# Patient Record
Sex: Female | Born: 1935 | Race: White | Hispanic: No | Smoking: Former smoker
Health system: Southern US, Community
[De-identification: ages and names within clinical notes are randomized; demographics above are authoritative.]

## PROBLEM LIST (undated history)

## (undated) DIAGNOSIS — Z9221 Personal history of antineoplastic chemotherapy: Secondary | ICD-10-CM

## (undated) DIAGNOSIS — Z8719 Personal history of other diseases of the digestive system: Secondary | ICD-10-CM

## (undated) DIAGNOSIS — K644 Residual hemorrhoidal skin tags: Secondary | ICD-10-CM

## (undated) DIAGNOSIS — J302 Other seasonal allergic rhinitis: Secondary | ICD-10-CM

## (undated) DIAGNOSIS — C50919 Malignant neoplasm of unspecified site of unspecified female breast: Secondary | ICD-10-CM

## (undated) DIAGNOSIS — N393 Stress incontinence (female) (male): Secondary | ICD-10-CM

## (undated) DIAGNOSIS — M419 Scoliosis, unspecified: Secondary | ICD-10-CM

## (undated) DIAGNOSIS — W57XXXA Bitten or stung by nonvenomous insect and other nonvenomous arthropods, initial encounter: Secondary | ICD-10-CM

## (undated) DIAGNOSIS — K648 Other hemorrhoids: Secondary | ICD-10-CM

## (undated) DIAGNOSIS — IMO0002 Reserved for concepts with insufficient information to code with codable children: Secondary | ICD-10-CM

## (undated) DIAGNOSIS — R002 Palpitations: Secondary | ICD-10-CM

## (undated) DIAGNOSIS — E7800 Pure hypercholesterolemia, unspecified: Secondary | ICD-10-CM

## (undated) DIAGNOSIS — Z923 Personal history of irradiation: Secondary | ICD-10-CM

## (undated) DIAGNOSIS — E039 Hypothyroidism, unspecified: Secondary | ICD-10-CM

## (undated) HISTORY — PX: BOWEL RESECTION: SHX1257

## (undated) HISTORY — PX: BLADDER SUSPENSION: SHX72

## (undated) HISTORY — PX: PORT-A-CATH REMOVAL: SHX5289

## (undated) HISTORY — DX: Other hemorrhoids: K64.8

## (undated) HISTORY — PX: OTHER SURGICAL HISTORY: SHX169

## (undated) HISTORY — DX: Residual hemorrhoidal skin tags: K64.4

## (undated) HISTORY — PX: HEMORRHOID BANDING: SHX5850

## (undated) HISTORY — DX: Other seasonal allergic rhinitis: J30.2

## (undated) HISTORY — PX: COLON SURGERY: SHX602

## (undated) HISTORY — DX: Malignant neoplasm of unspecified site of unspecified female breast: C50.919

## (undated) HISTORY — DX: Palpitations: R00.2

## (undated) HISTORY — PX: EYE SURGERY: SHX253

## (undated) HISTORY — DX: Scoliosis, unspecified: M41.9

## (undated) HISTORY — DX: Pure hypercholesterolemia, unspecified: E78.00

## (undated) HISTORY — DX: Stress incontinence (female) (male): N39.3

## (undated) HISTORY — DX: Reserved for concepts with insufficient information to code with codable children: IMO0002

## (undated) HISTORY — PX: CATARACT EXTRACTION: SUR2

## (undated) HISTORY — PX: JOINT REPLACEMENT: SHX530

## (undated) HISTORY — PX: HERNIA REPAIR: SHX51

## (undated) HISTORY — DX: Personal history of other diseases of the digestive system: Z87.19

---

## 1999-02-27 HISTORY — PX: TONSILLECTOMY: SUR1361

## 2008-03-10 ENCOUNTER — Ambulatory Visit (HOSPITAL_BASED_OUTPATIENT_CLINIC_OR_DEPARTMENT_OTHER): Admission: RE | Admit: 2008-03-10 | Discharge: 2008-03-10 | Payer: Self-pay | Admitting: Internal Medicine

## 2008-03-10 ENCOUNTER — Ambulatory Visit: Payer: Self-pay | Admitting: Diagnostic Radiology

## 2008-04-15 ENCOUNTER — Ambulatory Visit (HOSPITAL_BASED_OUTPATIENT_CLINIC_OR_DEPARTMENT_OTHER): Admission: RE | Admit: 2008-04-15 | Discharge: 2008-04-16 | Payer: Self-pay | Admitting: General Surgery

## 2008-09-13 ENCOUNTER — Ambulatory Visit (HOSPITAL_BASED_OUTPATIENT_CLINIC_OR_DEPARTMENT_OTHER): Admission: RE | Admit: 2008-09-13 | Discharge: 2008-09-13 | Payer: Self-pay | Admitting: Internal Medicine

## 2008-09-13 ENCOUNTER — Ambulatory Visit: Payer: Self-pay | Admitting: Diagnostic Radiology

## 2008-10-16 ENCOUNTER — Encounter: Admission: RE | Admit: 2008-10-16 | Discharge: 2008-10-16 | Payer: Self-pay | Admitting: Internal Medicine

## 2010-02-26 DIAGNOSIS — Z9221 Personal history of antineoplastic chemotherapy: Secondary | ICD-10-CM

## 2010-02-26 DIAGNOSIS — Z923 Personal history of irradiation: Secondary | ICD-10-CM

## 2010-02-26 HISTORY — DX: Personal history of irradiation: Z92.3

## 2010-02-26 HISTORY — DX: Personal history of antineoplastic chemotherapy: Z92.21

## 2010-02-26 HISTORY — PX: BREAST LUMPECTOMY: SHX2

## 2010-03-14 ENCOUNTER — Other Ambulatory Visit: Payer: Self-pay | Admitting: Radiology

## 2010-03-14 ENCOUNTER — Encounter
Admission: RE | Admit: 2010-03-14 | Discharge: 2010-03-14 | Payer: Self-pay | Source: Home / Self Care | Attending: General Surgery | Admitting: General Surgery

## 2010-03-14 HISTORY — PX: BREAST BIOPSY: SHX20

## 2010-03-17 ENCOUNTER — Ambulatory Visit: Payer: Self-pay | Admitting: Oncology

## 2010-03-20 ENCOUNTER — Encounter
Admission: RE | Admit: 2010-03-20 | Discharge: 2010-03-20 | Payer: Self-pay | Source: Home / Self Care | Attending: General Surgery | Admitting: General Surgery

## 2010-03-22 ENCOUNTER — Other Ambulatory Visit: Payer: Self-pay | Admitting: Oncology

## 2010-03-22 DIAGNOSIS — C50919 Malignant neoplasm of unspecified site of unspecified female breast: Secondary | ICD-10-CM

## 2010-03-22 LAB — CBC WITH DIFFERENTIAL/PLATELET
BASO%: 0.7 % (ref 0.0–2.0)
Basophils Absolute: 0 10*3/uL (ref 0.0–0.1)
EOS%: 3.3 % (ref 0.0–7.0)
Eosinophils Absolute: 0.2 10*3/uL (ref 0.0–0.5)
HCT: 43.1 % (ref 34.8–46.6)
HGB: 14.6 g/dL (ref 11.6–15.9)
LYMPH%: 36.9 % (ref 14.0–49.7)
MCH: 31.6 pg (ref 25.1–34.0)
MCHC: 33.8 g/dL (ref 31.5–36.0)
MCV: 93.4 fL (ref 79.5–101.0)
MONO#: 0.5 10*3/uL (ref 0.1–0.9)
MONO%: 7.4 % (ref 0.0–14.0)
NEUT#: 3.8 10*3/uL (ref 1.5–6.5)
NEUT%: 51.7 % (ref 38.4–76.8)
Platelets: 231 10*3/uL (ref 145–400)
RBC: 4.62 10*6/uL (ref 3.70–5.45)
RDW: 13.4 % (ref 11.2–14.5)
WBC: 7.4 10*3/uL (ref 3.9–10.3)
lymph#: 2.7 10*3/uL (ref 0.9–3.3)

## 2010-03-22 LAB — COMPREHENSIVE METABOLIC PANEL
ALT: 23 U/L (ref 0–35)
AST: 22 U/L (ref 0–37)
Albumin: 4.1 g/dL (ref 3.5–5.2)
Alkaline Phosphatase: 32 U/L — ABNORMAL LOW (ref 39–117)
BUN: 20 mg/dL (ref 6–23)
CO2: 27 mEq/L (ref 19–32)
Calcium: 10 mg/dL (ref 8.4–10.5)
Chloride: 101 mEq/L (ref 96–112)
Creatinine, Ser: 0.78 mg/dL (ref 0.40–1.20)
Glucose, Bld: 78 mg/dL (ref 70–99)
Potassium: 4 mEq/L (ref 3.5–5.3)
Sodium: 140 mEq/L (ref 135–145)
Total Bilirubin: 0.8 mg/dL (ref 0.3–1.2)
Total Protein: 6.6 g/dL (ref 6.0–8.3)

## 2010-03-24 ENCOUNTER — Encounter
Admission: RE | Admit: 2010-03-24 | Discharge: 2010-03-24 | Payer: Self-pay | Source: Home / Self Care | Attending: Oncology | Admitting: Oncology

## 2010-03-24 ENCOUNTER — Other Ambulatory Visit: Payer: Self-pay | Admitting: Diagnostic Radiology

## 2010-03-27 ENCOUNTER — Ambulatory Visit (HOSPITAL_COMMUNITY)
Admission: RE | Admit: 2010-03-27 | Discharge: 2010-03-27 | Payer: Self-pay | Source: Home / Self Care | Attending: General Surgery | Admitting: General Surgery

## 2010-03-28 NOTE — Op Note (Signed)
Alice [name], Alice [name]               ACCOUNT NO.:  [id number]  MEDICAL RECORD NO.:  [id number]          PATIENT TYPE:  AMB  LOCATION:  DAY                          FACILITY:  [facility]  PHYSICIAN:  Sharlet Salina T. Hoxworth, M.D.DATE OF BIRTH:  1930/10/16  DATE OF PROCEDURE:  03/27/2010 DATE OF DISCHARGE:                              OPERATIVE REPORT   PREOPERATIVE DIAGNOSIS:  Cancer of the right breast.  POSTOPERATIVE DIAGNOSIS:  Cancer of the right breast.  SURGICAL PROCEDURE:  Placement of PowerPort subcutaneous venous port via right internal jugular vein.  SURGEON:  Lorne Skeens. Hoxworth, M.D.  ANESTHESIA:  General.  BRIEF HISTORY:  Alice [name] is a 77 year old female with a recent diagnosis of locally advanced cancer of the right breast.  She will require long-term venous access for chemotherapy.  We have recommended placement of a subcutaneous venous port.  The nature of the procedure, its indications, risks of anesthetic complications, bleeding, infection, pneumothorax, catheter displacement and malfunction have been discussed and understood.  She is now brought to the operating room for this procedure.  DESCRIPTION OF OPERATION:  The patient was brought to the operating room, placed in supine position on the operating table and IV sedation was administered.  She was carefully positioned with her arms tucked and roll between her shoulder blades.  The entire chest, both shoulders and neck were widely sterilely prepped and draped.  She received preoperative IV antibiotics.  Correct patient and procedure were verified.  I initially approached the left subclavian vein and was able to cannulate the vein with good blood return, and the wire was placed but only threaded about 6 or 7 cm, and I could not get the wire to thread past this.  I therefore elected to use the left internal jugular vein which was also cannulated without difficulty with the needle and the wire passed but again  went far enough to go in the vein but only was threading about 6 or 7 cm.  I wished to place it on the left side possibly due to patient's right-sided breast cancer, and I elected to do a small cutdown on the left cephalic vein at the deltopectoral groove. This area was anesthetized and a small incision made in the skin crease and careful dissection was carried down to the deltopectoral groove where a nice normal cephalic vein was dissected free and controlled between two 4-0 silk ties.  A venotomy was made.  The vein introducer placed and the flushed catheter introduced without difficulty.  However, it would thread about 8-10 cm and would not thread past this point. There appeared to be at least some sort of functional obstruction on the left side.  I therefore elected to go to the right side.  The right anterior neck was anesthetized and the right internal jugular vein cannulated with needle and wire without difficulty, confirmed by fluoroscopy.  Following this, the introducer was placed over the wire and the flushed catheter placed via the introducer which was stripped away and the tip of the catheter positioned in the superior vena cava by fluoroscopy.  A site on the right anterior chest wall staying  superomedially as possible was chosen and anesthetized.  A small transverse incision made and subcutaneous pocket created.  The catheter was then tunneled subcutaneously to the pocket, trimmed to length and attached to the port which was sutured to the anterior chest wall with interrupted Prolene.  Fluoroscopy again showed good position of the tip of the catheter at the SVC atrial junction.  Following this, the subcu at the port site was closed with interrupted Monocryl.  Skin incisions were closed with subcuticular Monocryl and Dermabond.  Sponge and instrument counts correct.  The catheter was accessed, flushed and aspirated easily and was left flushed with concentrated heparin.  The  patient was taken to recovery room in good condition.     Lorne Skeens. Hoxworth, M.D.     Tory Emerald  D:  03/27/2010  T:  03/27/2010  Job:  [id number]  Electronically Signed by Glenna Fellows M.D. on 03/28/2010 09:18:38 AM

## 2010-03-29 ENCOUNTER — Encounter: Payer: Self-pay | Admitting: Internal Medicine

## 2010-03-30 ENCOUNTER — Ambulatory Visit (HOSPITAL_COMMUNITY)
Admission: RE | Admit: 2010-03-30 | Discharge: 2010-03-30 | Disposition: A | Discharge status: Home / Self Care | Payer: Medicare Other | Source: Ambulatory Visit | Attending: Oncology | Admitting: Oncology

## 2010-03-30 ENCOUNTER — Encounter (HOSPITAL_COMMUNITY): Payer: Self-pay

## 2010-03-30 DIAGNOSIS — C50919 Malignant neoplasm of unspecified site of unspecified female breast: Secondary | ICD-10-CM

## 2010-03-30 DIAGNOSIS — K7689 Other specified diseases of liver: Secondary | ICD-10-CM | POA: Insufficient documentation

## 2010-03-30 LAB — GLUCOSE, CAPILLARY: Glucose-Capillary: 100 mg/dL — ABNORMAL HIGH (ref 70–99)

## 2010-03-30 MED ORDER — FLUDEOXYGLUCOSE F - 18 (FDG) INJECTION: 17.8000 | Freq: Once | INTRAVENOUS | Status: DC | PRN

## 2010-03-30 MED ORDER — IOHEXOL 300 MG/ML  SOLN
80.0000 mL | Freq: Once | INTRAMUSCULAR | Status: AC | PRN
  Administered 2010-03-30: 80 mL via INTRAVENOUS

## 2010-04-10 ENCOUNTER — Encounter (HOSPITAL_BASED_OUTPATIENT_CLINIC_OR_DEPARTMENT_OTHER): Payer: Medicare Other | Admitting: Oncology

## 2010-04-10 DIAGNOSIS — C50919 Malignant neoplasm of unspecified site of unspecified female breast: Secondary | ICD-10-CM

## 2010-04-20 ENCOUNTER — Encounter (HOSPITAL_BASED_OUTPATIENT_CLINIC_OR_DEPARTMENT_OTHER): Payer: Medicare Other | Admitting: Oncology

## 2010-04-20 DIAGNOSIS — C50419 Malignant neoplasm of upper-outer quadrant of unspecified female breast: Secondary | ICD-10-CM

## 2010-04-20 DIAGNOSIS — Z5111 Encounter for antineoplastic chemotherapy: Secondary | ICD-10-CM

## 2010-04-20 DIAGNOSIS — Z170 Estrogen receptor positive status [ER+]: Secondary | ICD-10-CM

## 2010-04-21 ENCOUNTER — Encounter (HOSPITAL_BASED_OUTPATIENT_CLINIC_OR_DEPARTMENT_OTHER): Payer: Medicare Other | Admitting: Oncology

## 2010-04-21 DIAGNOSIS — C50919 Malignant neoplasm of unspecified site of unspecified female breast: Secondary | ICD-10-CM

## 2010-04-26 ENCOUNTER — Encounter (HOSPITAL_BASED_OUTPATIENT_CLINIC_OR_DEPARTMENT_OTHER): Payer: Medicare Other | Admitting: Oncology

## 2010-04-26 ENCOUNTER — Other Ambulatory Visit: Payer: Self-pay | Admitting: Oncology

## 2010-04-26 DIAGNOSIS — C50919 Malignant neoplasm of unspecified site of unspecified female breast: Secondary | ICD-10-CM

## 2010-04-26 LAB — CBC WITH DIFFERENTIAL/PLATELET
BASO%: 0.8 % (ref 0.0–2.0)
EOS%: 12.6 % — ABNORMAL HIGH (ref 0.0–7.0)
HCT: 38.6 % (ref 34.8–46.6)
MCH: 32.1 pg (ref 25.1–34.0)
MCHC: 34.5 g/dL (ref 31.5–36.0)
NEUT%: 17.7 % — ABNORMAL LOW (ref 38.4–76.8)
RBC: 4.15 10*6/uL (ref 3.70–5.45)
RDW: 12.8 % (ref 11.2–14.5)
lymph#: 0.9 10*3/uL (ref 0.9–3.3)

## 2010-05-03 ENCOUNTER — Other Ambulatory Visit: Payer: Self-pay | Admitting: Oncology

## 2010-05-03 ENCOUNTER — Encounter (HOSPITAL_BASED_OUTPATIENT_CLINIC_OR_DEPARTMENT_OTHER): Payer: Medicare Other | Admitting: Oncology

## 2010-05-03 DIAGNOSIS — C50919 Malignant neoplasm of unspecified site of unspecified female breast: Secondary | ICD-10-CM

## 2010-05-03 DIAGNOSIS — C50419 Malignant neoplasm of upper-outer quadrant of unspecified female breast: Secondary | ICD-10-CM

## 2010-05-03 DIAGNOSIS — Z170 Estrogen receptor positive status [ER+]: Secondary | ICD-10-CM

## 2010-05-03 LAB — CBC WITH DIFFERENTIAL/PLATELET
BASO%: 0.1 % (ref 0.0–2.0)
Basophils Absolute: 0 10*3/uL (ref 0.0–0.1)
EOS%: 1.2 % (ref 0.0–7.0)
HGB: 13 g/dL (ref 11.6–15.9)
MCH: 31.4 pg (ref 25.1–34.0)
RDW: 13.1 % (ref 11.2–14.5)
WBC: 12.5 10*3/uL — ABNORMAL HIGH (ref 3.9–10.3)
lymph#: 1.7 10*3/uL (ref 0.9–3.3)

## 2010-05-04 ENCOUNTER — Encounter (HOSPITAL_BASED_OUTPATIENT_CLINIC_OR_DEPARTMENT_OTHER): Payer: Medicare Other | Admitting: Oncology

## 2010-05-04 DIAGNOSIS — Z5111 Encounter for antineoplastic chemotherapy: Secondary | ICD-10-CM

## 2010-05-04 DIAGNOSIS — C50419 Malignant neoplasm of upper-outer quadrant of unspecified female breast: Secondary | ICD-10-CM

## 2010-05-04 DIAGNOSIS — Z170 Estrogen receptor positive status [ER+]: Secondary | ICD-10-CM

## 2010-05-05 ENCOUNTER — Encounter (HOSPITAL_BASED_OUTPATIENT_CLINIC_OR_DEPARTMENT_OTHER): Payer: Medicare Other | Admitting: Oncology

## 2010-05-05 DIAGNOSIS — R5381 Other malaise: Secondary | ICD-10-CM

## 2010-05-05 DIAGNOSIS — C50419 Malignant neoplasm of upper-outer quadrant of unspecified female breast: Secondary | ICD-10-CM

## 2010-05-05 DIAGNOSIS — R110 Nausea: Secondary | ICD-10-CM

## 2010-05-11 ENCOUNTER — Encounter (HOSPITAL_BASED_OUTPATIENT_CLINIC_OR_DEPARTMENT_OTHER): Payer: Medicare Other | Admitting: Oncology

## 2010-05-11 ENCOUNTER — Other Ambulatory Visit: Payer: Self-pay | Admitting: Oncology

## 2010-05-11 DIAGNOSIS — Z5111 Encounter for antineoplastic chemotherapy: Secondary | ICD-10-CM

## 2010-05-11 DIAGNOSIS — C50919 Malignant neoplasm of unspecified site of unspecified female breast: Secondary | ICD-10-CM

## 2010-05-11 DIAGNOSIS — Z170 Estrogen receptor positive status [ER+]: Secondary | ICD-10-CM

## 2010-05-11 DIAGNOSIS — C50419 Malignant neoplasm of upper-outer quadrant of unspecified female breast: Secondary | ICD-10-CM

## 2010-05-11 LAB — CBC WITH DIFFERENTIAL/PLATELET
Basophils Absolute: 0 10*3/uL (ref 0.0–0.1)
Eosinophils Absolute: 0 10*3/uL (ref 0.0–0.5)
HGB: 12.4 g/dL (ref 11.6–15.9)
NEUT#: 0.3 10*3/uL — CL (ref 1.5–6.5)
RDW: 12.9 % (ref 11.2–14.5)
lymph#: 0.6 10*3/uL — ABNORMAL LOW (ref 0.9–3.3)

## 2010-05-18 ENCOUNTER — Encounter (HOSPITAL_BASED_OUTPATIENT_CLINIC_OR_DEPARTMENT_OTHER): Payer: Medicare Other | Admitting: Oncology

## 2010-05-18 ENCOUNTER — Other Ambulatory Visit: Payer: Self-pay | Admitting: Oncology

## 2010-05-18 DIAGNOSIS — Z170 Estrogen receptor positive status [ER+]: Secondary | ICD-10-CM

## 2010-05-18 DIAGNOSIS — Z5111 Encounter for antineoplastic chemotherapy: Secondary | ICD-10-CM

## 2010-05-18 DIAGNOSIS — C50919 Malignant neoplasm of unspecified site of unspecified female breast: Secondary | ICD-10-CM

## 2010-05-18 DIAGNOSIS — C50419 Malignant neoplasm of upper-outer quadrant of unspecified female breast: Secondary | ICD-10-CM

## 2010-05-18 LAB — CBC WITH DIFFERENTIAL/PLATELET
Basophils Absolute: 0.1 10*3/uL (ref 0.0–0.1)
Eosinophils Absolute: 0.1 10*3/uL (ref 0.0–0.5)
HCT: 34.6 % — ABNORMAL LOW (ref 34.8–46.6)
HGB: 11.9 g/dL (ref 11.6–15.9)
LYMPH%: 10.2 % — ABNORMAL LOW (ref 14.0–49.7)
MCV: 91.5 fL (ref 79.5–101.0)
MONO%: 4 % (ref 0.0–14.0)
NEUT#: 10.9 10*3/uL — ABNORMAL HIGH (ref 1.5–6.5)
Platelets: 214 10*3/uL (ref 145–400)

## 2010-05-19 ENCOUNTER — Encounter (HOSPITAL_BASED_OUTPATIENT_CLINIC_OR_DEPARTMENT_OTHER): Payer: Medicare Other | Admitting: Oncology

## 2010-05-19 DIAGNOSIS — Z170 Estrogen receptor positive status [ER+]: Secondary | ICD-10-CM

## 2010-05-19 DIAGNOSIS — C50419 Malignant neoplasm of upper-outer quadrant of unspecified female breast: Secondary | ICD-10-CM

## 2010-05-20 ENCOUNTER — Encounter (HOSPITAL_BASED_OUTPATIENT_CLINIC_OR_DEPARTMENT_OTHER): Payer: Medicare Other | Admitting: Oncology

## 2010-05-20 DIAGNOSIS — C50419 Malignant neoplasm of upper-outer quadrant of unspecified female breast: Secondary | ICD-10-CM

## 2010-05-20 DIAGNOSIS — Z170 Estrogen receptor positive status [ER+]: Secondary | ICD-10-CM

## 2010-05-21 ENCOUNTER — Encounter (HOSPITAL_BASED_OUTPATIENT_CLINIC_OR_DEPARTMENT_OTHER): Payer: Medicare Other | Admitting: Oncology

## 2010-05-21 DIAGNOSIS — C50419 Malignant neoplasm of upper-outer quadrant of unspecified female breast: Secondary | ICD-10-CM

## 2010-05-21 DIAGNOSIS — Z170 Estrogen receptor positive status [ER+]: Secondary | ICD-10-CM

## 2010-05-25 ENCOUNTER — Encounter (HOSPITAL_BASED_OUTPATIENT_CLINIC_OR_DEPARTMENT_OTHER): Payer: Medicare Other | Admitting: Oncology

## 2010-05-25 ENCOUNTER — Other Ambulatory Visit: Payer: Self-pay | Admitting: Oncology

## 2010-05-25 DIAGNOSIS — C50919 Malignant neoplasm of unspecified site of unspecified female breast: Secondary | ICD-10-CM

## 2010-05-25 DIAGNOSIS — C50419 Malignant neoplasm of upper-outer quadrant of unspecified female breast: Secondary | ICD-10-CM

## 2010-05-25 DIAGNOSIS — R5383 Other fatigue: Secondary | ICD-10-CM

## 2010-05-25 DIAGNOSIS — R110 Nausea: Secondary | ICD-10-CM

## 2010-05-25 DIAGNOSIS — R5381 Other malaise: Secondary | ICD-10-CM

## 2010-05-25 LAB — CBC WITH DIFFERENTIAL/PLATELET
Eosinophils Absolute: 0 10*3/uL (ref 0.0–0.5)
LYMPH%: 36.8 % (ref 14.0–49.7)
MCHC: 33.8 g/dL (ref 31.5–36.0)
MCV: 93.3 fL (ref 79.5–101.0)
MONO%: 3.7 % (ref 0.0–14.0)
NEUT#: 0.6 10*3/uL — ABNORMAL LOW (ref 1.5–6.5)
Platelets: 208 10*3/uL (ref 145–400)
RBC: 3.62 10*6/uL — ABNORMAL LOW (ref 3.70–5.45)

## 2010-05-31 ENCOUNTER — Encounter (HOSPITAL_BASED_OUTPATIENT_CLINIC_OR_DEPARTMENT_OTHER): Payer: Medicare Other | Admitting: Oncology

## 2010-05-31 DIAGNOSIS — C50419 Malignant neoplasm of upper-outer quadrant of unspecified female breast: Secondary | ICD-10-CM

## 2010-05-31 DIAGNOSIS — Z5111 Encounter for antineoplastic chemotherapy: Secondary | ICD-10-CM

## 2010-05-31 DIAGNOSIS — Z170 Estrogen receptor positive status [ER+]: Secondary | ICD-10-CM

## 2010-05-31 DIAGNOSIS — C50919 Malignant neoplasm of unspecified site of unspecified female breast: Secondary | ICD-10-CM

## 2010-06-01 ENCOUNTER — Other Ambulatory Visit: Payer: Self-pay | Admitting: Oncology

## 2010-06-01 ENCOUNTER — Encounter (HOSPITAL_BASED_OUTPATIENT_CLINIC_OR_DEPARTMENT_OTHER): Payer: Medicare Other | Admitting: Oncology

## 2010-06-01 DIAGNOSIS — Z5189 Encounter for other specified aftercare: Secondary | ICD-10-CM

## 2010-06-01 DIAGNOSIS — C50419 Malignant neoplasm of upper-outer quadrant of unspecified female breast: Secondary | ICD-10-CM

## 2010-06-01 DIAGNOSIS — Z170 Estrogen receptor positive status [ER+]: Secondary | ICD-10-CM

## 2010-06-01 DIAGNOSIS — C50919 Malignant neoplasm of unspecified site of unspecified female breast: Secondary | ICD-10-CM

## 2010-06-01 DIAGNOSIS — Z5111 Encounter for antineoplastic chemotherapy: Secondary | ICD-10-CM

## 2010-06-01 LAB — CBC WITH DIFFERENTIAL/PLATELET
BASO%: 3.9 % — ABNORMAL HIGH (ref 0.0–2.0)
EOS%: 2.8 % (ref 0.0–7.0)
LYMPH%: 37.4 % (ref 14.0–49.7)
MCH: 30.4 pg (ref 25.1–34.0)
MCHC: 34 g/dL (ref 31.5–36.0)
MONO#: 0.9 10*3/uL (ref 0.1–0.9)
MONO%: 35 % — ABNORMAL HIGH (ref 0.0–14.0)
Platelets: 189 10*3/uL (ref 145–400)
RBC: 3.65 10*6/uL — ABNORMAL LOW (ref 3.70–5.45)
WBC: 2.5 10*3/uL — ABNORMAL LOW (ref 3.9–10.3)
nRBC: 0 % (ref 0–0)

## 2010-06-02 ENCOUNTER — Encounter (HOSPITAL_BASED_OUTPATIENT_CLINIC_OR_DEPARTMENT_OTHER): Payer: Medicare Other | Admitting: Oncology

## 2010-06-02 ENCOUNTER — Other Ambulatory Visit: Payer: Self-pay | Admitting: Oncology

## 2010-06-02 DIAGNOSIS — Z170 Estrogen receptor positive status [ER+]: Secondary | ICD-10-CM

## 2010-06-02 DIAGNOSIS — C50419 Malignant neoplasm of upper-outer quadrant of unspecified female breast: Secondary | ICD-10-CM

## 2010-06-02 DIAGNOSIS — C50911 Malignant neoplasm of unspecified site of right female breast: Secondary | ICD-10-CM

## 2010-06-03 ENCOUNTER — Encounter: Payer: Medicare Other | Admitting: Oncology

## 2010-06-03 DIAGNOSIS — Z5189 Encounter for other specified aftercare: Secondary | ICD-10-CM

## 2010-06-08 ENCOUNTER — Encounter (HOSPITAL_BASED_OUTPATIENT_CLINIC_OR_DEPARTMENT_OTHER): Payer: Medicare Other | Admitting: Oncology

## 2010-06-08 ENCOUNTER — Other Ambulatory Visit: Payer: Self-pay | Admitting: Oncology

## 2010-06-08 DIAGNOSIS — C50919 Malignant neoplasm of unspecified site of unspecified female breast: Secondary | ICD-10-CM

## 2010-06-08 DIAGNOSIS — Z170 Estrogen receptor positive status [ER+]: Secondary | ICD-10-CM

## 2010-06-08 DIAGNOSIS — C50419 Malignant neoplasm of upper-outer quadrant of unspecified female breast: Secondary | ICD-10-CM

## 2010-06-08 DIAGNOSIS — Z5111 Encounter for antineoplastic chemotherapy: Secondary | ICD-10-CM

## 2010-06-08 LAB — CBC WITH DIFFERENTIAL/PLATELET
BASO%: 1 % (ref 0.0–2.0)
EOS%: 2.3 % (ref 0.0–7.0)
HGB: 12.8 g/dL (ref 11.6–15.9)
MCH: 30.8 pg (ref 25.1–34.0)
MCHC: 33.9 g/dL (ref 31.5–36.0)
MONO%: 16.2 % — ABNORMAL HIGH (ref 0.0–14.0)
RBC: 4.15 10*6/uL (ref 3.70–5.45)
RDW: 15.2 % — ABNORMAL HIGH (ref 11.2–14.5)
lymph#: 2.6 10*3/uL (ref 0.9–3.3)

## 2010-06-09 ENCOUNTER — Encounter (HOSPITAL_BASED_OUTPATIENT_CLINIC_OR_DEPARTMENT_OTHER): Payer: Medicare Other | Admitting: Oncology

## 2010-06-09 DIAGNOSIS — D702 Other drug-induced agranulocytosis: Secondary | ICD-10-CM

## 2010-06-09 DIAGNOSIS — C50419 Malignant neoplasm of upper-outer quadrant of unspecified female breast: Secondary | ICD-10-CM

## 2010-06-09 DIAGNOSIS — Z170 Estrogen receptor positive status [ER+]: Secondary | ICD-10-CM

## 2010-06-10 ENCOUNTER — Encounter (HOSPITAL_BASED_OUTPATIENT_CLINIC_OR_DEPARTMENT_OTHER): Payer: Medicare Other | Admitting: Oncology

## 2010-06-10 DIAGNOSIS — D702 Other drug-induced agranulocytosis: Secondary | ICD-10-CM

## 2010-06-10 DIAGNOSIS — C50419 Malignant neoplasm of upper-outer quadrant of unspecified female breast: Secondary | ICD-10-CM

## 2010-06-10 DIAGNOSIS — Z170 Estrogen receptor positive status [ER+]: Secondary | ICD-10-CM

## 2010-06-11 ENCOUNTER — Encounter (HOSPITAL_BASED_OUTPATIENT_CLINIC_OR_DEPARTMENT_OTHER): Payer: Medicare Other | Admitting: Oncology

## 2010-06-11 DIAGNOSIS — D702 Other drug-induced agranulocytosis: Secondary | ICD-10-CM

## 2010-06-12 ENCOUNTER — Ambulatory Visit
Admission: RE | Admit: 2010-06-12 | Discharge: 2010-06-12 | Disposition: A | Discharge status: Home / Self Care | Payer: Medicare Other | Source: Ambulatory Visit | Attending: Oncology | Admitting: Oncology

## 2010-06-12 DIAGNOSIS — C50911 Malignant neoplasm of unspecified site of right female breast: Secondary | ICD-10-CM

## 2010-06-12 MED ORDER — GADOBENATE DIMEGLUMINE 529 MG/ML IV SOLN
12.0000 mL | Freq: Once | INTRAVENOUS | Status: AC | PRN
  Administered 2010-06-12: 12 mL via INTRAVENOUS

## 2010-06-13 LAB — DIFFERENTIAL
Eosinophils Absolute: 0.1 10*3/uL (ref 0.0–0.7)
Eosinophils Relative: 2 % (ref 0–5)
Lymphocytes Relative: 30 % (ref 12–46)
Lymphs Abs: 2.6 10*3/uL (ref 0.7–4.0)
Monocytes Absolute: 0.4 10*3/uL (ref 0.1–1.0)

## 2010-06-13 LAB — CBC
Platelets: 206 10*3/uL (ref 150–400)
RBC: 4.42 MIL/uL (ref 3.87–5.11)
WBC: 8.7 10*3/uL (ref 4.0–10.5)

## 2010-06-13 LAB — COMPREHENSIVE METABOLIC PANEL
ALT: 18 U/L (ref 0–35)
AST: 18 U/L (ref 0–37)
Albumin: 4.1 g/dL (ref 3.5–5.2)
CO2: 28 mEq/L (ref 19–32)
Calcium: 9.7 mg/dL (ref 8.4–10.5)
Chloride: 103 mEq/L (ref 96–112)
GFR calc Af Amer: >60 mL/min (ref >60)
GFR calc non Af Amer: >60 mL/min (ref >60)
Sodium: 139 mEq/L (ref 135–145)
Total Bilirubin: 1 mg/dL (ref 0.3–1.2)

## 2010-06-14 ENCOUNTER — Other Ambulatory Visit: Payer: Self-pay | Admitting: Oncology

## 2010-06-14 ENCOUNTER — Encounter (HOSPITAL_BASED_OUTPATIENT_CLINIC_OR_DEPARTMENT_OTHER): Payer: Medicare Other | Admitting: Oncology

## 2010-06-14 DIAGNOSIS — C50419 Malignant neoplasm of upper-outer quadrant of unspecified female breast: Secondary | ICD-10-CM

## 2010-06-14 DIAGNOSIS — C50919 Malignant neoplasm of unspecified site of unspecified female breast: Secondary | ICD-10-CM

## 2010-06-14 DIAGNOSIS — Z5111 Encounter for antineoplastic chemotherapy: Secondary | ICD-10-CM

## 2010-06-14 DIAGNOSIS — Z170 Estrogen receptor positive status [ER+]: Secondary | ICD-10-CM

## 2010-06-14 LAB — CBC WITH DIFFERENTIAL/PLATELET
BASO%: 2.9 % — ABNORMAL HIGH (ref 0.0–2.0)
Basophils Absolute: 0.1 10e3/uL (ref 0.0–0.1)
EOS%: 4.5 % (ref 0.0–7.0)
Eosinophils Absolute: 0.1 10e3/uL (ref 0.0–0.5)
HCT: 34.1 % — ABNORMAL LOW (ref 34.8–46.6)
HGB: 11.5 g/dL — ABNORMAL LOW (ref 11.6–15.9)
LYMPH%: 21.7 % (ref 14.0–49.7)
MCH: 30.3 pg (ref 25.1–34.0)
MCHC: 33.7 g/dL (ref 31.5–36.0)
MCV: 89.7 fL (ref 79.5–101.0)
MONO#: 0 10e3/uL — ABNORMAL LOW (ref 0.1–0.9)
MONO%: 0.3 % (ref 0.0–14.0)
NEUT#: 2.2 10e3/uL (ref 1.5–6.5)
NEUT%: 70.6 % (ref 38.4–76.8)
Platelets: 99 10e3/uL — ABNORMAL LOW (ref 145–400)
RBC: 3.8 10e6/uL (ref 3.70–5.45)
RDW: 14.7 % — ABNORMAL HIGH (ref 11.2–14.5)
WBC: 3.1 10e3/uL — ABNORMAL LOW (ref 3.9–10.3)
lymph#: 0.7 10e3/uL — ABNORMAL LOW (ref 0.9–3.3)
nRBC: 0 % (ref 0–0)

## 2010-06-20 ENCOUNTER — Encounter (HOSPITAL_BASED_OUTPATIENT_CLINIC_OR_DEPARTMENT_OTHER): Payer: Medicare Other | Admitting: Oncology

## 2010-06-20 ENCOUNTER — Other Ambulatory Visit: Payer: Self-pay | Admitting: Oncology

## 2010-06-20 DIAGNOSIS — R110 Nausea: Secondary | ICD-10-CM

## 2010-06-20 DIAGNOSIS — C50919 Malignant neoplasm of unspecified site of unspecified female breast: Secondary | ICD-10-CM

## 2010-06-20 DIAGNOSIS — Z170 Estrogen receptor positive status [ER+]: Secondary | ICD-10-CM

## 2010-06-20 DIAGNOSIS — R5383 Other fatigue: Secondary | ICD-10-CM

## 2010-06-20 DIAGNOSIS — Z5111 Encounter for antineoplastic chemotherapy: Secondary | ICD-10-CM

## 2010-06-20 DIAGNOSIS — R1013 Epigastric pain: Secondary | ICD-10-CM

## 2010-06-20 DIAGNOSIS — C50419 Malignant neoplasm of upper-outer quadrant of unspecified female breast: Secondary | ICD-10-CM

## 2010-06-20 LAB — CBC WITH DIFFERENTIAL/PLATELET
BASO%: 3.5 % — ABNORMAL HIGH (ref 0.0–2.0)
Basophils Absolute: 0.1 10*3/uL (ref 0.0–0.1)
EOS%: 11.3 % — ABNORMAL HIGH (ref 0.0–7.0)
MCH: 30.2 pg (ref 25.1–34.0)
MCHC: 33.7 g/dL (ref 31.5–36.0)
MCV: 89.6 fL (ref 79.5–101.0)
MONO%: 18.3 % — ABNORMAL HIGH (ref 0.0–14.0)
RBC: 3.64 10*6/uL — ABNORMAL LOW (ref 3.70–5.45)
RDW: 14.7 % — ABNORMAL HIGH (ref 11.2–14.5)
lymph#: 0.7 10*3/uL — ABNORMAL LOW (ref 0.9–3.3)

## 2010-06-22 ENCOUNTER — Encounter (HOSPITAL_BASED_OUTPATIENT_CLINIC_OR_DEPARTMENT_OTHER): Payer: Medicare Other | Admitting: Oncology

## 2010-06-22 ENCOUNTER — Other Ambulatory Visit: Payer: Self-pay | Admitting: Oncology

## 2010-06-22 DIAGNOSIS — R110 Nausea: Secondary | ICD-10-CM

## 2010-06-22 DIAGNOSIS — C50919 Malignant neoplasm of unspecified site of unspecified female breast: Secondary | ICD-10-CM

## 2010-06-22 DIAGNOSIS — R5381 Other malaise: Secondary | ICD-10-CM

## 2010-06-22 DIAGNOSIS — C50419 Malignant neoplasm of upper-outer quadrant of unspecified female breast: Secondary | ICD-10-CM

## 2010-06-22 LAB — CBC WITH DIFFERENTIAL/PLATELET
BASO%: 2.9 % — ABNORMAL HIGH (ref 0.0–2.0)
LYMPH%: 43.7 % (ref 14.0–49.7)
MCHC: 33.7 g/dL (ref 31.5–36.0)
MONO#: 0.4 10*3/uL (ref 0.1–0.9)
MONO%: 22.4 % — ABNORMAL HIGH (ref 0.0–14.0)
NEUT#: 0.3 10*3/uL — CL (ref 1.5–6.5)
Platelets: 152 10*3/uL (ref 145–400)
RBC: 3.61 10*6/uL — ABNORMAL LOW (ref 3.70–5.45)
RDW: 15.4 % — ABNORMAL HIGH (ref 11.2–14.5)
WBC: 1.7 10*3/uL — ABNORMAL LOW (ref 3.9–10.3)
nRBC: 0 % (ref 0–0)

## 2010-06-26 ENCOUNTER — Encounter (HOSPITAL_BASED_OUTPATIENT_CLINIC_OR_DEPARTMENT_OTHER): Payer: Medicare Other | Admitting: Oncology

## 2010-06-26 ENCOUNTER — Ambulatory Visit (HOSPITAL_COMMUNITY)
Admission: RE | Admit: 2010-06-26 | Discharge: 2010-06-26 | Disposition: A | Discharge status: Home / Self Care | Payer: Medicare Other | Source: Ambulatory Visit | Attending: Oncology | Admitting: Oncology

## 2010-06-26 ENCOUNTER — Other Ambulatory Visit: Payer: Self-pay | Admitting: Oncology

## 2010-06-26 DIAGNOSIS — M7989 Other specified soft tissue disorders: Secondary | ICD-10-CM | POA: Insufficient documentation

## 2010-06-26 DIAGNOSIS — R5383 Other fatigue: Secondary | ICD-10-CM

## 2010-06-26 DIAGNOSIS — C50919 Malignant neoplasm of unspecified site of unspecified female breast: Secondary | ICD-10-CM

## 2010-06-26 DIAGNOSIS — R110 Nausea: Secondary | ICD-10-CM

## 2010-06-26 DIAGNOSIS — Z79899 Other long term (current) drug therapy: Secondary | ICD-10-CM | POA: Insufficient documentation

## 2010-06-26 DIAGNOSIS — C50419 Malignant neoplasm of upper-outer quadrant of unspecified female breast: Secondary | ICD-10-CM

## 2010-06-26 LAB — CBC WITH DIFFERENTIAL/PLATELET
BASO%: 1.1 % (ref 0.0–2.0)
EOS%: 9.1 % — ABNORMAL HIGH (ref 0.0–7.0)
HGB: 11.4 g/dL — ABNORMAL LOW (ref 11.6–15.9)
MCH: 30.5 pg (ref 25.1–34.0)
MCHC: 33.6 g/dL (ref 31.5–36.0)
MONO#: 0.8 10*3/uL (ref 0.1–0.9)
RDW: 15.6 % — ABNORMAL HIGH (ref 11.2–14.5)
WBC: 2.7 10*3/uL — ABNORMAL LOW (ref 3.9–10.3)
lymph#: 0.9 10*3/uL (ref 0.9–3.3)

## 2010-06-29 ENCOUNTER — Encounter (HOSPITAL_BASED_OUTPATIENT_CLINIC_OR_DEPARTMENT_OTHER): Payer: Medicare Other | Admitting: Oncology

## 2010-06-29 ENCOUNTER — Other Ambulatory Visit: Payer: Self-pay | Admitting: Oncology

## 2010-06-29 DIAGNOSIS — C50419 Malignant neoplasm of upper-outer quadrant of unspecified female breast: Secondary | ICD-10-CM

## 2010-06-29 DIAGNOSIS — Z5111 Encounter for antineoplastic chemotherapy: Secondary | ICD-10-CM

## 2010-06-29 DIAGNOSIS — Z170 Estrogen receptor positive status [ER+]: Secondary | ICD-10-CM

## 2010-06-29 DIAGNOSIS — C50919 Malignant neoplasm of unspecified site of unspecified female breast: Secondary | ICD-10-CM

## 2010-06-29 LAB — CBC WITH DIFFERENTIAL/PLATELET
Basophils Absolute: 0.1 10*3/uL (ref 0.0–0.1)
Eosinophils Absolute: 0.2 10*3/uL (ref 0.0–0.5)
HCT: 34.1 % — ABNORMAL LOW (ref 34.8–46.6)
HGB: 11.6 g/dL (ref 11.6–15.9)
MONO#: 0.8 10*3/uL (ref 0.1–0.9)
NEUT#: 1.9 10*3/uL (ref 1.5–6.5)
RDW: 15.4 % — ABNORMAL HIGH (ref 11.2–14.5)
lymph#: 1.1 10*3/uL (ref 0.9–3.3)

## 2010-06-29 LAB — COMPREHENSIVE METABOLIC PANEL
Albumin: 3.6 g/dL (ref 3.5–5.2)
Alkaline Phosphatase: 49 U/L (ref 39–117)
CO2: 30 mEq/L (ref 19–32)
Calcium: 9.5 mg/dL (ref 8.4–10.5)
Chloride: 101 mEq/L (ref 96–112)
Glucose, Bld: 121 mg/dL — ABNORMAL HIGH (ref 70–99)
Potassium: 3.7 mEq/L (ref 3.5–5.3)
Sodium: 139 mEq/L (ref 135–145)
Total Protein: 5.8 g/dL — ABNORMAL LOW (ref 6.0–8.3)

## 2010-07-06 ENCOUNTER — Encounter (HOSPITAL_BASED_OUTPATIENT_CLINIC_OR_DEPARTMENT_OTHER): Payer: Medicare Other | Admitting: Oncology

## 2010-07-06 ENCOUNTER — Other Ambulatory Visit: Payer: Self-pay | Admitting: Oncology

## 2010-07-06 DIAGNOSIS — C50919 Malignant neoplasm of unspecified site of unspecified female breast: Secondary | ICD-10-CM

## 2010-07-06 DIAGNOSIS — Z5111 Encounter for antineoplastic chemotherapy: Secondary | ICD-10-CM

## 2010-07-06 DIAGNOSIS — Z170 Estrogen receptor positive status [ER+]: Secondary | ICD-10-CM

## 2010-07-06 DIAGNOSIS — C50419 Malignant neoplasm of upper-outer quadrant of unspecified female breast: Secondary | ICD-10-CM

## 2010-07-06 LAB — CBC WITH DIFFERENTIAL/PLATELET
BASO%: 0.7 % (ref 0.0–2.0)
Eosinophils Absolute: 0.2 10*3/uL (ref 0.0–0.5)
MCV: 89.9 fL (ref 79.5–101.0)
MONO%: 6 % (ref 0.0–14.0)
NEUT#: 3.9 10*3/uL (ref 1.5–6.5)
RBC: 4.06 10*6/uL (ref 3.70–5.45)
RDW: 14.3 % (ref 11.2–14.5)
WBC: 5.5 10*3/uL (ref 3.9–10.3)

## 2010-07-11 NOTE — Op Note (Signed)
Alice [name], Alice [name]               ACCOUNT NO.:  [id number]   MEDICAL RECORD NO.:  [id number]          PATIENT TYPE:  AMB   LOCATION:  [facility]                         FACILITY:  [facility]   PHYSICIAN:  Anselm Pancoast. Weatherly, M.D.DATE OF BIRTH:  11/09/1930   DATE OF PROCEDURE:  04/15/2008  DATE OF DISCHARGE:                               OPERATIVE REPORT   PREOPERATIVE DIAGNOSIS:  Right inguinal hernia.   POSTOPERATIVE DIAGNOSIS:  Right indirect inguinal hernia.   OPERATION:  Right inguinal herniorrhaphy with mesh reinforcement.   ANESTHESIA:  General anesthesia with LOA tube with local  supplementation.   SURGEON:  Anselm Pancoast. Zachery Dakins, M.D.   ASSISTANT:  Nurse.   HISTORY:  Alice [name] is a 77 year old female who I saw in the office  approximately a month ago referred by Dr. Brunilda Payor and she has a medical  doctor, Dr. Julio Sicks.  She had seen Dr. Brunilda Payor for urinary incontinence  and bladder type symptoms and he diagnosed a right inguinal hernia and  suggested that she see a general surgeon.  She had had a previous colon  resection in 2000 for diverticulitis, I think this was done at  [place], [place], and she had also had a bladder suspension.  She  said the bladder incontinence is not corrected completely but she is  doing fairly well and has an obvious bulge that protrudes down  intermittently in the right groin.  I had discussed with her that this  is most likely an indirect hernia since it is not there at all times and  would recommend it be repaired with an open procedure since she has had  a previous midline incision for diverticulitis and also the bladder  suspension surgery.  She comes in today for the planned procedure and we  will keep her overnight as she is an independent liver even though she  said she was arranging for someone to be with her when I saw her in the  office.  She preoperatively was given antibiotics and we marked the  proper side.  I got her to stand so I  could see exactly where the  incision would be best positioned and she brought up were we going to do  it laparoscopically or not.  I again informed her why we were doing it  with an inguinal incision since she has had the previous sigmoid  colectomy and the bladder issues.  She said okay and she was taken back  to the operative suite.   PROCEDURE IN DETAIL:  The patient preoperatively was given a gram of  Ancef then positioned on the OR table.  LOA tube was placed after  induction and then the groin area was first clipped and then prepped  with Betadine solution and draped in a sterile manner.  A small kind of  transverse pediatric type incision was made.  Sharp dissection down  through the skin and subcutaneous scar and identified the external  oblique.  You could see where she obviously has got a little bulge  through the external ring area.  Of course she does not have any cord  structures and the external oblique aponeurosis was opened and then the  contents were kind of elevated.  They were separated from the  surrounding tissue and the round ligament is basically very thinned out.  I then opened the hernia sac, reduced the fatty tissue that would  intermittently come down and very carefully separated everything in case  a corner of the bladder was in the area and then sort of did a high sac  ligation after trimming the sac contents.  Next the floor was closed  with a running 2-0 Prolene starting at the symphysis pubis area kind of  reclosing everything and then going back and tying the two ends  together.  The ilioinguinal nerve had been protected.  Then I used a  little piece of mesh probably about 3 inches x 1-1/2 inch to reinforce  the floor.  This was slit laterally to go around the ilioinguinal nerve  since there is no cord structure sutured to the inguinal ligament and  then the superior flap was sutured down intermittently with 2-0 Vicryl.  There was a little component like  the early parts of a femoral hernia  that were reduced and the sutures were taking care that they go below  that when I was closing the floor.  I had used Marcaine with adrenaline  for blocking the ilioinguinal nerve all total about 24-25 mL were used  and then the external oblique was reapproximated with the 2-0 Vicryl, 3-  0 Vicryl interrupted the Scarpa's fascia, 4-0 Vicryl subcuticular and  benzoin and half inch Steri-Strips on the skin.  The patient tolerated  the procedure nicely and LOA tube removed and then taken, switched over  to the stretcher.  She will spend the night here and be discharged in  the a.m.           ______________________________  Anselm Pancoast. Zachery Dakins, M.D.     WJW/MEDQ  D:  04/15/2008  T:  04/15/2008  Job:  [id number]   cc:   Lindaann Slough, M.D.  Fax: [phone]   Jackie Plum, M.D.  Fax: [phone]

## 2010-07-13 ENCOUNTER — Encounter (HOSPITAL_BASED_OUTPATIENT_CLINIC_OR_DEPARTMENT_OTHER): Payer: Medicare Other | Admitting: Oncology

## 2010-07-13 ENCOUNTER — Other Ambulatory Visit: Payer: Self-pay | Admitting: Oncology

## 2010-07-13 DIAGNOSIS — C50919 Malignant neoplasm of unspecified site of unspecified female breast: Secondary | ICD-10-CM

## 2010-07-13 DIAGNOSIS — Z170 Estrogen receptor positive status [ER+]: Secondary | ICD-10-CM

## 2010-07-13 DIAGNOSIS — C50419 Malignant neoplasm of upper-outer quadrant of unspecified female breast: Secondary | ICD-10-CM

## 2010-07-13 DIAGNOSIS — Z5111 Encounter for antineoplastic chemotherapy: Secondary | ICD-10-CM

## 2010-07-13 DIAGNOSIS — R5381 Other malaise: Secondary | ICD-10-CM

## 2010-07-13 DIAGNOSIS — R110 Nausea: Secondary | ICD-10-CM

## 2010-07-13 LAB — CBC WITH DIFFERENTIAL/PLATELET
Basophils Absolute: 0 10*3/uL (ref 0.0–0.1)
EOS%: 5.4 % (ref 0.0–7.0)
Eosinophils Absolute: 0.3 10*3/uL (ref 0.0–0.5)
HGB: 12.1 g/dL (ref 11.6–15.9)
LYMPH%: 15.5 % (ref 14.0–49.7)
MCH: 31 pg (ref 25.1–34.0)
MCV: 89.5 fL (ref 79.5–101.0)
MONO%: 4.5 % (ref 0.0–14.0)
NEUT#: 4.5 10*3/uL (ref 1.5–6.5)
NEUT%: 74.1 % (ref 38.4–76.8)
Platelets: 192 10*3/uL (ref 145–400)
RDW: 14.2 % (ref 11.2–14.5)

## 2010-07-13 LAB — COMPREHENSIVE METABOLIC PANEL
Albumin: 3.7 g/dL (ref 3.5–5.2)
Alkaline Phosphatase: 46 U/L (ref 39–117)
BUN: 17 mg/dL (ref 6–23)
CO2: 29 mEq/L (ref 19–32)
Calcium: 9.2 mg/dL (ref 8.4–10.5)
Chloride: 101 mEq/L (ref 96–112)
Glucose, Bld: 101 mg/dL — ABNORMAL HIGH (ref 70–99)
Potassium: 3.9 mEq/L (ref 3.5–5.3)
Sodium: 137 mEq/L (ref 135–145)
Total Protein: 6 g/dL (ref 6.0–8.3)

## 2010-07-13 LAB — URINALYSIS, MICROSCOPIC - CHCC
Blood: NEGATIVE
Nitrite: NEGATIVE
Protein: <30 mg/dL
Specific Gravity, Urine: 1.03 (ref 1.003–1.035)
pH: 6 (ref 4.6–8.0)

## 2010-07-20 ENCOUNTER — Other Ambulatory Visit: Payer: Self-pay | Admitting: Oncology

## 2010-07-20 ENCOUNTER — Encounter (HOSPITAL_BASED_OUTPATIENT_CLINIC_OR_DEPARTMENT_OTHER): Payer: Medicare Other | Admitting: Oncology

## 2010-07-20 DIAGNOSIS — Z170 Estrogen receptor positive status [ER+]: Secondary | ICD-10-CM

## 2010-07-20 DIAGNOSIS — C50919 Malignant neoplasm of unspecified site of unspecified female breast: Secondary | ICD-10-CM

## 2010-07-20 DIAGNOSIS — Z5111 Encounter for antineoplastic chemotherapy: Secondary | ICD-10-CM

## 2010-07-20 DIAGNOSIS — C50419 Malignant neoplasm of upper-outer quadrant of unspecified female breast: Secondary | ICD-10-CM

## 2010-07-20 LAB — CBC WITH DIFFERENTIAL/PLATELET
BASO%: 1.2 % (ref 0.0–2.0)
LYMPH%: 27 % (ref 14.0–49.7)
MCHC: 34.7 g/dL (ref 31.5–36.0)
MONO#: 0.2 10*3/uL (ref 0.1–0.9)
MONO%: 6.2 % (ref 0.0–14.0)
Platelets: 220 10*3/uL (ref 145–400)
RBC: 3.79 10*6/uL (ref 3.70–5.45)
RDW: 14.4 % (ref 11.2–14.5)
WBC: 3.4 10*3/uL — ABNORMAL LOW (ref 3.9–10.3)
nRBC: 0 % (ref 0–0)

## 2010-07-27 ENCOUNTER — Other Ambulatory Visit: Payer: Self-pay | Admitting: Oncology

## 2010-07-27 ENCOUNTER — Encounter (HOSPITAL_BASED_OUTPATIENT_CLINIC_OR_DEPARTMENT_OTHER): Payer: Medicare Other | Admitting: Oncology

## 2010-07-27 DIAGNOSIS — Z170 Estrogen receptor positive status [ER+]: Secondary | ICD-10-CM

## 2010-07-27 DIAGNOSIS — Z5111 Encounter for antineoplastic chemotherapy: Secondary | ICD-10-CM

## 2010-07-27 DIAGNOSIS — C50419 Malignant neoplasm of upper-outer quadrant of unspecified female breast: Secondary | ICD-10-CM

## 2010-07-27 DIAGNOSIS — C50919 Malignant neoplasm of unspecified site of unspecified female breast: Secondary | ICD-10-CM

## 2010-07-27 LAB — CBC WITH DIFFERENTIAL/PLATELET
Basophils Absolute: 0 10*3/uL (ref 0.0–0.1)
Eosinophils Absolute: 0.2 10*3/uL (ref 0.0–0.5)
HGB: 10.7 g/dL — ABNORMAL LOW (ref 11.6–15.9)
MCV: 90.8 fL (ref 79.5–101.0)
MONO#: 0.2 10*3/uL (ref 0.1–0.9)
NEUT#: 1.7 10*3/uL (ref 1.5–6.5)
RBC: 3.46 10*6/uL — ABNORMAL LOW (ref 3.70–5.45)
RDW: 14.4 % (ref 11.2–14.5)
WBC: 2.9 10*3/uL — ABNORMAL LOW (ref 3.9–10.3)
lymph#: 0.9 10*3/uL (ref 0.9–3.3)
nRBC: 0 % (ref 0–0)

## 2010-07-29 ENCOUNTER — Encounter (HOSPITAL_BASED_OUTPATIENT_CLINIC_OR_DEPARTMENT_OTHER): Payer: Medicare Other | Admitting: Oncology

## 2010-07-29 DIAGNOSIS — Z170 Estrogen receptor positive status [ER+]: Secondary | ICD-10-CM

## 2010-07-29 DIAGNOSIS — C50419 Malignant neoplasm of upper-outer quadrant of unspecified female breast: Secondary | ICD-10-CM

## 2010-08-03 ENCOUNTER — Encounter (HOSPITAL_BASED_OUTPATIENT_CLINIC_OR_DEPARTMENT_OTHER): Payer: Medicare Other | Admitting: Oncology

## 2010-08-03 ENCOUNTER — Other Ambulatory Visit: Payer: Self-pay | Admitting: Oncology

## 2010-08-03 DIAGNOSIS — C50419 Malignant neoplasm of upper-outer quadrant of unspecified female breast: Secondary | ICD-10-CM

## 2010-08-03 DIAGNOSIS — Z170 Estrogen receptor positive status [ER+]: Secondary | ICD-10-CM

## 2010-08-03 DIAGNOSIS — R110 Nausea: Secondary | ICD-10-CM

## 2010-08-03 DIAGNOSIS — R5381 Other malaise: Secondary | ICD-10-CM

## 2010-08-03 DIAGNOSIS — C50919 Malignant neoplasm of unspecified site of unspecified female breast: Secondary | ICD-10-CM

## 2010-08-03 LAB — CBC WITH DIFFERENTIAL/PLATELET
BASO%: 1 % (ref 0.0–2.0)
Eosinophils Absolute: 0.2 10*3/uL (ref 0.0–0.5)
HCT: 34.5 % — ABNORMAL LOW (ref 34.8–46.6)
HGB: 11.6 g/dL (ref 11.6–15.9)
LYMPH%: 18.6 % (ref 14.0–49.7)
MONO#: 0.4 10*3/uL (ref 0.1–0.9)
NEUT#: 3.5 10*3/uL (ref 1.5–6.5)
NEUT%: 68.1 % (ref 38.4–76.8)
Platelets: 254 10*3/uL (ref 145–400)
WBC: 5.1 10*3/uL (ref 3.9–10.3)
lymph#: 0.9 10*3/uL (ref 0.9–3.3)

## 2010-08-10 ENCOUNTER — Other Ambulatory Visit: Payer: Self-pay | Admitting: Oncology

## 2010-08-10 ENCOUNTER — Encounter (HOSPITAL_BASED_OUTPATIENT_CLINIC_OR_DEPARTMENT_OTHER): Payer: Medicare Other | Admitting: Oncology

## 2010-08-10 DIAGNOSIS — C50919 Malignant neoplasm of unspecified site of unspecified female breast: Secondary | ICD-10-CM

## 2010-08-10 DIAGNOSIS — Z5111 Encounter for antineoplastic chemotherapy: Secondary | ICD-10-CM

## 2010-08-10 DIAGNOSIS — C50419 Malignant neoplasm of upper-outer quadrant of unspecified female breast: Secondary | ICD-10-CM

## 2010-08-10 DIAGNOSIS — R5381 Other malaise: Secondary | ICD-10-CM

## 2010-08-10 DIAGNOSIS — Z170 Estrogen receptor positive status [ER+]: Secondary | ICD-10-CM

## 2010-08-10 DIAGNOSIS — R110 Nausea: Secondary | ICD-10-CM

## 2010-08-10 LAB — CBC WITH DIFFERENTIAL/PLATELET
Basophils Absolute: 0 10*3/uL (ref 0.0–0.1)
EOS%: 3.3 % (ref 0.0–7.0)
Eosinophils Absolute: 0.2 10*3/uL (ref 0.0–0.5)
HCT: 35.7 % (ref 34.8–46.6)
HGB: 12 g/dL (ref 11.6–15.9)
MCH: 30.9 pg (ref 25.1–34.0)
MCV: 92 fL (ref 79.5–101.0)
NEUT#: 2.8 10*3/uL (ref 1.5–6.5)
NEUT%: 60.5 % (ref 38.4–76.8)
RDW: 14.7 % — ABNORMAL HIGH (ref 11.2–14.5)
lymph#: 1.1 10*3/uL (ref 0.9–3.3)

## 2010-08-11 ENCOUNTER — Ambulatory Visit (HOSPITAL_COMMUNITY)
Admission: RE | Admit: 2010-08-11 | Discharge: 2010-08-11 | Disposition: A | Discharge status: Home / Self Care | Payer: Medicare Other | Source: Ambulatory Visit | Attending: Oncology | Admitting: Oncology

## 2010-08-11 DIAGNOSIS — M25473 Effusion, unspecified ankle: Secondary | ICD-10-CM | POA: Insufficient documentation

## 2010-08-11 DIAGNOSIS — Z79899 Other long term (current) drug therapy: Secondary | ICD-10-CM | POA: Insufficient documentation

## 2010-08-11 DIAGNOSIS — M25476 Effusion, unspecified foot: Secondary | ICD-10-CM | POA: Insufficient documentation

## 2010-08-11 DIAGNOSIS — M7989 Other specified soft tissue disorders: Secondary | ICD-10-CM | POA: Insufficient documentation

## 2010-08-17 ENCOUNTER — Encounter (HOSPITAL_BASED_OUTPATIENT_CLINIC_OR_DEPARTMENT_OTHER): Payer: Medicare Other | Admitting: Oncology

## 2010-08-17 ENCOUNTER — Other Ambulatory Visit: Payer: Self-pay | Admitting: Oncology

## 2010-08-17 DIAGNOSIS — Z5111 Encounter for antineoplastic chemotherapy: Secondary | ICD-10-CM

## 2010-08-17 DIAGNOSIS — R110 Nausea: Secondary | ICD-10-CM

## 2010-08-17 DIAGNOSIS — R5381 Other malaise: Secondary | ICD-10-CM

## 2010-08-17 DIAGNOSIS — Z170 Estrogen receptor positive status [ER+]: Secondary | ICD-10-CM

## 2010-08-17 DIAGNOSIS — C50419 Malignant neoplasm of upper-outer quadrant of unspecified female breast: Secondary | ICD-10-CM

## 2010-08-17 DIAGNOSIS — C50919 Malignant neoplasm of unspecified site of unspecified female breast: Secondary | ICD-10-CM

## 2010-08-17 LAB — CBC WITH DIFFERENTIAL/PLATELET
Basophils Absolute: 0.1 10*3/uL (ref 0.0–0.1)
Eosinophils Absolute: 0.2 10*3/uL (ref 0.0–0.5)
HCT: 35.1 % (ref 34.8–46.6)
LYMPH%: 30.5 % (ref 14.0–49.7)
MCV: 90.9 fL (ref 79.5–101.0)
MONO#: 0.3 10*3/uL (ref 0.1–0.9)
MONO%: 6.6 % (ref 0.0–14.0)
NEUT#: 2.3 10*3/uL (ref 1.5–6.5)
NEUT%: 57.3 % (ref 38.4–76.8)
Platelets: 253 10*3/uL (ref 145–400)
WBC: 3.9 10*3/uL (ref 3.9–10.3)

## 2010-08-24 ENCOUNTER — Other Ambulatory Visit: Payer: Self-pay | Admitting: Oncology

## 2010-08-24 ENCOUNTER — Encounter (HOSPITAL_BASED_OUTPATIENT_CLINIC_OR_DEPARTMENT_OTHER): Payer: Medicare Other | Admitting: Oncology

## 2010-08-24 DIAGNOSIS — R110 Nausea: Secondary | ICD-10-CM

## 2010-08-24 DIAGNOSIS — Z170 Estrogen receptor positive status [ER+]: Secondary | ICD-10-CM

## 2010-08-24 DIAGNOSIS — C50419 Malignant neoplasm of upper-outer quadrant of unspecified female breast: Secondary | ICD-10-CM

## 2010-08-24 DIAGNOSIS — Z5111 Encounter for antineoplastic chemotherapy: Secondary | ICD-10-CM

## 2010-08-24 DIAGNOSIS — C50919 Malignant neoplasm of unspecified site of unspecified female breast: Secondary | ICD-10-CM

## 2010-08-24 DIAGNOSIS — R5383 Other fatigue: Secondary | ICD-10-CM

## 2010-08-24 LAB — CBC WITH DIFFERENTIAL/PLATELET
BASO%: 1 % (ref 0.0–2.0)
EOS%: 6.5 % (ref 0.0–7.0)
HCT: 34.8 % (ref 34.8–46.6)
LYMPH%: 32.7 % (ref 14.0–49.7)
MCH: 30.6 pg (ref 25.1–34.0)
MCHC: 33.9 g/dL (ref 31.5–36.0)
MCV: 90.2 fL (ref 79.5–101.0)
MONO#: 0.3 10*3/uL (ref 0.1–0.9)
MONO%: 9.9 % (ref 0.0–14.0)
NEUT%: 49.9 % (ref 38.4–76.8)
Platelets: 220 10*3/uL (ref 145–400)
RBC: 3.86 10*6/uL (ref 3.70–5.45)
nRBC: 0 % (ref 0–0)

## 2010-08-24 LAB — COMPREHENSIVE METABOLIC PANEL
AST: 27 U/L (ref 0–37)
Albumin: 3.8 g/dL (ref 3.5–5.2)
Alkaline Phosphatase: 36 U/L — ABNORMAL LOW (ref 39–117)
Potassium: 3.7 mEq/L (ref 3.5–5.3)
Sodium: 137 mEq/L (ref 135–145)
Total Protein: 5.9 g/dL — ABNORMAL LOW (ref 6.0–8.3)

## 2010-08-31 ENCOUNTER — Encounter (HOSPITAL_BASED_OUTPATIENT_CLINIC_OR_DEPARTMENT_OTHER): Payer: Medicare Other | Admitting: Oncology

## 2010-08-31 ENCOUNTER — Other Ambulatory Visit: Payer: Self-pay | Admitting: Oncology

## 2010-08-31 DIAGNOSIS — R5381 Other malaise: Secondary | ICD-10-CM

## 2010-08-31 DIAGNOSIS — C50419 Malignant neoplasm of upper-outer quadrant of unspecified female breast: Secondary | ICD-10-CM

## 2010-08-31 DIAGNOSIS — C50919 Malignant neoplasm of unspecified site of unspecified female breast: Secondary | ICD-10-CM

## 2010-08-31 DIAGNOSIS — Z5111 Encounter for antineoplastic chemotherapy: Secondary | ICD-10-CM

## 2010-08-31 DIAGNOSIS — R110 Nausea: Secondary | ICD-10-CM

## 2010-08-31 DIAGNOSIS — Z170 Estrogen receptor positive status [ER+]: Secondary | ICD-10-CM

## 2010-08-31 LAB — CBC WITH DIFFERENTIAL/PLATELET
Basophils Absolute: 0 10*3/uL (ref 0.0–0.1)
EOS%: 3.3 % (ref 0.0–7.0)
LYMPH%: 35.3 % (ref 14.0–49.7)
MCH: 31.2 pg (ref 25.1–34.0)
MCV: 90.2 fL (ref 79.5–101.0)
MONO%: 11.2 % (ref 0.0–14.0)
RBC: 3.78 10*6/uL (ref 3.70–5.45)
RDW: 14.6 % — ABNORMAL HIGH (ref 11.2–14.5)

## 2010-09-07 ENCOUNTER — Encounter (HOSPITAL_BASED_OUTPATIENT_CLINIC_OR_DEPARTMENT_OTHER): Payer: Medicare Other | Admitting: Oncology

## 2010-09-07 ENCOUNTER — Other Ambulatory Visit: Payer: Self-pay | Admitting: Oncology

## 2010-09-07 DIAGNOSIS — C50419 Malignant neoplasm of upper-outer quadrant of unspecified female breast: Secondary | ICD-10-CM

## 2010-09-07 DIAGNOSIS — R5383 Other fatigue: Secondary | ICD-10-CM

## 2010-09-07 DIAGNOSIS — R110 Nausea: Secondary | ICD-10-CM

## 2010-09-07 DIAGNOSIS — Z170 Estrogen receptor positive status [ER+]: Secondary | ICD-10-CM

## 2010-09-07 DIAGNOSIS — C50919 Malignant neoplasm of unspecified site of unspecified female breast: Secondary | ICD-10-CM

## 2010-09-07 LAB — CBC WITH DIFFERENTIAL/PLATELET
BASO%: 1.8 % (ref 0.0–2.0)
Eosinophils Absolute: 0.1 10*3/uL (ref 0.0–0.5)
MCHC: 34.5 g/dL (ref 31.5–36.0)
MCV: 89.6 fL (ref 79.5–101.0)
MONO%: 9.7 % (ref 0.0–14.0)
NEUT#: 1.4 10*3/uL — ABNORMAL LOW (ref 1.5–6.5)
RBC: 3.85 10*6/uL (ref 3.70–5.45)
RDW: 15 % — ABNORMAL HIGH (ref 11.2–14.5)
WBC: 2.8 10*3/uL — ABNORMAL LOW (ref 3.9–10.3)
nRBC: 0 % (ref 0–0)

## 2010-09-14 ENCOUNTER — Encounter (HOSPITAL_BASED_OUTPATIENT_CLINIC_OR_DEPARTMENT_OTHER): Payer: Medicare Other | Admitting: Oncology

## 2010-09-14 ENCOUNTER — Other Ambulatory Visit: Payer: Self-pay | Admitting: Oncology

## 2010-09-14 DIAGNOSIS — Z170 Estrogen receptor positive status [ER+]: Secondary | ICD-10-CM

## 2010-09-14 DIAGNOSIS — R5383 Other fatigue: Secondary | ICD-10-CM

## 2010-09-14 DIAGNOSIS — R110 Nausea: Secondary | ICD-10-CM

## 2010-09-14 DIAGNOSIS — C50419 Malignant neoplasm of upper-outer quadrant of unspecified female breast: Secondary | ICD-10-CM

## 2010-09-14 DIAGNOSIS — C50919 Malignant neoplasm of unspecified site of unspecified female breast: Secondary | ICD-10-CM

## 2010-09-14 LAB — CBC WITH DIFFERENTIAL/PLATELET
BASO%: 0.4 % (ref 0.0–2.0)
EOS%: 2.4 % (ref 0.0–7.0)
HCT: 36.4 % (ref 34.8–46.6)
MCH: 30.4 pg (ref 25.1–34.0)
MCHC: 33.8 g/dL (ref 31.5–36.0)
MONO#: 0.6 10*3/uL (ref 0.1–0.9)
NEUT%: 57.4 % (ref 38.4–76.8)
RBC: 4.04 10*6/uL (ref 3.70–5.45)
RDW: 15.2 % — ABNORMAL HIGH (ref 11.2–14.5)
WBC: 4.7 10*3/uL (ref 3.9–10.3)
lymph#: 1.3 10*3/uL (ref 0.9–3.3)
nRBC: 0 % (ref 0–0)

## 2010-09-15 ENCOUNTER — Ambulatory Visit (HOSPITAL_COMMUNITY)
Admission: RE | Admit: 2010-09-15 | Discharge: 2010-09-15 | Disposition: A | Discharge status: Home / Self Care | Payer: Medicare Other | Source: Ambulatory Visit | Attending: Oncology | Admitting: Oncology

## 2010-09-15 DIAGNOSIS — C50919 Malignant neoplasm of unspecified site of unspecified female breast: Secondary | ICD-10-CM

## 2010-09-15 MED ORDER — GADOBENATE DIMEGLUMINE 529 MG/ML IV SOLN
12.0000 mL | Freq: Once | INTRAVENOUS | Status: AC | PRN
  Administered 2010-09-15: 12 mL via INTRAVENOUS

## 2010-09-20 ENCOUNTER — Encounter (HOSPITAL_BASED_OUTPATIENT_CLINIC_OR_DEPARTMENT_OTHER): Payer: Medicare Other | Admitting: Oncology

## 2010-09-20 ENCOUNTER — Other Ambulatory Visit: Payer: Self-pay | Admitting: Oncology

## 2010-09-20 DIAGNOSIS — Z5111 Encounter for antineoplastic chemotherapy: Secondary | ICD-10-CM

## 2010-09-20 DIAGNOSIS — Z170 Estrogen receptor positive status [ER+]: Secondary | ICD-10-CM

## 2010-09-20 DIAGNOSIS — C50419 Malignant neoplasm of upper-outer quadrant of unspecified female breast: Secondary | ICD-10-CM

## 2010-09-20 DIAGNOSIS — C50919 Malignant neoplasm of unspecified site of unspecified female breast: Secondary | ICD-10-CM

## 2010-09-20 LAB — CBC WITH DIFFERENTIAL/PLATELET
BASO%: 0.4 % (ref 0.0–2.0)
EOS%: 2.6 % (ref 0.0–7.0)
MCH: 30.6 pg (ref 25.1–34.0)
MCHC: 34 g/dL (ref 31.5–36.0)
MCV: 90.2 fL (ref 79.5–101.0)
MONO%: 12.4 % (ref 0.0–14.0)
RBC: 4.18 10*6/uL (ref 3.70–5.45)
RDW: 15.3 % — ABNORMAL HIGH (ref 11.2–14.5)
lymph#: 1.5 10*3/uL (ref 0.9–3.3)

## 2010-09-20 LAB — COMPREHENSIVE METABOLIC PANEL
ALT: 21 U/L (ref 0–35)
AST: 18 U/L (ref 0–37)
Albumin: 4.2 g/dL (ref 3.5–5.2)
Alkaline Phosphatase: 36 U/L — ABNORMAL LOW (ref 39–117)
BUN: 20 mg/dL (ref 6–23)
Calcium: 9.9 mg/dL (ref 8.4–10.5)
Chloride: 103 mEq/L (ref 96–112)
Potassium: 3.9 mEq/L (ref 3.5–5.3)

## 2010-09-22 ENCOUNTER — Other Ambulatory Visit (INDEPENDENT_AMBULATORY_CARE_PROVIDER_SITE_OTHER): Payer: Self-pay | Admitting: General Surgery

## 2010-09-22 ENCOUNTER — Encounter (INDEPENDENT_AMBULATORY_CARE_PROVIDER_SITE_OTHER): Payer: Self-pay | Admitting: General Surgery

## 2010-09-22 ENCOUNTER — Ambulatory Visit (INDEPENDENT_AMBULATORY_CARE_PROVIDER_SITE_OTHER): Payer: Medicare Other | Admitting: General Surgery

## 2010-09-22 VITALS — BP 146/88 | HR 72 | Temp 97.8°F

## 2010-09-22 DIAGNOSIS — C50911 Malignant neoplasm of unspecified site of right female breast: Secondary | ICD-10-CM

## 2010-09-22 DIAGNOSIS — C50919 Malignant neoplasm of unspecified site of unspecified female breast: Secondary | ICD-10-CM

## 2010-09-22 DIAGNOSIS — C50419 Malignant neoplasm of upper-outer quadrant of unspecified female breast: Secondary | ICD-10-CM

## 2010-09-22 NOTE — Patient Instructions (Signed)
Call for questions or concerns preoperatively.

## 2010-09-22 NOTE — Progress Notes (Signed)
Patient returns to the office for further treatment planning of her locally advanced cancer of the right breast. She initially presented in January of this year with an approximately 4 cm mass in the right breast as well as a somewhat enlarged axillary lymph node. Biopsy confirmed a grade 3 ER/PR positive HER-2 negative for mammary carcinoma. The biopsy of her lymph node was negative for cancer .  She has completed neoadjuvant chemotherapy . She has had an excellent clinical response. I have reviewed her MRI just completed and this shows no evidence of her original cancer. He slightly enlarged axillary lymph node has shrunk slightly.  Physical exam: She has alopecia. Lymph nodes: No palpable supraclavicular or axillary nodes. HEENT: Port-A-Cath in place on the right side Breasts: I cannot feel any masses in either breast with attention to the upper outer quadrant of the right breast.  Assessment and plan: Locally advanced cancer of the right breast with excellent response to neoadjuvant chemotherapy. She did have a questionable lymph node in the axilla but biopsy pretreatment was negative. I have therefore recommended proceeding with needle localized right breast lumpectomy and right axillary sentinel lymph node biopsy. I would recommend axillary dissection if the touch prep of her lymph node were positive. We will also remove her Port-A-Cath. We discussed the nature of the procedure, expected recovery, and possible complications of bleeding, infection, lymphedema. Schedule this in the near future at her convenience.

## 2010-10-09 ENCOUNTER — Encounter (HOSPITAL_BASED_OUTPATIENT_CLINIC_OR_DEPARTMENT_OTHER)
Admission: RE | Admit: 2010-10-09 | Discharge: 2010-10-09 | Disposition: A | Discharge status: Home / Self Care | Payer: Medicare Other | Source: Ambulatory Visit | Attending: General Surgery | Admitting: General Surgery

## 2010-10-11 ENCOUNTER — Ambulatory Visit
Admission: RE | Admit: 2010-10-11 | Discharge: 2010-10-11 | Disposition: A | Discharge status: Home / Self Care | Payer: Medicare Other | Source: Ambulatory Visit | Attending: General Surgery | Admitting: General Surgery

## 2010-10-11 ENCOUNTER — Ambulatory Visit (HOSPITAL_COMMUNITY)
Admission: RE | Admit: 2010-10-11 | Discharge: 2010-10-11 | Disposition: A | Discharge status: Home / Self Care | Payer: Medicare Other | Source: Ambulatory Visit | Attending: General Surgery | Admitting: General Surgery

## 2010-10-11 ENCOUNTER — Ambulatory Visit (HOSPITAL_BASED_OUTPATIENT_CLINIC_OR_DEPARTMENT_OTHER)
Admission: RE | Admit: 2010-10-11 | Discharge: 2010-10-11 | Disposition: A | Discharge status: Home / Self Care | Payer: Medicare Other | Source: Ambulatory Visit | Attending: General Surgery | Admitting: General Surgery

## 2010-10-11 ENCOUNTER — Other Ambulatory Visit (INDEPENDENT_AMBULATORY_CARE_PROVIDER_SITE_OTHER): Payer: Self-pay | Admitting: General Surgery

## 2010-10-11 DIAGNOSIS — C50919 Malignant neoplasm of unspecified site of unspecified female breast: Secondary | ICD-10-CM

## 2010-10-11 DIAGNOSIS — Z01812 Encounter for preprocedural laboratory examination: Secondary | ICD-10-CM | POA: Insufficient documentation

## 2010-10-11 DIAGNOSIS — Z452 Encounter for adjustment and management of vascular access device: Secondary | ICD-10-CM

## 2010-10-11 DIAGNOSIS — Z01810 Encounter for preprocedural cardiovascular examination: Secondary | ICD-10-CM | POA: Insufficient documentation

## 2010-10-11 DIAGNOSIS — C50911 Malignant neoplasm of unspecified site of right female breast: Secondary | ICD-10-CM

## 2010-10-11 MED ORDER — TECHNETIUM TC 99M SULFUR COLLOID FILTERED
1.0000 | Freq: Once | INTRAVENOUS | Status: AC | PRN
  Administered 2010-10-11: 1 via INTRADERMAL

## 2010-10-12 LAB — POCT HEMOGLOBIN-HEMACUE: Hemoglobin: 14.9 g/dL (ref 12.0–15.0)

## 2010-10-20 ENCOUNTER — Telehealth (INDEPENDENT_AMBULATORY_CARE_PROVIDER_SITE_OTHER): Payer: Self-pay | Admitting: General Surgery

## 2010-10-20 NOTE — Telephone Encounter (Signed)
Called the patient with path report. No answer.

## 2010-10-23 NOTE — Op Note (Signed)
Alice [name], Alice [name]               ACCOUNT NO.:  [id number]  MEDICAL RECORD NO.:  [id number]  LOCATION:  NUC                          FACILITY:  [facility]  PHYSICIAN:  Sharlet Salina T. Corliss Coggeshall, M.D.DATE OF BIRTH:  09/15/1930  DATE OF PROCEDURE:  10/11/2010 DATE OF DISCHARGE:                              OPERATIVE REPORT   PREOPERATIVE DIAGNOSIS:  Cancer of the right breast.  POSTOPERATIVE DIAGNOSIS:  Cancer of the right breast.  SURGICAL PROCEDURES: 1. Right axillary sentinel lymph node biopsy. 2. Needle-localized right breast lumpectomy. 3. Removal of Port-A-Cath.  SURGEON:  Lorne Skeens. Toby Ayad, MD  ANESTHESIA:  General.  BRIEF HISTORY:  The patient is a 77 year old female who presented in January this year with a 4-cm mass in the lateral right breast and a somewhat enlarged right axillary lymph node.  Biopsy revealed grade 3 ER/PR positive, HER2 negative mammary carcinoma and biopsy of the lymph node was negative.  She underwent neoadjuvant chemotherapy with dramatic clinical response with essentially resolution of her palpable mass and MRI findings.  The right axillary node shrunk slightly during treatment. We have now recommended proceeding with surgical therapy with right axillary sentinel lymph node biopsy and possible axillary dissection, needle-localized right breast lumpectomy and removal of her Port-A-Cath, which she desires.  We discussed the indications, procedure, nature, risks of bleeding, infection, possible need for further surgery based on final pathology findings and risks of lymphedema, and anesthetic complications.  She is now brought to the operating room for this procedure.  Following successful needle localization and following injection of 1 mCi of technetium sulfur colloid around the nipple about an hour preoperatively, the patient was brought to the operating room and placed in supine position on the operating room table and general anesthesia was  induced.  The entire right chest, upper arm, and axilla were widely, sterilely prepped and draped.  Correct patient and procedure were verified.  The NeoProbe was used to identify definite hot area of the right axilla.  A small transverse incision was made and dissection carried down through the subcutaneous tissue using cautery.  The axilla was carefully entered with blunt dissection.  Very superficially in the axilla, the NeoProbe identified a small very hot lymph node, was completely excised with cautery.  Ex vivo of the node had counts of almost 4000 with background in the axilla of less than 20.  This was sent for frozen section as right axillary sentinel lymph node.  While waiting for this report, we proceeded with the lumpectomy.  The wire insertion site was posterolateral.  I made a curvilinear incision somewhat more anterior to the wire insertion.  Dissection was carried down to the subcutaneous tissue.  The wire was brought into the incision superficial to the breast capsule.  I then excised a generous specimenof tissue around the shaft of the wire with cautery.  There was a subtle palpable abnormality around the wire that was completely excised. Specimen myography was obtained, which showed the clip and the wire in the center of the specimen.  This was oriented with ink and sent for permanent pathology.  The report of the sentinel lymph node returned showing no viable cancer cells.  There was  some scarring possible from biopsy or treatment effect.  The port was removed using the old incision dissecting down to the port, which was sharply dissected away from soft tissue, and the port and catheter were removed intact.  All incisions were infiltrated with Marcaine with epinephrine.  Subcutaneous deep breast tissues were closed with interrupted Monocryl and skin incisions were closed with subcuticular Monocryl and Dermabond.  Sponge and needle counts were correct.  The patient was  taken to the recovery room in good condition.     Lorne Skeens. Reilley Latorre, M.D.     Tory Emerald  D:  10/11/2010  T:  10/12/2010  Job:  [id number]  Electronically Signed by Glenna Fellows M.D. on 10/23/2010 11:23:26 AM

## 2010-11-01 ENCOUNTER — Encounter (HOSPITAL_BASED_OUTPATIENT_CLINIC_OR_DEPARTMENT_OTHER): Payer: Medicare Other | Admitting: Oncology

## 2010-11-01 ENCOUNTER — Ambulatory Visit
Admission: RE | Admit: 2010-11-01 | Discharge: 2010-11-01 | Disposition: A | Discharge status: Home / Self Care | Payer: Medicare Other | Source: Ambulatory Visit | Attending: Radiation Oncology | Admitting: Radiation Oncology

## 2010-11-01 DIAGNOSIS — C50419 Malignant neoplasm of upper-outer quadrant of unspecified female breast: Secondary | ICD-10-CM

## 2010-11-01 DIAGNOSIS — Z5111 Encounter for antineoplastic chemotherapy: Secondary | ICD-10-CM

## 2010-11-01 DIAGNOSIS — H04209 Unspecified epiphora, unspecified lacrimal gland: Secondary | ICD-10-CM | POA: Insufficient documentation

## 2010-11-01 DIAGNOSIS — Z510 Encounter for antineoplastic radiation therapy: Secondary | ICD-10-CM | POA: Insufficient documentation

## 2010-11-01 DIAGNOSIS — C50919 Malignant neoplasm of unspecified site of unspecified female breast: Secondary | ICD-10-CM

## 2010-11-01 DIAGNOSIS — Z170 Estrogen receptor positive status [ER+]: Secondary | ICD-10-CM

## 2010-11-01 LAB — CBC WITH DIFFERENTIAL/PLATELET
BASO%: 0.4 % (ref 0.0–2.0)
LYMPH%: 34.7 % (ref 14.0–49.7)
MCHC: 33.8 g/dL (ref 31.5–36.0)
MONO#: 0.4 10*3/uL (ref 0.1–0.9)
RBC: 4.27 10*6/uL (ref 3.70–5.45)
RDW: 13.8 % (ref 11.2–14.5)
WBC: 5.3 10*3/uL (ref 3.9–10.3)
lymph#: 1.8 10*3/uL (ref 0.9–3.3)

## 2010-11-02 ENCOUNTER — Ambulatory Visit (INDEPENDENT_AMBULATORY_CARE_PROVIDER_SITE_OTHER): Payer: Medicare Other | Admitting: General Surgery

## 2010-11-02 ENCOUNTER — Encounter (INDEPENDENT_AMBULATORY_CARE_PROVIDER_SITE_OTHER): Payer: Self-pay | Admitting: General Surgery

## 2010-11-02 VITALS — BP 142/78 | HR 72 | Temp 96.8°F

## 2010-11-02 DIAGNOSIS — Z9889 Other specified postprocedural states: Secondary | ICD-10-CM

## 2010-11-02 NOTE — Progress Notes (Signed)
The patient returns following lumpectomy and sentinel lymph node biopsy for locally advanced cancer of the breast status post neoadjuvant chemotherapy. She is doing well with no postoperative complaints.  Her wounds are all healing nicely without complication.  We had previously discussed her pathology which showed complete pathologic response. She has seen Dr. Michell Heinrich and postoperative radiation is planned. She is doing very well and I will plan to see her for long-term followup in 6 months.

## 2010-11-02 NOTE — Patient Instructions (Signed)
Call for questions or concerns.

## 2011-01-01 ENCOUNTER — Other Ambulatory Visit (HOSPITAL_BASED_OUTPATIENT_CLINIC_OR_DEPARTMENT_OTHER): Payer: Medicare Other | Admitting: Lab

## 2011-01-01 ENCOUNTER — Ambulatory Visit (HOSPITAL_BASED_OUTPATIENT_CLINIC_OR_DEPARTMENT_OTHER): Payer: Medicare Other | Admitting: Oncology

## 2011-01-01 ENCOUNTER — Other Ambulatory Visit: Payer: Self-pay | Admitting: Oncology

## 2011-01-01 VITALS — BP 149/83 | HR 79 | Temp 97.7°F | Ht 61.0 in

## 2011-01-01 DIAGNOSIS — Z170 Estrogen receptor positive status [ER+]: Secondary | ICD-10-CM

## 2011-01-01 DIAGNOSIS — C50919 Malignant neoplasm of unspecified site of unspecified female breast: Secondary | ICD-10-CM

## 2011-01-01 DIAGNOSIS — R52 Pain, unspecified: Secondary | ICD-10-CM

## 2011-01-01 DIAGNOSIS — C50419 Malignant neoplasm of upper-outer quadrant of unspecified female breast: Secondary | ICD-10-CM

## 2011-01-01 DIAGNOSIS — Z5111 Encounter for antineoplastic chemotherapy: Secondary | ICD-10-CM

## 2011-01-01 LAB — CBC WITH DIFFERENTIAL/PLATELET
BASO%: 0.5 % (ref 0.0–2.0)
HCT: 39.9 % (ref 34.8–46.6)
HGB: 13.6 g/dL (ref 11.6–15.9)
MCHC: 34.1 g/dL (ref 31.5–36.0)
MONO#: 0.3 10*3/uL (ref 0.1–0.9)
NEUT%: 60.6 % (ref 38.4–76.8)
WBC: 4.5 10*3/uL (ref 3.9–10.3)
lymph#: 1.3 10*3/uL (ref 0.9–3.3)

## 2011-01-01 LAB — COMPREHENSIVE METABOLIC PANEL
ALT: 24 U/L (ref 0–35)
CO2: 30 mEq/L (ref 19–32)
Calcium: 9.9 mg/dL (ref 8.4–10.5)
Chloride: 102 mEq/L (ref 96–112)
Creatinine, Ser: 0.68 mg/dL (ref 0.50–1.10)
Sodium: 142 mEq/L (ref 135–145)
Total Protein: 6.4 g/dL (ref 6.0–8.3)

## 2011-01-01 NOTE — Progress Notes (Signed)
ID: Alice [name]  CC:  Chief Complaint  Patient presents with  . Breast Cancer    Interval History: Alice [name] returns for followup of her breast cancer. She met with Dr. Michell Heinrich recently and discussed her aches and pains and was reassured that these were not due to radiation. There certainly not due to on the estrogens because she hasn't started those yet she saw Delynn Flavin and tells me she had a full exam and was reassured that there was nothing structurally wrong with her legs or hips and he prescribed Mobic which she took a few times but stopped because it made her too sleepy. She then got herself a dog a toy jack carrier [employer] makes called Maximilian. That may have helped or perhaps the fact that she walks the dog 45 minutes in the morning and 30 in the evening is helping at any rate the bony aches are much better. She still has discomfort in the right leg all the way to the ankle. She takes Naprosyn for this and wonders if she could switch to something that would upset her stomach a little bit less otherwise the Endo history is generally unremarkable and she is planning to spend the holidays with friends. I should add her son has just undergone stomach stapling and has already lost 50 pounds  ROS: She describes herself as mildly fatigued and somewhat sleepy from the Mobic but now that she is not taking that as she is less fatigued. She has tried various other of the medicines for pain including lorazepam and of course that can make her sleepy as well. She continues to have problems with dry eyes and continues on the TobraDex eyedrops which are helping some just some ringing in her ear switches long-standing urinary leakage and of course her following the surgery she has not noticed any changes however otherwise the detailed review of systems was non-contributory right breast is now fairly lumpy  Medications: I have reviewed the patient's current medications.  Prior to Admission:    Medications Prior to Admission  Medication Sig Dispense Refill  . fish oil-omega-3 fatty acids 1000 MG capsule Take 2 g by mouth daily.        Marland Kitchen levothyroxine (SYNTHROID) 100 MCG tablet Take 100 mcg by mouth daily.        Marland Kitchen LORazepam (ATIVAN PO) Take by mouth daily.        . Multiple Vitamin (MULTIVITAMIN PO) Take by mouth daily.        . Naproxen Sodium (ALEVE PO) Take by mouth as needed.         No current facility-administered medications on file as of 01/01/2011.     Objective: Vital signs in last 24 hours: BP 149/83  Pulse 79  Temp(Src) 97.7 F (36.5 C) (Oral)  Ht 5\' 1"  (1.549 m)   Physical Exam:    Sclerae unicteric  Oropharynx clear  No peripheral adenopathy  Lungs clear -- no rales or rhonchi  Heart regular rate and rhythm  Abdomen benign  MSK no focal spinal tenderness, no peripheral edema  Neuro nonfocal  Breast exam: The right breast is status post lumpectomy and radiation and smaller than the left breast there is some lumpiness around the scars but no suggestion of disease recurrence. Left breast no suspicious findings  Lab Results:   BMET    Component Value Date/Time   NA 141 09/20/2010 0953   NA 141 09/20/2010 0953   K 3.9 09/20/2010 0953   K 3.9  09/20/2010 0953   CL 103 09/20/2010 0953   CL 103 09/20/2010 0953   CO2 28 09/20/2010 0953   CO2 28 09/20/2010 0953   GLUCOSE 88 09/20/2010 0953   GLUCOSE 88 09/20/2010 0953   BUN 20 09/20/2010 0953   BUN 20 09/20/2010 0953   CREATININE 0.70 09/20/2010 0953   CREATININE 0.70 09/20/2010 0953   CALCIUM 9.9 09/20/2010 0953   CALCIUM 9.9 09/20/2010 0953   GFRNONAA >60 04/15/2008 1230   GFRAA  Value: >60        The eGFR has been calculated using the MDRD equation. This calculation has not been validated in all clinical situations. eGFR's persistently <60 mL/min signify possible Chronic Kidney Disease. 04/15/2008 1230     CMP     Component Value Date/Time   NA 141 09/20/2010 0953   NA 141 09/20/2010 0953   K 3.9 09/20/2010  0953   K 3.9 09/20/2010 0953   CL 103 09/20/2010 0953   CL 103 09/20/2010 0953   CO2 28 09/20/2010 0953   CO2 28 09/20/2010 0953   GLUCOSE 88 09/20/2010 0953   GLUCOSE 88 09/20/2010 0953   BUN 20 09/20/2010 0953   BUN 20 09/20/2010 0953   CREATININE 0.70 09/20/2010 0953   CREATININE 0.70 09/20/2010 0953   CALCIUM 9.9 09/20/2010 0953   CALCIUM 9.9 09/20/2010 0953   PROT 6.1 09/20/2010 0953   PROT 6.1 09/20/2010 0953   ALBUMIN 4.2 09/20/2010 0953   ALBUMIN 4.2 09/20/2010 0953   AST 18 09/20/2010 0953   AST 18 09/20/2010 0953   ALT 21 09/20/2010 0953   ALT 21 09/20/2010 0953   ALKPHOS 36* 09/20/2010 0953   ALKPHOS 36* 09/20/2010 0953   BILITOT 0.4 09/20/2010 0953   BILITOT 0.4 09/20/2010 0953   GFRNONAA >60 04/15/2008 1230   GFRAA  Value: >60        The eGFR has been calculated using the MDRD equation. This calculation has not been validated in all clinical situations. eGFR's persistently <60 mL/min signify possible Chronic Kidney Disease. 04/15/2008 1230    CBC    Component Value Date/Time   WBC 8.7 04/15/2008 1230   RBC 4.42 04/15/2008 1230   HGB 13.6 01/01/2011 1454   HGB 14.9 10/11/2010 1046   HCT 39.9 01/01/2011 1454   HCT 41.6 04/15/2008 1230   PLT 221 01/01/2011 1454   PLT 206 04/15/2008 1230   MCV 92.2 01/01/2011 1454   MCV 94.2 04/15/2008 1230   MCH 31.6 03/22/2010 0822   MCHC 33.9 04/15/2008 1230   RDW 13.9 04/15/2008 1230   LYMPHSABS 2.6 04/15/2008 1230   MONOABS 0.4 04/15/2008 1230   EOSABS 0.1 01/01/2011 1454   EOSABS 0.1 04/15/2008 1230   BASOSABS 0.0 01/01/2011 1454   BASOSABS 0.0 04/15/2008 1230        Studies/Results: She had a CT of the chest February of this year which showed no metastatic disease there was a subcentimeter focus of groundglass opacity in the left lower lobe which will warrant followup within the next year. PET scan February of this year was positive only at the primary site  Assessment: 77 year old [place] woman status post right breast biopsy January of 2012 for an  invasive ductal carcinoma grade 3 measuring 3.8 cm by MRI with a suspicious right axillary lymph node which was negative on biopsy. The tumor was estrogen and progesterone receptor positive, HER-2 negative, with an MIB-1 of 50%. She was treated with neoadjuvant [place] dose dense doxorubicin and cyclophosphamide x4  followed by weekly paclitaxel x12 completed July of 2012. She had her definitive right lumpectomy and sentinel lymph node sampling August of 2012 showing a complete pathologic response. She has now completed radiation treatments and is ready to proceed to antiestrogen therapy   Plan: We had a long discussion regarding the difference between tamoxifen and the aromatase inhibitors. She would like very much to start with tamoxifen of she took hormone therapy previously with no clotting issues she does have a uterus in place and she will need to be referred to a gynecologist sometime next year after she has shown a tolerance for this medication. She will see me again in 4 months and we will repeat lab work before that visit of course she knows to call for any problems that may develop before then  As far as her pain is concerned I wrote her for tramadol 80 mg to take 4 times a day as needed she will continue to use her Naprosyn on an as-needed basis.  MAGRINAT,GUSTAV C 01/01/2011

## 2011-01-22 ENCOUNTER — Ambulatory Visit (HOSPITAL_BASED_OUTPATIENT_CLINIC_OR_DEPARTMENT_OTHER): Payer: Medicare Other | Admitting: Oncology

## 2011-01-22 ENCOUNTER — Ambulatory Visit (HOSPITAL_COMMUNITY)
Admission: RE | Admit: 2011-01-22 | Discharge: 2011-01-22 | Disposition: A | Discharge status: Home / Self Care | Payer: Medicare Other | Source: Ambulatory Visit | Attending: Oncology | Admitting: Oncology

## 2011-01-22 ENCOUNTER — Telehealth: Payer: Self-pay | Admitting: *Deleted

## 2011-01-22 ENCOUNTER — Other Ambulatory Visit: Payer: Self-pay | Admitting: Oncology

## 2011-01-22 VITALS — BP 155/85 | HR 79 | Temp 98.2°F | Ht 61.0 in | Wt 130.0 lb

## 2011-01-22 DIAGNOSIS — C801 Malignant (primary) neoplasm, unspecified: Secondary | ICD-10-CM

## 2011-01-22 DIAGNOSIS — C50919 Malignant neoplasm of unspecified site of unspecified female breast: Secondary | ICD-10-CM

## 2011-01-22 DIAGNOSIS — I771 Stricture of artery: Secondary | ICD-10-CM | POA: Insufficient documentation

## 2011-01-22 DIAGNOSIS — R071 Chest pain on breathing: Secondary | ICD-10-CM

## 2011-01-22 DIAGNOSIS — Z170 Estrogen receptor positive status [ER+]: Secondary | ICD-10-CM

## 2011-01-22 NOTE — Telephone Encounter (Signed)
GAVE PATIENT APPOINTMENT FOR MAMMOGRAM ON 03-29-2011 AT [facility]

## 2011-01-22 NOTE — Telephone Encounter (Signed)
Call received from pt with noted concerns.  1. Onset of peripheral neuropathy.  2. Pain in upper R breast - area of surgery  Per discussion Magdalyn inquired if neuropathy was caused by Tamoxfen. Note pt states she had same neuropathy with chemo but symptoms had subsided. This RN informed pt above related to latent onset post chemo and or exerbated by other medications or activities.  A[place] will monitor above.  Pain in breast occurred over the weekend ( 5am Saturday ) while patient was in [place]. Then became more consistant while driving back from [place]. Pain has continued- is noted as fairly consistant.  Area is tender and warm to touch. Tekeya has not looked at area but friend has who states area as having a " ridge " and " red but not inflammed " [place] also states bra aggravates area but this is same bra she has been wearing ( relating to cause ).  This RN informed pt above concerns would be reveiwed with  MD for appropriate recommendations.

## 2011-01-22 NOTE — Progress Notes (Signed)
ID: Alice [name]   Interval History: Alice [name] returns today for a non-scheduled visit, complaining of pain in her lateral right chest wall. Since the last visit here she has gone to [place] and and [place]. She is also walking her dog Alice [name] ("with one L") at least twice a day, and he does up whole of her right arm even though he only weighs about 13 pounds.  ROS:  The pain started about 3 days ago. It localizes to the area under her right breast and then continues laterally and posteriorly into her back. There is no pleuritic component. Otherwise today a detailed review of systems was entirely noncontributory.  Medications: I have reviewed the patient's current medications.  Current Outpatient Prescriptions  Medication Sig Dispense Refill  . fish oil-omega-3 fatty acids 1000 MG capsule Take 2 g by mouth daily.        Marland Kitchen levothyroxine (SYNTHROID) 100 MCG tablet Take 100 mcg by mouth daily.        Marland Kitchen LORazepam (ATIVAN PO) Take by mouth daily.        . Multiple Vitamin (MULTIVITAMIN PO) Take by mouth daily.        . Naproxen Sodium (ALEVE PO) Take by mouth as needed.        . tamoxifen (NOLVADEX) 20 MG tablet Take 20 mg by mouth daily.        . traMADol (ULTRAM) 50 MG tablet Take 50 mg by mouth every 6 (six) hours as needed. Maximum dose= 8 tablets per day          Objective: Vital signs in last 24 hours: BP 155/85  Pulse 79  Temp(Src) 98.2 F (36.8 C) (Oral)  Ht 5\' 1"  (1.549 m)  Wt 130 lb (58.968 kg)  BMI 24.56 kg/m2   Physical Exam:    Sclerae unicteric  Oropharynx clear  No peripheral adenopathy  Lungs clear -- no rales or rhonchi  Heart regular rate and rhythm  Abdomen benign  MSK no focal spinal tenderness, no peripheral edema  Neuro nonfocal  Breast exam: Status post lumpectomy on the right, with no evidence of local recurrence. Under the breast all I can see is a slightly erythematous line where she had her bra. This blanches easily, it is not palpable. There is no  skin change whatsoever to suggest zoster or a fungal infection or other interruption. On the other hand there is focal tenderness over the ribs at the lateralmost aspect of this area. Left breast is unremarkable.  Lab Results:  CMP  No results found for this basename: NA,K,CL,CO2,BUN,CREATININE,CALCIUM,LABALBU,PROT,BILITOT,ALKPHOS,ALT,AST,GLUCOSE in the last 168 hours   CBC Lab Results  Component Value Date   WBC 4.5 01/01/2011   HGB 13.6 01/01/2011   HCT 39.9 01/01/2011   MCV 92.2 01/01/2011   PLT 221 01/01/2011    Studies/Results:     Dg Chest 2 View  01/22/2011  *RADIOLOGY REPORT*  Clinical Data: Breast carcinoma.  Currently undergoing chemotherapy and radiation therapy.  CHEST - 2 VIEW  Comparison: 03/27/2010  Findings: The right-sided power port has been removed.  Both lungs are clear.  No evidence of pneumothorax or pleural effusion.  Heart size is normal.  No mass or lymphadenopathy identified.  Mild tortuosity of the thoracic aorta remains stable.  Visualized skeletal structures are unremarkable.  IMPRESSION: No active cardiopulmonary disease.  Original Report Authenticated By: Danae Orleans, M.D.    Assessment:  77 year old [place] woman status post right breast biopsy January of 2012 for a grade 3  invasive ductal carcinoma measuring 3.8 cm by MRI, with a suspicious right axillary lymph node which was however negative to biopsy, the tumor being estrogen and progesterone receptor positive, HER-2 negative, with an MIB-1-150%; treated neoadjuvantly with dose dense doxorubicin and cyclophosphamide x4 followed by weekly paclitaxel x12 completed July of 2012, followed by definitive right lumpectomy and sentinel lymph node sampling August 2012 showing a complete pathologic response in the breast and lymph nodes, followed by radiation completed November of 2012. She started tamoxifen this month, so far with good tolerance.   Plan: I set her up for plain films of the right ribs, which is  not exactly what we.as noted above. I will discuss this with the radiologists, but at least a primary reading shows no evidence of rib fracture or involvement by cancer. It could be that she is about to develop zoster and I have asked her to call me immediately if she has any change in her skin in that area whatsoever. As far as pain is concerned, she will take nonsteroidals for now. She knows to call for any problems that may develop before the next visit.  MAGRINAT,GUSTAV C 01/22/2011

## 2011-02-01 ENCOUNTER — Ambulatory Visit
Admission: RE | Admit: 2011-02-01 | Discharge: 2011-02-01 | Disposition: A | Discharge status: Home / Self Care | Payer: Medicare Other | Source: Ambulatory Visit | Attending: Radiation Oncology | Admitting: Radiation Oncology

## 2011-02-01 DIAGNOSIS — C50419 Malignant neoplasm of upper-outer quadrant of unspecified female breast: Secondary | ICD-10-CM

## 2011-02-01 NOTE — Progress Notes (Signed)
Had spinal injection by dr.newton every 6 months. Right hip bursitis. Right breast tx 33 treatment skin unremarkable.

## 2011-02-01 NOTE — Progress Notes (Signed)
CC:   [name], MD [name], M.D.  DIAGNOSIS:  Breast cancer.  PREVIOUS RADIATION:  Radiation to the right breast to a total dose of 61 Gy completed 12/28/2010.  INTERVAL SINCE TREATMENT:  1 month.  INTERVAL HISTORY:  Alice [name] reports for followup today.  She had some rib soreness after helping her son move and lifting suitcases and walking her dog.  She was seen by Dr. Darnelle Catalan, who ordered a chest x-ray which was unremarkable.  She said she took pain pills for about 3 days and it went away and she has not been bothered by it since.  She is taking tamoxifen and has some hot flashes, but other than that is it tolerating well.  PHYSICAL EXAMINATION:  Her skin is well healed.  She has no palpable abnormalities in the area where she states the pain occurred in the lateral aspect of her chest.  Her skin is well healed with just some very mild hyperpigmentation.  IMPRESSION:  Right breast cancer, status post lumpectomy, chemotherapy, and radiation with resolving acute effects of treatment.  RECOMMENDATIONS:  [name] looks great.  She will continue to be followed by Dr. Darnelle Catalan and will continue on her tamoxifen.  I have released her from followup with me.  I would be happy to see her back at any point in the future.    ______________________________ Lurline Hare, M.D. SW/MEDQ  D:  02/01/2011  T:  02/01/2011  Job:  [id number]

## 2011-02-06 ENCOUNTER — Other Ambulatory Visit: Payer: Self-pay | Admitting: *Deleted

## 2011-02-06 DIAGNOSIS — C50919 Malignant neoplasm of unspecified site of unspecified female breast: Secondary | ICD-10-CM

## 2011-02-06 MED ORDER — TAMOXIFEN CITRATE 20 MG PO TABS: 20.0000 mg | ORAL_TABLET | Freq: Every day | ORAL | Status: DC

## 2011-02-08 ENCOUNTER — Ambulatory Visit: Payer: Medicare Other | Admitting: Radiation Oncology

## 2011-02-15 ENCOUNTER — Ambulatory Visit: Admission: RE | Admit: 2011-02-15 | Payer: Medicare Other | Source: Ambulatory Visit | Admitting: Radiation Oncology

## 2011-03-20 ENCOUNTER — Encounter: Payer: Self-pay | Admitting: Radiation Oncology

## 2011-03-20 ENCOUNTER — Telehealth: Payer: Self-pay | Admitting: *Deleted

## 2011-03-22 ENCOUNTER — Telehealth: Payer: Self-pay | Admitting: *Deleted

## 2011-03-22 ENCOUNTER — Ambulatory Visit
Admission: RE | Admit: 2011-03-22 | Discharge: 2011-03-22 | Disposition: A | Discharge status: Home / Self Care | Payer: Medicare Other | Source: Ambulatory Visit | Attending: Radiation Oncology | Admitting: Radiation Oncology

## 2011-03-22 ENCOUNTER — Encounter: Payer: Self-pay | Admitting: Radiation Oncology

## 2011-03-22 DIAGNOSIS — C50419 Malignant neoplasm of upper-outer quadrant of unspecified female breast: Secondary | ICD-10-CM

## 2011-03-22 HISTORY — DX: Hypothyroidism, unspecified: E03.9

## 2011-03-22 NOTE — Progress Notes (Signed)
CC:   Alice [name], M.D. Alice Lint, MD  DIAGNOSIS:  Right breast cancer.  PREVIOUS RADIATION:  61 Gy completed on 12/28/2010.  INTERVAL SINCE TREATMENT:  3 months.  INTERVAL HISTORY:  Alice [name] reports for followup today.  She is feeling well and doing well.  Her skin is healed up nicely.  She has noticed she has become more tearful recently.  She was tearful over Christmas and just has become more emotional, which is not normal for her.  She is signing up for the [employer] class which she is excited about and spending a lot of time with her dog.  She states that she had a bump on her left rib but could not find it this morning.  She is scheduled for mammogram next week.  She is tolerating her tamoxifen well.  PHYSICAL EXAMINATION:  She is a pleasant female in no distress sitting comfortably on the exam room table.  She has an excellent cosmetic result.  There is some very mild hyperpigmentation of the right breast. She has a palpable seroma cavity which is smooth and well marginated.  I can palpate no abnormalities of the left wrist.  IMPRESSION:  Right breast cancer status post radiation with resolving acute effects of treatment.  RECOMMENDATIONS:  Alice [name] looks great.  I have released her from follow up with me.  I will make sure she is scheduled up with Dr. Darnelle Catalan in March.  His last note in November indicated that is when he wanted to see her again.    ______________________________ Lurline Hare, M.D. SW/MEDQ  D:  03/22/2011  T:  03/22/2011  Job:  [id number]

## 2011-03-22 NOTE — Progress Notes (Signed)
HERE FOR FU OF RIGHT BREAST CA WITH NO C/O.  DOES SAY THAT SHE SEEMS TO BE MORE SENSITIVE AND CRIES EASY.  HAS NODULE ON LEFT RIB CAGE TOWARD BACK

## 2011-03-29 ENCOUNTER — Ambulatory Visit
Admission: RE | Admit: 2011-03-29 | Discharge: 2011-03-29 | Disposition: A | Discharge status: Home / Self Care | Payer: Medicare Other | Source: Ambulatory Visit | Attending: Oncology | Admitting: Oncology

## 2011-03-29 DIAGNOSIS — Z853 Personal history of malignant neoplasm of breast: Secondary | ICD-10-CM | POA: Diagnosis not present

## 2011-03-29 DIAGNOSIS — C50919 Malignant neoplasm of unspecified site of unspecified female breast: Secondary | ICD-10-CM

## 2011-04-12 ENCOUNTER — Encounter (INDEPENDENT_AMBULATORY_CARE_PROVIDER_SITE_OTHER): Payer: Self-pay | Admitting: General Surgery

## 2011-04-20 NOTE — Telephone Encounter (Signed)
xxx

## 2011-05-02 DIAGNOSIS — M76899 Other specified enthesopathies of unspecified lower limb, excluding foot: Secondary | ICD-10-CM | POA: Diagnosis not present

## 2011-05-02 DIAGNOSIS — M25559 Pain in unspecified hip: Secondary | ICD-10-CM | POA: Diagnosis not present

## 2011-05-10 ENCOUNTER — Other Ambulatory Visit: Payer: Self-pay | Admitting: Oncology

## 2011-05-10 ENCOUNTER — Ambulatory Visit (HOSPITAL_BASED_OUTPATIENT_CLINIC_OR_DEPARTMENT_OTHER): Payer: Medicare Other | Admitting: Oncology

## 2011-05-10 ENCOUNTER — Telehealth: Payer: Self-pay | Admitting: *Deleted

## 2011-05-10 VITALS — BP 179/80 | HR 71 | Temp 97.0°F | Ht 61.0 in | Wt 123.8 lb

## 2011-05-10 DIAGNOSIS — C50419 Malignant neoplasm of upper-outer quadrant of unspecified female breast: Secondary | ICD-10-CM

## 2011-05-10 NOTE — Telephone Encounter (Signed)
gave patient appointment for 10-2011 printed out calendar and gave to the patient, called dr.mann office per the physician did not see a reason to see the patient because of the patient's age and the patient has never has an colonscopy before

## 2011-05-10 NOTE — Progress Notes (Signed)
ID: Alice [name]   DOB: [birth year]  MR#: [id number]  WUJ#:[id number]  HISTORY OF PRESENT ILLNESS: Alice [name] noted a mass in her right breast late December of 2011.  She brought this to Dr. Rubye Oaks attention and he set her up for mammography at [facility] in [facility].  This showed a large spiculated mass in the posterior upper outer quadrant of the right breast which was palpable.  By ultrasound it measured 3.6 cm.  It was hypoechoic with internal blood flow.  Biopsy was suggested and performed January 17th, 2012.  The pathology from that biopsy ([id number]) showed an invasive ductal carcinoma, Grade 3, ER 95% and PR 47% positive with an MIB-1 of 50%, no HER2 amplification with a CISH ratio of 1.63.    With this information, the patient was referred to Dr. Donell Beers, and bilateral breast MRIs were obtained March 20, 2010.  This showed in the posterior 3rd of the outer right breast a 3.8 cm mass with a clip artifact and in addition in the axillary tail of the right breast, a 9 mm rounded mildly irregular mass, corresponding to an abnormal appearing lymph node seen at mammography.  There were no other areas suspicious for enhancement.     Her subsequent history is as summarized below  INTERVAL HISTORY: Alice [name] is generally doing much better, with less emotional lability. She has got herself a puppy, Alice [name], a Bertram [place] terrier makes, and they go on long walks together and do yard work together. She is going through the [employer] and enjoying it. She is reading about by Verdie Drown currently  REVIEW OF SYSTEMS: She continues to have mild back pain after this is long-standing and has not increased in intensity or frequency. Sometimes she takes a 5 mg Valium for this. She has urinary leakage. We discussed some strategies she can in the management to make this less of an issue. She remains anxious but only when she comes here she says. Otherwise a detailed review of systems is  entirely noncontributory  PAST MEDICAL HISTORY: Past Medical History  Diagnosis Date  . Cancer     breast ca  . Hypothyroidism   1. Greater than 30 pack-year tobacco abuse, the patient quitting in the sixties.  2. History of sling procedure. 3. Seasonal allergies. 4. Status post hernia repair under Dr. Zachery Dakins. 5. Status post small bowel resection for diverticular disease in 2001.  6. Status post left cataract surgery. 7. History of tonsillectomy. 8. History of palpitations.   9. History of degenerative disk disease. 10. Hypothyroidism. 11. Hypercholesterolemia. 12. Hypertension. Stress urinary incontinence  PAST SURGICAL HISTORY: Past Surgical History  Procedure Date  . Breast lumpectomy 2012    left    FAMILY HISTORY The patient's father died from a stroke at the age of 32.  The patient's mother died with congestive heart failure at the age of 59.  The patient has one sister who died with non-Hodgkin's lymphoma at the age of 71.  She has a brother who was a Clinical cytogeneticist and died from unknown causes.    GYNECOLOGIC HISTORY: She is GX P3, first pregnancy to term at age 27.  She had menarche at age 40.  She went through menopause in her fifties. 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 Son Alice [name] is present today. He lives in [place] where he works in  IT.  He is the patient's healthcare power of attorney and may be reached at [phone].  The patient's son Alice [name] also works in IT in the [place] area.  Son Alice [name] is a Advice worker [employer].  The patient has 5 grandchildren and 2 great-grandchildren. She attends [employer].      ADVANCED DIRECTIVES:  HEALTH MAINTENANCE: History  Substance Use Topics  . Smoking status: Never Smoker   . Smokeless tobacco: Not on file  . Alcohol Use: No      Colonoscopy: never  PAP: 2001  Bone density: within past 5 years  Lipid panel:  Allergies  Allergen Reactions  . Aleve Rash  . Diphenhydramine Rash    Current Outpatient Prescriptions  Medication Sig Dispense Refill  . diclofenac (FLECTOR) 1.3 % PTCH Place 1 patch onto the skin 2 (two) times daily.        . fish oil-omega-3 fatty acids 1000 MG capsule Take 2 g by mouth daily.        Marland Kitchen levothyroxine (SYNTHROID) 100 MCG tablet Take 100 mcg by mouth daily.        . Multiple Vitamin (MULTIVITAMIN PO) Take by mouth daily.        . tamoxifen (NOLVADEX) 20 MG tablet Take 1 tablet (20 mg total) by mouth daily.  90 tablet  11  . LORazepam (ATIVAN PO) Take by mouth daily.        . Naproxen Sodium (ALEVE PO) Take by mouth as needed.          OBJECTIVE: Middle-aged white woman who appears well Filed Vitals:   05/10/11 1021  BP: 179/80  Pulse: 71  Temp: 97 F (36.1 C)     Body mass index is 23.39 kg/(m^2).    ECOG FS: 0  Sclerae unicteric Oropharynx clear No peripheral adenopathy Lungs no rales or rhonchi Heart regular rate and rhythm Abd benign MSK scoliosis no focal spinal tenderness, no peripheral edema Neuro: nonfocal Breasts: Right breast status post lumpectomy and radiation, no evidence of local recurrence; left breast no suspicious finding  LAB RESULTS: Lab Results  Component Value Date   WBC 4.5 01/01/2011   NEUTROABS 2.7 01/01/2011   HGB 13.6 01/01/2011   HCT 39.9 01/01/2011   MCV 92.2 01/01/2011   PLT 221 01/01/2011      Chemistry      Component Value Date/Time   NA 142 01/01/2011 1454   K 3.4* 01/01/2011 1454   CL 102 01/01/2011 1454   CO2 30 01/01/2011 1454   BUN 16 01/01/2011 1454   CREATININE 0.68 01/01/2011 1454      Component Value Date/Time   CALCIUM 9.9 01/01/2011 1454   ALKPHOS 51 01/01/2011 1454   AST 24 01/01/2011 1454   ALT 24 01/01/2011 1454   BILITOT 0.2* 01/01/2011 1454       Lab Results  Component Value Date   LABCA2 18 03/22/2010    No  components found with this basename: [id number]    No results found for this basename: INR:1;PROTIME:1 in the last 168 hours  Urinalysis    Component Value Date/Time   LABSPEC 1.030 07/13/2010 1339       STUDIES: No new results found. Most recent mammogram 03/29/2011 was unremarkable  ASSESSMENT: 78 year old [place] woman, status post right breast biopsy March 17, 2010 for a grade 3 invasive ductal carcinoma measuring 3.8 cm by MRI with a suspicious right axillary lymph node which was, however, negative by biopsy, the tumor being estrogen  and progesterone receptor positive, HER2-negative, with an MIB-1 of 50%.  Neoadjuvantly, she was treated with dose-dense doxorubicin and cyclophosphamide x4 followed by weekly paclitaxel x 12, completed July 2012 followed by definitive right lumpectomy and sentinel lymph node sampling August 2012 showing a complete pathologic response. She completed radiation treatments November 2012 and started tamoxifen at that time  PLAN: She is tolerating the tamoxifen well, with minimal hot flashes as her only side effect. The plan is to continue this for 5 years and then consider 5 more years of tamoxifen versus 5 years of an aromatase inhibitor.  We are going to try to help get her health maintenance up-to-date and I am referring her to Dr. Loreta Ave for colonoscopy. I am less concerned about Pap smears but those will need to be updated at some point as well. She tells me her primary care physician Dr. Julio Sicks will be taking her lipid panel at their April visit.  She will see [place] again in 3 months. She knows to call for any problems that may develop before that   Wilver Tignor C    05/10/2011

## 2011-05-21 ENCOUNTER — Ambulatory Visit: Payer: Medicare Other | Attending: Orthopaedic Surgery | Admitting: Physical Therapy

## 2011-05-21 DIAGNOSIS — M4120 Other idiopathic scoliosis, site unspecified: Secondary | ICD-10-CM | POA: Insufficient documentation

## 2011-05-21 DIAGNOSIS — M545 Low back pain, unspecified: Secondary | ICD-10-CM | POA: Diagnosis not present

## 2011-05-21 DIAGNOSIS — IMO0001 Reserved for inherently not codable concepts without codable children: Secondary | ICD-10-CM | POA: Insufficient documentation

## 2011-06-01 ENCOUNTER — Encounter (INDEPENDENT_AMBULATORY_CARE_PROVIDER_SITE_OTHER): Payer: Self-pay | Admitting: General Surgery

## 2011-06-01 ENCOUNTER — Ambulatory Visit (INDEPENDENT_AMBULATORY_CARE_PROVIDER_SITE_OTHER): Payer: Medicare Other | Admitting: General Surgery

## 2011-06-01 VITALS — BP 172/98 | HR 68 | Temp 97.5°F | Resp 18 | Ht 62.0 in | Wt 123.0 lb

## 2011-06-01 DIAGNOSIS — C50419 Malignant neoplasm of upper-outer quadrant of unspecified female breast: Secondary | ICD-10-CM | POA: Diagnosis not present

## 2011-06-01 NOTE — Progress Notes (Signed)
Chief complaint: followup breast cancer  History: Patient returns for long-term followup of cancer the right breast. She presented over a year ago with locally advanced cancer of the right breast underwent neoadjuvant chemotherapy. She underwent lumpectomy and sentinel lymph node biopsy in August with no residual tumor identified. She completed radiation treatment and now is on adjuvant tamoxifen. She has had some nagging right-sided discomfort possibly related to exercise and this was evaluated by Dr. Darnelle Catalan is negative. No changes in her breast. She has been somewhat emotional or slightly depressed since her treatment but seems to be gradually improving.  Exam: Gen.: Alert in no distress Lungs: Breath sounds clear and equal Lymph nodes: No palpable cervical, subclavicular, or axillary nodes Breasts: No palpable masses in either breast with particular attention to the lumpectomy site in the right breast.  Imaging: Mammogram in January was negative.  Assessment and plan: Doing well with no evidence of early recurrence or complication for treatment. I asked her to return in 6 months.

## 2011-06-04 DIAGNOSIS — IMO0002 Reserved for concepts with insufficient information to code with codable children: Secondary | ICD-10-CM | POA: Diagnosis not present

## 2011-06-04 DIAGNOSIS — G609 Hereditary and idiopathic neuropathy, unspecified: Secondary | ICD-10-CM | POA: Diagnosis not present

## 2011-06-04 DIAGNOSIS — E119 Type 2 diabetes mellitus without complications: Secondary | ICD-10-CM | POA: Diagnosis not present

## 2011-06-04 DIAGNOSIS — I10 Essential (primary) hypertension: Secondary | ICD-10-CM | POA: Diagnosis not present

## 2011-06-04 DIAGNOSIS — E559 Vitamin D deficiency, unspecified: Secondary | ICD-10-CM | POA: Diagnosis not present

## 2011-06-04 DIAGNOSIS — F411 Generalized anxiety disorder: Secondary | ICD-10-CM | POA: Diagnosis not present

## 2011-06-04 DIAGNOSIS — E785 Hyperlipidemia, unspecified: Secondary | ICD-10-CM | POA: Diagnosis not present

## 2011-06-04 DIAGNOSIS — E039 Hypothyroidism, unspecified: Secondary | ICD-10-CM | POA: Diagnosis not present

## 2011-06-06 ENCOUNTER — Ambulatory Visit: Payer: Medicare Other | Attending: Orthopaedic Surgery | Admitting: Physical Therapy

## 2011-06-06 DIAGNOSIS — M545 Low back pain, unspecified: Secondary | ICD-10-CM | POA: Insufficient documentation

## 2011-06-06 DIAGNOSIS — IMO0001 Reserved for inherently not codable concepts without codable children: Secondary | ICD-10-CM | POA: Insufficient documentation

## 2011-06-06 DIAGNOSIS — M4120 Other idiopathic scoliosis, site unspecified: Secondary | ICD-10-CM | POA: Diagnosis not present

## 2011-06-13 ENCOUNTER — Ambulatory Visit: Payer: Medicare Other | Admitting: Physical Therapy

## 2011-06-13 DIAGNOSIS — IMO0001 Reserved for inherently not codable concepts without codable children: Secondary | ICD-10-CM | POA: Diagnosis not present

## 2011-06-13 DIAGNOSIS — M545 Low back pain: Secondary | ICD-10-CM | POA: Diagnosis not present

## 2011-06-13 DIAGNOSIS — M4120 Other idiopathic scoliosis, site unspecified: Secondary | ICD-10-CM | POA: Diagnosis not present

## 2011-06-20 ENCOUNTER — Encounter: Payer: Medicare Other | Admitting: Physical Therapy

## 2011-06-21 ENCOUNTER — Telehealth (INDEPENDENT_AMBULATORY_CARE_PROVIDER_SITE_OTHER): Payer: Self-pay

## 2011-06-21 NOTE — Telephone Encounter (Signed)
Notified Alice [name] that [employer] has issued a credit for duplicate PCR Screen & TX charge.

## 2011-06-25 ENCOUNTER — Encounter: Payer: Self-pay | Admitting: Internal Medicine

## 2011-06-26 ENCOUNTER — Encounter: Payer: Self-pay | Admitting: Radiation Oncology

## 2011-06-27 ENCOUNTER — Ambulatory Visit: Payer: Medicare Other | Attending: Orthopaedic Surgery | Admitting: Physical Therapy

## 2011-06-27 ENCOUNTER — Encounter: Payer: Self-pay | Admitting: Radiation Oncology

## 2011-06-27 DIAGNOSIS — M4120 Other idiopathic scoliosis, site unspecified: Secondary | ICD-10-CM | POA: Insufficient documentation

## 2011-06-27 DIAGNOSIS — M545 Low back pain, unspecified: Secondary | ICD-10-CM | POA: Insufficient documentation

## 2011-06-27 DIAGNOSIS — IMO0001 Reserved for inherently not codable concepts without codable children: Secondary | ICD-10-CM | POA: Insufficient documentation

## 2011-07-04 DIAGNOSIS — M76899 Other specified enthesopathies of unspecified lower limb, excluding foot: Secondary | ICD-10-CM | POA: Diagnosis not present

## 2011-07-05 ENCOUNTER — Other Ambulatory Visit: Payer: Self-pay | Admitting: Radiation Oncology

## 2011-07-05 DIAGNOSIS — C50419 Malignant neoplasm of upper-outer quadrant of unspecified female breast: Secondary | ICD-10-CM

## 2011-07-24 ENCOUNTER — Other Ambulatory Visit: Payer: Self-pay | Admitting: Oncology

## 2011-07-24 DIAGNOSIS — C801 Malignant (primary) neoplasm, unspecified: Secondary | ICD-10-CM

## 2011-07-24 DIAGNOSIS — C50419 Malignant neoplasm of upper-outer quadrant of unspecified female breast: Secondary | ICD-10-CM

## 2011-07-25 ENCOUNTER — Telehealth: Payer: Self-pay | Admitting: *Deleted

## 2011-07-26 ENCOUNTER — Encounter: Payer: Self-pay | Admitting: Internal Medicine

## 2011-07-26 ENCOUNTER — Ambulatory Visit (INDEPENDENT_AMBULATORY_CARE_PROVIDER_SITE_OTHER): Payer: Medicare Other | Admitting: Internal Medicine

## 2011-07-26 VITALS — BP 138/70 | HR 76 | Ht 61.0 in

## 2011-07-26 DIAGNOSIS — Z1211 Encounter for screening for malignant neoplasm of colon: Secondary | ICD-10-CM | POA: Diagnosis not present

## 2011-07-26 DIAGNOSIS — R195 Other fecal abnormalities: Secondary | ICD-10-CM

## 2011-07-26 NOTE — Patient Instructions (Addendum)
Options to help the hard stools are a stool softener (docusate), psyllium (Metamucil) or MiraLax. I would try one of the first two. Follow-up with Dr. Leone Payor as needed.  Iva Boop, MD, Clementeen Graham

## 2011-07-26 NOTE — Progress Notes (Signed)
Patient ID: Alice [name], female   DOB: October 17, 1930, 78 y.o.   MRN: [id number]  This very pleasant elderly white woman is here to discuss a possible screening colonoscopy. She was referred to another gastroenterologist who told her she was too old for a screening colonoscopy so she was sent to me. She has hard stools at times, but no change in bowel habits or rectal bleeding. She is currently being treated for breast cancer after surgery and chemotherapy, she is on hormone  Therapy.  She is not inclined to have a colonoscopy. She understands the possibility of having existing colorectal neoplasia and cancer. Admittedly, it is unlikely but it is certainly possible.  We spent 15 minutes or so discussing this.  Medications, allergies, past medical history, past surgical history, family history and social history are reviewed and updated in the EMR.   Assessment and plan:  1 Colorectal cancer screening, average risk it seems. She has declined screening colonoscopy.  2 hard stools, she is to try fiber supplement or stool softener, or perhaps even MiraLax.  CC: Ruthann Cancer, MD

## 2011-07-30 DIAGNOSIS — M79609 Pain in unspecified limb: Secondary | ICD-10-CM | POA: Diagnosis not present

## 2011-07-30 DIAGNOSIS — B351 Tinea unguium: Secondary | ICD-10-CM | POA: Diagnosis not present

## 2011-07-30 DIAGNOSIS — I739 Peripheral vascular disease, unspecified: Secondary | ICD-10-CM | POA: Diagnosis not present

## 2011-07-30 DIAGNOSIS — L608 Other nail disorders: Secondary | ICD-10-CM | POA: Diagnosis not present

## 2011-07-31 ENCOUNTER — Ambulatory Visit (INDEPENDENT_AMBULATORY_CARE_PROVIDER_SITE_OTHER): Payer: Medicare Other | Admitting: Psychiatry

## 2011-07-31 DIAGNOSIS — F0630 Mood disorder due to known physiological condition, unspecified: Secondary | ICD-10-CM | POA: Diagnosis not present

## 2011-08-01 NOTE — Progress Notes (Addendum)
07-31-2011  Patient seen for initial psychological evaluation.  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  She is agreeable to continuing in therapy at this time.  Next appointment is 08-14-2011.

## 2011-08-09 DIAGNOSIS — S61409A Unspecified open wound of unspecified hand, initial encounter: Secondary | ICD-10-CM | POA: Diagnosis not present

## 2011-08-10 ENCOUNTER — Encounter: Payer: Self-pay | Admitting: Physician Assistant

## 2011-08-10 ENCOUNTER — Telehealth: Payer: Self-pay | Admitting: *Deleted

## 2011-08-10 ENCOUNTER — Encounter (HOSPITAL_COMMUNITY): Payer: Self-pay

## 2011-08-10 ENCOUNTER — Other Ambulatory Visit (HOSPITAL_BASED_OUTPATIENT_CLINIC_OR_DEPARTMENT_OTHER): Payer: TRICARE For Life (TFL) | Admitting: Lab

## 2011-08-10 ENCOUNTER — Emergency Department (HOSPITAL_COMMUNITY): Payer: Medicare Other

## 2011-08-10 ENCOUNTER — Ambulatory Visit (HOSPITAL_BASED_OUTPATIENT_CLINIC_OR_DEPARTMENT_OTHER): Payer: Medicare Other | Admitting: Physician Assistant

## 2011-08-10 ENCOUNTER — Observation Stay (HOSPITAL_COMMUNITY)
Admission: EM | Admit: 2011-08-10 | Discharge: 2011-08-11 | Disposition: A | Discharge status: Home / Self Care | Payer: Medicare Other | Attending: Internal Medicine | Admitting: Internal Medicine

## 2011-08-10 VITALS — BP 138/76 | HR 77 | Temp 98.5°F | Ht 61.0 in | Wt 119.9 lb

## 2011-08-10 DIAGNOSIS — L02519 Cutaneous abscess of unspecified hand: Secondary | ICD-10-CM

## 2011-08-10 DIAGNOSIS — C50419 Malignant neoplasm of upper-outer quadrant of unspecified female breast: Secondary | ICD-10-CM

## 2011-08-10 DIAGNOSIS — D72829 Elevated white blood cell count, unspecified: Secondary | ICD-10-CM | POA: Diagnosis present

## 2011-08-10 DIAGNOSIS — E039 Hypothyroidism, unspecified: Secondary | ICD-10-CM | POA: Insufficient documentation

## 2011-08-10 DIAGNOSIS — Z853 Personal history of malignant neoplasm of breast: Secondary | ICD-10-CM | POA: Insufficient documentation

## 2011-08-10 DIAGNOSIS — C50919 Malignant neoplasm of unspecified site of unspecified female breast: Secondary | ICD-10-CM | POA: Diagnosis not present

## 2011-08-10 DIAGNOSIS — S60229A Contusion of unspecified hand, initial encounter: Secondary | ICD-10-CM | POA: Diagnosis not present

## 2011-08-10 DIAGNOSIS — L0390 Cellulitis, unspecified: Secondary | ICD-10-CM | POA: Diagnosis present

## 2011-08-10 DIAGNOSIS — L03119 Cellulitis of unspecified part of limb: Secondary | ICD-10-CM

## 2011-08-10 DIAGNOSIS — M79609 Pain in unspecified limb: Secondary | ICD-10-CM | POA: Diagnosis not present

## 2011-08-10 DIAGNOSIS — S61259A Open bite of unspecified finger without damage to nail, initial encounter: Secondary | ICD-10-CM | POA: Diagnosis present

## 2011-08-10 DIAGNOSIS — IMO0001 Reserved for inherently not codable concepts without codable children: Secondary | ICD-10-CM

## 2011-08-10 DIAGNOSIS — L03019 Cellulitis of unspecified finger: Secondary | ICD-10-CM | POA: Diagnosis not present

## 2011-08-10 DIAGNOSIS — Z9221 Personal history of antineoplastic chemotherapy: Secondary | ICD-10-CM | POA: Diagnosis not present

## 2011-08-10 DIAGNOSIS — E7800 Pure hypercholesterolemia, unspecified: Secondary | ICD-10-CM | POA: Insufficient documentation

## 2011-08-10 DIAGNOSIS — I10 Essential (primary) hypertension: Secondary | ICD-10-CM | POA: Diagnosis not present

## 2011-08-10 DIAGNOSIS — Z170 Estrogen receptor positive status [ER+]: Secondary | ICD-10-CM | POA: Diagnosis not present

## 2011-08-10 DIAGNOSIS — S61459A Open bite of unspecified hand, initial encounter: Secondary | ICD-10-CM

## 2011-08-10 DIAGNOSIS — S61409A Unspecified open wound of unspecified hand, initial encounter: Secondary | ICD-10-CM

## 2011-08-10 DIAGNOSIS — S61209A Unspecified open wound of unspecified finger without damage to nail, initial encounter: Principal | ICD-10-CM | POA: Insufficient documentation

## 2011-08-10 DIAGNOSIS — Z923 Personal history of irradiation: Secondary | ICD-10-CM | POA: Insufficient documentation

## 2011-08-10 DIAGNOSIS — D7289 Other specified disorders of white blood cells: Secondary | ICD-10-CM | POA: Diagnosis not present

## 2011-08-10 DIAGNOSIS — L03114 Cellulitis of left upper limb: Secondary | ICD-10-CM

## 2011-08-10 LAB — DIFFERENTIAL
Basophils Relative: 0 % (ref 0–1)
Eosinophils Absolute: 0.1 10*3/uL (ref 0.0–0.7)
Eosinophils Relative: 1 % (ref 0–5)
Monocytes Absolute: 0.6 10*3/uL (ref 0.1–1.0)
Monocytes Relative: 5 % (ref 3–12)

## 2011-08-10 LAB — CBC WITH DIFFERENTIAL/PLATELET
BASO%: 0.1 % (ref 0.0–2.0)
MCHC: 33.7 g/dL (ref 31.5–36.0)
MONO#: 0.4 10*3/uL (ref 0.1–0.9)
RBC: 4.17 10*6/uL (ref 3.70–5.45)
RDW: 13.8 % (ref 11.2–14.5)
WBC: 9 10*3/uL (ref 3.9–10.3)
lymph#: 1.1 10*3/uL (ref 0.9–3.3)

## 2011-08-10 LAB — BASIC METABOLIC PANEL
BUN: 21 mg/dL (ref 6–23)
Calcium: 9.8 mg/dL (ref 8.4–10.5)
Chloride: 99 mEq/L (ref 96–112)
Creatinine, Ser: 0.67 mg/dL (ref 0.50–1.10)
GFR calc Af Amer: >90 mL/min (ref >90)
GFR calc non Af Amer: 81 mL/min — ABNORMAL LOW (ref >90)

## 2011-08-10 LAB — CBC
HCT: 42.7 % (ref 36.0–46.0)
Hemoglobin: 14.5 g/dL (ref 12.0–15.0)
MCH: 31.6 pg (ref 26.0–34.0)
MCHC: 34 g/dL (ref 30.0–36.0)
MCV: 93 fL (ref 78.0–100.0)
RDW: 13.4 % (ref 11.5–15.5)

## 2011-08-10 LAB — COMPREHENSIVE METABOLIC PANEL
ALT: 14 U/L (ref 0–35)
CO2: 29 mEq/L (ref 19–32)
Calcium: 9.3 mg/dL (ref 8.4–10.5)
Chloride: 104 mEq/L (ref 96–112)
Sodium: 141 mEq/L (ref 135–145)
Total Protein: 5.7 g/dL — ABNORMAL LOW (ref 6.0–8.3)

## 2011-08-10 MED ORDER — ZOLPIDEM TARTRATE 5 MG PO TABS: 5.0000 mg | ORAL_TABLET | Freq: Every evening | ORAL | Status: DC | PRN

## 2011-08-10 MED ORDER — PIPERACILLIN-TAZOBACTAM 3.375 G IVPB
3.3750 g | Freq: Once | INTRAVENOUS | Status: DC
  Filled 2011-08-10: qty 50

## 2011-08-10 MED ORDER — SODIUM CHLORIDE 0.9 % IV SOLN
1.5000 g | Freq: Four times a day (QID) | INTRAVENOUS | Status: DC
  Administered 2011-08-10: 1.5 g via INTRAVENOUS
  Filled 2011-08-10 (×3): qty 1.5

## 2011-08-10 MED ORDER — OMEGA-3-ACID ETHYL ESTERS 1 G PO CAPS
1.0000 g | ORAL_CAPSULE | Freq: Every day | ORAL | Status: DC
  Filled 2011-08-10: qty 1

## 2011-08-10 MED ORDER — HYDROMORPHONE HCL PF 1 MG/ML IJ SOLN: 0.5000 mg | INTRAMUSCULAR | Status: AC | PRN

## 2011-08-10 MED ORDER — TAMOXIFEN CITRATE 10 MG PO TABS
20.0000 mg | ORAL_TABLET | Freq: Every day | ORAL | Status: DC
  Filled 2011-08-10 (×2): qty 2

## 2011-08-10 MED ORDER — DIAZEPAM 2 MG PO TABS: 2.0000 mg | ORAL_TABLET | Freq: Four times a day (QID) | ORAL | Status: DC | PRN

## 2011-08-10 MED ORDER — LORAZEPAM 0.5 MG PO TABS: 0.5000 mg | ORAL_TABLET | Freq: Every day | ORAL | Status: DC | PRN

## 2011-08-10 MED ORDER — SODIUM CHLORIDE 0.9 % IV SOLN
Freq: Once | INTRAVENOUS | Status: AC
  Administered 2011-08-10: 20:00:00 via INTRAVENOUS

## 2011-08-10 MED ORDER — HYDROCODONE-ACETAMINOPHEN 5-325 MG PO TABS
1.0000 | ORAL_TABLET | ORAL | Status: DC | PRN
  Administered 2011-08-10: 2 via ORAL
  Filled 2011-08-10: qty 2

## 2011-08-10 MED ORDER — SODIUM CHLORIDE 0.9 % IV SOLN
3.0000 g | Freq: Once | INTRAVENOUS | Status: AC
  Administered 2011-08-10: 3 g via INTRAVENOUS
  Filled 2011-08-10: qty 3

## 2011-08-10 MED ORDER — LEVOTHYROXINE SODIUM 100 MCG PO TABS
100.0000 ug | ORAL_TABLET | Freq: Every day | ORAL | Status: DC
  Administered 2011-08-11: 100 ug via ORAL
  Filled 2011-08-10: qty 1

## 2011-08-10 MED ORDER — TEMAZEPAM 7.5 MG PO CAPS: 7.5000 mg | ORAL_CAPSULE | Freq: Every evening | ORAL | Status: DC | PRN

## 2011-08-10 MED ORDER — SODIUM CHLORIDE 0.9 % IV SOLN
Freq: Once | INTRAVENOUS | Status: AC
  Administered 2011-08-10: 14:00:00 via INTRAVENOUS

## 2011-08-10 MED ORDER — ONDANSETRON HCL 4 MG/2ML IJ SOLN: 4.0000 mg | Freq: Three times a day (TID) | INTRAMUSCULAR | Status: AC | PRN

## 2011-08-10 MED ORDER — SODIUM CHLORIDE 0.9 % IV SOLN
1.5000 g | Freq: Four times a day (QID) | INTRAVENOUS | Status: DC
  Administered 2011-08-11 (×3): 1.5 g via INTRAVENOUS
  Filled 2011-08-10 (×5): qty 1.5

## 2011-08-10 NOTE — ED Provider Notes (Signed)
History     CSN: [id number]  Arrival date & time 08/10/11  1233   First MD Initiated Contact with Patient 08/10/11 1310      Chief Complaint  Patient presents with  . Animal Bite    (Consider location/radiation/quality/duration/timing/severity/associated sxs/prior treatment) HPI Hx from pt. 78yo R hand dominant F with PMH breast CA, HTN who presents s/p cat bite to L hand. States she was bitten by a cat that she owns about 24hrs ago - she was trying to break up a fight between the cat and a dog. She believes the cat is UTD on vax. She was bitten on the hand around her thumb on both the palmar and dorsal sides of the thumb. She attempted to wash out the wound well with soap and water, but awoke this morning with increased pain, swelling, and redness to the hand; she noted streaking going up her arm. She denies fever.   Past Medical History  Diagnosis Date  . Hypothyroidism   . Breast cancer      Right , chemo, radiation  . Scoliosis     L4 L5  . History of diverticulitis of colon   . Alopecia   . HTN (hypertension)   . Hypercholesteremia   . Seasonal allergies   . DDD (degenerative disc disease)   . SUI (stress urinary incontinence, female)   . Heart palpitations     Past Surgical History  Procedure Date  . Breast lumpectomy 2012    right  . Tonsillectomy 2001    Diverticulitis  . Hernia repair     [facility], Dr. Zachery Dakins  . Port-a-cath removal   . Cataract extraction     left  . Bladder suspension     Family History  Problem Relation Age of Onset  . Heart disease Mother   . Heart disease Father   . Cancer Sister     lymphoma  . Skin cancer Brother     melanoma  . Colon cancer      History  Substance Use Topics  . Smoking status: Former Games developer  . Smokeless tobacco: Never Used  . Alcohol Use: No    OB History    Grav Para Term Preterm Abortions TAB SAB Ect Mult Living                  Review of Systems  Constitutional: Negative for fever, chills,  activity change and appetite change.  Musculoskeletal: Positive for myalgias.  Skin: Positive for color change and wound.  Neurological: Negative for weakness and numbness.    Allergies  Diphenhydramine and Naproxen sodium  Home Medications   Current Outpatient Rx  Name Route Sig Dispense Refill  . DIAZEPAM 2 MG PO TABS Oral Take 2 mg by mouth every 6 (six) hours as needed.    . OMEGA-3 FATTY ACIDS 1000 MG PO CAPS Oral Take 1 g by mouth daily.     Marland Kitchen LEVOTHYROXINE SODIUM 100 MCG PO TABS Oral Take 100 mcg by mouth daily.      Marland Kitchen LORAZEPAM 0.5 MG PO TABS Oral Take 1 tablet (0.5 mg total) by mouth daily as needed for anxiety. 30 tablet 0  . MULTIVITAMIN PO Oral Take by mouth daily.      . ALEVE PO Oral Take by mouth as needed.      Marland Kitchen TAMOXIFEN CITRATE 20 MG PO TABS Oral Take 1 tablet (20 mg total) by mouth daily. 90 tablet 11    BP 166/44  Pulse 63  Temp 97.7 F (36.5 C) (Oral)  Resp 18  SpO2 98%  Physical Exam  Nursing note and vitals reviewed. Constitutional: She appears well-developed and well-nourished. No distress.  HENT:  Head: Normocephalic and atraumatic.  Neck: Normal range of motion.  Cardiovascular: Normal rate, regular rhythm and normal heart sounds.   Pulmonary/Chest: Effort normal and breath sounds normal. She exhibits no tenderness.  Musculoskeletal: Normal range of motion.       Puncture wounds to both the dorsum and palmar aspects of the base of the thumb with edema and erythema to the same. Pt is not keeping thumb in flexion. Mildly tender to palpation to the base of the thumb but not notably tender over the flexor tendon sheath. FROM without notable pain. Erythema with streaking going up volar aspect of forearm to level of elbow. NVI with sensation intact to lt touch; cap refill <2.  Neurological: She is alert.  Skin: Skin is warm and dry. She is not diaphoretic.  Psychiatric: She has a normal mood and affect.    ED Course  Procedures (including critical  care time)   Labs Reviewed  CBC  DIFFERENTIAL  BASIC METABOLIC PANEL   No results found.   No diagnosis found. 1) cat bite 2) cellulitis of L hand   MDM  1:50 PM Pt with edema to base of thumb with overlying cellulitis with streaking up arm to elbow - she does not have tenderness along the flexor sheath, has good ROM in the thumb and is not keeping it in flexion, which lowers concern for flexor tenosynovitis. Concern for spreading infection especially given streaking. Discussed with Dr. Brooke Dare. Page placed to hand to discuss. Unasyn ordered.  2:10 PM Discussed with Dr. Ronie Spies nurse - she will contact him and have him call back.  2:30 PM Discussed with Dr. Mina Marble - he will consult.  3:26 PM Dr. Mina Marble has seen pt. Feels this is more c/w cellulitis at this point. Recommends: - admission to medicine for IV abx - redraw CBC in AM - Unasyn 1.5g q6h - regular diet now, NPO after midnight - he will see in AM  5:11 PM Discussed with hospitalist. Will admit.  Grant Fontana, PA-C 08/10/11 1713

## 2011-08-10 NOTE — Consult Note (Signed)
Reason for Consult:left thumb cat bite Referring Physician: king  KAMREE WIENS is an 78 y.o. female.  HPI: s/p cat bite 24 hours ago with redness and swelling left thumb  Past Medical History  Diagnosis Date  . Hypothyroidism   . Breast cancer      Right , chemo, radiation  . Scoliosis     L4 L5  . History of diverticulitis of colon   . Alopecia   . HTN (hypertension)   . Hypercholesteremia   . Seasonal allergies   . DDD (degenerative disc disease)   . SUI (stress urinary incontinence, female)   . Heart palpitations     Past Surgical History  Procedure Date  . Breast lumpectomy 2012    right  . Tonsillectomy 2001    Diverticulitis  . Hernia repair     RIH, Dr. Zachery Dakins  . Port-a-cath removal   . Cataract extraction     left  . Bladder suspension     Family History  Problem Relation Age of Onset  . Heart disease Mother   . Heart disease Father   . Cancer Sister     lymphoma  . Skin cancer Brother     melanoma  . Colon cancer      Social History:  reports that she has quit smoking. She has never used smokeless tobacco. She reports that she does not drink alcohol or use illicit drugs.  Allergies:  Allergies  Allergen Reactions  . Diphenhydramine Rash  . Naproxen Sodium Rash    Medications: I have reviewed the patient's current medications.  Results for orders placed during the hospital encounter of 08/10/11 (from the past 48 hour(s))  CBC     Status: Abnormal   Collection Time   08/10/11  2:05 PM      Component Value Range Comment   WBC 11.6 (*) 4.0 - 10.5 K/uL    RBC 4.59  3.87 - 5.11 MIL/uL    Hemoglobin 14.5  12.0 - 15.0 g/dL    HCT 04.5  40.9 - 81.1 %    MCV 93.0  78.0 - 100.0 fL    MCH 31.6  26.0 - 34.0 pg    MCHC 34.0  30.0 - 36.0 g/dL    RDW 91.4  78.2 - 95.6 %    Platelets 165  150 - 400 K/uL   DIFFERENTIAL     Status: Abnormal   Collection Time   08/10/11  2:05 PM      Component Value Range Comment   Neutrophils Relative 84 (*) 43 -  77 %    Neutro Abs 9.7 (*) 1.7 - 7.7 K/uL    Lymphocytes Relative 11 (*) 12 - 46 %    Lymphs Abs 1.2  0.7 - 4.0 K/uL    Monocytes Relative 5  3 - 12 %    Monocytes Absolute 0.6  0.1 - 1.0 K/uL    Eosinophils Relative 1  0 - 5 %    Eosinophils Absolute 0.1  0.0 - 0.7 K/uL    Basophils Relative 0  0 - 1 %    Basophils Absolute 0.0  0.0 - 0.1 K/uL   BASIC METABOLIC PANEL     Status: Abnormal   Collection Time   08/10/11  2:05 PM      Component Value Range Comment   Sodium 138  135 - 145 mEq/L    Potassium 3.7  3.5 - 5.1 mEq/L    Chloride 99  96 - 112  mEq/L    CO2 29  19 - 32 mEq/L    Glucose, Bld 88  70 - 99 mg/dL    BUN 21  6 - 23 mg/dL    Creatinine, Ser 0.96  0.50 - 1.10 mg/dL    Calcium 9.8  8.4 - 04.5 mg/dL    GFR calc non Af Amer 81 (*) >90 mL/min    GFR calc Af Amer >90  >90 mL/min     Dg Hand Complete Left  08/10/2011  *RADIOLOGY REPORT*  Clinical Data: Bitten by a cat, left hand pain, swelling, and bruising  LEFT HAND - COMPLETE 3+ VIEW  Comparison: None  Findings: Soft tissue swelling left thumb. Osseous demineralization. Radial subluxation of the first metacarpal at the first [facility] joint. No acute fracture, dislocation, or bone destruction. No radiopaque foreign body or soft tissue gas identified.  IMPRESSION: No acute osseous abnormalities. Degenerative changes with subluxation at first [facility] joint.  Original Report Authenticated By: Lollie Marrow, M.D.    Review of Systems  All other systems reviewed and are negative.   Blood pressure 166/44, pulse 63, temperature 97.7 F (36.5 C), temperature source Oral, resp. rate 18, SpO2 98.00%. Physical Exam  Constitutional: She is oriented to person, place, and time. She appears well-developed and well-nourished.  HENT:  Head: Normocephalic and atraumatic.  Cardiovascular: Normal rate.   Respiratory: Effort normal.  Musculoskeletal:       Left hand: She exhibits swelling.       Hands: Neurological: She is alert and oriented to  person, place, and time.  Skin: Skin is warm.     Psychiatric: She has a normal mood and affect. Her speech is normal and behavior is normal. Thought content normal.    Assessment/Plan: As above   No evidence of joint or flexor sheath involvement at this time. Would continue IV unasyn until WBC normal and then d/c om po augmentin   followup inj my office this Tuesday  Also start TID warm soapy soaks of left hand for 20 minutes  Call if WBC increases or clinical exam worsens  Lita Flynn A 08/10/2011, 3:28 PM

## 2011-08-10 NOTE — Telephone Encounter (Signed)
gave patient 11-12-2011 and 02-12-2012 printed out calendar and gave to the patient

## 2011-08-10 NOTE — ED Notes (Signed)
Patient reports that she was bitten by a cat yesterday at noon. Patient unsure of when or what shots the cat has had. Patient has swelling of left thumb and red streaks up left arm.

## 2011-08-10 NOTE — Progress Notes (Signed)
ID: Alice [name]   DOB: [birth year]  MR#: [id number]  NWG#:[id number]  HISTORY OF PRESENT ILLNESS: [name] noted a mass in her right breast late December of 2011.  She brought this to Dr. Rubye Oaks attention and he set her up for mammography at [facility] in [facility].  This showed a large spiculated mass in the posterior upper outer quadrant of the right breast which was palpable.  By ultrasound it measured 3.6 cm.  It was hypoechoic with internal blood flow.  Biopsy was suggested and performed January 17th, 2012.  The pathology from that biopsy ([id number]) showed an invasive ductal carcinoma, Grade 3, ER 95% and PR 47% positive with an MIB-1 of 50%, no HER2 amplification with a CISH ratio of 1.63.    With this information, the patient was referred to Dr. Donell Beers, and bilateral breast MRIs were obtained March 20, 2010.  This showed in the posterior 3rd of the outer right breast a 3.8 cm mass with a clip artifact and in addition in the axillary tail of the right breast, a 9 mm rounded mildly irregular mass, corresponding to an abnormal appearing lymph node seen at mammography.  There were no other areas suspicious for enhancement.     Her subsequent history is as summarized below  INTERVAL HISTORY: Neomia returns today for followup of her right breast carcinoma. She continues on the tamoxifen.  She continues to participate in training with her new puppy, Aliene Altes, a [place] terrier mix whom she says is a "delightful little dog".  She has started to see Dr. Enzo Bi. She participated in the [place] program, the [name] program and a support group, all of which seems to have been helpful.  Most concerning today is the fact that Alice [name]'s cat bit her last night on the left hand. The hand is very painful today. It is red and swollen, and she has noticed red streaks on her arm. She denies any fevers or chills.  REVIEW OF SYSTEMS: Shawnelle continues to have hot flashes. She's been a  little constipated but treats this diet carefully with prunes. She sometimes has insomnia, especially she takes her tamoxifen in the morning, so she is going to start taking at night. She's had no nausea or vomiting. No cough, shortness of breath, or chest pain. No abnormal headaches. No new pain, although she continues to have some occasional back pain which is chronic and unchanged.  A detailed review of systems is otherwise noncontributory.  PAST MEDICAL HISTORY: Past Medical History  Diagnosis Date  . Hypothyroidism   . Breast cancer      Right , chemo, radiation  . Scoliosis     L4 L5  . History of diverticulitis of colon   . Alopecia   . HTN (hypertension)   . Hypercholesteremia   . Seasonal allergies   . DDD (degenerative disc disease)   . SUI (stress urinary incontinence, female)   . Heart palpitations   1. Greater than 30 pack-year tobacco abuse, the patient quitting in the sixties.  2. History of sling procedure. 3. Seasonal allergies. 4. Status post hernia repair under Dr. Zachery Dakins. 5. Status post small bowel resection for diverticular disease in 2001.  6. Status post left cataract surgery. 7. History of tonsillectomy. 8. History of palpitations.   9. History of degenerative disk disease. 10. Hypothyroidism. 11. Hypercholesterolemia. 12. Hypertension. Stress urinary incontinence  PAST SURGICAL HISTORY: Past Surgical History  Procedure Date  . Breast lumpectomy 2012    right  .  Tonsillectomy 2001    Diverticulitis  . Hernia repair     RIH, Dr. Zachery Dakins  . Port-a-cath removal   . Cataract extraction     left  . Bladder suspension     FAMILY HISTORY The patient's father died from a stroke at the age of 32.  The patient's mother died with congestive heart failure at the age of 82.  The patient has one sister who died with non-Hodgkin's lymphoma at the age of 75.  She has a brother who was a Clinical cytogeneticist and died from unknown causes.    GYNECOLOGIC  HISTORY: She is GX P3, first pregnancy to term at age 24.  She had menarche at age 35.  She went through menopause in her fifties. 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 Son Candie Echevaria is present today. He lives in [place] where he works in Consulting civil engineer.  He is the patient's healthcare power of attorney and may be reached at [phone].  The patient's son Tawanna Cooler also works in IT in the [place] area.  Son L. Dionicio Stall is a Advice worker [employer].  The patient has 5 grandchildren and 2 great-grandchildren. She attends [employer].      ADVANCED DIRECTIVES:  HEALTH MAINTENANCE: History  Substance Use Topics  . Smoking status: Former Games developer  . Smokeless tobacco: Never Used  . Alcohol Use: No     Colonoscopy: never  PAP: 2001  Bone density: within past 5 years  Lipid panel:  Allergies  Allergen Reactions  . Diphenhydramine Rash  . Naproxen Sodium Rash    Current Outpatient Prescriptions  Medication Sig Dispense Refill  . diazepam (VALIUM) 2 MG tablet Take 2 mg by mouth every 6 (six) hours as needed.      . fish oil-omega-3 fatty acids 1000 MG capsule Take 2 g by mouth daily.        Marland Kitchen levothyroxine (SYNTHROID) 100 MCG tablet Take 100 mcg by mouth daily.        Marland Kitchen LORazepam (ATIVAN) 0.5 MG tablet Take 1 tablet (0.5 mg total) by mouth daily as needed for anxiety.  30 tablet  0  . Multiple Vitamin (MULTIVITAMIN PO) Take by mouth daily.        . Naproxen Sodium (ALEVE PO) Take by mouth as needed.        . tamoxifen (NOLVADEX) 20 MG tablet Take 1 tablet (20 mg total) by mouth daily.  90 tablet  11    OBJECTIVE: Middle-aged white woman who appears slightly uncomfortable, but in no acute distress. Filed Vitals:   08/10/11 1124  BP: 138/76  Pulse: 77  Temp: 98.5 F (36.9 C)     Body mass index is 22.65 kg/(m^2).    ECOG FS: 0  Filed  Weights   08/10/11 1124  Weight: 119 lb 14.4 oz (54.386 kg)   Physical Exam: HEENT:  Sclerae anicteric, conjunctivae pink.  Oropharynx clear.     Nodes:  No cervical, supraclavicular, or axillary lymphadenopathy palpated.  Breast Exam:  Right breast is status post lumpectomy with well-healed incision. No suspicious nodularity or skin changes, and no evidence of local recurrence. Left breast is benign no masses, skin changes, or nipple inversion.  Lungs:  Clear to auscultation bilaterally.  No crackles, rhonchi, or wheezes.   Heart:  Regular rate and rhythm.   Abdomen:  Soft, nontender.  Positive bowel sounds.  No organomegaly or masses palpated.   Musculoskeletal:  No focal spinal tenderness to palpation.  Extremities: There is evidence of trauma with puncture wounds on the dorsal surface of the left thumb. The area is extremely red and edematous, warm to touch, and there are two red streaks extending from the thumb to the antecubital fossa. cyanosis.  There is no current drainage or bleeding from the area. No significant swelling other than the edema locally around the thumb itself. No additional peripheral edema. No peripheral cyanosis. Skin:  Benign.   Neuro:  Nonfocal. Alert and oriented x3.  LAB RESULTS: Lab Results  Component Value Date   WBC 9.0 08/10/2011   NEUTROABS 7.4* 08/10/2011   HGB 13.4 08/10/2011   HCT 39.6 08/10/2011   MCV 95.0 08/10/2011   PLT 150 08/10/2011      Chemistry      Component Value Date/Time   NA 142 01/01/2011 1454   K 3.4* 01/01/2011 1454   CL 102 01/01/2011 1454   CO2 30 01/01/2011 1454   BUN 16 01/01/2011 1454   CREATININE 0.68 01/01/2011 1454      Component Value Date/Time   CALCIUM 9.9 01/01/2011 1454   ALKPHOS 51 01/01/2011 1454   AST 24 01/01/2011 1454   ALT 24 01/01/2011 1454   BILITOT 0.2* 01/01/2011 1454       Lab Results  Component Value Date   LABCA2 18 03/22/2010      STUDIES: No new results found. Most recent mammogram 03/29/2011 was  unremarkable  ASSESSMENT: 78 year old [place] woman,   (1)  status post right breast biopsy March 17, 2010 for a grade 3 invasive ductal carcinoma measuring 3.8 cm by MRI with a suspicious right axillary lymph node which was, however, negative by biopsy, the tumor being estrogen and progesterone receptor positive, HER2-negative, with an MIB-1 of 50%.    (2)  Neoadjuvantly, she was treated with dose-dense doxorubicin and cyclophosphamide x4 followed by weekly paclitaxel x 12, completed July 2012 followed by definitive right lumpectomy and sentinel lymph node sampling August 2012 showing a complete pathologic response.   (3)  She completed radiation treatments November 2012 and started tamoxifen at that time  PLAN:  With regards to her breast cancer, Nahjae is doing very well. She will continue on tamoxifen as before, the plan being to continue this for 5 years and then consider 5 more years of tamoxifen versus 5 years of an aromatase inhibitor. She's going to try taking the tamoxifen in the evening to see if this helps with the insomnia. I did refill her lorazepam to take at night if needed.  With regards to the cat bite, however, Dr. Darnelle Catalan has also examined Gerianne and has recommended that she proceed immediately to either a hand surgeon or to the emergency department. We were unable to get her appointment today with the hand surgeon, so we are going to escort her over to the emergency department accordingly for further evaluation.   She will see [place] again in 3 months. She knows to call for any problems that may develop before that   [facility]    08/10/2011

## 2011-08-10 NOTE — H&P (Signed)
Patient's PCP: Jackie Plum, MD  Chief Complaint: hand pain  History of Present Illness: Alice [name] is a 78 y.o. white female who came to the ER after suffering a cat bite.  Happened yesterday and overnight developed redness and swelling.  Came to the ER was seen by ortho who recommended admission and abx.  WBCs were elevated.  Patient insistent on going home tomm. +chills No fever No cp No SOB  Meds: Scheduled Meds:    . sodium chloride   Intravenous Once  . sodium chloride   Intravenous Once  . ampicillin-sulbactam (UNASYN) IV  1.5 g Intravenous Q6H  . ampicillin-sulbactam (UNASYN) IV  3 g Intravenous Once  . DISCONTD: piperacillin-tazobactam (ZOSYN)  IV  3.375 g Intravenous Once   Continuous Infusions:  PRN Meds:. Allergies: Diphenhydramine and Naproxen sodium Past Medical History  Diagnosis Date  . Hypothyroidism   . Breast cancer      Right , chemo, radiation  . Scoliosis     L4 L5  . History of diverticulitis of colon   . Alopecia   . HTN (hypertension)   . Hypercholesteremia   . Seasonal allergies   . DDD (degenerative disc disease)   . SUI (stress urinary incontinence, female)   . Heart palpitations    Past Surgical History  Procedure Date  . Breast lumpectomy 2012    right  . Tonsillectomy 2001    Diverticulitis  . Hernia repair     RIH, Dr. Zachery Dakins  . Port-a-cath removal   . Cataract extraction     left  . Bladder suspension    Family History  Problem Relation Age of Onset  . Heart disease Mother   . Heart disease Father   . Cancer Sister     lymphoma  . Skin cancer Brother     melanoma  . Colon cancer     History   Social History  . Marital Status: Divorced    Spouse Name: N/A    Number of Children: 3  . Years of Education: N/A   Occupational History  . Retired    Social History Main Topics  . Smoking status: Former Smoker    Quit date: 08/09/1964  . Smokeless tobacco: Never Used  . Alcohol Use: Yes     occasional    . Drug Use: No  . Sexually Active: Not on file   Other Topics Concern  . Not on file   Social History Narrative  . No narrative on file   Review of Systems: All systems reviewed with the patient and positive as per history of present illness, otherwise all other systems are negative.   Physical Exam: Blood pressure 166/44, pulse 75, temperature 97.8 F (36.6 C), temperature source Oral, resp. rate 18, SpO2 96.00%. General: Awake, Oriented x3, No acute distress. HEENT: EOMI, Moist mucous membranes Neck: Supple CV: S1 and S2, rrr Lungs: Clear to ascultation bilaterally, no wheezing Abdomen: Soft, Nontender, Nondistended, +bowel sounds. Neuro: Cranial Nerves II-XII grossly intact. Has 5/5 motor strength in upper and lower extremities. Left hand swelling and redness extending up past elbow   Lab results:  Basename 08/10/11 1405 08/10/11 1054  NA 138 141  K 3.7 3.9  CL 99 104  CO2 29 29  GLUCOSE 88 84  BUN 21 22  CREATININE 0.67 0.78  CALCIUM 9.8 9.3  MG -- --  PHOS -- --    Basename 08/10/11 1054  AST 17  ALT 14  ALKPHOS 34*  BILITOT 0.6  PROT 5.7*  ALBUMIN 3.9   No results found for this basename: LIPASE:2,AMYLASE:2 in the last 72 hours  Basename 08/10/11 1405 08/10/11 1054  WBC 11.6* 9.0  NEUTROABS 9.7* 7.4*  HGB 14.5 13.4  HCT 42.7 39.6  MCV 93.0 95.0  PLT 165 150   No results found for this basename: CKTOTAL:3,CKMB:3,CKMBINDEX:3,TROPONINI:3 in the last 72 hours No components found with this basename: POCBNP:3 No results found for this basename: DDIMER in the last 72 hours No results found for this basename: HGBA1C:2 in the last 72 hours No results found for this basename: CHOL:2,HDL:2,LDLCALC:2,TRIG:2,CHOLHDL:2,LDLDIRECT:2 in the last 72 hours No results found for this basename: TSH,T4TOTAL,FREET3,T3FREE,THYROIDAB in the last 72 hours No results found for this basename: VITAMINB12:2,FOLATE:2,FERRITIN:2,TIBC:2,IRON:2,RETICCTPCT:2 in the last 72  hours Imaging results:  Dg Hand Complete Left  08/10/2011  *RADIOLOGY REPORT*  Clinical Data: Bitten by a cat, left hand pain, swelling, and bruising  LEFT HAND - COMPLETE 3+ VIEW  Comparison: None  Findings: Soft tissue swelling left thumb. Osseous demineralization. Radial subluxation of the first metacarpal at the first [facility] joint. No acute fracture, dislocation, or bone destruction. No radiopaque foreign body or soft tissue gas identified.  IMPRESSION: No acute osseous abnormalities. Degenerative changes with subluxation at first [facility] joint.  Original Report Authenticated By: Lollie Marrow, M.D.    Assessment & Plan by Problem:   *Cat bite of finger-seen by ortho who states: Would continue IV unasyn until WBC normal and then d/c on po augmentin followup in (his) office this Tuesday.   Also start TID warm soapy soaks of left hand for 20 minutes Call if WBC increases or clinical exam worsens    Cellulitis   Leukocytosis   Time spent on admission, talking to the patient, and coordinating care was: 35 mins.  Kimara Bencomo, DO 08/10/2011, 5:41 PM

## 2011-08-11 DIAGNOSIS — L02519 Cutaneous abscess of unspecified hand: Secondary | ICD-10-CM | POA: Diagnosis not present

## 2011-08-11 DIAGNOSIS — IMO0001 Reserved for inherently not codable concepts without codable children: Secondary | ICD-10-CM

## 2011-08-11 DIAGNOSIS — D7289 Other specified disorders of white blood cells: Secondary | ICD-10-CM

## 2011-08-11 DIAGNOSIS — L03119 Cellulitis of unspecified part of limb: Secondary | ICD-10-CM

## 2011-08-11 LAB — CBC
HCT: 34.6 % — ABNORMAL LOW (ref 36.0–46.0)
MCHC: 33.8 g/dL (ref 30.0–36.0)
Platelets: 127 10*3/uL — ABNORMAL LOW (ref 150–400)
RDW: 13.7 % (ref 11.5–15.5)

## 2011-08-11 LAB — DIFFERENTIAL
Basophils Absolute: 0 10*3/uL (ref 0.0–0.1)
Basophils Relative: 0 % (ref 0–1)
Monocytes Absolute: 0.6 10*3/uL (ref 0.1–1.0)
Neutro Abs: 6.8 10*3/uL (ref 1.7–7.7)

## 2011-08-11 MED ORDER — AMOXICILLIN-POT CLAVULANATE 875-125 MG PO TABS: 1.0000 | ORAL_TABLET | Freq: Two times a day (BID) | ORAL | Status: AC

## 2011-08-11 MED ORDER — AMOXICILLIN-POT CLAVULANATE 875-125 MG PO TABS: 1.0000 | ORAL_TABLET | Freq: Two times a day (BID) | ORAL | Status: DC

## 2011-08-11 MED ORDER — HYDROCODONE-ACETAMINOPHEN 5-325 MG PO TABS: 1.0000 | ORAL_TABLET | ORAL | Status: AC | PRN

## 2011-08-11 NOTE — Progress Notes (Signed)
Patient ID: Alice [name], female   DOB: 1931/02/15, 78 y.o.   MRN: [id number] Doing much better this morning  Would d/c after next dose of unasyn and send out on augmentin 875 BID  followup in my office on Tuesday  She is to call [phone] if condition worsens

## 2011-08-11 NOTE — Discharge Summary (Signed)
Discharge Summary  EMRYN FLANERY MR#: [id number]  DOB:12/06/1930  Date of Admission: 08/10/2011 Date of Discharge: 08/11/2011  Patient's PCP: Jackie Plum, MD  Attending Physician:Coby Shrewsberry  Consults:   orthopedics  Discharge Diagnoses: Principal Problem:  *Cat bite of finger Active Problems:  Cellulitis  Leukocytosis   Brief Admitting History and Physical Alice [name] is a 78 y.o. white female who came to the ER after suffering a cat bite. Happened yesterday and overnight developed redness and swelling. Came to the ER was seen by ortho who recommended admission and abx. WBCs were elevated. Patient insistent on going home tomm.  +chills  No fever  No cp  No SOB   Discharge Medications Medication List  As of 08/11/2011 12:53 PM   TAKE these medications         ALEVE PO   Take by mouth as needed.      amoxicillin-clavulanate 875-125 MG per tablet   Commonly known as: AUGMENTIN   Take 1 tablet by mouth every 12 (twelve) hours.      diazepam 2 MG tablet   Commonly known as: VALIUM   Take 2 mg by mouth every 6 (six) hours as needed.      fish oil-omega-3 fatty acids 1000 MG capsule   Take 1 g by mouth daily.      HYDROcodone-acetaminophen 5-325 MG per tablet   Commonly known as: NORCO   Take 1 tablet by mouth every 4 (four) hours as needed.      LORazepam 0.5 MG tablet   Commonly known as: ATIVAN   Take 1 tablet (0.5 mg total) by mouth daily as needed for anxiety.      MULTIVITAMIN PO   Take by mouth daily.      SYNTHROID 100 MCG tablet   Generic drug: levothyroxine   Take 100 mcg by mouth daily.      tamoxifen 20 MG tablet   Commonly known as: NOLVADEX   Take 1 tablet (20 mg total) by mouth daily.            Hospital Course: Cat bite of finger- give IV ab (unasyn) then D/C'd on Po Augmentin per ortho recs- area of swelling and redness has improved   Cellulitis- decreased  .Leukocytosis - resolved   Day of Discharge BP 108/52   Pulse 76  Temp 98 F (36.7 C) (Oral)  Resp 18  Ht 5\' 1"  (1.549 m)  Wt 53.978 kg (119 lb)  BMI 22.48 kg/m2  SpO2 99% A+Ox3 NAD +BS soft NT Clear ant b/L  Results for orders placed during the hospital encounter of 08/10/11 (from the past 48 hour(s))  CBC     Status: Abnormal   Collection Time   08/10/11  2:05 PM      Component Value Range Comment   WBC 11.6 (*) 4.0 - 10.5 K/uL    RBC 4.59  3.87 - 5.11 MIL/uL    Hemoglobin 14.5  12.0 - 15.0 g/dL    HCT 82.9  56.2 - 13.0 %    MCV 93.0  78.0 - 100.0 fL    MCH 31.6  26.0 - 34.0 pg    MCHC 34.0  30.0 - 36.0 g/dL    RDW 86.5  78.4 - 69.6 %    Platelets 165  150 - 400 K/uL   DIFFERENTIAL     Status: Abnormal   Collection Time   08/10/11  2:05 PM      Component Value Range Comment   Neutrophils  Relative 84 (*) 43 - 77 %    Neutro Abs 9.7 (*) 1.7 - 7.7 K/uL    Lymphocytes Relative 11 (*) 12 - 46 %    Lymphs Abs 1.2  0.7 - 4.0 K/uL    Monocytes Relative 5  3 - 12 %    Monocytes Absolute 0.6  0.1 - 1.0 K/uL    Eosinophils Relative 1  0 - 5 %    Eosinophils Absolute 0.1  0.0 - 0.7 K/uL    Basophils Relative 0  0 - 1 %    Basophils Absolute 0.0  0.0 - 0.1 K/uL   BASIC METABOLIC PANEL     Status: Abnormal   Collection Time   08/10/11  2:05 PM      Component Value Range Comment   Sodium 138  135 - 145 mEq/L    Potassium 3.7  3.5 - 5.1 mEq/L    Chloride 99  96 - 112 mEq/L    CO2 29  19 - 32 mEq/L    Glucose, Bld 88  70 - 99 mg/dL    BUN 21  6 - 23 mg/dL    Creatinine, Ser 1.61  0.50 - 1.10 mg/dL    Calcium 9.8  8.4 - 09.6 mg/dL    GFR calc non Af Amer 81 (*) >90 mL/min    GFR calc Af Amer >90  >90 mL/min   CBC     Status: Abnormal   Collection Time   08/11/11  4:12 AM      Component Value Range Comment   WBC 9.4  4.0 - 10.5 K/uL    RBC 3.71 (*) 3.87 - 5.11 MIL/uL    Hemoglobin 11.7 (*) 12.0 - 15.0 g/dL    HCT 04.5 (*) 40.9 - 46.0 %    MCV 93.3  78.0 - 100.0 fL    MCH 31.5  26.0 - 34.0 pg    MCHC 33.8  30.0 - 36.0 g/dL     RDW 81.1  91.4 - 78.2 %    Platelets 127 (*) 150 - 400 K/uL   DIFFERENTIAL     Status: Normal   Collection Time   08/11/11  4:12 AM      Component Value Range Comment   Neutrophils Relative 73  43 - 77 %    Neutro Abs 6.8  1.7 - 7.7 K/uL    Lymphocytes Relative 19  12 - 46 %    Lymphs Abs 1.8  0.7 - 4.0 K/uL    Monocytes Relative 6  3 - 12 %    Monocytes Absolute 0.6  0.1 - 1.0 K/uL    Eosinophils Relative 1  0 - 5 %    Eosinophils Absolute 0.1  0.0 - 0.7 K/uL    Basophils Relative 0  0 - 1 %    Basophils Absolute 0.0  0.0 - 0.1 K/uL     Dg Hand Complete Left  08/10/2011  *RADIOLOGY REPORT*  Clinical Data: Bitten by a cat, left hand pain, swelling, and bruising  LEFT HAND - COMPLETE 3+ VIEW  Comparison: None  Findings: Soft tissue swelling left thumb. Osseous demineralization. Radial subluxation of the first metacarpal at the first [facility] joint. No acute fracture, dislocation, or bone destruction. No radiopaque foreign body or soft tissue gas identified.  IMPRESSION: No acute osseous abnormalities. Degenerative changes with subluxation at first [facility] joint.  Original Report Authenticated By: Lollie Marrow, M.D.     Disposition: home  Diet: regular  Activity: as tolerated   Follow-up Appts: Orthopedics on Tuesday   Discharge Orders    Future Appointments: Provider: Department: Dept Phone: Center:   11/12/2011 1:30 PM Radene Gunning [facility] [phone] None   11/12/2011 2:00 PM [name], MD [facility] [phone] None   02/12/2012 1:15 PM Radene Gunning [facility] [phone] None   02/12/2012 1:45 PM Amy Allegra Grana, PA [facility] [phone] None     Future Orders Please Complete By Expires   Diet general      Increase activity slowly      Discharge instructions      Comments:   3x/day warm soaks   Driving Restrictions      Comments:   While taking narcotics       Time spent on discharge, talking to the patient, and coordinating care: 45  mins.   SignedMarlin Canary, DO 08/11/2011, 12:53 PM

## 2011-08-11 NOTE — Progress Notes (Signed)
Discharge teaching completed. Prescriptions given for amoxicillin, norco. Handouts given and reviewed on Amoxicillin, norco, cellulitis. Answered questions. Pt. Right AC IV infiltrated. Mod. Amount of swelling noted. Dr. Benjamine Mola and pharmacy notified. Pt. Instructed to elevate extremity and apply heat packs. Also to notify MD if swelling worsens. Pt. Understands.Pt. Left via ambulation with friend. No resp. Distress noted.

## 2011-08-11 NOTE — Progress Notes (Signed)
Pt. To floor from ER per cart, transferred to bed.  Pt. Alert, appropriate.  No order for continuously infusing IV, IV changed to NSL.  Pt. Oriented to unit, room, watched safty video.  Lester Kinsman Lonia Mad, RN

## 2011-08-11 NOTE — ED Provider Notes (Signed)
Medical screening examination/treatment/procedure(s) were conducted as a shared visit with non-physician practitioner(s) and myself.  I personally evaluated the patient during the encounter  Bit by cat on dominant thumb.  Has cellulitis with strekaing up arm consistent with lymphangitic spread.  ?abscess.  Will provide abx and admit. Hand consult  Dayton Bailiff, MD 08/11/11 1623

## 2011-08-14 ENCOUNTER — Ambulatory Visit (INDEPENDENT_AMBULATORY_CARE_PROVIDER_SITE_OTHER): Payer: Medicare Other | Admitting: Psychiatry

## 2011-08-14 DIAGNOSIS — F0630 Mood disorder due to known physiological condition, unspecified: Secondary | ICD-10-CM | POA: Diagnosis not present

## 2011-08-14 DIAGNOSIS — S61409A Unspecified open wound of unspecified hand, initial encounter: Secondary | ICD-10-CM | POA: Diagnosis not present

## 2011-08-14 NOTE — Progress Notes (Signed)
08-14-2011  Patient seen for individual psychotherapy.  Session focused on helping patient explore her post-treatment options to renew her life. Next appointment 09-11-2011.

## 2011-08-15 DIAGNOSIS — M25559 Pain in unspecified hip: Secondary | ICD-10-CM | POA: Diagnosis not present

## 2011-08-15 DIAGNOSIS — M76899 Other specified enthesopathies of unspecified lower limb, excluding foot: Secondary | ICD-10-CM | POA: Diagnosis not present

## 2011-08-20 ENCOUNTER — Encounter: Payer: Self-pay | Admitting: *Deleted

## 2011-08-20 NOTE — Progress Notes (Unsigned)
Pt called to state onset of rash with recent use of amoxicillin.  Alice [name] states with 2nd to last dose of medication she noticed a rash on limbs and trunk. No noted breathing or itching occurred.  ABX was started for a cat bite on thumb per ER

## 2011-09-10 DIAGNOSIS — H2510 Age-related nuclear cataract, unspecified eye: Secondary | ICD-10-CM | POA: Diagnosis not present

## 2011-09-10 DIAGNOSIS — D3130 Benign neoplasm of unspecified choroid: Secondary | ICD-10-CM | POA: Diagnosis not present

## 2011-09-10 DIAGNOSIS — H04229 Epiphora due to insufficient drainage, unspecified lacrimal gland: Secondary | ICD-10-CM | POA: Diagnosis not present

## 2011-09-11 ENCOUNTER — Ambulatory Visit (INDEPENDENT_AMBULATORY_CARE_PROVIDER_SITE_OTHER): Payer: Medicare Other | Admitting: Psychiatry

## 2011-09-11 DIAGNOSIS — F0630 Mood disorder due to known physiological condition, unspecified: Secondary | ICD-10-CM | POA: Diagnosis not present

## 2011-09-11 NOTE — Progress Notes (Signed)
09-12-2011  Patient seen for individual psychotherapy.  Patient WC for next appointment.

## 2011-10-08 DIAGNOSIS — E785 Hyperlipidemia, unspecified: Secondary | ICD-10-CM | POA: Diagnosis not present

## 2011-10-08 DIAGNOSIS — I2589 Other forms of chronic ischemic heart disease: Secondary | ICD-10-CM | POA: Diagnosis not present

## 2011-10-08 DIAGNOSIS — F411 Generalized anxiety disorder: Secondary | ICD-10-CM | POA: Diagnosis not present

## 2011-10-08 DIAGNOSIS — G609 Hereditary and idiopathic neuropathy, unspecified: Secondary | ICD-10-CM | POA: Diagnosis not present

## 2011-10-08 DIAGNOSIS — I10 Essential (primary) hypertension: Secondary | ICD-10-CM | POA: Diagnosis not present

## 2011-10-08 DIAGNOSIS — E039 Hypothyroidism, unspecified: Secondary | ICD-10-CM | POA: Diagnosis not present

## 2011-10-08 DIAGNOSIS — IMO0002 Reserved for concepts with insufficient information to code with codable children: Secondary | ICD-10-CM | POA: Diagnosis not present

## 2011-10-08 DIAGNOSIS — R21 Rash and other nonspecific skin eruption: Secondary | ICD-10-CM | POA: Diagnosis not present

## 2011-10-08 DIAGNOSIS — E559 Vitamin D deficiency, unspecified: Secondary | ICD-10-CM | POA: Diagnosis not present

## 2011-10-18 DIAGNOSIS — I739 Peripheral vascular disease, unspecified: Secondary | ICD-10-CM | POA: Diagnosis not present

## 2011-10-18 DIAGNOSIS — M79609 Pain in unspecified limb: Secondary | ICD-10-CM | POA: Diagnosis not present

## 2011-10-18 DIAGNOSIS — B351 Tinea unguium: Secondary | ICD-10-CM | POA: Diagnosis not present

## 2011-10-18 DIAGNOSIS — L600 Ingrowing nail: Secondary | ICD-10-CM | POA: Diagnosis not present

## 2011-10-31 ENCOUNTER — Ambulatory Visit (HOSPITAL_BASED_OUTPATIENT_CLINIC_OR_DEPARTMENT_OTHER): Payer: Medicare Other | Admitting: Oncology

## 2011-10-31 ENCOUNTER — Other Ambulatory Visit: Payer: Self-pay | Admitting: *Deleted

## 2011-10-31 ENCOUNTER — Other Ambulatory Visit (HOSPITAL_BASED_OUTPATIENT_CLINIC_OR_DEPARTMENT_OTHER): Payer: Medicare Other

## 2011-10-31 VITALS — BP 151/80 | HR 61 | Temp 97.9°F | Resp 20 | Ht 61.0 in | Wt 122.3 lb

## 2011-10-31 DIAGNOSIS — Z170 Estrogen receptor positive status [ER+]: Secondary | ICD-10-CM | POA: Diagnosis not present

## 2011-10-31 DIAGNOSIS — G4700 Insomnia, unspecified: Secondary | ICD-10-CM

## 2011-10-31 DIAGNOSIS — C50419 Malignant neoplasm of upper-outer quadrant of unspecified female breast: Secondary | ICD-10-CM | POA: Diagnosis not present

## 2011-10-31 DIAGNOSIS — F411 Generalized anxiety disorder: Secondary | ICD-10-CM

## 2011-10-31 LAB — CBC WITH DIFFERENTIAL/PLATELET
BASO%: 0.6 % (ref 0.0–2.0)
Eosinophils Absolute: 0.2 10*3/uL (ref 0.0–0.5)
LYMPH%: 39.7 % (ref 14.0–49.7)
MCHC: 33.8 g/dL (ref 31.5–36.0)
MCV: 92.9 fL (ref 79.5–101.0)
MONO#: 0.4 10*3/uL (ref 0.1–0.9)
MONO%: 8 % (ref 0.0–14.0)
NEUT#: 2.4 10*3/uL (ref 1.5–6.5)
RBC: 4.11 10*6/uL (ref 3.70–5.45)
RDW: 13.7 % (ref 11.2–14.5)
WBC: 5.1 10*3/uL (ref 3.9–10.3)

## 2011-10-31 LAB — COMPREHENSIVE METABOLIC PANEL (CC13)
ALT: 16 U/L (ref 0–55)
AST: 18 U/L (ref 5–34)
Albumin: 3.5 g/dL (ref 3.5–5.0)
Alkaline Phosphatase: 34 U/L — ABNORMAL LOW (ref 40–150)
Glucose: 77 mg/dl (ref 70–99)
Potassium: 4 mEq/L (ref 3.5–5.1)
Sodium: 141 mEq/L (ref 136–145)
Total Bilirubin: 0.4 mg/dL (ref 0.20–1.20)
Total Protein: 6.1 g/dL — ABNORMAL LOW (ref 6.4–8.3)

## 2011-10-31 MED ORDER — VENLAFAXINE HCL 37.5 MG PO TABS: 37.5000 mg | ORAL_TABLET | Freq: Two times a day (BID) | ORAL | Status: DC

## 2011-10-31 MED ORDER — GABAPENTIN 300 MG PO CAPS: 300.0000 mg | ORAL_CAPSULE | Freq: Every day | ORAL | Status: DC

## 2011-10-31 NOTE — Progress Notes (Signed)
ID: Alice [name]   DOB: January 02, 1931  MR#: [id number]  WUJ#:[id number]  HISTORY OF PRESENT ILLNESS: Alice [name] noted a mass in her right breast late December of 2011.  She brought this to Dr. Rubye Oaks attention and he set her up for mammography at [facility] in [facility].  This showed a large spiculated mass in the posterior upper outer quadrant of the right breast which was palpable.  By ultrasound it measured 3.6 cm.  It was hypoechoic with internal blood flow.  Biopsy was suggested and performed January 17th, 2012.  The pathology from that biopsy ([id number]) showed an invasive ductal carcinoma, Grade 3, ER 95% and PR 47% positive with an MIB-1 of 50%, no HER2 amplification with a CISH ratio of 1.63.    With this information, the patient was referred to Dr. Donell Beers, and bilateral breast MRIs were obtained March 20, 2010.  This showed in the posterior 3rd of the outer right breast a 3.8 cm mass with a clip artifact and in addition in the axillary tail of the right breast, a 9 mm rounded mildly irregular mass, corresponding to an abnormal appearing lymph node seen at mammography.  There were no other areas suspicious for enhancement.     Her subsequent history is as summarized below  INTERVAL HISTORY: Alice [name] returns today for followup of her right breast carcinoma. The interval history is unremarkable. She takes her dog for a walk at least twice daily. She is trying to of tame her cat, a feral animal that keeps bringing dead birds into the house  REVIEW OF SYSTEMS: Lorrena is tolerating the tamoxifen well. She does have some hot flashes which are not a major concern to her. She is not having vaginal wetness problems. She is having more trouble sleeping. She went and had her eyes examined and some maculae were found on the retina -- for a time it was thought these might be metastases, but they turned out to be "freckles". They are being watched.  However this triggered  a significant amount of  anxiety, and she feels that really is what's behind her insomnia. She has some aches and pains here and there which are not more frequent or intense than prior; otherwise a detailed review of systems today was noncontributory.  PAST MEDICAL HISTORY: Past Medical History  Diagnosis Date  . Hypothyroidism   . Breast cancer      Right , chemo, radiation  . Scoliosis     L4 L5  . History of diverticulitis of colon   . Alopecia   . HTN (hypertension)   . Hypercholesteremia   . Seasonal allergies   . DDD (degenerative disc disease)   . SUI (stress urinary incontinence, female)   . Heart palpitations   1. Greater than 30 pack-year tobacco abuse, the patient quitting in the sixties.  2. History of sling procedure. 3. Seasonal allergies. 4. Status post hernia repair under Dr. Zachery Dakins. 5. Status post small bowel resection for diverticular disease in 2001.  6. Status post left cataract surgery. 7. History of tonsillectomy. 8. History of palpitations.   9. History of degenerative disk disease. 10. Hypothyroidism. 11. Hypercholesterolemia. 12. Hypertension. Stress urinary incontinence  PAST SURGICAL HISTORY: Past Surgical History  Procedure Date  . Breast lumpectomy 2012    right  . Tonsillectomy 2001    Diverticulitis  . Hernia repair     RIH, Dr. Zachery Dakins  . Port-a-cath removal   . Cataract extraction     left  .  Bladder suspension     FAMILY HISTORY The patient's father died from a stroke at the age of 29.  The patient's mother died with congestive heart failure at the age of 105.  The patient has one sister who died with non-Hodgkin's lymphoma at the age of 42.  She has a brother who was a Clinical cytogeneticist and died from unknown causes.    GYNECOLOGIC HISTORY: She is GX P3, first pregnancy to term at age 37.  She had menarche at age 66.  She went through menopause in her fifties. She took hormone replacement until the time of her breast cancer diagnosis  SOCIAL  HISTORY: She worked as a Emergency planning/management officer. She is divorced and lives by herself. Son Candie Echevaria lives in [place] where he works in Consulting civil engineer.  He is the patient's healthcare power of attorney and may be reached at [phone].  The patient's son Tawanna Cooler also works in IT in the [place] area.  Son L. Dionicio Stall is a Advice worker [employer].  The patient has 5 grandchildren and 2 great-grandchildren. She attends [employer].      ADVANCED DIRECTIVES:  HEALTH MAINTENANCE: History  Substance Use Topics  . Smoking status: Former Smoker    Quit date: 08/09/1964  . Smokeless tobacco: Never Used  . Alcohol Use: Yes     occasional     Colonoscopy: never  PAP: 2001  Bone density: within past 5 years  Lipid panel:  Allergies  Allergen Reactions  . Amoxicillin Rash  . Diphenhydramine Rash  . Naproxen Sodium Rash    Current Outpatient Prescriptions  Medication Sig Dispense Refill  . fish oil-omega-3 fatty acids 1000 MG capsule Take 1 g by mouth daily.       Marland Kitchen levothyroxine (SYNTHROID) 100 MCG tablet Take 100 mcg by mouth daily.        Marland Kitchen LORazepam (ATIVAN) 0.5 MG tablet Take 1 tablet (0.5 mg total) by mouth daily as needed for anxiety.  30 tablet  0  . Multiple Vitamin (MULTIVITAMIN PO) Take by mouth daily.        . Naproxen Sodium (ALEVE PO) Take by mouth as needed.        . tamoxifen (NOLVADEX) 20 MG tablet Take 1 tablet (20 mg total) by mouth daily.  90 tablet  11  . diazepam (VALIUM) 2 MG tablet Take 2 mg by mouth every 6 (six) hours as needed.        OBJECTIVE: Middle-aged white woman in no acute distress. Filed Vitals:   10/31/11 1119  BP: 151/80  Pulse: 61  Temp: 97.9 F (36.6 C)  Resp: 20     Body mass index is 23.11 kg/(m^2).    ECOG FS: 0  Filed Weights   10/31/11 1119  Weight: 122 lb 4.8 oz (55.475 kg)   Physical Exam: HEENT:  Sclerae anicteric.  Oropharynx clear.     Nodes:  No cervical, supraclavicular, or axillary lymphadenopathy  palpated.  Breast Exam:  Right breast is status post lumpectomy with no evidence of local recurrence. Left breast is benign Lungs:  Clear to auscultation bilaterally.   Heart:  Regular rate and rhythm.   Abdomen:  Soft, nontender.  Positive bowel sounds.  No organomegaly or masses palpated.   Musculoskeletal:  No focal spinal tenderness to palpation.  Extremities: No peripheral edema. Skin:  Benign.   Neuro:  Nonfocal. Alert and oriented x3.  LAB RESULTS: Lab Results  Component Value Date   WBC 5.1 10/31/2011  NEUTROABS 2.4 10/31/2011   HGB 12.9 10/31/2011   HCT 38.2 10/31/2011   MCV 92.9 10/31/2011   PLT 151 10/31/2011      Chemistry      Component Value Date/Time   NA 141 10/31/2011 1036   NA 138 08/10/2011 1405   K 4.0 10/31/2011 1036   K 3.7 08/10/2011 1405   CL 105 10/31/2011 1036   CL 99 08/10/2011 1405   CO2 27 10/31/2011 1036   CO2 29 08/10/2011 1405   BUN 21.0 10/31/2011 1036   BUN 21 08/10/2011 1405   CREATININE 0.8 10/31/2011 1036   CREATININE 0.67 08/10/2011 1405      Component Value Date/Time   CALCIUM 9.1 10/31/2011 1036   CALCIUM 9.8 08/10/2011 1405   ALKPHOS 34* 10/31/2011 1036   ALKPHOS 34* 08/10/2011 1054   AST 18 10/31/2011 1036   AST 17 08/10/2011 1054   ALT 16 10/31/2011 1036   ALT 14 08/10/2011 1054   BILITOT 0.40 10/31/2011 1036   BILITOT 0.6 08/10/2011 1054       Lab Results  Component Value Date   LABCA2 18 03/22/2010   STUDIES: No new results found. Most recent mammogram 03/29/2011 was unremarkable  ASSESSMENT: 78 year old [place] woman,   (1)  status post right breast biopsy March 17, 2010 for a grade 3 invasive ductal carcinoma measuring 3.8 cm by MRI with a suspicious right axillary lymph node which was, however, negative by biopsy, the tumor being estrogen and progesterone receptor positive, HER2-negative, with an MIB-1 of 50%.    (2)  Neoadjuvantly, she was treated with dose-dense doxorubicin and cyclophosphamide x4 followed by weekly paclitaxel x 12, completed  July 2012 followed by definitive right lumpectomy and sentinel lymph node sampling August 2012 showing a complete pathologic response.   (3)  She completed radiation treatments November 2012 and started tamoxifen at that time  PLAN:  Diann is doing very well from a breast cancer point of view. She is exhibiting some symptoms consistent with mild posttraumatic stress, and I think she would benefit from venlafaxine. I wrote her a prescription for this 37.5 mg dose to be taken twice daily. If she still having insomnia problems after 2 or 3 weeks on venlafaxine she will add gabapentin 300 mg at bedtime. I am hopeful of with these interventions plus the fact that she participates in our support groups here as she will be able to put away her anxiety and in 265 and concentrate on living. She knows to call for any problems that may develop before the next visit, which will be in 3 months.   MAGRINAT,GUSTAV C    10/31/2011

## 2011-11-01 ENCOUNTER — Telehealth: Payer: Self-pay | Admitting: *Deleted

## 2011-11-01 NOTE — Telephone Encounter (Signed)
Selene called to state she took " the little orange pill " ( md prescribed effexor ) prescribed per visit yesturday before bed- " and I have felt sicker than a dog"  Reika states she has had hot and cold flashes with nausea.  She feels above is related to medication and is resting today.  Sayward declines to take further medication.  She will call this RN tomorrow with update.

## 2011-11-12 ENCOUNTER — Ambulatory Visit: Payer: Medicare Other | Admitting: Oncology

## 2011-11-12 ENCOUNTER — Other Ambulatory Visit: Payer: Medicare Other | Admitting: Lab

## 2011-11-13 DIAGNOSIS — Z23 Encounter for immunization: Secondary | ICD-10-CM | POA: Diagnosis not present

## 2011-11-29 ENCOUNTER — Encounter (HOSPITAL_COMMUNITY): Payer: Self-pay

## 2011-11-29 ENCOUNTER — Emergency Department (INDEPENDENT_AMBULATORY_CARE_PROVIDER_SITE_OTHER)
Admission: EM | Admit: 2011-11-29 | Discharge: 2011-11-29 | Disposition: A | Discharge status: Home / Self Care | Payer: Medicare Other | Source: Home / Self Care | Attending: Emergency Medicine | Admitting: Emergency Medicine

## 2011-11-29 ENCOUNTER — Emergency Department (INDEPENDENT_AMBULATORY_CARE_PROVIDER_SITE_OTHER): Payer: Medicare Other

## 2011-11-29 DIAGNOSIS — R05 Cough: Secondary | ICD-10-CM | POA: Diagnosis not present

## 2011-11-29 DIAGNOSIS — J209 Acute bronchitis, unspecified: Secondary | ICD-10-CM

## 2011-11-29 DIAGNOSIS — R509 Fever, unspecified: Secondary | ICD-10-CM | POA: Diagnosis not present

## 2011-11-29 MED ORDER — BENZONATATE 200 MG PO CAPS: 200.0000 mg | ORAL_CAPSULE | Freq: Three times a day (TID) | ORAL | Status: DC | PRN

## 2011-11-29 MED ORDER — AZITHROMYCIN 250 MG PO TABS: ORAL_TABLET | ORAL | Status: DC

## 2011-11-29 MED ORDER — ALBUTEROL SULFATE HFA 108 (90 BASE) MCG/ACT IN AERS: 1.0000 | INHALATION_SPRAY | Freq: Four times a day (QID) | RESPIRATORY_TRACT | Status: DC | PRN

## 2011-11-29 NOTE — ED Provider Notes (Signed)
Chief Complaint  Patient presents with  . Cough    History of Present Illness:   Alice [name] is a 78 year old female who presents with a two-week history of cough productive of small amounts of clear yellow sputum, wheezing, and chest tightness. She also has had ear pressure, fullness in her head, nasal congestion without any drainage, headache, sore throat, pain on swallowing, swollen lymph nodes in her neck, temperature of up to 101 which occurred this past Monday, 4 days ago, and some nausea. She has not had vomiting, diarrhea, or chest pain.  Review of Systems:  Other than noted above, the patient denies any of the following symptoms. Systemic:  No fever, chills, sweats, fatigue, myalgias, headache, or anorexia. Eye:  No redness, pain or drainage. ENT:  No earache, ear congestion, nasal congestion, sneezing, rhinorrhea, sinus pressure, sinus pain, post nasal drip, or sore throat. Lungs:  No cough, sputum production, wheezing, shortness of breath, or chest pain. GI:  No abdominal pain, nausea, vomiting, or diarrhea.  PMFSH:  Past medical history, family history, social history, meds, and allergies were reviewed.  Physical Exam:   Vital signs:  BP 162/80  Pulse 84  Temp 97.9 F (36.6 C) (Oral)  Resp 16  SpO2 97% General:  Alert, in no distress. Eye:  No conjunctival injection or drainage. Lids were normal. ENT:  TMs and canals were normal, without erythema or inflammation.  Nasal mucosa was clear and uncongested, without drainage.  Mucous membranes were moist.  Pharynx was clear, without exudate or drainage.  There were no oral ulcerations or lesions. Neck:  Supple, no adenopathy, tenderness or mass. Lungs:  No respiratory distress.  Lungs were clear to auscultation, without wheezes, rales or rhonchi.  Breath sounds were clear and equal bilaterally.  Heart:  Regular rhythm, without gallops, murmers or rubs. Skin:  Clear, warm, and dry, without rash or lesions.  Labs:   Results  for orders placed during the hospital encounter of 11/29/11  POCT RAPID STREP A ([facility] CARE ONLY)      Component Value Range   Streptococcus, Group A Screen (Direct) NEGATIVE  NEGATIVE    Radiology:  Dg Chest 2 View  11/29/2011  *RADIOLOGY REPORT*  Clinical Data: 78 year old female cough and low grade fever.  CHEST - 2 VIEW  Comparison: 01/22/2011 and earlier.  Findings: Stable lung volumes.  Stable kyphoscoliosis.  Mild tortuosity of the thoracic aorta. Other mediastinal contours are within normal limits.  Visualized tracheal air column is within normal limits.  No pneumothorax, pulmonary edema, pleural effusion, consolidation or confluent pulmonary opacity. No acute osseous abnormality identified.  IMPRESSION: No acute cardiopulmonary abnormality.   Original Report Authenticated By: Harley Hallmark, M.D.    I reviewed the images independently and personally and concur with the radiologist's findings.  Assessment:  The encounter diagnosis was Acute bronchitis.  Plan:   1.  The following meds were prescribed:   New Prescriptions   ALBUTEROL (PROVENTIL HFA;VENTOLIN HFA) 108 (90 BASE) MCG/ACT INHALER    Inhale 1-2 puffs into the lungs every 6 (six) hours as needed for wheezing.   AZITHROMYCIN (ZITHROMAX Z-PAK) 250 MG TABLET    Take as directed.   BENZONATATE (TESSALON) 200 MG CAPSULE    Take 1 capsule (200 mg total) by mouth 3 (three) times daily as needed for cough.   2.  The patient was instructed in symptomatic care and handouts were given. 3.  The patient was told to return if becoming worse in any way,  if no better in 3 or 4 days, and given some red flag symptoms that would indicate earlier return.   Reuben Likes, MD 11/29/11 2239

## 2011-11-29 NOTE — ED Notes (Signed)
Patient complains of cough since last Saturday night, states sometimes productive yellowish in color

## 2011-12-03 ENCOUNTER — Other Ambulatory Visit: Payer: Self-pay | Admitting: *Deleted

## 2011-12-03 ENCOUNTER — Telehealth: Payer: Self-pay | Admitting: *Deleted

## 2011-12-03 NOTE — Progress Notes (Signed)
Message left by pt inquiring " what primary MD would Dr Darnelle Catalan recommend " " he mentioned someone at my last visit but I forgot who ".  Note states she called last week and did not receive a return call.  Per MD he gave name for Dr Marlan Palau.  Request placed per [facility] request for referral to above MD.

## 2011-12-04 ENCOUNTER — Other Ambulatory Visit: Payer: Self-pay | Admitting: *Deleted

## 2011-12-04 ENCOUNTER — Telehealth: Payer: Self-pay | Admitting: Oncology

## 2011-12-04 NOTE — Telephone Encounter (Signed)
S/w dr baxley's office and they are not  accepting any new pt's at this time. Made the desk nurse aware.

## 2012-01-07 DIAGNOSIS — S8263XA Displaced fracture of lateral malleolus of unspecified fibula, initial encounter for closed fracture: Secondary | ICD-10-CM | POA: Diagnosis not present

## 2012-01-08 DIAGNOSIS — D3130 Benign neoplasm of unspecified choroid: Secondary | ICD-10-CM | POA: Diagnosis not present

## 2012-01-08 NOTE — Telephone Encounter (Signed)
No entry 

## 2012-01-10 DIAGNOSIS — M79609 Pain in unspecified limb: Secondary | ICD-10-CM | POA: Diagnosis not present

## 2012-01-10 DIAGNOSIS — B351 Tinea unguium: Secondary | ICD-10-CM | POA: Diagnosis not present

## 2012-01-16 DIAGNOSIS — S8263XA Displaced fracture of lateral malleolus of unspecified fibula, initial encounter for closed fracture: Secondary | ICD-10-CM | POA: Diagnosis not present

## 2012-01-28 ENCOUNTER — Other Ambulatory Visit: Payer: Self-pay | Admitting: Oncology

## 2012-01-28 DIAGNOSIS — C50419 Malignant neoplasm of upper-outer quadrant of unspecified female breast: Secondary | ICD-10-CM

## 2012-02-12 ENCOUNTER — Other Ambulatory Visit (HOSPITAL_BASED_OUTPATIENT_CLINIC_OR_DEPARTMENT_OTHER): Payer: Medicare Other | Admitting: Lab

## 2012-02-12 ENCOUNTER — Ambulatory Visit (HOSPITAL_BASED_OUTPATIENT_CLINIC_OR_DEPARTMENT_OTHER): Payer: Medicare Other | Admitting: Physician Assistant

## 2012-02-12 ENCOUNTER — Telehealth: Payer: Self-pay | Admitting: *Deleted

## 2012-02-12 ENCOUNTER — Encounter: Payer: Self-pay | Admitting: Physician Assistant

## 2012-02-12 VITALS — BP 155/80 | HR 71 | Temp 98.3°F | Resp 20

## 2012-02-12 DIAGNOSIS — G4700 Insomnia, unspecified: Secondary | ICD-10-CM | POA: Diagnosis not present

## 2012-02-12 DIAGNOSIS — Z853 Personal history of malignant neoplasm of breast: Secondary | ICD-10-CM

## 2012-02-12 DIAGNOSIS — F419 Anxiety disorder, unspecified: Secondary | ICD-10-CM

## 2012-02-12 DIAGNOSIS — F411 Generalized anxiety disorder: Secondary | ICD-10-CM

## 2012-02-12 DIAGNOSIS — Z170 Estrogen receptor positive status [ER+]: Secondary | ICD-10-CM | POA: Diagnosis not present

## 2012-02-12 DIAGNOSIS — C50419 Malignant neoplasm of upper-outer quadrant of unspecified female breast: Secondary | ICD-10-CM | POA: Diagnosis not present

## 2012-02-12 LAB — CBC WITH DIFFERENTIAL/PLATELET
BASO%: 0.5 % (ref 0.0–2.0)
EOS%: 1.7 % (ref 0.0–7.0)
HCT: 39.1 % (ref 34.8–46.6)
LYMPH%: 35.6 % (ref 14.0–49.7)
MCH: 32.4 pg (ref 25.1–34.0)
MCHC: 33.9 g/dL (ref 31.5–36.0)
MCV: 95.6 fL (ref 79.5–101.0)
MONO#: 0.5 10*3/uL (ref 0.1–0.9)
MONO%: 7.5 % (ref 0.0–14.0)
NEUT%: 54.7 % (ref 38.4–76.8)
Platelets: 168 10*3/uL (ref 145–400)
RBC: 4.09 10*6/uL (ref 3.70–5.45)
WBC: 6.3 10*3/uL (ref 3.9–10.3)

## 2012-02-12 LAB — COMPREHENSIVE METABOLIC PANEL (CC13)
ALT: 16 U/L (ref 0–55)
Alkaline Phosphatase: 36 U/L — ABNORMAL LOW (ref 40–150)
CO2: 31 mEq/L — ABNORMAL HIGH (ref 22–29)
Creatinine: 0.7 mg/dL (ref 0.6–1.1)
Sodium: 143 mEq/L (ref 136–145)
Total Bilirubin: 0.3 mg/dL (ref 0.20–1.20)
Total Protein: 6.2 g/dL — ABNORMAL LOW (ref 6.4–8.3)

## 2012-02-12 MED ORDER — LORAZEPAM 0.5 MG PO TABS: 0.5000 mg | ORAL_TABLET | Freq: Every day | ORAL | Status: DC | PRN

## 2012-02-12 NOTE — Progress Notes (Signed)
ID: [name]   DOB: [birth year]  MR#: [id number]  WUJ#:[id number]  HISTORY OF PRESENT ILLNESS: Ms. Seim noted a mass in her right breast late December of 2011.  She brought this to Dr. Rubye Oaks attention and he set her up for mammography at [facility] in [facility].  This showed a large spiculated mass in the posterior upper outer quadrant of the right breast which was palpable.  By ultrasound it measured 3.6 cm.  It was hypoechoic with internal blood flow.  Biopsy was suggested and performed January 17th, 2012.  The pathology from that biopsy ([id number]) showed an invasive ductal carcinoma, Grade 3, ER 95% and PR 47% positive with an MIB-1 of 50%, no HER2 amplification with a CISH ratio of 1.63.    With this information, the patient was referred to Dr. Donell Beers, and bilateral breast MRIs were obtained March 20, 2010.  This showed in the posterior 3rd of the outer right breast a 3.8 cm mass with a clip artifact and in addition in the axillary tail of the right breast, a 9 mm rounded mildly irregular mass, corresponding to an abnormal appearing lymph node seen at mammography.  There were no other areas suspicious for enhancement.     Her subsequent history is as summarized below  INTERVAL HISTORY: Alois returns today for followup of her right breast carcinoma. She continues on tamoxifen at 20 mg daily which she is tolerating very well.  Interval history is remarkable for [name] having fractured her right ankle approximately 5 or 6 weeks ago. She was at the dog park with her Perlie Gold terrier, lost her balance and fell. The ankle is healing well. She is able to walk on it and has not been sedentary.   Daysi's family is doing well. She plans to travel over Christmas to see her family. She has a granddaughter in [place], [place] who was recently awarded an Product/process development scientist. Her grandson in [place] just past the [place] boards. Her grandson in [place] just finished his masters degree  and is now going for his PhD.   REVIEW OF SYSTEMS: Malasha denies any fevers or chills and has no significant hot flashes. She denies any vaginal dryness, discharge, or bleeding. She's eating and drinking fairly well. She has infrequent nausea but no emesis. She's having regular bowel movements, and has no change in urinary habits. She denies any cough, phlegm production, or chest pain. She has some mild shortness of breath only with exertion. No orthopnea. No peripheral swelling, and no evidence of abnormal clotting. She denies any abnormal headaches and has had no dizziness or change in vision. She continues to followup regularly with her ophthalmologist for some "freckles" which were found on the retina. These continue to be stable on exam. [place] denies any unusual myalgias, arthralgias, or bony pain, other than the pain in the right ankle, and also in the left thumb where she is status post Bite. Finally, she has some "dark spot" on her face she would like for me to look at today. She denies any additional skin changes.  A detailed review of systems is otherwise stable and noncontributory.   PAST MEDICAL HISTORY: Past Medical History  Diagnosis Date  . Hypothyroidism   . Breast cancer      Right , chemo, radiation  . Scoliosis     L4 L5  . History of diverticulitis of colon   . Alopecia   . HTN (hypertension)   . Hypercholesteremia   .  Seasonal allergies   . DDD (degenerative disc disease)   . SUI (stress urinary incontinence, female)   . Heart palpitations   1. Greater than 30 pack-year tobacco abuse, the patient quitting in the sixties.  2. History of sling procedure. 3. Seasonal allergies. 4. Status post hernia repair under Dr. Zachery Dakins. 5. Status post small bowel resection for diverticular disease in 2001.  6. Status post left cataract surgery. 7. History of tonsillectomy. 8. History of palpitations.   9. History of degenerative disk  disease. 10. Hypothyroidism. 11. Hypercholesterolemia. 12. Hypertension. Stress urinary incontinence  PAST SURGICAL HISTORY: Past Surgical History  Procedure Date  . Breast lumpectomy 2012    right  . Tonsillectomy 2001    Diverticulitis  . Hernia repair     RIH, Dr. Zachery Dakins  . Port-a-cath removal   . Cataract extraction     left  . Bladder suspension     FAMILY HISTORY The patient's father died from a stroke at the age of 65.  The patient's mother died with congestive heart failure at the age of 52.  The patient has one sister who died with non-Hodgkin's lymphoma at the age of 46.  She has a brother who was a Clinical cytogeneticist and died from unknown causes.    GYNECOLOGIC HISTORY: She is GX P3, first pregnancy to term at age 64.  She had menarche at age 61.  She went through menopause in her fifties. 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 Son Candie Echevaria lives in [place] where he works in Consulting civil engineer.  He is the patient's healthcare power of attorney and may be reached at [phone].  The patient's son Tawanna Cooler also works in IT in the [place] area.  Son L. Dionicio Stall is a Advice worker [employer].  The patient has 5 grandchildren and 2 great-grandchildren. She attends [employer].      ADVANCED DIRECTIVES:  HEALTH MAINTENANCE: History  Substance Use Topics  . Smoking status: Former Smoker    Quit date: 08/09/1964  . Smokeless tobacco: Never Used  . Alcohol Use: Yes     Comment: occasional     Colonoscopy: never  PAP: 2001  Bone density: within past 5 years  Lipid panel:  Allergies  Allergen Reactions  . Amoxicillin Rash  . Diphenhydramine Rash  . Naproxen Sodium Rash    Current Outpatient Prescriptions  Medication Sig Dispense Refill  . albuterol (PROVENTIL HFA;VENTOLIN HFA) 108 (90 BASE) MCG/ACT inhaler  Inhale 1-2 puffs into the lungs every 6 (six) hours as needed for wheezing.  1 Inhaler  0  . fish oil-omega-3 fatty acids 1000 MG capsule Take 1 g by mouth daily.       Marland Kitchen levothyroxine (SYNTHROID) 100 MCG tablet Take 100 mcg by mouth daily.        Marland Kitchen LORazepam (ATIVAN) 0.5 MG tablet Take 1 tablet (0.5 mg total) by mouth daily as needed for anxiety.  30 tablet  0  . Multiple Vitamin (MULTIVITAMIN PO) Take by mouth daily.        . Naproxen Sodium (ALEVE PO) Take by mouth as needed.        . tamoxifen (NOLVADEX) 20 MG tablet TAKE 1 TABLET BY MOUTH EVERY DAY  90 tablet  0  . venlafaxine (EFFEXOR) 37.5 MG tablet Take 1 tablet (37.5 mg total) by mouth 2 (two) times daily.  60 tablet  6    OBJECTIVE: Middle-aged white woman in no acute distress. Filed Vitals:   02/12/12 1358  BP: 155/80  Pulse: 71  Temp: 98.3 F (36.8 C)  Resp: 20     There is no height or weight on file to calculate BMI.    ECOG FS: 0  Filed Weights   Physical Exam: HEENT:  Sclerae anicteric.  Oropharynx clear.     Nodes:  No cervical, supraclavicular, or axillary lymphadenopathy palpated.  Breast Exam:  Right breast is status post lumpectomy with no evidence of local recurrence. Left breast is benign Lungs:  Clear to auscultation bilaterally.   Heart:  Regular rate and rhythm.   Abdomen:  Soft, nontender.  Positive bowel sounds.  No organomegaly or masses palpated.   Musculoskeletal:  No focal spinal tenderness to palpation.  Extremities: No peripheral edema. Skin:  Mild rosacea noted on the face bilaterally  Neuro:  Nonfocal. Alert and oriented x3.  LAB RESULTS: Lab Results  Component Value Date   WBC 6.3 02/12/2012   NEUTROABS 3.4 02/12/2012   HGB 13.3 02/12/2012   HCT 39.1 02/12/2012   MCV 95.6 02/12/2012   PLT 168 02/12/2012      Chemistry      Component Value Date/Time   NA 143 02/12/2012 1347   NA 138 08/10/2011 1405   K 4.3 02/12/2012 1347   K 3.7 08/10/2011 1405   CL 105 02/12/2012 1347   CL 99  08/10/2011 1405   CO2 31* 02/12/2012 1347   CO2 29 08/10/2011 1405   BUN 25.0 02/12/2012 1347   BUN 21 08/10/2011 1405   CREATININE 0.7 02/12/2012 1347   CREATININE 0.67 08/10/2011 1405      Component Value Date/Time   CALCIUM 9.3 02/12/2012 1347   CALCIUM 9.8 08/10/2011 1405   ALKPHOS 36* 02/12/2012 1347   ALKPHOS 34* 08/10/2011 1054   AST 19 02/12/2012 1347   AST 17 08/10/2011 1054   ALT 16 02/12/2012 1347   ALT 14 08/10/2011 1054   BILITOT 0.30 02/12/2012 1347   BILITOT 0.6 08/10/2011 1054       Lab Results  Component Value Date   LABCA2 19 10/31/2011   STUDIES:  Most recent mammogram 03/29/2011 was unremarkable   ASSESSMENT: 78 year old [place] woman,   (1)  status post right breast biopsy March 17, 2010 for a grade 3 invasive ductal carcinoma measuring 3.8 cm by MRI with a suspicious right axillary lymph node which was, however, negative by biopsy, the tumor being estrogen and progesterone receptor positive, HER2-negative, with an MIB-1 of 50%.    (2)  Neoadjuvantly, she was treated with dose-dense doxorubicin and cyclophosphamide x4 followed by weekly paclitaxel x 12, completed July 2012 followed by definitive right lumpectomy and sentinel lymph node sampling August 2012 showing a complete pathologic response.   (3)  She completed radiation treatments November 2012 and started tamoxifen at that time  PLAN:  Latricia continues to do very well with regards to her breast cancer. She'll continue on her tamoxifen at 20 mg daily. I have refilled her lorazepam as well which she uses primarily at night for anxiety and insomnia.  Shilynn is due for her next annual mammogram in January, and return to see [place] in 6 months, June of 2014. In the meanwhile, she will call with any changes or problems.    Malaysha Arlen    02/12/2012

## 2012-02-12 NOTE — Telephone Encounter (Signed)
Gave patient appointment for 07-2012  Gave patient appointment for mammogram on 03-2012 at the breast center

## 2012-02-13 DIAGNOSIS — S8263XA Displaced fracture of lateral malleolus of unspecified fibula, initial encounter for closed fracture: Secondary | ICD-10-CM | POA: Diagnosis not present

## 2012-02-29 DIAGNOSIS — M47817 Spondylosis without myelopathy or radiculopathy, lumbosacral region: Secondary | ICD-10-CM | POA: Diagnosis not present

## 2012-02-29 DIAGNOSIS — E039 Hypothyroidism, unspecified: Secondary | ICD-10-CM | POA: Diagnosis not present

## 2012-02-29 DIAGNOSIS — R42 Dizziness and giddiness: Secondary | ICD-10-CM | POA: Diagnosis not present

## 2012-02-29 DIAGNOSIS — G2581 Restless legs syndrome: Secondary | ICD-10-CM | POA: Diagnosis not present

## 2012-03-03 DIAGNOSIS — R42 Dizziness and giddiness: Secondary | ICD-10-CM | POA: Diagnosis not present

## 2012-03-03 DIAGNOSIS — I6789 Other cerebrovascular disease: Secondary | ICD-10-CM | POA: Diagnosis not present

## 2012-03-19 DIAGNOSIS — S8263XA Displaced fracture of lateral malleolus of unspecified fibula, initial encounter for closed fracture: Secondary | ICD-10-CM | POA: Diagnosis not present

## 2012-03-31 ENCOUNTER — Ambulatory Visit
Admission: RE | Admit: 2012-03-31 | Discharge: 2012-03-31 | Disposition: A | Discharge status: Home / Self Care | Payer: Medicare Other | Source: Ambulatory Visit | Attending: Physician Assistant | Admitting: Physician Assistant

## 2012-03-31 DIAGNOSIS — Z853 Personal history of malignant neoplasm of breast: Secondary | ICD-10-CM | POA: Diagnosis not present

## 2012-04-17 DIAGNOSIS — I739 Peripheral vascular disease, unspecified: Secondary | ICD-10-CM | POA: Diagnosis not present

## 2012-04-17 DIAGNOSIS — M79609 Pain in unspecified limb: Secondary | ICD-10-CM | POA: Diagnosis not present

## 2012-04-17 DIAGNOSIS — L608 Other nail disorders: Secondary | ICD-10-CM | POA: Diagnosis not present

## 2012-04-17 DIAGNOSIS — B351 Tinea unguium: Secondary | ICD-10-CM | POA: Diagnosis not present

## 2012-04-23 DIAGNOSIS — E039 Hypothyroidism, unspecified: Secondary | ICD-10-CM | POA: Diagnosis not present

## 2012-04-23 DIAGNOSIS — K5732 Diverticulitis of large intestine without perforation or abscess without bleeding: Secondary | ICD-10-CM | POA: Diagnosis not present

## 2012-05-01 DIAGNOSIS — Z0000 Encounter for general adult medical examination without abnormal findings: Secondary | ICD-10-CM | POA: Diagnosis not present

## 2012-05-01 DIAGNOSIS — E039 Hypothyroidism, unspecified: Secondary | ICD-10-CM | POA: Diagnosis not present

## 2012-05-01 DIAGNOSIS — Z1331 Encounter for screening for depression: Secondary | ICD-10-CM | POA: Diagnosis not present

## 2012-05-01 DIAGNOSIS — Z853 Personal history of malignant neoplasm of breast: Secondary | ICD-10-CM | POA: Diagnosis not present

## 2012-05-01 DIAGNOSIS — G2581 Restless legs syndrome: Secondary | ICD-10-CM | POA: Diagnosis not present

## 2012-05-01 DIAGNOSIS — M47817 Spondylosis without myelopathy or radiculopathy, lumbosacral region: Secondary | ICD-10-CM | POA: Diagnosis not present

## 2012-05-01 DIAGNOSIS — R42 Dizziness and giddiness: Secondary | ICD-10-CM | POA: Diagnosis not present

## 2012-05-01 DIAGNOSIS — R030 Elevated blood-pressure reading, without diagnosis of hypertension: Secondary | ICD-10-CM | POA: Diagnosis not present

## 2012-05-05 DIAGNOSIS — Z1212 Encounter for screening for malignant neoplasm of rectum: Secondary | ICD-10-CM | POA: Diagnosis not present

## 2012-05-05 LAB — IFOBT (OCCULT BLOOD): IFOBT: NEGATIVE

## 2012-05-26 DIAGNOSIS — E785 Hyperlipidemia, unspecified: Secondary | ICD-10-CM | POA: Diagnosis not present

## 2012-05-26 DIAGNOSIS — I10 Essential (primary) hypertension: Secondary | ICD-10-CM | POA: Diagnosis not present

## 2012-06-26 DIAGNOSIS — I739 Peripheral vascular disease, unspecified: Secondary | ICD-10-CM | POA: Diagnosis not present

## 2012-06-26 DIAGNOSIS — L608 Other nail disorders: Secondary | ICD-10-CM | POA: Diagnosis not present

## 2012-06-26 DIAGNOSIS — M79609 Pain in unspecified limb: Secondary | ICD-10-CM | POA: Diagnosis not present

## 2012-07-01 DIAGNOSIS — E785 Hyperlipidemia, unspecified: Secondary | ICD-10-CM | POA: Diagnosis not present

## 2012-07-01 DIAGNOSIS — I10 Essential (primary) hypertension: Secondary | ICD-10-CM | POA: Diagnosis not present

## 2012-07-02 DIAGNOSIS — L03039 Cellulitis of unspecified toe: Secondary | ICD-10-CM | POA: Diagnosis not present

## 2012-07-02 DIAGNOSIS — M79609 Pain in unspecified limb: Secondary | ICD-10-CM | POA: Diagnosis not present

## 2012-07-14 DIAGNOSIS — T8189XA Other complications of procedures, not elsewhere classified, initial encounter: Secondary | ICD-10-CM | POA: Diagnosis not present

## 2012-07-22 IMAGING — CT CT CHEST W/ CM
2 of 3 series · 15 of 36 positions shown, 18 images · IV contrast (agent unspecified)
Comparison: Today's PET, dictated separately.  Plain film chest of
03/27/2010.  Chest CT of 03/10/2008.

CLINICAL DATA: New diagnosis of right-sided breast cancer.  Ex-
smoker.

CT CHEST WITH CONTRAST
TECHNIQUE: Multidetector CT imaging of the chest was performed
following the standard protocol during bolus administration of
intravenous contrast.
Contrast: 80 ml [id number]

[Series 2: chest with st · axial · 0.74mm/px · z∈[-428,-188]mm · 12 of 58 slices shown, 15 images]
[im 5/58  mediastinal]
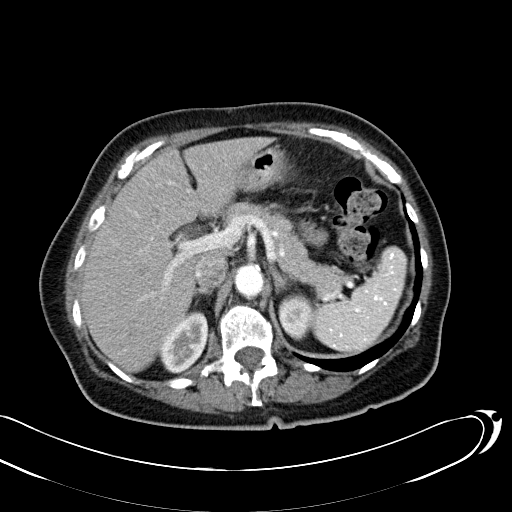
[im 5/58  lung]
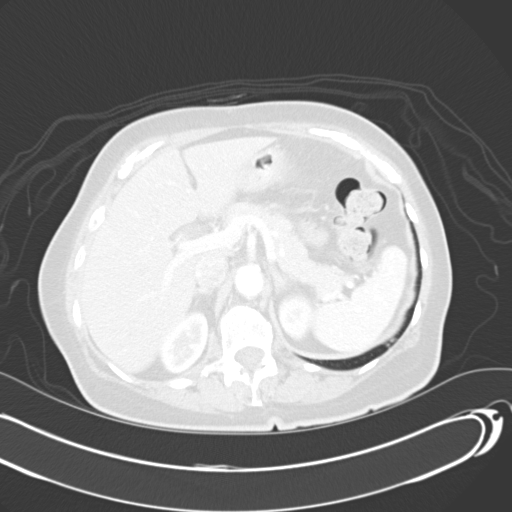
[im 9/58  lung]
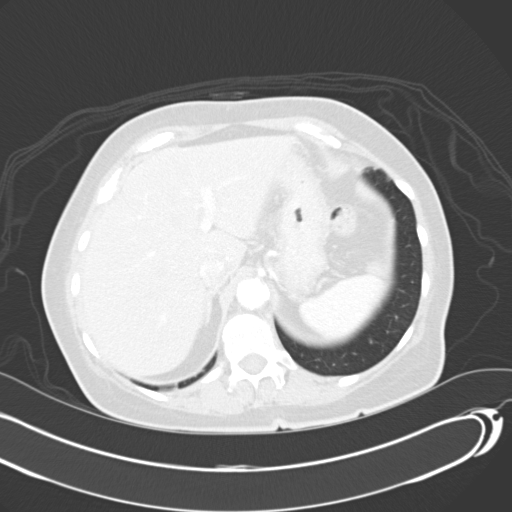
[im 13/58  lung]
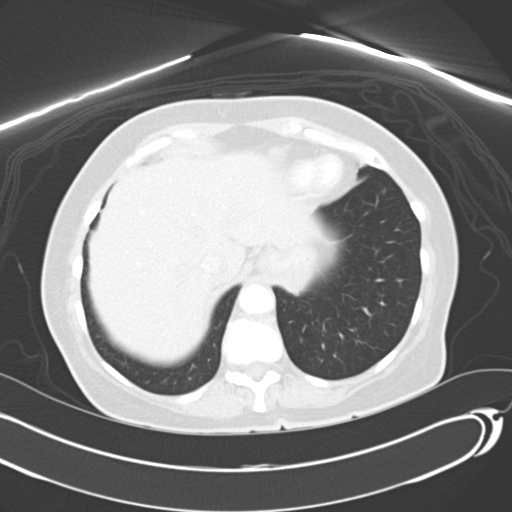
[im 17/58  lung]
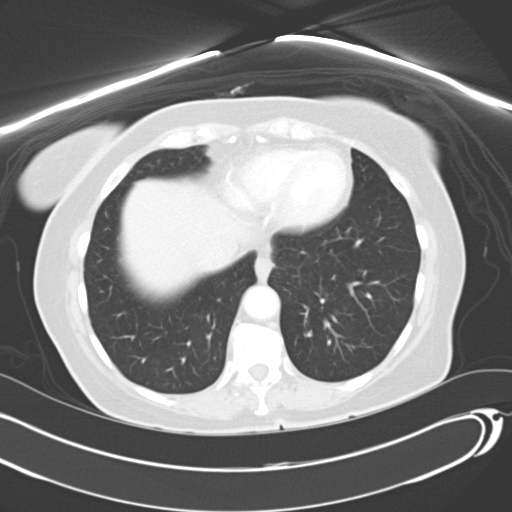
[im 22/58  mediastinal]
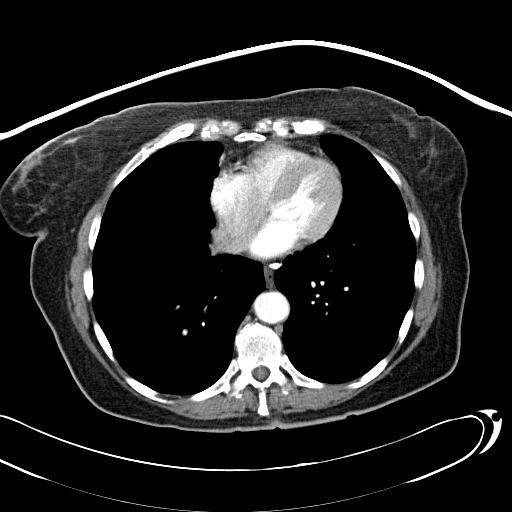
[im 22/58  lung]
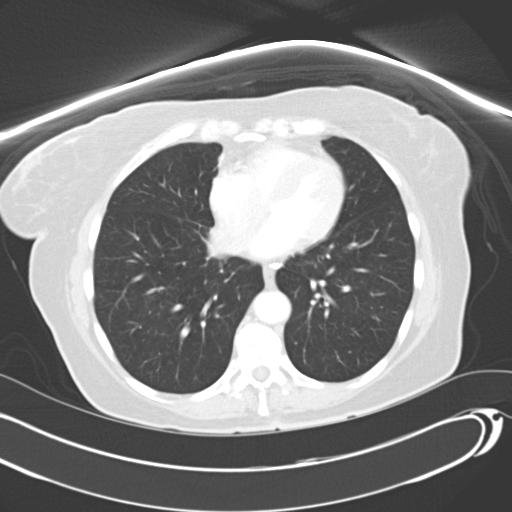
[im 26/58  lung]
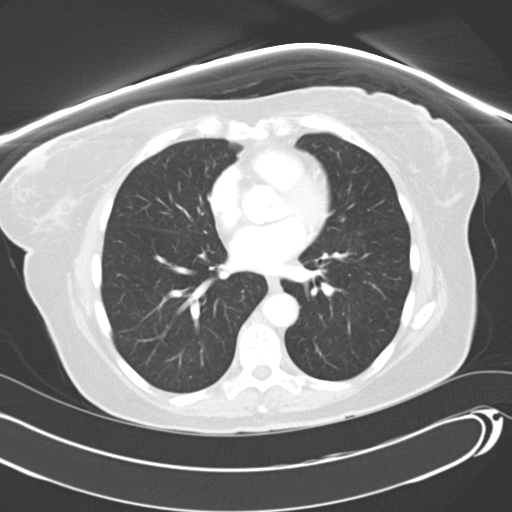
[im 32/58  lung]
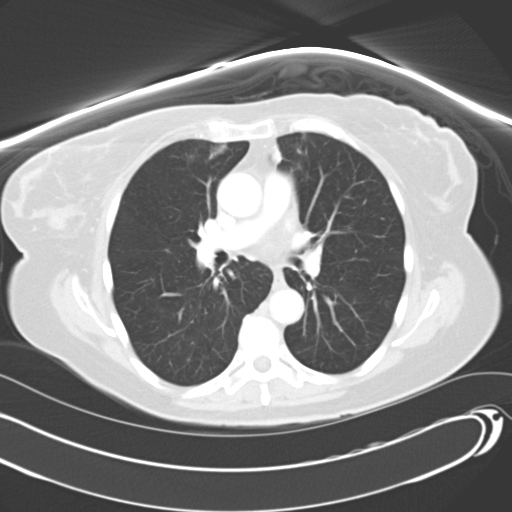
[im 36/58  lung]
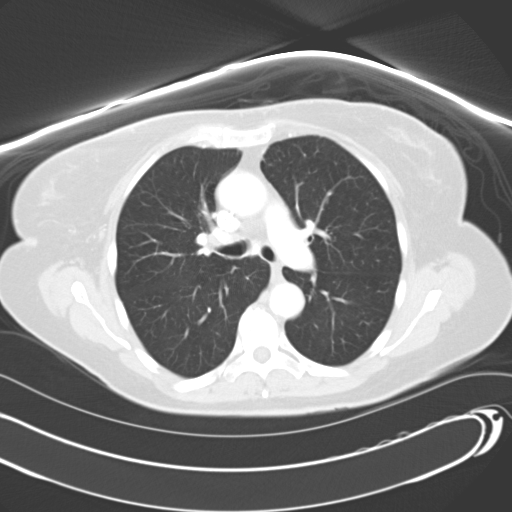
[im 41/58  mediastinal]
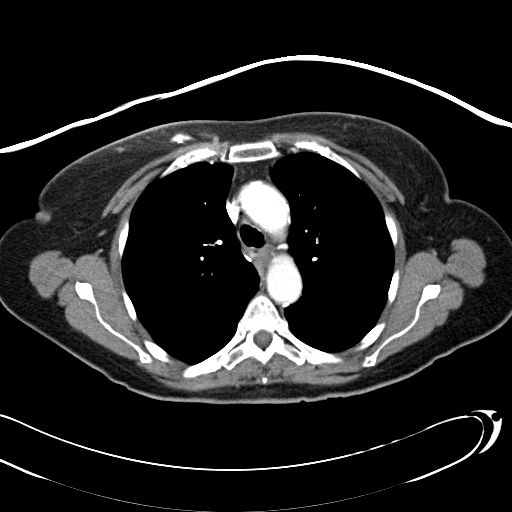
[im 41/58  lung]
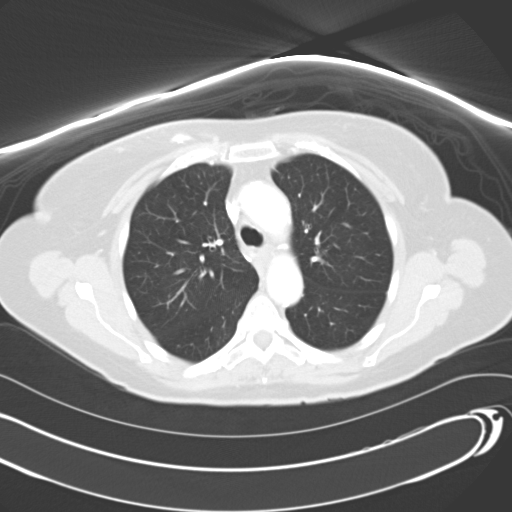
[im 45/58  lung]
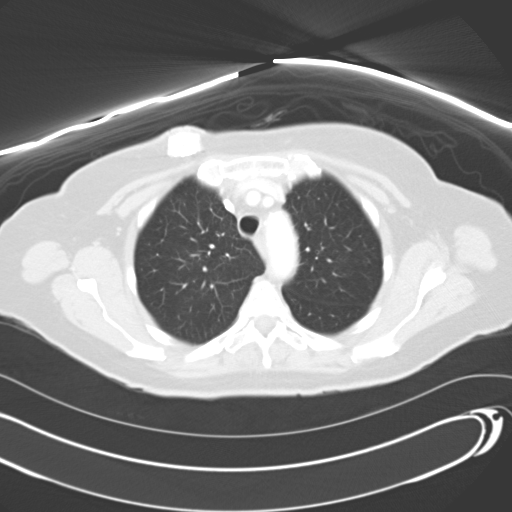
[im 49/58  lung]
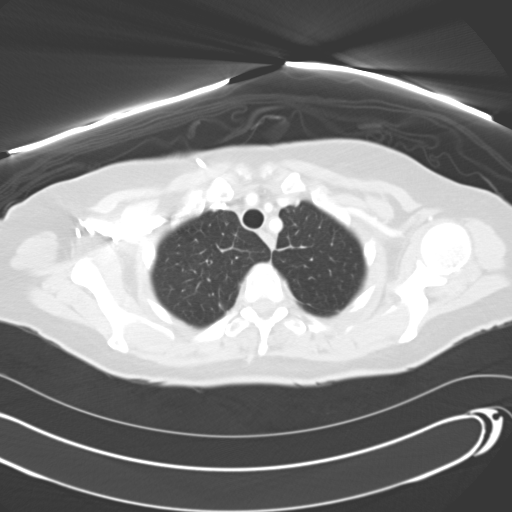
[im 53/58  lung]
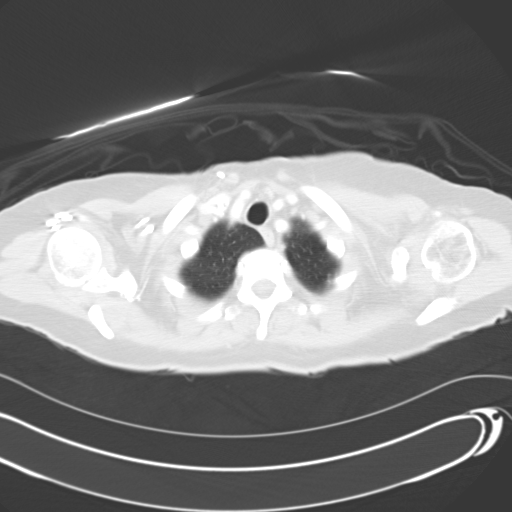

[Series 602: <mpr thick range> · coronal · 0.74mm/px · 3 of 84 slices shown]
[im 17/84  lung]
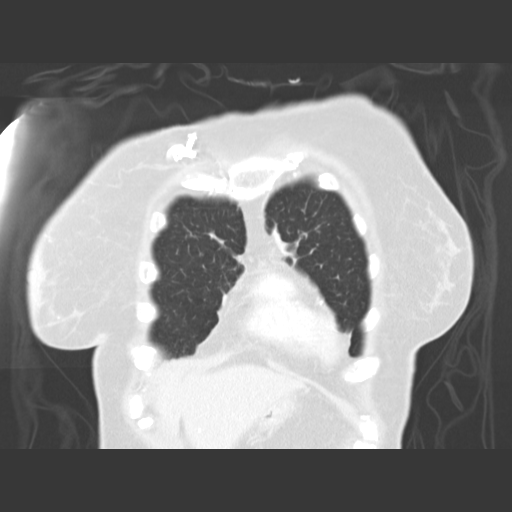
[im 34/84  lung]
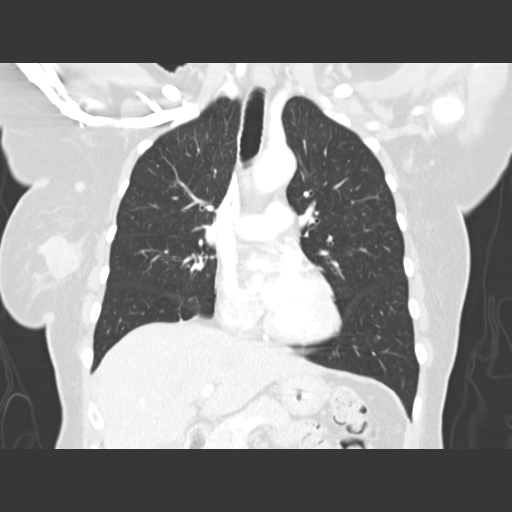
[im 50/84  lung]
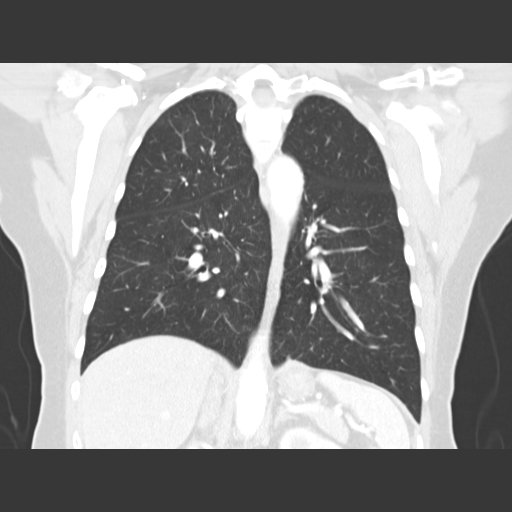

[15 of 36 positions shown; findings below may reference images not displayed]

FINDINGS: Lung windows demonstrate mild centrilobular emphysema.
Pleural parenchymal scarring in the apices bilaterally.
Stable volume loss in the right middle lobe.  Ground-glass opacity
within the superior segment left lower lobe on image 30 measures
less than 1 cm and is similar to on the prior exam.  Likely post
infectious or inflammatory, given its small size.

Soft tissue windows demonstrate a right-sided Port-A-Cath which
terminates at the low SVC.  No axillary adenopathy.  Right breast
primary, which measures 2.7 cm on image 29.

Normal heart size without pericardial or pleural effusion.
Multivessel coronary artery atherosclerosis.  Similar calcified
subcarinal and left infrahilar lymph nodes. No mediastinal or hilar
adenopathy.  No internal mammary adenopathy.

Limited abdominal imaging demonstrates hepatic cysts, one of which
is incompletely imaged.  Old granulomatous disease in the spleen.
Common duct upper limits of normal in size but unchanged at 8 mm.
Severe thoracolumbar degenerative disc disease.
IMPRESSION: Right breast primary without evidence of metastatic disease within
the chest.

A sub cm focus of ground-glass opacity in the left lower lobe is
likely post infectious or inflammatory and is similar to
03/10/2008. Recommend attention on follow-up.

## 2012-07-22 IMAGING — PT NM PET TUM IMG INITIAL (PI) SKULL BASE T - THIGH
1 of 6 series · 1 of 25 positions shown · non-contrast
Comparison: Today's diagnostic chest CT, dictated separately.

CLINICAL DATA: Initial treatment strategy for new diagnosis of
right-sided breast cancer.

NUCLEAR MEDICINE PET CT SKULL BASE TO THIGH
TECHNIQUE: 17.8 mCi F-18 FDG was injected intravenously via the
right AC.  Full-ring PET imaging was performed from the skull base
through the mid-thighs 55  minutes after injection.  CT data was
obtained and used for attenuation correction and anatomic
localization only.  (This was not acquired as a diagnostic CT
examination.)
Fasting Blood Glucose:  100

[Series 2: ct images · axial · 3.8mm · 0.98mm/px · 1 of 223 slices shown]
[im 223/223  brain]
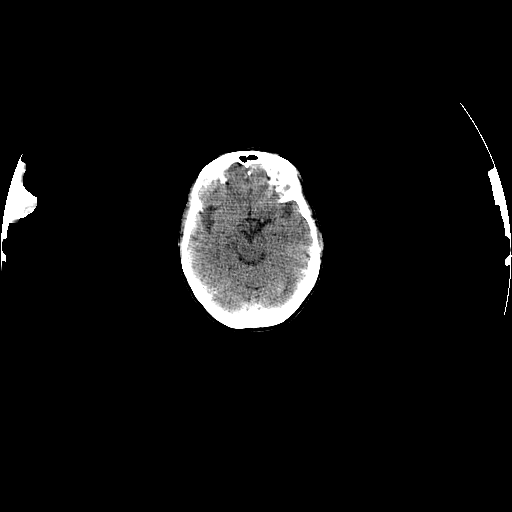

[1 of 25 positions shown; findings below may reference images not displayed]

FINDINGS: PET images demonstrate muscular activity at the skull
base.

The right breast primary measures 2.8 cm and a S.U.V. max of 5.9,
including on image 94.

No abnormal axillary or mediastinal nodal hypermetabolism.

A low density hepatic lesion is not significantly hypermetabolic.
No abnormal activity within the pelvis.

CT images performed for attenuation correction demonstrate no
significant findings in the neck.  Chest findings deferred to
today's diagnostic chest CT, dictated separately. Colonic stool
burden suggests constipation.
IMPRESSION: Right breast primary without hypermetabolic metastasis.

## 2012-07-28 DIAGNOSIS — H52209 Unspecified astigmatism, unspecified eye: Secondary | ICD-10-CM | POA: Diagnosis not present

## 2012-07-28 DIAGNOSIS — D3130 Benign neoplasm of unspecified choroid: Secondary | ICD-10-CM | POA: Diagnosis not present

## 2012-07-28 DIAGNOSIS — Z961 Presence of intraocular lens: Secondary | ICD-10-CM | POA: Diagnosis not present

## 2012-07-30 DIAGNOSIS — T8189XA Other complications of procedures, not elsewhere classified, initial encounter: Secondary | ICD-10-CM | POA: Diagnosis not present

## 2012-08-04 ENCOUNTER — Other Ambulatory Visit (HOSPITAL_BASED_OUTPATIENT_CLINIC_OR_DEPARTMENT_OTHER): Payer: Medicare Other | Admitting: Lab

## 2012-08-04 DIAGNOSIS — C50419 Malignant neoplasm of upper-outer quadrant of unspecified female breast: Secondary | ICD-10-CM | POA: Diagnosis not present

## 2012-08-04 LAB — CBC WITH DIFFERENTIAL/PLATELET
BASO%: 0.7 % (ref 0.0–2.0)
Eosinophils Absolute: 0.3 10*3/uL (ref 0.0–0.5)
HCT: 39.9 % (ref 34.8–46.6)
LYMPH%: 32.7 % (ref 14.0–49.7)
MCHC: 34.2 g/dL (ref 31.5–36.0)
MCV: 92.3 fL (ref 79.5–101.0)
MONO#: 0.5 10*3/uL (ref 0.1–0.9)
MONO%: 6.6 % (ref 0.0–14.0)
NEUT%: 56.1 % (ref 38.4–76.8)
Platelets: 178 10*3/uL (ref 145–400)
WBC: 6.9 10*3/uL (ref 3.9–10.3)

## 2012-08-04 LAB — COMPREHENSIVE METABOLIC PANEL (CC13)
Alkaline Phosphatase: 35 U/L — ABNORMAL LOW (ref 40–150)
CO2: 30 mEq/L — ABNORMAL HIGH (ref 22–29)
Creatinine: 0.8 mg/dL (ref 0.6–1.1)
Glucose: 116 mg/dl — ABNORMAL HIGH (ref 70–99)
Total Bilirubin: 0.36 mg/dL (ref 0.20–1.20)

## 2012-08-11 ENCOUNTER — Ambulatory Visit (HOSPITAL_BASED_OUTPATIENT_CLINIC_OR_DEPARTMENT_OTHER): Payer: Medicare Other | Admitting: Oncology

## 2012-08-11 ENCOUNTER — Telehealth: Payer: Self-pay | Admitting: *Deleted

## 2012-08-11 VITALS — BP 167/77 | HR 68 | Temp 97.7°F | Resp 20 | Ht 61.0 in | Wt 129.6 lb

## 2012-08-11 DIAGNOSIS — C50411 Malignant neoplasm of upper-outer quadrant of right female breast: Secondary | ICD-10-CM

## 2012-08-11 DIAGNOSIS — C50419 Malignant neoplasm of upper-outer quadrant of unspecified female breast: Secondary | ICD-10-CM

## 2012-08-11 DIAGNOSIS — E559 Vitamin D deficiency, unspecified: Secondary | ICD-10-CM | POA: Diagnosis not present

## 2012-08-11 DIAGNOSIS — M8580 Other specified disorders of bone density and structure, unspecified site: Secondary | ICD-10-CM

## 2012-08-11 DIAGNOSIS — M949 Disorder of cartilage, unspecified: Secondary | ICD-10-CM | POA: Diagnosis not present

## 2012-08-11 DIAGNOSIS — M899 Disorder of bone, unspecified: Secondary | ICD-10-CM

## 2012-08-11 MED ORDER — LORAZEPAM 0.5 MG PO TABS: 0.5000 mg | ORAL_TABLET | Freq: Every day | ORAL | Status: DC | PRN

## 2012-08-11 NOTE — Progress Notes (Signed)
ID: Alice [name]   DOB: 05-27-1930  MR#: [id number]  WUJ#:[id number]  HISTORY OF PRESENT ILLNESS: Alice [name] noted a mass in her right breast late December of 2011.  She brought this to Dr. Rubye Oaks attention and he set her up for mammography at [facility] in [facility].  This showed a large spiculated mass in the posterior upper outer quadrant of the right breast which was palpable.  By ultrasound it measured 3.6 cm.  It was hypoechoic with internal blood flow.  Biopsy was suggested and performed January 17th, 2012.  The pathology from that biopsy ([id number]) showed an invasive ductal carcinoma, Grade 3, ER 95% and PR 47% positive with an MIB-1 of 50%, no HER2 amplification with a CISH ratio of 1.63.    With this information, the patient was referred to Dr. Donell Beers, and bilateral breast MRIs were obtained March 20, 2010.  This showed in the posterior 3rd of the outer right breast a 3.8 cm mass with a clip artifact and in addition in the axillary tail of the right breast, a 9 mm rounded mildly irregular mass, corresponding to an abnormal appearing lymph node seen at mammography.  There were no other areas suspicious for enhancement.     Her subsequent history is as summarized below  INTERVAL HISTORY: Alice [name] returns today for followup of her right breast carcinoma accompanied by her significant other and constant come anion, Max, who is wearing a collar that states he is "in training" as a therapy dog.   REVIEW OF SYSTEMS: Alice [name] reports no symptoms suggestive of disease progression or recurrence, and no significant problems from the tamoxifen. She's had some dry skin on her face, involving both cheeks, but this is a ready cleared on the right without any intervention. Her nails are brittle. She still occasionally has nighttime anxiety and those night she takes an Ativan with good effect. Her feet swell occasionally, but not chronically. She has stress urinary incontinence. Otherwise a detailed  review of systems today was noncontributory   PAST MEDICAL HISTORY: Past Medical History  Diagnosis Date  . Hypothyroidism   . Breast cancer      Right , chemo, radiation  . Scoliosis     L4 L5  . History of diverticulitis of colon   . Alopecia   . HTN (hypertension)   . Hypercholesteremia   . Seasonal allergies   . DDD (degenerative disc disease)   . SUI (stress urinary incontinence, female)   . Heart palpitations   1. Greater than 30 pack-year tobacco abuse, the patient quitting in the sixties.  2. History of sling procedure. 3. Seasonal allergies. 4. Status post hernia repair under Dr. Zachery Dakins. 5. Status post small bowel resection for diverticular disease in 2001.  6. Status post left cataract surgery. 7. History of tonsillectomy. 8. History of palpitations.   9. History of degenerative disk disease. 10. Hypothyroidism. 11. Hypercholesterolemia. 12. Hypertension. Stress urinary incontinence  PAST SURGICAL HISTORY: Past Surgical History  Procedure Laterality Date  . Breast lumpectomy  2012    right  . Tonsillectomy  2001    Diverticulitis  . Hernia repair      RIH, Dr. Zachery Dakins  . Port-a-cath removal    . Cataract extraction      left  . Bladder suspension      FAMILY HISTORY The patient's father died from a stroke at the age of 62.  The patient's mother died with congestive heart failure at the age of 41.  The patient  has one sister who died with non-Hodgkin's lymphoma at the age of 27.  She has a brother who was a Clinical cytogeneticist and died from unknown causes.    GYNECOLOGIC HISTORY: She is GX P3, first pregnancy to term at age 61.  She had menarche at age 71.  She went through menopause in her fifties. 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 Son Candie Echevaria lives in [place] where he works in Consulting civil engineer.  He is the patient's  healthcare power of attorney and may be reached at [phone].  The patient's son Tawanna Cooler also works in IT in the [place] area.  Son L. Dionicio Stall is a Advice worker [employer].  The patient has 5 grandchildren and 2 great-grandchildren. She attends [employer].      ADVANCED DIRECTIVES: In place  HEALTH MAINTENANCE: History  Substance Use Topics  . Smoking status: Former Smoker    Quit date: 08/09/1964  . Smokeless tobacco: Never Used  . Alcohol Use: Yes     Comment: occasional     Colonoscopy: never  PAP: 2001  Bone density: within past 5 years  Lipid panel:  Allergies  Allergen Reactions  . Amoxicillin Rash  . Diphenhydramine Rash    "It is the red dye in Benadryl that I''m allergic to"  . Naproxen Sodium Rash    "It is the blue dye in naproxen that I'm allergic to."    Current Outpatient Prescriptions  Medication Sig Dispense Refill  . albuterol (PROVENTIL HFA;VENTOLIN HFA) 108 (90 BASE) MCG/ACT inhaler Inhale 1-2 puffs into the lungs every 6 (six) hours as needed for wheezing.  1 Inhaler  0  . fish oil-omega-3 fatty acids 1000 MG capsule Take 1 g by mouth daily.       Marland Kitchen levothyroxine (SYNTHROID) 100 MCG tablet Take 100 mcg by mouth daily.        Marland Kitchen LORazepam (ATIVAN) 0.5 MG tablet Take 1 tablet (0.5 mg total) by mouth daily as needed for anxiety.  30 tablet  0  . Multiple Vitamin (MULTIVITAMIN PO) Take by mouth daily.        . Naproxen Sodium (ALEVE PO) Take by mouth as needed.        . tamoxifen (NOLVADEX) 20 MG tablet TAKE 1 TABLET BY MOUTH EVERY DAY  90 tablet  0  . venlafaxine (EFFEXOR) 37.5 MG tablet Take 1 tablet (37.5 mg total) by mouth 2 (two) times daily.  60 tablet  6   No current facility-administered medications for this visit.    OBJECTIVE: Middle-aged white woman in no acute distress. Filed Vitals:   08/11/12 1344  BP: 167/77  Pulse: 68  Temp: 97.7 F (36.5 C)  Resp: 20     Body mass index is 24.5 kg/(m^2).    ECOG FS:  0  Filed Weights   08/11/12 1344  Weight: 129 lb 9.6 oz (58.786 kg)   Sclerae unicteric Oropharynx clear No cervical or supraclavicular adenopathy Lungs no rales or rhonchi Heart regular rate and rhythm Abd benign MSK no focal spinal tenderness, no peripheral edema Neuro: nonfocal Breasts: The right breast is status post lumpectomy and radiation. There is no evidence of local recurrence. The right axilla is benign. The left breast is unremarkable.  LAB RESULTS: Lab Results  Component Value Date   WBC 6.9 08/04/2012   NEUTROABS 3.9 08/04/2012   HGB  13.7 08/04/2012   HCT 39.9 08/04/2012   MCV 92.3 08/04/2012   PLT 178 08/04/2012      Chemistry      Component Value Date/Time   NA 141 08/04/2012 1350   NA 138 08/10/2011 1405   K 3.9 08/04/2012 1350   K 3.7 08/10/2011 1405   CL 104 08/04/2012 1350   CL 99 08/10/2011 1405   CO2 30* 08/04/2012 1350   CO2 29 08/10/2011 1405   BUN 23.8 08/04/2012 1350   BUN 21 08/10/2011 1405   CREATININE 0.8 08/04/2012 1350   CREATININE 0.67 08/10/2011 1405      Component Value Date/Time   CALCIUM 9.4 08/04/2012 1350   CALCIUM 9.8 08/10/2011 1405   ALKPHOS 35* 08/04/2012 1350   ALKPHOS 34* 08/10/2011 1054   AST 18 08/04/2012 1350   AST 17 08/10/2011 1054   ALT 17 08/04/2012 1350   ALT 14 08/10/2011 1054   BILITOT 0.36 08/04/2012 1350   BILITOT 0.6 08/10/2011 1054       Lab Results  Component Value Date   LABCA2 19 10/31/2011   STUDIES: No results found. Most recent mammogram 03/31/2012 was unremarkable  ASSESSMENT: 79 y.o. Alice [name] woman,   (1)  status post right breast biopsy March 17, 2010 for a grade 3 invasive ductal carcinoma measuring 3.8 cm by MRI with a suspicious right axillary lymph node which was, however, negative by biopsy, the tumor being estrogen and progesterone receptor positive, HER2-negative, with an MIB-1 of 50%.    (2)  Neoadjuvantly, she was treated with dose-dense doxorubicin and cyclophosphamide x4 followed by weekly paclitaxel x 12,  completed July 2012 followed by definitive right lumpectomy and sentinel lymph node sampling August 2012 showing a complete pathologic response.   (3)  She completed radiation treatments November 2012 and started tamoxifen at that time  PLAN:  Alice [name] is doing very well from a breast cancer point of view, with no evidence of disease recurrence now nearly 2 years out from her definitive surgery. She is tolerating the tamoxifen well, and the plan is to consider switching to an aromatase inhibitor August of this year. With that in mind I have set her up for a bone density and she will call for results. We are faxing refills on her tamoxifen and [facility]azepam [facility]. Incidentally, she brought me a transcript of a talk she gave at [facility] recently. I am scanning it into her chart.  Alice [name] chiefly to make sure she is tolerating the anastrozole well. She knows to call for any problems that may develop before the next visit here.    MAGRINAT,GUSTAV C    08/11/2012

## 2012-08-11 NOTE — Telephone Encounter (Signed)
appts made and printed...td 

## 2012-08-15 DIAGNOSIS — T8189XA Other complications of procedures, not elsewhere classified, initial encounter: Secondary | ICD-10-CM | POA: Diagnosis not present

## 2012-08-27 ENCOUNTER — Ambulatory Visit
Admission: RE | Admit: 2012-08-27 | Discharge: 2012-08-27 | Disposition: A | Discharge status: Home / Self Care | Payer: Medicare Other | Source: Ambulatory Visit | Attending: Oncology | Admitting: Oncology

## 2012-08-27 DIAGNOSIS — C50411 Malignant neoplasm of upper-outer quadrant of right female breast: Secondary | ICD-10-CM

## 2012-08-27 DIAGNOSIS — Z780 Asymptomatic menopausal state: Secondary | ICD-10-CM | POA: Diagnosis not present

## 2012-08-27 DIAGNOSIS — M8580 Other specified disorders of bone density and structure, unspecified site: Secondary | ICD-10-CM

## 2012-09-03 ENCOUNTER — Other Ambulatory Visit: Payer: Self-pay | Admitting: Emergency Medicine

## 2012-09-03 ENCOUNTER — Telehealth: Payer: Self-pay | Admitting: Emergency Medicine

## 2012-09-03 MED ORDER — ANASTROZOLE 1 MG PO TABS: 1.0000 mg | ORAL_TABLET | Freq: Every day | ORAL | Status: DC

## 2012-09-03 NOTE — Telephone Encounter (Signed)
Patient called inquiring about recent bone density results and asking if she can now switch to Arimidex from Tamoxifen. Bone Density results given to Dr Darnelle Catalan for review; per Dr Darnelle Catalan, patient can stop the Tamoxifen for 6 weeks and then start the Arimidex as directed. A prescription for Arimidex sent to her pharmacy. Patient instructed to call with any other questions or concerns and to return for follow up as previously scheduled.

## 2012-09-04 DIAGNOSIS — I739 Peripheral vascular disease, unspecified: Secondary | ICD-10-CM | POA: Diagnosis not present

## 2012-09-04 DIAGNOSIS — L608 Other nail disorders: Secondary | ICD-10-CM | POA: Diagnosis not present

## 2012-09-30 DIAGNOSIS — R21 Rash and other nonspecific skin eruption: Secondary | ICD-10-CM | POA: Diagnosis not present

## 2012-10-02 ENCOUNTER — Telehealth: Payer: Self-pay | Admitting: *Deleted

## 2012-10-02 NOTE — Telephone Encounter (Signed)
Pt called to this RN to inquire if okay to wait to start new AI until early September 2014 due to upcoming travel plans. Alice [name] was to start medication 10/15/2012.  Per review with MD above is appropriate.  This RN returned call to pt and obtained VM- message left.

## 2012-10-31 DIAGNOSIS — Z23 Encounter for immunization: Secondary | ICD-10-CM | POA: Diagnosis not present

## 2012-10-31 DIAGNOSIS — I10 Essential (primary) hypertension: Secondary | ICD-10-CM | POA: Diagnosis not present

## 2012-10-31 DIAGNOSIS — E039 Hypothyroidism, unspecified: Secondary | ICD-10-CM | POA: Diagnosis not present

## 2012-10-31 DIAGNOSIS — E785 Hyperlipidemia, unspecified: Secondary | ICD-10-CM | POA: Diagnosis not present

## 2012-11-12 ENCOUNTER — Telehealth: Payer: Self-pay | Admitting: *Deleted

## 2012-11-12 DIAGNOSIS — C50419 Malignant neoplasm of upper-outer quadrant of unspecified female breast: Secondary | ICD-10-CM

## 2012-11-12 DIAGNOSIS — C50919 Malignant neoplasm of unspecified site of unspecified female breast: Secondary | ICD-10-CM

## 2012-11-12 MED ORDER — LETROZOLE 2.5 MG PO TABS: 2.5000 mg | ORAL_TABLET | Freq: Every day | ORAL | Status: DC

## 2012-11-12 MED ORDER — LORAZEPAM 0.5 MG PO TABS: 0.5000 mg | ORAL_TABLET | Freq: Every day | ORAL | Status: DC | PRN

## 2012-11-12 NOTE — Telephone Encounter (Signed)
Patient calling in to report that since starting on Arimidex on 9/5, she has had increased difficulty sleeping. Has been using her Ativan for sleep and will need refill. She also reports that she has severe bone/muscle pain, "aches like Neulasta shot". Want to see if Dr Darnelle Catalan can suggest an alternative med. Dr Darnelle Catalan said she can try Letrozole, but may have same effects. Will call in 30day supply. If the side effects are the same, she can switch back and complete 10years of Tamoxifen. Offered patient Gabapentin 300mg  for sleep, patient states she will continue to use Ativan.

## 2012-11-26 ENCOUNTER — Other Ambulatory Visit: Payer: Self-pay | Admitting: *Deleted

## 2012-11-26 MED ORDER — ANASTROZOLE 1 MG PO TABS: 1.0000 mg | ORAL_TABLET | Freq: Every day | ORAL | Status: DC

## 2012-12-01 ENCOUNTER — Other Ambulatory Visit (HOSPITAL_BASED_OUTPATIENT_CLINIC_OR_DEPARTMENT_OTHER): Payer: Medicare Other

## 2012-12-01 DIAGNOSIS — M899 Disorder of bone, unspecified: Secondary | ICD-10-CM

## 2012-12-01 DIAGNOSIS — E559 Vitamin D deficiency, unspecified: Secondary | ICD-10-CM

## 2012-12-01 DIAGNOSIS — C50411 Malignant neoplasm of upper-outer quadrant of right female breast: Secondary | ICD-10-CM

## 2012-12-01 DIAGNOSIS — C50419 Malignant neoplasm of upper-outer quadrant of unspecified female breast: Secondary | ICD-10-CM | POA: Diagnosis not present

## 2012-12-01 DIAGNOSIS — M8580 Other specified disorders of bone density and structure, unspecified site: Secondary | ICD-10-CM

## 2012-12-01 LAB — CBC WITH DIFFERENTIAL/PLATELET
BASO%: 0.8 % (ref 0.0–2.0)
LYMPH%: 39.6 % (ref 14.0–49.7)
MCHC: 33.7 g/dL (ref 31.5–36.0)
MONO#: 0.5 10*3/uL (ref 0.1–0.9)
NEUT#: 3 10*3/uL (ref 1.5–6.5)
RBC: 4.65 10*6/uL (ref 3.70–5.45)
RDW: 13.9 % (ref 11.2–14.5)
WBC: 6.3 10*3/uL (ref 3.9–10.3)
lymph#: 2.5 10*3/uL (ref 0.9–3.3)

## 2012-12-01 LAB — COMPREHENSIVE METABOLIC PANEL (CC13)
AST: 20 U/L (ref 5–34)
Albumin: 3.8 g/dL (ref 3.5–5.0)
Alkaline Phosphatase: 44 U/L (ref 40–150)
Chloride: 104 mEq/L (ref 98–109)
Glucose: 91 mg/dl (ref 70–140)
Potassium: 4.3 mEq/L (ref 3.5–5.1)
Sodium: 141 mEq/L (ref 136–145)
Total Protein: 6.7 g/dL (ref 6.4–8.3)

## 2012-12-08 ENCOUNTER — Encounter: Payer: Self-pay | Admitting: Physician Assistant

## 2012-12-08 ENCOUNTER — Ambulatory Visit (HOSPITAL_BASED_OUTPATIENT_CLINIC_OR_DEPARTMENT_OTHER): Payer: Medicare Other | Admitting: Physician Assistant

## 2012-12-08 VITALS — BP 153/80 | HR 72 | Temp 97.3°F | Resp 20 | Ht 61.0 in | Wt 128.5 lb

## 2012-12-08 DIAGNOSIS — F411 Generalized anxiety disorder: Secondary | ICD-10-CM | POA: Diagnosis not present

## 2012-12-08 DIAGNOSIS — C50411 Malignant neoplasm of upper-outer quadrant of right female breast: Secondary | ICD-10-CM

## 2012-12-08 DIAGNOSIS — Z853 Personal history of malignant neoplasm of breast: Secondary | ICD-10-CM

## 2012-12-08 DIAGNOSIS — C50919 Malignant neoplasm of unspecified site of unspecified female breast: Secondary | ICD-10-CM

## 2012-12-08 DIAGNOSIS — G4700 Insomnia, unspecified: Secondary | ICD-10-CM

## 2012-12-08 DIAGNOSIS — C50419 Malignant neoplasm of upper-outer quadrant of unspecified female breast: Secondary | ICD-10-CM | POA: Diagnosis not present

## 2012-12-08 DIAGNOSIS — M2550 Pain in unspecified joint: Secondary | ICD-10-CM

## 2012-12-08 DIAGNOSIS — Z170 Estrogen receptor positive status [ER+]: Secondary | ICD-10-CM | POA: Diagnosis not present

## 2012-12-08 DIAGNOSIS — F419 Anxiety disorder, unspecified: Secondary | ICD-10-CM

## 2012-12-08 MED ORDER — LETROZOLE 2.5 MG PO TABS: 2.5000 mg | ORAL_TABLET | Freq: Every day | ORAL | Status: DC

## 2012-12-08 NOTE — Progress Notes (Signed)
ID: Alice [name]   DOB: 07/17/1945  MR#: [id number]  CSN#:[id number]   PCP:  Alysia Penna, MD GYN: SU:  Glenna Fellows, MD;  OTHER:  Lurline Hare, MD; Stan Head, MD  CHIEF COMPLAINT:  Right Breast Cancer   HISTORY OF PRESENT ILLNESS: [name] noted a mass in her right breast late December of 2011.  She brought this to Dr. Rubye Oaks attention and he set her up for mammography at [facility] in [facility].  This showed a large spiculated mass in the posterior upper outer quadrant of the right breast which was palpable.  By ultrasound it measured 3.6 cm.  It was hypoechoic with internal blood flow.  Biopsy was suggested and performed January 17th, 2012.  The pathology from that biopsy ([id number]) showed an invasive ductal carcinoma, Grade 3, ER 95% and PR 47% positive with an MIB-1 of 50%, no HER2 amplification with a CISH ratio of 1.63.    With this information, the patient was referred to Dr. Donell Beers, and bilateral breast MRIs were obtained March 20, 2010.  This showed in the posterior 3rd of the outer right breast a 3.8 cm mass with a clip artifact and in addition in the axillary tail of the right breast, a 9 mm rounded mildly irregular mass, corresponding to an abnormal appearing lymph node seen at mammography.  There were no other areas suspicious for enhancement.     Her subsequent history is as summarized below  INTERVAL HISTORY: Alice [name] returns today  accompanied by her canine companion/therapy dog, Max, for followup of her right breast carcinoma. As a brief recap, Alice [name] was previously on tamoxifen, discontinued in approximately July or early August 2014. She took anastrozole briefly in early September, but felt she had poor tolerance due to bone and muscle pain. Subsequently, she discontinued the anastrozole in mid-September.   She was given a prescription for letrozole, buttermilk or pharmacy told her they could not feel it because they were "unable to get it from the  distributor". Accordingly, she has now been off of an aromatase inhibitor for approximately one month. Unfortunately, even off of the aromatase inhibitor, she is still having pain which she is now attributing more so to arthritis than the medication. Nonetheless, she would like to stay off of the anastrozole, and try a different aromatase inhibitor as previously discussed.     REVIEW OF SYSTEMS: Otherwise, Timmya denies any recent illnesses and has had no fevers or chills. She's had no rashes, no abnormal bruising, and no abnormal bleeding. Her energy level is fairly good. She's exercising more regularly now that she is walking her dog. She continues to have some problems with anxiety and associated insomnia for which she occasionally takes half a tablet of lorazepam. She's eating and drinking well, with no nausea, change in bowel or bladder habits, or unexplained weight loss. She's had no increased cough, phlegm production, shortness of breath, or chest pain. She's had no abnormal headaches, dizziness, or change in vision. She continues to have joint pain as noted above, but this primarily affects her at night. She's had no significant peripheral swelling.  A detailed review of systems is otherwise stable and noncontributory.   PAST MEDICAL HISTORY: Past Medical History  Diagnosis Date  . Hypothyroidism   . Breast cancer      Right , chemo, radiation  . Scoliosis     L4 L5  . History of diverticulitis of colon   . Alopecia   . HTN (hypertension)   .  Hypercholesteremia   . Seasonal allergies   . DDD (degenerative disc disease)   . SUI (stress urinary incontinence, female)   . Heart palpitations   1. Greater than 30 pack-year tobacco abuse, the patient quitting in the sixties.  2. History of sling procedure. 3. Seasonal allergies. 4. Status post hernia repair under Dr. Zachery Dakins. 5. Status post small bowel resection for diverticular disease in 2001.  6. Status post left cataract  surgery. 7. History of tonsillectomy. 8. History of palpitations.   9. History of degenerative disk disease. 10. Hypothyroidism. 11. Hypercholesterolemia. 12. Hypertension. Stress urinary incontinence  PAST SURGICAL HISTORY: Past Surgical History  Procedure Laterality Date  . Breast lumpectomy  2012    right  . Tonsillectomy  2001    Diverticulitis  . Hernia repair      RIH, Dr. Zachery Dakins  . Port-a-cath removal    . Cataract extraction      left  . Bladder suspension      FAMILY HISTORY The patient's father died from a stroke at the age of 71.  The patient's mother died with congestive heart failure at the age of 65.  The patient has one sister who died with non-Hodgkin's lymphoma at the age of 35.  She has a brother who was a Clinical cytogeneticist and died from unknown causes.    GYNECOLOGIC HISTORY: She is GX P3, first pregnancy to term at age 61.  She had menarche at age 89.  She went through menopause in her fifties. She took hormone replacement until the time of her breast cancer diagnosis  SOCIAL HISTORY:  (Updated 12/08/2012) She worked as a Emergency planning/management officer. She is divorced and lives by herself with her canine companion, Max, who is a [place] terrier and works as a Garment/textile technologist. Son Candie Echevaria lives in [place] where he works in Consulting civil engineer.  He is the patient's healthcare power of attorney and may be reached at [phone].  The patient's son Tawanna Cooler also works in IT in the [place] area.  Son L. Dionicio Stall is a Advice worker [employer].  The patient has 5 grandchildren and 2 great-grandchildren. She attends [employer].      ADVANCED DIRECTIVES: In place  HEALTH MAINTENANCE: (Updated 12/08/2012) History  Substance Use Topics  . Smoking status: Former Smoker    Quit date: 08/09/1964  . Smokeless tobacco: Never Used  . Alcohol Use: Yes     Comment: occasional    Colonoscopy: never  PAP: 2001  Bone density: July 2014, normal  Lipid  panel: Followed by Dr. Alysia Penna   Allergies  Allergen Reactions  . Amoxicillin Rash  . Diphenhydramine Rash    "It is the red dye in Benadryl that I''m allergic to"  . Naproxen Sodium Rash    "It is the blue dye in naproxen that I'm allergic to."  . Lipitor [Atorvastatin]     Current Outpatient Prescriptions  Medication Sig Dispense Refill  . aspirin 81 MG tablet Take 81 mg by mouth daily.      . fish oil-omega-3 fatty acids 1000 MG capsule Take 1 g by mouth daily.       Marland Kitchen letrozole (FEMARA) 2.5 MG tablet Take 1 tablet (2.5 mg total) by mouth daily.  30 tablet  1  . levothyroxine (SYNTHROID) 100 MCG tablet Take 100 mcg by mouth daily.        Marland Kitchen LORazepam (ATIVAN) 0.5 MG tablet Take 1 tablet (0.5 mg total) by mouth daily as needed  for anxiety.  90 tablet  0  . Multiple Vitamin (MULTIVITAMIN PO) Take by mouth daily.        . Naproxen Sodium (ALEVE PO) Take by mouth as needed.         No current facility-administered medications for this visit.    OBJECTIVE: Middle-aged white woman in no acute distress. Filed Vitals:   12/08/12 1043  BP: 153/80  Pulse: 72  Temp: 97.3 F (36.3 C)  Resp: 20     Body mass index is 24.29 kg/(m^2).    ECOG FS: 0 Filed Weights   12/08/12 1043  Weight: 128 lb 8 oz (58.287 kg)   Physical Exam: HEENT:  Sclerae anicteric.  Oropharynx clear.  NODES:  No cervical or supraclavicular lymphadenopathy palpated.  BREAST EXAM:  Right breast is status post lumpectomy, no suspicious nodularities or skin changes, no evidence of local recurrence. Left breast is unremarkable. Axillae are benign, no palpable adenopathy. LUNGS:  Clear to auscultation bilaterally.  No wheezes or rhonchi HEART:  Regular rate and rhythm. No murmur  ABDOMEN:  Soft, nontender.  Positive bowel sounds.  MSK:  No focal spinal tenderness to palpation. Full range of motion in the upper extremities. EXTREMITIES:  No peripheral edema.   NEURO:  Nonfocal. Well oriented.  Positive  affect.   LAB RESULTS: Lab Results  Component Value Date   WBC 6.3 12/01/2012   NEUTROABS 3.0 12/01/2012   HGB 14.4 12/01/2012   HCT 42.7 12/01/2012   MCV 91.9 12/01/2012   PLT 206 12/01/2012      Chemistry      Component Value Date/Time   NA 141 12/01/2012 1044   NA 138 08/10/2011 1405   K 4.3 12/01/2012 1044   K 3.7 08/10/2011 1405   CL 104 08/04/2012 1350   CL 99 08/10/2011 1405   CO2 29 12/01/2012 1044   CO2 29 08/10/2011 1405   BUN 22.4 12/01/2012 1044   BUN 21 08/10/2011 1405   CREATININE 0.7 12/01/2012 1044   CREATININE 0.67 08/10/2011 1405      Component Value Date/Time   CALCIUM 9.8 12/01/2012 1044   CALCIUM 9.8 08/10/2011 1405   ALKPHOS 44 12/01/2012 1044   ALKPHOS 34* 08/10/2011 1054   AST 20 12/01/2012 1044   AST 17 08/10/2011 1054   ALT 21 12/01/2012 1044   ALT 14 08/10/2011 1054   BILITOT 0.51 12/01/2012 1044   BILITOT 0.6 08/10/2011 1054      STUDIES:  Most recent bilateral mammogram on 03/31/2012 was unremarkable.  Most recent bone density on 08/27/2012 was normal.    ASSESSMENT: 79 y.o. [place] woman,   (1)  status post right breast biopsy March 17, 2010 for a grade 3 invasive ductal carcinoma measuring 3.8 cm by MRI with a suspicious right axillary lymph node which was, however, negative by biopsy, the tumor being estrogen and progesterone receptor positive, HER2-negative, with an MIB-1 of 50%.    (2)  Neoadjuvantly, she was treated with dose-dense doxorubicin and cyclophosphamide x4 followed by weekly paclitaxel x 12, completed July 2012 followed by definitive right lumpectomy and sentinel lymph node sampling August 2012 showing a complete pathologic response.   (3)  She completed radiation treatments November 2012 and started tamoxifen at that time.  (4)  discontinued tamoxifen in approximately July of 2014, and was switched to anastrozole. Anastrozole for approximately 2 weeks between 10/31/2012 and 11/12/2012, been discontinued secondary to bone and muscle  pain.  (5)  Now ready to initiate letrozole, 2.5 mg  daily.      PLAN:  Loye appears to be doing well with regards to her breast cancer, with no clinical evidence of disease recurrence at this time. Although neither of [place] are convinced that the anastrozole was the culprit for her increased joint pain, she would still like to stay off the anastrozole and try letrozole instead. She would like a new prescription for the letrozole, and will try to have it filled locally, pending further information from her mail order pharmacy about their "shortage" of the drug.She'll continue to use lorazepam sparingly for anxiety and insomnia.  Amarii will also continue to be followed closely by her primary care physician, Dr. Alysia Penna.  She's going to keep track of her blood pressure at home to review with him at their next visit, trying to decide whether her blood pressure is truly elevated or if she is having "whitecoat syndrome" at her doctor's visits.   She is due for her next mammogram in early February, and will return to see Dr. Darnelle Catalan for labs and physical exam soon thereafter. She voices understanding and agreement with this plan, and will call with any changes or problems prior to her next appointment.   Liliann File    12/08/2012

## 2012-12-10 ENCOUNTER — Telehealth: Payer: Self-pay | Admitting: Oncology

## 2012-12-15 ENCOUNTER — Telehealth: Payer: Self-pay | Admitting: *Deleted

## 2012-12-15 DIAGNOSIS — I739 Peripheral vascular disease, unspecified: Secondary | ICD-10-CM | POA: Diagnosis not present

## 2012-12-15 DIAGNOSIS — L608 Other nail disorders: Secondary | ICD-10-CM | POA: Diagnosis not present

## 2012-12-15 NOTE — Telephone Encounter (Signed)
Pt called this RN on 10/17 stating she took one dose of letrozole and " felt dizzy, woozy running in to walls so I have taken any more of it."  This note will be given to MD for review for appropriate follow up .

## 2012-12-24 DIAGNOSIS — M2550 Pain in unspecified joint: Secondary | ICD-10-CM | POA: Diagnosis not present

## 2012-12-24 DIAGNOSIS — M25559 Pain in unspecified hip: Secondary | ICD-10-CM | POA: Diagnosis not present

## 2012-12-24 DIAGNOSIS — E559 Vitamin D deficiency, unspecified: Secondary | ICD-10-CM | POA: Diagnosis not present

## 2012-12-24 DIAGNOSIS — M25519 Pain in unspecified shoulder: Secondary | ICD-10-CM | POA: Diagnosis not present

## 2012-12-24 DIAGNOSIS — M545 Low back pain: Secondary | ICD-10-CM | POA: Diagnosis not present

## 2013-01-02 ENCOUNTER — Telehealth: Payer: Self-pay | Admitting: *Deleted

## 2013-01-02 NOTE — Telephone Encounter (Signed)
This RN left message for return call per pt's message stating she was waiting to hear about plan post not able to tolerate letrozole.

## 2013-01-05 DIAGNOSIS — M76899 Other specified enthesopathies of unspecified lower limb, excluding foot: Secondary | ICD-10-CM | POA: Diagnosis not present

## 2013-01-06 ENCOUNTER — Telehealth: Payer: Self-pay | Admitting: *Deleted

## 2013-01-06 NOTE — Telephone Encounter (Signed)
This RN received returned call from pt stating she just returned from out of town and noted request for a return call.  Pt requested if no answer to leave message on " messaging center".  This RN returned call and obtained identified VM. Message left informing pt of MD recommendation to re attempt taking letrozole.

## 2013-01-07 ENCOUNTER — Encounter: Payer: Self-pay | Admitting: *Deleted

## 2013-01-07 ENCOUNTER — Ambulatory Visit (HOSPITAL_BASED_OUTPATIENT_CLINIC_OR_DEPARTMENT_OTHER): Payer: Medicare Other | Admitting: Oncology

## 2013-01-07 ENCOUNTER — Encounter (INDEPENDENT_AMBULATORY_CARE_PROVIDER_SITE_OTHER): Payer: Self-pay

## 2013-01-07 VITALS — BP 154/80 | HR 76 | Temp 97.8°F | Resp 18

## 2013-01-07 DIAGNOSIS — Z170 Estrogen receptor positive status [ER+]: Secondary | ICD-10-CM | POA: Diagnosis not present

## 2013-01-07 DIAGNOSIS — C50419 Malignant neoplasm of upper-outer quadrant of unspecified female breast: Secondary | ICD-10-CM | POA: Diagnosis not present

## 2013-01-07 DIAGNOSIS — C50411 Malignant neoplasm of upper-outer quadrant of right female breast: Secondary | ICD-10-CM

## 2013-01-07 MED ORDER — TAMOXIFEN CITRATE 20 MG PO TABS: 20.0000 mg | ORAL_TABLET | Freq: Every day | ORAL | Status: DC

## 2013-01-07 NOTE — Telephone Encounter (Signed)
appts made and printed...td 

## 2013-01-07 NOTE — Progress Notes (Signed)
ID: Alice [name]   DOB: [birth year]  MR#: [id number]  QMV#:[id number]   PCP:  Alice Penna, MD GYN: SU:  Alice Fellows, MD;  OTHER:  Alice Hare, MD; Alice Head, MD  CHIEF COMPLAINT:  Right Breast Cancer   HISTORY OF PRESENT ILLNESS: [name] noted a mass in her right breast late December of 2011.  She brought this to Dr. Rubye [name] attention and he set her up for mammography at [facility] in [facility].  This showed a large spiculated mass in the posterior upper outer quadrant of the right breast which was palpable.  By ultrasound it measured 3.6 cm.  It was hypoechoic with internal blood flow.  Biopsy was suggested and performed January 17th, 2012.  The pathology from that biopsy ([id number]) showed an invasive ductal carcinoma, Grade 3, ER 95% and PR 47% positive with an MIB-1 of 50%, no HER2 amplification with a CISH ratio of 1.63.    With this information, the patient was referred to Dr. Donell [name], and bilateral breast MRIs were obtained March 20, 2010.  This showed in the posterior 3rd of the outer right breast a 3.8 cm mass with a clip artifact and in addition in the axillary tail of the right breast, a 9 mm rounded mildly irregular mass, corresponding to an abnormal appearing lymph node seen at mammography.  There were no other areas suspicious for enhancement.     Her subsequent history is as summarized below  INTERVAL HISTORY: Alice [name] returns today  accompanied by her canine companion/therapy dog, Alice [name]. After her October visit here she started letrozole. It was fairly disastrous. She developed aches, pains, nausea and vomiting, and severe headaches. She stopped after 2 weeks. She is here today to discuss alternatives.    REVIEW OF SYSTEMS: Aside from the problems related to letrozole, she has had a little bit of hair loss which concerns her. Currently she is having no headaches, visual changes, nausea, vomiting, cough, phlegm production, pleurisy, shortness of breath,  chest pain or pressure, change in bowel or bladder habits, rash, fever, or bleeding. A detailed review of systems was otherwise noncontributory  PAST MEDICAL HISTORY: Past Medical History  Diagnosis Date  . Hypothyroidism   . Breast cancer      Right , chemo, radiation  . Scoliosis     L4 L5  . History of diverticulitis of colon   . Alopecia   . HTN (hypertension)   . Hypercholesteremia   . Seasonal allergies   . DDD (degenerative disc disease)   . SUI (stress urinary incontinence, female)   . Heart palpitations   1. Greater than 30 pack-year tobacco abuse, the patient quitting in the sixties.  2. History of sling procedure. 3. Seasonal allergies. 4. Status post hernia repair under Dr. Zachery [name]. 5. Status post small bowel resection for diverticular disease in 2001.  6. Status post left cataract surgery. 7. History of tonsillectomy. 8. History of palpitations.   9. History of degenerative disk disease. 10. Hypothyroidism. 11. Hypercholesterolemia. 12. Hypertension. Stress urinary incontinence  PAST SURGICAL HISTORY: Past Surgical History  Procedure Laterality Date  . Breast lumpectomy  2012    right  . Tonsillectomy  2001    Diverticulitis  . Hernia repair      RIH, Dr. Zachery [name]  . Port-a-cath removal    . Cataract extraction      left  . Bladder suspension      FAMILY HISTORY The patient's father died from a stroke at the age of 30.  The patient's mother died with congestive heart failure at the age of 25.  The patient has one sister who died with non-Hodgkin's lymphoma at the age of 44.  She has a brother who was a Clinical cytogeneticist and died from unknown causes.    GYNECOLOGIC HISTORY: She is GX P3, first pregnancy to term at age 54.  She had menarche at age 67.  She went through menopause in her fifties. She took hormone replacement until the time of her breast cancer diagnosis  SOCIAL HISTORY:  (Updated 12/08/2012) She worked as a Emergency planning/management officer.  She is divorced and lives by herself with her canine companion, Alice [name], who is a Alice [name] terrier and works as a Garment/textile technologist. Son Alice [name] lives in [place] where he works in Consulting civil engineer.  He is the patient's healthcare power of attorney and may be reached at [phone].  The patient's son Alice [name] also works in IT in the [place] area.  Son Alice [name] is [name] of [employer].  The patient has 5 grandchildren and 2 great-grandchildren. She attends [employer].      ADVANCED DIRECTIVES: In place  HEALTH MAINTENANCE: (Updated 12/08/2012) History  Substance Use Topics  . Smoking status: Former Smoker    Quit date: 08/09/1964  . Smokeless tobacco: Never Used  . Alcohol Use: Yes     Comment: occasional    Colonoscopy: never  PAP: 2001  Bone density: July 2014, normal  Lipid panel: Followed by Dr. Alysia [name]   Allergies  Allergen Reactions  . Amoxicillin Rash  . Diphenhydramine Rash    "It is the red dye in Benadryl that I''m allergic to"  . Naproxen Sodium Rash    "It is the blue dye in naproxen that I'm allergic to."  . Lipitor [Atorvastatin]     Current Outpatient Prescriptions  Medication Sig Dispense Refill  . aspirin 81 MG tablet Take 81 mg by mouth daily.      . fish oil-omega-3 fatty acids 1000 MG capsule Take 1 g by mouth daily.       Alice [name] letrozole (FEMARA) 2.5 MG tablet Take 1 tablet (2.5 mg total) by mouth daily.  30 tablet  1  . levothyroxine (SYNTHROID) 100 MCG tablet Take 100 mcg by mouth daily.        Alice [name] LORazepam (ATIVAN) 0.5 MG tablet Take 1 tablet (0.5 mg total) by mouth daily as needed for anxiety.  90 tablet  0  . Multiple Vitamin (MULTIVITAMIN PO) Take by mouth daily.        . Naproxen Sodium (ALEVE PO) Take by mouth as needed.         No current facility-administered medications for this visit.    OBJECTIVE: Middle-aged white woman who looks well Filed Vitals:   01/07/13 1142  BP: 154/80  Pulse: 76  Temp: 97.8 F (36.6 C)   Resp: 18     Body mass index is 0.00 kg/(m^2).    ECOG FS: 0 Filed Weights   Sclerae unicteric, pupils equal round and reactive to light Oropharynx clear, good dentition No cervical or supraclavicular adenopathy Lungs no rales or rhonchi Heart regular rate and rhythm Abd soft, nontender, positive bowel sounds MSK no focal spinal tenderness, no peripheral edema Neuro: nonfocal, well oriented, positive affect Breasts: The right breast is status post lumpectomy and radiation. There is no evidence of local recurrence. The right axilla is benign The left breast is unremarkable.    LAB RESULTS: Lab Results  Component  Value Date   WBC 6.3 12/01/2012   NEUTROABS 3.0 12/01/2012   HGB 14.4 12/01/2012   HCT 42.7 12/01/2012   MCV 91.9 12/01/2012   PLT 206 12/01/2012      Chemistry      Component Value Date/Time   NA 141 12/01/2012 1044   NA 138 08/10/2011 1405   K 4.3 12/01/2012 1044   K 3.7 08/10/2011 1405   CL 104 08/04/2012 1350   CL 99 08/10/2011 1405   CO2 29 12/01/2012 1044   CO2 29 08/10/2011 1405   BUN 22.4 12/01/2012 1044   BUN 21 08/10/2011 1405   CREATININE 0.7 12/01/2012 1044   CREATININE 0.67 08/10/2011 1405      Component Value Date/Time   CALCIUM 9.8 12/01/2012 1044   CALCIUM 9.8 08/10/2011 1405   ALKPHOS 44 12/01/2012 1044   ALKPHOS 34* 08/10/2011 1054   AST 20 12/01/2012 1044   AST 17 08/10/2011 1054   ALT 21 12/01/2012 1044   ALT 14 08/10/2011 1054   BILITOT 0.51 12/01/2012 1044   BILITOT 0.6 08/10/2011 1054      STUDIES:  Most recent bilateral mammogram on 03/31/2012 was unremarkable.  Most recent bone density on 08/27/2012 was normal.    ASSESSMENT: 79 y.o. [place] woman,   (1)  status post right breast biopsy March 17, 2010 for a grade 3 invasive ductal carcinoma measuring 3.8 cm by MRI with a suspicious right axillary lymph node which was, however, negative by biopsy, the tumor being estrogen and progesterone receptor positive, HER2-negative, with an MIB-1 of  50%.    (2)  Neoadjuvantly, she was treated with dose-dense doxorubicin and cyclophosphamide x4 followed by weekly paclitaxel x 12, completed July 2012 followed by definitive right lumpectomy and sentinel lymph node sampling August 2012 showing a complete pathologic response.   (3)  She completed radiation treatments November 2012 and started tamoxifen at that time.  (4)  discontinued tamoxifen in approximately July of 2014, and was switched to anastrozole. Anastrozole for approximately 2 weeks between 10/31/2012 and 11/12/2012,  discontinued secondary to bone and muscle pain.  (5)  started letrozole October 2014, discontinued because of headaches, nausea, vomiting, and aches and pains.  (6) resumed tamoxifen in November 2014    PLAN:  Gabbie simply cannot tolerate aromatase inhibitors. Of course we could try exemestane. Instead, we are going back to tamoxifen, which she did well with, and the plan will be to do that for 10 years.  She has had a little bit of hair loss as her main side effect from the anti-estrogens. That certainly is a recognized side effect. Hopefully it will not get any worse. Right now it is not appreciable.  She is going to return to see me again in April of 2015, after her next mammogram. If all is going well then, we will start seeing her on a once a year basis.  She is interested in getting Alice [name] to be a "therapy dog". I have given her the information on [place] [facility] so she can get a Geneticist, molecular and allow ability insurance paid for.   She knows to call for any problems that may develop before her next visit here.   MAGRINAT,GUSTAV C    01/07/2013

## 2013-01-07 NOTE — Progress Notes (Signed)
Med list update

## 2013-03-16 DIAGNOSIS — M76899 Other specified enthesopathies of unspecified lower limb, excluding foot: Secondary | ICD-10-CM | POA: Diagnosis not present

## 2013-03-19 DIAGNOSIS — R809 Proteinuria, unspecified: Secondary | ICD-10-CM | POA: Diagnosis not present

## 2013-03-19 DIAGNOSIS — R300 Dysuria: Secondary | ICD-10-CM | POA: Diagnosis not present

## 2013-03-26 DIAGNOSIS — I739 Peripheral vascular disease, unspecified: Secondary | ICD-10-CM | POA: Diagnosis not present

## 2013-03-26 DIAGNOSIS — L608 Other nail disorders: Secondary | ICD-10-CM | POA: Diagnosis not present

## 2013-03-30 ENCOUNTER — Telehealth: Payer: Self-pay | Admitting: Oncology

## 2013-03-30 NOTE — Telephone Encounter (Signed)
pt called to r/s appt...done....pt ok and aware °

## 2013-04-01 ENCOUNTER — Ambulatory Visit
Admission: RE | Admit: 2013-04-01 | Discharge: 2013-04-01 | Disposition: A | Discharge status: Home / Self Care | Payer: Medicare Other | Source: Ambulatory Visit | Attending: Physician Assistant | Admitting: Physician Assistant

## 2013-04-01 ENCOUNTER — Other Ambulatory Visit: Payer: Medicare Other

## 2013-04-01 DIAGNOSIS — R928 Other abnormal and inconclusive findings on diagnostic imaging of breast: Secondary | ICD-10-CM | POA: Diagnosis not present

## 2013-04-01 DIAGNOSIS — Z853 Personal history of malignant neoplasm of breast: Secondary | ICD-10-CM

## 2013-04-07 ENCOUNTER — Ambulatory Visit: Payer: Medicare Other | Admitting: Oncology

## 2013-04-28 DIAGNOSIS — I10 Essential (primary) hypertension: Secondary | ICD-10-CM | POA: Diagnosis not present

## 2013-04-28 DIAGNOSIS — E785 Hyperlipidemia, unspecified: Secondary | ICD-10-CM | POA: Diagnosis not present

## 2013-04-28 DIAGNOSIS — E039 Hypothyroidism, unspecified: Secondary | ICD-10-CM | POA: Diagnosis not present

## 2013-04-29 ENCOUNTER — Telehealth: Payer: Self-pay | Admitting: Oncology

## 2013-04-29 ENCOUNTER — Ambulatory Visit (HOSPITAL_BASED_OUTPATIENT_CLINIC_OR_DEPARTMENT_OTHER): Payer: Medicare Other | Admitting: Oncology

## 2013-04-29 ENCOUNTER — Other Ambulatory Visit (HOSPITAL_BASED_OUTPATIENT_CLINIC_OR_DEPARTMENT_OTHER): Payer: Medicare Other

## 2013-04-29 VITALS — BP 120/75 | HR 69 | Temp 97.6°F | Resp 18 | Ht 61.0 in | Wt 131.3 lb

## 2013-04-29 DIAGNOSIS — M2550 Pain in unspecified joint: Secondary | ICD-10-CM

## 2013-04-29 DIAGNOSIS — W5501XA Bitten by cat, initial encounter: Secondary | ICD-10-CM

## 2013-04-29 DIAGNOSIS — C50411 Malignant neoplasm of upper-outer quadrant of right female breast: Secondary | ICD-10-CM

## 2013-04-29 DIAGNOSIS — Z170 Estrogen receptor positive status [ER+]: Secondary | ICD-10-CM

## 2013-04-29 DIAGNOSIS — C50419 Malignant neoplasm of upper-outer quadrant of unspecified female breast: Secondary | ICD-10-CM | POA: Diagnosis not present

## 2013-04-29 DIAGNOSIS — S61259A Open bite of unspecified finger without damage to nail, initial encounter: Secondary | ICD-10-CM

## 2013-04-29 DIAGNOSIS — Z853 Personal history of malignant neoplasm of breast: Secondary | ICD-10-CM

## 2013-04-29 DIAGNOSIS — F419 Anxiety disorder, unspecified: Secondary | ICD-10-CM

## 2013-04-29 DIAGNOSIS — L0390 Cellulitis, unspecified: Secondary | ICD-10-CM

## 2013-04-29 LAB — CBC WITH DIFFERENTIAL/PLATELET
BASO%: 0.7 % (ref 0.0–2.0)
BASOS ABS: 0 10*3/uL (ref 0.0–0.1)
EOS ABS: 0.2 10*3/uL (ref 0.0–0.5)
EOS%: 2.2 % (ref 0.0–7.0)
HEMATOCRIT: 41.3 % (ref 34.8–46.6)
HEMOGLOBIN: 13.4 g/dL (ref 11.6–15.9)
LYMPH#: 2.6 10*3/uL (ref 0.9–3.3)
LYMPH%: 34.8 % (ref 14.0–49.7)
MCH: 30.6 pg (ref 25.1–34.0)
MCHC: 32.5 g/dL (ref 31.5–36.0)
MCV: 94.1 fL (ref 79.5–101.0)
MONO#: 0.4 10*3/uL (ref 0.1–0.9)
MONO%: 5.7 % (ref 0.0–14.0)
NEUT%: 56.6 % (ref 38.4–76.8)
NEUTROS ABS: 4.2 10*3/uL (ref 1.5–6.5)
PLATELETS: 175 10*3/uL (ref 145–400)
RBC: 4.38 10*6/uL (ref 3.70–5.45)
RDW: 13.8 % (ref 11.2–14.5)
WBC: 7.3 10*3/uL (ref 3.9–10.3)

## 2013-04-29 LAB — COMPREHENSIVE METABOLIC PANEL (CC13)
ALT: 14 U/L (ref 0–55)
ANION GAP: 9 meq/L (ref 3–11)
AST: 21 U/L (ref 5–34)
Albumin: 3.7 g/dL (ref 3.5–5.0)
Alkaline Phosphatase: 36 U/L — ABNORMAL LOW (ref 40–150)
BILIRUBIN TOTAL: 0.31 mg/dL (ref 0.20–1.20)
BUN: 21.2 mg/dL (ref 7.0–26.0)
CALCIUM: 9.8 mg/dL (ref 8.4–10.4)
CHLORIDE: 105 meq/L (ref 98–109)
CO2: 30 meq/L — AB (ref 22–29)
CREATININE: 0.8 mg/dL (ref 0.6–1.1)
GLUCOSE: 95 mg/dL (ref 70–140)
Potassium: 4 mEq/L (ref 3.5–5.1)
Sodium: 144 mEq/L (ref 136–145)
Total Protein: 6.3 g/dL — ABNORMAL LOW (ref 6.4–8.3)

## 2013-04-29 NOTE — Progress Notes (Signed)
ID: Alice [name]   DOB: 06-29-1930  MR#: [id number]  MBP#:[id number]   PCP:  Alice Hatchet, MD GYN: SU:  Alice Seltzer, MD;  OTHER:  Alice Silversmith, MD; Alice Rusk, MD  CHIEF COMPLAINT:  Right Breast Cancer   HISTORY OF PRESENT ILLNESS: Alice [name] noted a mass in her right breast late December of 2011.  She brought this to Alice [name] attention and he set her up for mammography at [place] in [facility].  This showed a large spiculated mass in the posterior upper outer quadrant of the right breast which was palpable.  By ultrasound it measured 3.6 cm.  It was hypoechoic with internal blood flow.  Biopsy was suggested and performed January 17th, 2012.  The pathology from that biopsy ([id number]) showed an invasive ductal carcinoma, Grade 3, ER 95% and PR 47% positive with an MIB-1 of 50%, no HER2 amplification with a CISH ratio of 1.63.    With this information, the patient was referred to Alice [name], and bilateral breast MRIs were obtained March 20, 2010.  This showed in the posterior 3rd of the outer right breast a 3.8 cm mass with a clip artifact and in addition in the axillary tail of the right breast, a 9 mm rounded mildly irregular mass, corresponding to an abnormal appearing lymph node seen at mammography.  There were no other areas suspicious for enhancement.     Her subsequent history is as summarized below  INTERVAL HISTORY: Alice [name] returns today  accompanied by her canine companion/therapy dog, Alice [name]. She went back on tamoxifen 3 months ago. Initially she had a rash, stopped it for about a week, and then started taking it in the morning instead of evening. She hasn't had any further problems.  REVIEW OF SYSTEMS: In particular hot flashes are not a major concern. They do occur but they are minor. She has no problems with vaginal discharge. She has a little bit of lower abdominal discomfort today, but this is not something that recurs. She just came back from a trip to  [place] and 8 a lot of fruit. Possibly that is related. Otherwise a detailed review of systems today was entirely noncontributory  PAST MEDICAL HISTORY: Past Medical History  Diagnosis Date  . Hypothyroidism   . Breast cancer      Right , chemo, radiation  . Scoliosis     L4 L5  . History of diverticulitis of colon   . Alopecia   . HTN (hypertension)   . Hypercholesteremia   . Seasonal allergies   . DDD (degenerative disc disease)   . SUI (stress urinary incontinence, female)   . Heart palpitations   1. Greater than 30 pack-year tobacco abuse, the patient quitting in the sixties.  2. History of sling procedure. 3. Seasonal allergies. 4. Status post hernia repair under Dr. Rise Patience. 5. Status post small bowel resection for diverticular disease in 2001.  6. Status post left cataract surgery. 7. History of tonsillectomy. 8. History of palpitations.   9. History of degenerative disk disease. 10. Hypothyroidism. 11. Hypercholesterolemia. 12. Hypertension. Stress urinary incontinence  PAST SURGICAL HISTORY: Past Surgical History  Procedure Laterality Date  . Breast lumpectomy  2012    right  . Tonsillectomy  2001    Diverticulitis  . Hernia repair      RIH, Dr. Rise Patience  . Port-a-cath removal    . Cataract extraction      left  . Bladder suspension      FAMILY HISTORY The  patient's father died from a stroke at the age of 28.  The patient's mother died with congestive heart failure at the age of 40.  The patient has one sister who died with non-Hodgkin's lymphoma at the age of 52.  She has a brother who was a Manufacturing engineer and died from unknown causes.    GYNECOLOGIC HISTORY: She is GX P3, first pregnancy to term at age 24.  She had menarche at age 13.  She went through menopause in her fifties. She took hormone replacement until the time of her breast cancer diagnosis  SOCIAL HISTORY:  (Updated 12/08/2012) She worked as a Designer, jewellery. She is  divorced and lives by herself with her canine companion, Alice [name], who is a [name] terrier and works as a Chief Technology Officer. Son Alice [name] lives in [place] where he works in Engineer, technical sales.  He is the patient's healthcare power of attorney and may be reached at [phone].  The patient's son Alice [name] also works in IT in the [place] area.  Son Alice [name] is head of [employer].  The patient has 5 grandchildren and 2 great-grandchildren. She attends [employer].      ADVANCED DIRECTIVES: In place  HEALTH MAINTENANCE: (Updated 12/08/2012) History  Substance Use Topics  . Smoking status: Former Smoker    Quit date: 08/09/1964  . Smokeless tobacco: Never Used  . Alcohol Use: Yes     Comment: occasional    Colonoscopy: never  PAP: 2001  Bone density: July 2014, normal  Lipid panel: Followed by Dr. Velna [name]   Allergies  Allergen Reactions  . Amoxicillin Rash  . Diphenhydramine Rash    "It is the red dye in Benadryl that I''m allergic to"  . Naproxen Sodium Rash    "It is the blue dye in naproxen that I'm allergic to."  . Lipitor [Atorvastatin]   . Hctz [Hydrochlorothiazide] Itching    Current Outpatient Prescriptions  Medication Sig Dispense Refill  . aspirin 81 MG tablet Take 81 mg by mouth daily.      . fish oil-omega-3 fatty acids 1000 MG capsule Take 2 g by mouth daily.       Marland Kitchen levothyroxine (SYNTHROID) 100 MCG tablet Take 100 mcg by mouth daily.        Marland Kitchen LORazepam (ATIVAN) 0.5 MG tablet Take 1 tablet (0.5 mg total) by mouth daily as needed for anxiety.  90 tablet  0  . Multiple Vitamin (MULTIVITAMIN PO) Take by mouth daily.        . Naproxen Sodium (ALEVE PO) Take by mouth as needed.        . tamoxifen (NOLVADEX) 20 MG tablet Take 1 tablet (20 mg total) by mouth daily.  90 tablet  12   No current facility-administered medications for this visit.    OBJECTIVE: Middle-aged white woman in no acute distress Filed Vitals:   04/29/13 1419  BP: 120/75   Pulse: 69  Temp: 97.6 F (36.4 C)  Resp: 18     Body mass index is 24.82 kg/(m^2).    ECOG FS: 0 Filed Weights   04/29/13 1419  Weight: 131 lb 4.8 oz (59.557 kg)   Sclerae unicteric, pupils round and equal Oropharynx clear, good dentition No cervical or supraclavicular adenopathy Lungs no rales or rhonchi Heart regular rate and rhythm Abd soft, nontender, positive bowel sounds MSK no focal spinal tenderness, no peripheral edema Neuro: nonfocal, well oriented, positive affect Breasts: The right breast is status post lumpectomy and  radiation. There is no evidence of local recurrence. The right axilla is benign The left breast is unremarkable.    LAB RESULTS: Lab Results  Component Value Date   WBC 7.3 04/29/2013   NEUTROABS 4.2 04/29/2013   HGB 13.4 04/29/2013   HCT 41.3 04/29/2013   MCV 94.1 04/29/2013   PLT 175 04/29/2013      Chemistry      Component Value Date/Time   NA 141 12/01/2012 1044   NA 138 08/10/2011 1405   K 4.3 12/01/2012 1044   K 3.7 08/10/2011 1405   CL 104 08/04/2012 1350   CL 99 08/10/2011 1405   CO2 29 12/01/2012 1044   CO2 29 08/10/2011 1405   BUN 22.4 12/01/2012 1044   BUN 21 08/10/2011 1405   CREATININE 0.7 12/01/2012 1044   CREATININE 0.67 08/10/2011 1405      Component Value Date/Time   CALCIUM 9.8 12/01/2012 1044   CALCIUM 9.8 08/10/2011 1405   ALKPHOS 44 12/01/2012 1044   ALKPHOS 34* 08/10/2011 1054   AST 20 12/01/2012 1044   AST 17 08/10/2011 1054   ALT 21 12/01/2012 1044   ALT 14 08/10/2011 1054   BILITOT 0.51 12/01/2012 1044   BILITOT 0.6 08/10/2011 1054      STUDIES: DIGITAL DIAGNOSTIC bilateral MAMMOGRAM WITH CAD  COMPARISON: 03/31/2012 and earlier  ACR Breast Density Category b: There are scattered areas of  fibroglandular density.  FINDINGS:  No suspicious mass, distortion, or microcalcifications are  identified to suggest presence of malignancy. Postoperative changes  are identified in the lateral aspect of the right breast.  Mammographic images  were processed with CAD.  IMPRESSION:  No mammographic evidence for malignancy.  RECOMMENDATION:  Diagnostic mammogram is suggested in 1 year. (Code:DM-B-01Y)  I have discussed the findings and recommendations with the patient.  Results were also provided in writing at the conclusion of the  visit. If applicable, a reminder letter will be sent to the patient  regarding the next appointment.  BI-RADS CATEGORY 2: Benign finding(s).  Electronically Signed  By: Shon Hale M.D.  On: 04/01/2013 17:15   Bone density on 08/27/2012 was normal.    ASSESSMENT: 80 y.o. [place] woman,   (1)  status post right breast biopsy March 17, 2010 for a grade 3 invasive ductal carcinoma measuring 3.8 cm by MRI with a suspicious right axillary lymph node which was, however, negative by biopsy, the tumor being estrogen and progesterone receptor positive, HER2-negative, with an MIB-1 of 50%.    (2)  Neoadjuvantly, she was treated with dose-dense doxorubicin and cyclophosphamide x4 followed by weekly paclitaxel x 12, completed July 2012 followed by definitive right lumpectomy and sentinel lymph node sampling August 2012 showing a complete pathologic response.   (3)  She completed radiation treatments November 2012 and started tamoxifen at that time.  (4)  discontinued tamoxifen in approximately July of 2014, and was switched to anastrozole. Anastrozole for approximately 2 weeks between 10/31/2012 and 11/12/2012,  discontinued secondary to bone and muscle [name].  (5)  started letrozole October 2014, discontinued because of headaches, nausea, vomiting, and aches and pains.  (6) resumed tamoxifen in November 2014    PLAN:  [place] is tolerating the tamoxifen without significant side effects and the plan is going to be to continue that at least 5 years.  She is finally "bored", which is an excellent prognostic marker, is getting involved in a variety of activities, and plans to train Alice [name] to be a "therapy  dog". I gave  her the information on how to get herself trained to train him.  She knows to call for any problems that may develop before the next visit here, which will be on September [birth year] (Alice [name]'s birthday).  MAGRINAT,GUSTAV C    04/29/2013

## 2013-04-29 NOTE — Telephone Encounter (Signed)
, °

## 2013-05-05 DIAGNOSIS — E785 Hyperlipidemia, unspecified: Secondary | ICD-10-CM | POA: Diagnosis not present

## 2013-05-05 DIAGNOSIS — E039 Hypothyroidism, unspecified: Secondary | ICD-10-CM | POA: Diagnosis not present

## 2013-05-05 DIAGNOSIS — Z0000 Encounter for general adult medical examination without abnormal findings: Secondary | ICD-10-CM | POA: Diagnosis not present

## 2013-05-05 DIAGNOSIS — I10 Essential (primary) hypertension: Secondary | ICD-10-CM | POA: Diagnosis not present

## 2013-05-05 DIAGNOSIS — Z853 Personal history of malignant neoplasm of breast: Secondary | ICD-10-CM | POA: Diagnosis not present

## 2013-05-14 DIAGNOSIS — Z1212 Encounter for screening for malignant neoplasm of rectum: Secondary | ICD-10-CM | POA: Diagnosis not present

## 2013-05-14 LAB — IFOBT (OCCULT BLOOD): IFOBT: NEGATIVE

## 2013-07-07 ENCOUNTER — Ambulatory Visit: Payer: Medicare Other | Admitting: Oncology

## 2013-07-07 ENCOUNTER — Other Ambulatory Visit: Payer: Medicare Other

## 2013-07-13 DIAGNOSIS — M76899 Other specified enthesopathies of unspecified lower limb, excluding foot: Secondary | ICD-10-CM | POA: Diagnosis not present

## 2013-07-27 DIAGNOSIS — M19049 Primary osteoarthritis, unspecified hand: Secondary | ICD-10-CM | POA: Diagnosis not present

## 2013-07-28 DIAGNOSIS — M19049 Primary osteoarthritis, unspecified hand: Secondary | ICD-10-CM | POA: Diagnosis not present

## 2013-08-03 DIAGNOSIS — Z961 Presence of intraocular lens: Secondary | ICD-10-CM | POA: Diagnosis not present

## 2013-08-03 DIAGNOSIS — D3130 Benign neoplasm of unspecified choroid: Secondary | ICD-10-CM | POA: Diagnosis not present

## 2013-08-03 DIAGNOSIS — H2510 Age-related nuclear cataract, unspecified eye: Secondary | ICD-10-CM | POA: Diagnosis not present

## 2013-08-03 DIAGNOSIS — H52209 Unspecified astigmatism, unspecified eye: Secondary | ICD-10-CM | POA: Diagnosis not present

## 2013-08-18 DIAGNOSIS — I739 Peripheral vascular disease, unspecified: Secondary | ICD-10-CM | POA: Diagnosis not present

## 2013-08-18 DIAGNOSIS — L608 Other nail disorders: Secondary | ICD-10-CM | POA: Diagnosis not present

## 2013-09-23 DIAGNOSIS — Z1331 Encounter for screening for depression: Secondary | ICD-10-CM | POA: Diagnosis not present

## 2013-09-23 DIAGNOSIS — F329 Major depressive disorder, single episode, unspecified: Secondary | ICD-10-CM | POA: Diagnosis not present

## 2013-09-23 DIAGNOSIS — F3289 Other specified depressive episodes: Secondary | ICD-10-CM | POA: Diagnosis not present

## 2013-10-27 DIAGNOSIS — F4323 Adjustment disorder with mixed anxiety and depressed mood: Secondary | ICD-10-CM | POA: Diagnosis not present

## 2013-11-10 ENCOUNTER — Other Ambulatory Visit: Payer: Self-pay | Admitting: Emergency Medicine

## 2013-11-10 ENCOUNTER — Other Ambulatory Visit (HOSPITAL_BASED_OUTPATIENT_CLINIC_OR_DEPARTMENT_OTHER): Payer: Medicare Other

## 2013-11-10 DIAGNOSIS — C50411 Malignant neoplasm of upper-outer quadrant of right female breast: Secondary | ICD-10-CM

## 2013-11-10 DIAGNOSIS — C50419 Malignant neoplasm of upper-outer quadrant of unspecified female breast: Secondary | ICD-10-CM

## 2013-11-10 LAB — COMPREHENSIVE METABOLIC PANEL (CC13)
ALBUMIN: 3.6 g/dL (ref 3.5–5.0)
ALT: 16 U/L (ref 0–55)
AST: 21 U/L (ref 5–34)
Alkaline Phosphatase: 33 U/L — ABNORMAL LOW (ref 40–150)
Anion Gap: 8 mEq/L (ref 3–11)
BILIRUBIN TOTAL: 0.35 mg/dL (ref 0.20–1.20)
BUN: 17.5 mg/dL (ref 7.0–26.0)
CO2: 27 mEq/L (ref 22–29)
Calcium: 9.5 mg/dL (ref 8.4–10.4)
Chloride: 106 mEq/L (ref 98–109)
Creatinine: 0.8 mg/dL (ref 0.6–1.1)
GLUCOSE: 85 mg/dL (ref 70–140)
POTASSIUM: 4.3 meq/L (ref 3.5–5.1)
Sodium: 141 mEq/L (ref 136–145)
Total Protein: 6.3 g/dL — ABNORMAL LOW (ref 6.4–8.3)

## 2013-11-10 LAB — CBC WITH DIFFERENTIAL/PLATELET
BASO%: 0.6 % (ref 0.0–2.0)
BASOS ABS: 0 10*3/uL (ref 0.0–0.1)
EOS ABS: 0.1 10*3/uL (ref 0.0–0.5)
EOS%: 1.9 % (ref 0.0–7.0)
HCT: 41 % (ref 34.8–46.6)
HEMOGLOBIN: 13.3 g/dL (ref 11.6–15.9)
LYMPH%: 37.6 % (ref 14.0–49.7)
MCH: 30.7 pg (ref 25.1–34.0)
MCHC: 32.5 g/dL (ref 31.5–36.0)
MCV: 94.5 fL (ref 79.5–101.0)
MONO#: 0.6 10*3/uL (ref 0.1–0.9)
MONO%: 7.8 % (ref 0.0–14.0)
NEUT%: 52.1 % (ref 38.4–76.8)
NEUTROS ABS: 3.7 10*3/uL (ref 1.5–6.5)
PLATELETS: 192 10*3/uL (ref 145–400)
RBC: 4.34 10*6/uL (ref 3.70–5.45)
RDW: 13.8 % (ref 11.2–14.5)
WBC: 7.1 10*3/uL (ref 3.9–10.3)
lymph#: 2.7 10*3/uL (ref 0.9–3.3)

## 2013-11-11 DIAGNOSIS — M76899 Other specified enthesopathies of unspecified lower limb, excluding foot: Secondary | ICD-10-CM | POA: Diagnosis not present

## 2013-11-12 ENCOUNTER — Other Ambulatory Visit: Payer: Self-pay | Admitting: Emergency Medicine

## 2013-11-17 ENCOUNTER — Ambulatory Visit (HOSPITAL_BASED_OUTPATIENT_CLINIC_OR_DEPARTMENT_OTHER): Payer: Medicare Other | Admitting: Oncology

## 2013-11-17 ENCOUNTER — Telehealth: Payer: Self-pay | Admitting: Oncology

## 2013-11-17 VITALS — BP 164/73 | HR 72 | Temp 97.8°F | Resp 18 | Ht 61.0 in | Wt 130.9 lb

## 2013-11-17 DIAGNOSIS — C50411 Malignant neoplasm of upper-outer quadrant of right female breast: Secondary | ICD-10-CM

## 2013-11-17 DIAGNOSIS — Z170 Estrogen receptor positive status [ER+]: Secondary | ICD-10-CM

## 2013-11-17 DIAGNOSIS — F4323 Adjustment disorder with mixed anxiety and depressed mood: Secondary | ICD-10-CM | POA: Diagnosis not present

## 2013-11-17 DIAGNOSIS — C50919 Malignant neoplasm of unspecified site of unspecified female breast: Secondary | ICD-10-CM | POA: Diagnosis not present

## 2013-11-17 DIAGNOSIS — R61 Generalized hyperhidrosis: Secondary | ICD-10-CM | POA: Diagnosis not present

## 2013-11-17 NOTE — Telephone Encounter (Signed)
per pof to sch pt appt-gave pt copy of sch °

## 2013-11-17 NOTE — Progress Notes (Signed)
ID: [name]   DOB: 1930/09/09  MR#: [id number]  TZG#:[id number]   PCP:  Velna Hatchet, MD GYN: SU:  Excell Seltzer, MD;  OTHER:  Thea Silversmith, MD; Silvano Rusk, MD, [name] NP  CHIEF COMPLAINT:  Right Breast Cancer CURRENT THERAPY: Tamoxifen   HISTORY OF PRESENT ILLNESS: From the original intake note:  Ms. Sapp noted a mass in her right breast late December of 2011.  She brought this to Dr. Matthias Hughs attention and he set her up for mammography at [place] in [facility].  This showed a large spiculated mass in the posterior upper outer quadrant of the right breast which was palpable.  By ultrasound it measured 3.6 cm.  It was hypoechoic with internal blood flow.  Biopsy was suggested and performed January 17th, 2012.  The pathology from that biopsy ([id number]) showed an invasive ductal carcinoma, Grade 3, ER 95% and PR 47% positive with an MIB-1 of 50%, no HER2 amplification with a CISH ratio of 1.63.    With this information, the patient was referred to Dr. Barry Dienes, and bilateral breast MRIs were obtained March 20, 2010.  This showed in the posterior 3rd of the outer right breast a 3.8 cm mass with a clip artifact and in addition in the axillary tail of the right breast, a 9 mm rounded mildly irregular mass, corresponding to an abnormal appearing lymph node seen at mammography.  There were no other areas suspicious for enhancement.     Her subsequent history is as summarized below  INTERVAL HISTORY: Ginny returns today  accompanied by her canine companion/therapy dog, Max. She continues on tamoxifen, with good tolerance other than hot flashes. She prefers not to take any medicine for that. Vaginal wetness is not an issue.  REVIEW OF SYSTEMS: She is "moody", and is getting some counseling. She feels she is leading a "purpose less" life. 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 She does talk to one of her sons 3 times a week and other one every couple of months in the third when she talks maybe twice a year to. She walks 45 minutes in the morning and 30 minutes in the evening with Max. Otherwise a detailed review of systems today was entirely negative  PAST MEDICAL HISTORY: Past Medical History  Diagnosis Date  . Hypothyroidism   . Breast cancer      Right , chemo, radiation  . Scoliosis     L4 L5  . History of diverticulitis of colon   . Alopecia   . HTN (hypertension)   . Hypercholesteremia   . Seasonal allergies   . DDD (degenerative disc disease)   . SUI (stress urinary incontinence, female)   . Heart palpitations   1. Greater than 30 pack-year tobacco abuse, the patient quitting in the sixties.  2. History of sling procedure. 3. Seasonal allergies. 4. Status post hernia repair under Dr. Rise Patience. 5. Status post small bowel resection for diverticular disease in 2001.  6. Status post left cataract surgery. 7. History of tonsillectomy. 8. History of palpitations.   9. History of degenerative disk disease. 10. Hypothyroidism. 11. Hypercholesterolemia. 12. Hypertension. Stress urinary incontinence  PAST SURGICAL HISTORY: Past Surgical History  Procedure Laterality Date  . Breast lumpectomy  2012    right  . Tonsillectomy  2001    Diverticulitis  . Hernia repair      RIH, Dr. Rise Patience  . Port-a-cath removal    . Cataract extraction      left  . Bladder suspension      FAMILY HISTORY The patient's father died from a stroke at the age of 59.  The patient's mother died with congestive heart failure at the age of 55.  The patient has one sister who died with non-Hodgkin's lymphoma at the age of 51.  She has a brother who was a Manufacturing engineer and died from unknown causes.    GYNECOLOGIC HISTORY: She is GX P3, first  pregnancy to term at age 68.  She had menarche at age 62.  She went through menopause in her fifties. 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 Son Chilton Si lives in [place] where he works in Engineer, technical sales.  He is the patient's healthcare power of attorney and may be reached at [phone].  The patient's son Sherren Mocha also works in IT in the [place] area.  Son L. Terrance Winniger is head of [employer].  The patient has 5 grandchildren and 2 great-grandchildren. She attends [employer].      ADVANCED DIRECTIVES: In place  HEALTH MAINTENANCE: (Updated 12/08/2012) History  Substance Use Topics  . Smoking status: Former Smoker    Quit date: 08/09/1964  . Smokeless tobacco: Never Used  . Alcohol Use: Yes     Comment: occasional    Colonoscopy: never  PAP: 2001  Bone density: July 2014, normal  Lipid panel: Followed by Dr. Velna Hatchet   Allergies  Allergen Reactions  . Amoxicillin Rash  . Diphenhydramine Rash    "It is the red dye in Benadryl that I''m allergic to"  . Naproxen Sodium Rash    "It is the blue dye in naproxen that I'm allergic to."  . Lipitor [Atorvastatin]   . Hctz [Hydrochlorothiazide] Itching    Current Outpatient Prescriptions  Medication Sig Dispense Refill  . aspirin 81 MG tablet Take 81 mg by mouth daily.      . fish oil-omega-3 fatty acids 1000 MG capsule Take 2 g by mouth daily.       Marland Kitchen levothyroxine (SYNTHROID) 100 MCG tablet Take 100 mcg by mouth daily.        Marland Kitchen LORazepam (ATIVAN) 0.5 MG tablet Take 1 tablet (0.5 mg total) by mouth daily as needed for anxiety.  90 tablet  0  . Multiple Vitamin (MULTIVITAMIN PO) Take by mouth daily.        . Naproxen Sodium (ALEVE PO) Take by mouth as needed.        .  tamoxifen (NOLVADEX) 20 MG tablet Take 1 tablet (20 mg total) by mouth daily.  90 tablet  12   No current facility-administered medications for this visit.    OBJECTIVE: Middle-aged white woman in no acute distress Filed Vitals:   11/17/13 1402  BP: 164/73  Pulse: 72  Temp: 97.8 F (36.6 C)  Resp: 18     Body mass index is 24.75 kg/(m^2).    ECOG FS: 0 Filed Weights   11/17/13 1402  Weight: 130 lb 14.4 oz (59.376 kg)    Sclerae unicteric, EOMs intact Oropharynx clear and moist,  teeth in good repair No cervical or supraclavicular adenopathy Lungs no rales or rhonchi Heart regular rate and rhythm Abd soft, nontender, positive bowel sounds MSK no focal spinal tenderness, no upper extremity lymphedema Neuro: nonfocal, well oriented, appropriate affect Breasts: The right breast is status post lumpectomy and radiation. There is no evidence of local recurrence. The right axilla is benign The left breast is unremarkable.    LAB RESULTS: Lab Results  Component Value Date   WBC 7.1 11/10/2013   NEUTROABS 3.7 11/10/2013   HGB 13.3 11/10/2013   HCT 41.0 11/10/2013   MCV 94.5 11/10/2013   PLT 192 11/10/2013      Chemistry      Component Value Date/Time   NA 141 11/10/2013 1300   NA 138 08/10/2011 1405   K 4.3 11/10/2013 1300   K 3.7 08/10/2011 1405   CL 104 08/04/2012 1350   CL 99 08/10/2011 1405   CO2 27 11/10/2013 1300   CO2 29 08/10/2011 1405   BUN 17.5 11/10/2013 1300   BUN 21 08/10/2011 1405   CREATININE 0.8 11/10/2013 1300   CREATININE 0.67 08/10/2011 1405      Component Value Date/Time   CALCIUM 9.5 11/10/2013 1300   CALCIUM 9.8 08/10/2011 1405   ALKPHOS 33* 11/10/2013 1300   ALKPHOS 34* 08/10/2011 1054   AST 21 11/10/2013 1300   AST 17 08/10/2011 1054   ALT 16 11/10/2013 1300   ALT 14 08/10/2011 1054   BILITOT 0.35 11/10/2013 1300   BILITOT 0.6 08/10/2011 1054      STUDIES: CLINICAL DATA: History of right lumpectomy in 2012.  EXAM:  DIGITAL DIAGNOSTIC bilateral MAMMOGRAM WITH  CAD  COMPARISON: 03/31/2012 and earlier  ACR Breast Density Category b: There are scattered areas of  fibroglandular density.  FINDINGS:  No suspicious mass, distortion, or microcalcifications are  identified to suggest presence of malignancy. Postoperative changes  are identified in the lateral aspect of the right breast.  Mammographic images were processed with CAD.  IMPRESSION:  No mammographic evidence for malignancy.  RECOMMENDATION:  Diagnostic mammogram is suggested in 1 year. (Code:DM-B-01Y)  I have discussed the findings and recommendations with the patient.  Results were also provided in writing at the conclusion of the  visit. If applicable, a reminder letter will be sent to the patient  regarding the next appointment.  BI-RADS CATEGORY 2: Benign finding(s).  Electronically Signed  By: Shon Hale M.D.  On: 04/01/2013 17:15   Bone density on 08/27/2012 was normal.    ASSESSMENT: 80 y.o. [place] woman,   (1)  status post right breast biopsy March 17, 2010 for a grade 3 invasive ductal carcinoma measuring 3.8 cm by MRI with a suspicious right axillary lymph node which was, however, negative by biopsy, the tumor being estrogen and progesterone receptor positive, HER2-negative, with an MIB-1 of 50%.    (2)  Neoadjuvantly, she was treated with dose-dense doxorubicin and cyclophosphamide x4 followed by weekly paclitaxel x 12, completed July 2012 followed by definitive right lumpectomy and sentinel lymph node sampling August 2012 showing a complete pathologic response.   (3)  She completed radiation treatments November 2012 and started tamoxifen at that time.  (4)  discontinued tamoxifen in approximately July of 2014, and was switched to anastrozole. Anastrozole for approximately 2 weeks between 10/31/2012 and 11/12/2012,  discontinued secondary to bone and muscle pain.  (5)  started letrozole October 2014, discontinued because of headaches, nausea, vomiting, and aches  and pains.  (6) resumed tamoxifen in November  2014    PLAN:  Alanni is doing fine as far as her breast cancer is concerned. She really doesn't want to do anything about hot flashes which hopefully eventually with time may become less frequent and less intense. The plan is to continue tamoxifen for total of 5 years, which is going to take is to November of 2017.  I think the situation she is facing is 1, and too many red diabetes who are divorced her widowed. It is made more difficult because there is no family nearby. She is not interested in theology, which are met Mr. Gasper Sells, and I suggested she give [place] a try. She will give me a report at the next visit here, which will be in 6 months.  She knows to call for any problems that may develop before that.  Merrill Deanda C    11/17/2013

## 2013-12-02 DIAGNOSIS — F4323 Adjustment disorder with mixed anxiety and depressed mood: Secondary | ICD-10-CM | POA: Diagnosis not present

## 2013-12-17 DIAGNOSIS — L603 Nail dystrophy: Secondary | ICD-10-CM | POA: Diagnosis not present

## 2013-12-17 DIAGNOSIS — I739 Peripheral vascular disease, unspecified: Secondary | ICD-10-CM | POA: Diagnosis not present

## 2013-12-28 DIAGNOSIS — F4323 Adjustment disorder with mixed anxiety and depressed mood: Secondary | ICD-10-CM | POA: Diagnosis not present

## 2014-02-08 DIAGNOSIS — M4806 Spinal stenosis, lumbar region: Secondary | ICD-10-CM | POA: Diagnosis not present

## 2014-02-08 DIAGNOSIS — M7061 Trochanteric bursitis, right hip: Secondary | ICD-10-CM | POA: Diagnosis not present

## 2014-03-01 DIAGNOSIS — F4323 Adjustment disorder with mixed anxiety and depressed mood: Secondary | ICD-10-CM | POA: Diagnosis not present

## 2014-03-03 ENCOUNTER — Other Ambulatory Visit: Payer: Self-pay | Admitting: Oncology

## 2014-03-03 DIAGNOSIS — C50411 Malignant neoplasm of upper-outer quadrant of right female breast: Secondary | ICD-10-CM

## 2014-03-11 DIAGNOSIS — L603 Nail dystrophy: Secondary | ICD-10-CM | POA: Diagnosis not present

## 2014-03-11 DIAGNOSIS — I739 Peripheral vascular disease, unspecified: Secondary | ICD-10-CM | POA: Diagnosis not present

## 2014-03-15 DIAGNOSIS — F4323 Adjustment disorder with mixed anxiety and depressed mood: Secondary | ICD-10-CM | POA: Diagnosis not present

## 2014-04-26 ENCOUNTER — Other Ambulatory Visit: Payer: Self-pay | Admitting: Oncology

## 2014-04-26 DIAGNOSIS — F4323 Adjustment disorder with mixed anxiety and depressed mood: Secondary | ICD-10-CM | POA: Diagnosis not present

## 2014-04-26 DIAGNOSIS — Z853 Personal history of malignant neoplasm of breast: Secondary | ICD-10-CM

## 2014-05-03 DIAGNOSIS — J029 Acute pharyngitis, unspecified: Secondary | ICD-10-CM | POA: Diagnosis not present

## 2014-05-03 LAB — IFOBT (OCCULT BLOOD): IMMUNOLOGICAL FECAL OCCULT BLOOD TEST: POSITIVE

## 2014-05-04 ENCOUNTER — Ambulatory Visit
Admission: RE | Admit: 2014-05-04 | Discharge: 2014-05-04 | Disposition: A | Discharge status: Home / Self Care | Payer: Medicare Other | Source: Ambulatory Visit | Attending: Oncology | Admitting: Oncology

## 2014-05-04 DIAGNOSIS — Z853 Personal history of malignant neoplasm of breast: Secondary | ICD-10-CM

## 2014-05-04 DIAGNOSIS — R922 Inconclusive mammogram: Secondary | ICD-10-CM | POA: Diagnosis not present

## 2014-05-05 DIAGNOSIS — M4806 Spinal stenosis, lumbar region: Secondary | ICD-10-CM | POA: Diagnosis not present

## 2014-05-05 DIAGNOSIS — M7061 Trochanteric bursitis, right hip: Secondary | ICD-10-CM | POA: Diagnosis not present

## 2014-05-14 ENCOUNTER — Other Ambulatory Visit: Payer: Self-pay | Admitting: Oncology

## 2014-05-18 ENCOUNTER — Other Ambulatory Visit (HOSPITAL_BASED_OUTPATIENT_CLINIC_OR_DEPARTMENT_OTHER): Payer: Medicare Other

## 2014-05-18 DIAGNOSIS — C50411 Malignant neoplasm of upper-outer quadrant of right female breast: Secondary | ICD-10-CM

## 2014-05-18 DIAGNOSIS — C50811 Malignant neoplasm of overlapping sites of right female breast: Secondary | ICD-10-CM

## 2014-05-18 LAB — CBC WITH DIFFERENTIAL/PLATELET
BASO%: 0.3 % (ref 0.0–2.0)
Basophils Absolute: 0 10*3/uL (ref 0.0–0.1)
EOS%: 5.4 % (ref 0.0–7.0)
Eosinophils Absolute: 0.4 10*3/uL (ref 0.0–0.5)
HCT: 42.2 % (ref 34.8–46.6)
HGB: 14 g/dL (ref 11.6–15.9)
LYMPH%: 34.7 % (ref 14.0–49.7)
MCH: 31.3 pg (ref 25.1–34.0)
MCHC: 33.2 g/dL (ref 31.5–36.0)
MCV: 94.2 fL (ref 79.5–101.0)
MONO#: 0.4 10*3/uL (ref 0.1–0.9)
MONO%: 6.3 % (ref 0.0–14.0)
NEUT#: 3.5 10*3/uL (ref 1.5–6.5)
NEUT%: 53.3 % (ref 38.4–76.8)
PLATELETS: 173 10*3/uL (ref 145–400)
RBC: 4.48 10*6/uL (ref 3.70–5.45)
RDW: 13.9 % (ref 11.2–14.5)
WBC: 6.5 10*3/uL (ref 3.9–10.3)
lymph#: 2.3 10*3/uL (ref 0.9–3.3)

## 2014-05-18 LAB — COMPREHENSIVE METABOLIC PANEL (CC13)
ALK PHOS: 36 U/L — AB (ref 40–150)
ALT: 19 U/L (ref 0–55)
ANION GAP: 10 meq/L (ref 3–11)
AST: 21 U/L (ref 5–34)
Albumin: 3.5 g/dL (ref 3.5–5.0)
BILIRUBIN TOTAL: 0.58 mg/dL (ref 0.20–1.20)
BUN: 16.1 mg/dL (ref 7.0–26.0)
CO2: 27 mEq/L (ref 22–29)
Calcium: 9.1 mg/dL (ref 8.4–10.4)
Chloride: 105 mEq/L (ref 98–109)
Creatinine: 0.8 mg/dL (ref 0.6–1.1)
EGFR: 74 mL/min/{1.73_m2} — AB (ref >90)
Glucose: 89 mg/dl (ref 70–140)
Potassium: 3.9 mEq/L (ref 3.5–5.1)
SODIUM: 143 meq/L (ref 136–145)
Total Protein: 6.3 g/dL — ABNORMAL LOW (ref 6.4–8.3)

## 2014-05-24 DIAGNOSIS — F4323 Adjustment disorder with mixed anxiety and depressed mood: Secondary | ICD-10-CM | POA: Diagnosis not present

## 2014-05-25 ENCOUNTER — Telehealth: Payer: Self-pay | Admitting: Oncology

## 2014-05-25 ENCOUNTER — Ambulatory Visit (HOSPITAL_BASED_OUTPATIENT_CLINIC_OR_DEPARTMENT_OTHER): Payer: Medicare Other | Admitting: Oncology

## 2014-05-25 VITALS — BP 159/58 | HR 64 | Temp 98.2°F | Ht 61.0 in | Wt 131.0 lb

## 2014-05-25 DIAGNOSIS — Z853 Personal history of malignant neoplasm of breast: Secondary | ICD-10-CM | POA: Diagnosis not present

## 2014-05-25 DIAGNOSIS — C50411 Malignant neoplasm of upper-outer quadrant of right female breast: Secondary | ICD-10-CM

## 2014-05-25 DIAGNOSIS — C50419 Malignant neoplasm of upper-outer quadrant of unspecified female breast: Secondary | ICD-10-CM

## 2014-05-25 MED ORDER — LORAZEPAM 0.5 MG PO TABS: 0.5000 mg | ORAL_TABLET | Freq: Every day | ORAL | Status: DC | PRN

## 2014-05-25 MED ORDER — OMEPRAZOLE 40 MG PO CPDR
40.0000 mg | DELAYED_RELEASE_CAPSULE | Freq: Every day | ORAL | Status: DC
Start: 2014-05-25 — End: 2015-07-12

## 2014-05-25 NOTE — Telephone Encounter (Signed)
per pof to sch pt appt-gave pt copy of sch °

## 2014-05-25 NOTE — Progress Notes (Signed)
ID: Alice [name]   DOB: 1930/07/05  MR#: [id number]  WUJ#:[id number]   PCP:  Alice Hatchet, MD GYN: SU:  Alice Seltzer, MD;  OTHER:  Alice Silversmith, MD; Alice Rusk, MD, Alice Ramal NP  CHIEF COMPLAINT:  Right Breast Cancer CURRENT Alice: Tamoxifen   HISTORY OF PRESENT ILLNESS: From Alice original intake note:  Alice [name] noted a mass in her right breast late December of 2011.  She brought this to Alice [name] attention and he set her up for mammography at [facility] in [facility].  This showed a large spiculated mass in Alice posterior upper outer quadrant of Alice right breast which was palpable.  By ultrasound it measured 3.6 cm.  It was hypoechoic with internal blood flow.  Biopsy was suggested and performed January 17th, 2012.  Alice pathology from that biopsy ([id number]) showed an invasive ductal carcinoma, Grade 3, ER 95% and PR 47% positive with an MIB-1 of 50%, no HER2 amplification with a CISH ratio of 1.63.    With this information, Alice patient was referred to Alice [name], and bilateral breast MRIs were obtained March 20, 2010.  This showed in Alice posterior 3rd of Alice outer right breast a 3.8 cm mass with a clip artifact and in addition in Alice axillary tail of Alice right breast, a 9 mm rounded mildly irregular mass, corresponding to an abnormal appearing lymph node seen at mammography.  There were no other areas suspicious for enhancement.     Her subsequent history is as summarized below  INTERVAL HISTORY: Alice [name] returns today  accompanied by her canine significant other, Alice [name]. She is tolerating tamoxifen with mild hot flashes and no vaginal discharge. She obtains it free through her drug plan.  REVIEW OF SYSTEMS: She complains of some fatigue but she walks [name] 4 times a day which as her chief form of exercise. They just returned from a week in [place]. She can be short of breath when walking up hill. She gained about 10 pounds over Alice winter she  says. She has some stress urinary incontinence. She feels anxious but not depressed. A detailed review of systems today was otherwise noncontributory  PAST MEDICAL HISTORY: Past Medical History  Diagnosis Date  . Hypothyroidism   . Breast cancer      Right , chemo, radiation  . Scoliosis     L4 L5  . History of diverticulitis of colon   . Alopecia   . HTN (hypertension)   . Hypercholesteremia   . Seasonal allergies   . DDD (degenerative disc disease)   . SUI (stress urinary incontinence, female)   . Heart palpitations   1. Greater than 30 pack-year tobacco abuse, Alice patient quitting in Alice sixties.  2. History of sling procedure. 3. Seasonal allergies. 4. Status post hernia repair under Dr. Rise [name]. 5. Status post small bowel resection for diverticular disease in 2001.  6. Status post left cataract surgery. 7. History of tonsillectomy. 8. History of palpitations.   9. History of degenerative disk disease. 10. Hypothyroidism. 11. Hypercholesterolemia. 12. Hypertension. Stress urinary incontinence  PAST SURGICAL HISTORY: Past Surgical History  Procedure Laterality Date  . Breast lumpectomy  2012    right  . Tonsillectomy  2001    Diverticulitis  . Hernia repair      RIH, Dr. Rise [name]  . Port-a-cath removal    . Cataract extraction      left  . Bladder suspension      FAMILY HISTORY Alice patient's  father died from a stroke at Alice age of 35.  Alice patient's mother died with congestive heart failure at Alice age of 25.  Alice patient has one sister who died with non-Hodgkin's lymphoma at Alice age of 108.  She has a brother who was a Manufacturing engineer and died from unknown causes.    GYNECOLOGIC HISTORY: She is GX P3, first pregnancy to term at age 89.  She had menarche at age 59.  She went through menopause in her fifties. She took hormone replacement until Alice time of her breast cancer diagnosis  SOCIAL HISTORY:  (Updated 12/08/2012) She worked as a Armed forces technical officer. She is divorced and lives by herself with her canine companion, [name], who is a Alice [name] terrier and works as a Chief Technology Officer. Son Alice [name] lives in [place] where he works in Engineer, technical sales.  He is Alice patient's healthcare power of attorney and may be reached at [phone].  Alice patient's son Alice [name] also works in IT in [place] area.  Son Alice [name] is head of [employer].  Alice patient has 5 grandchildren and 2 great-grandchildren. She attends Alice [name].      ADVANCED DIRECTIVES: In place  HEALTH MAINTENANCE: (Updated 12/08/2012) History  Substance Use Topics  . Smoking status: Former Smoker    Quit date: 08/09/1964  . Smokeless tobacco: Never Used  . Alcohol Use: Yes     Comment: occasional    Colonoscopy: never  PAP: 2001  Bone density: July 2014, normal  Lipid panel: Followed by Dr. Velna [name]   Allergies  Allergen Reactions  . Amoxicillin Rash  . Diphenhydramine Rash    "It is Alice red dye in Benadryl that I''m allergic to"  . Naproxen Sodium Rash    "It is Alice blue dye in naproxen that I'm allergic to."  . Lipitor [Atorvastatin]   . Hctz [Hydrochlorothiazide] Itching    Current Outpatient Prescriptions  Medication Sig Dispense Refill  . aspirin 81 MG tablet Take 81 mg by mouth daily.    . fish oil-omega-3 fatty acids 1000 MG capsule Take 2 g by mouth daily.     Marland Kitchen levothyroxine (SYNTHROID) 100 MCG tablet Take 100 mcg by mouth daily.      Marland Kitchen LORazepam (ATIVAN) 0.5 MG tablet Take 1 tablet (0.5 mg total) by mouth daily as needed for anxiety. 90 tablet 0  . Multiple Vitamin (MULTIVITAMIN PO) Take by mouth daily.      . Naproxen Sodium (ALEVE PO) Take by mouth as needed.      . tamoxifen (NOLVADEX) 20 MG tablet TAKE 1 TABLET DAILY 90 tablet 3   No current facility-administered medications for this visit.    OBJECTIVE: Middle-aged white woman who appears well Filed Vitals:   05/25/14 1351  BP: 159/58  Pulse: 64  Temp: 98.2 F  (36.8 C)     Body mass index is 24.76 kg/(m^2).    ECOG FS: 1 Filed Weights   05/25/14 1351  Weight: 131 lb (59.421 kg)    Sclerae unicteric, pupils round and equal Oropharynx clear and moist, good dentition No cervical or supraclavicular adenopathy Lungs no rales or rhonchi Heart regular rate and rhythm Abd soft, nontender, positive bowel sounds MSK no focal spinal tenderness, no upper extremity lymphedema Neuro: nonfocal, well oriented, positive affect Breasts: Alice right breast is status post lumpectomy and radiation. There is no evidence of local recurrence. Alice right axilla is benign Alice left breast is unremarkable.    LAB  RESULTS: Lab Results  Component Value Date   WBC 6.5 05/18/2014   NEUTROABS 3.5 05/18/2014   HGB 14.0 05/18/2014   HCT 42.2 05/18/2014   MCV 94.2 05/18/2014   PLT 173 05/18/2014      Chemistry      Component Value Date/Time   NA 143 05/18/2014 1003   NA 138 08/10/2011 1405   K 3.9 05/18/2014 1003   K 3.7 08/10/2011 1405   CL 104 08/04/2012 1350   CL 99 08/10/2011 1405   CO2 27 05/18/2014 1003   CO2 29 08/10/2011 1405   BUN 16.1 05/18/2014 1003   BUN 21 08/10/2011 1405   CREATININE 0.8 05/18/2014 1003   CREATININE 0.67 08/10/2011 1405      Component Value Date/Time   CALCIUM 9.1 05/18/2014 1003   CALCIUM 9.8 08/10/2011 1405   ALKPHOS 36* 05/18/2014 1003   ALKPHOS 34* 08/10/2011 1054   AST 21 05/18/2014 1003   AST 17 08/10/2011 1054   ALT 19 05/18/2014 1003   ALT 14 08/10/2011 1054   BILITOT 0.58 05/18/2014 1003   BILITOT 0.6 08/10/2011 1054      STUDIES: CLINICAL DATA: History of right lumpectomy in 2012.  EXAM:  DIGITAL DIAGNOSTIC bilateral MAMMOGRAM WITH CAD  COMPARISON: 03/31/2012 and earlier  ACR Breast Density Category b: There are scattered areas of  fibroglandular density.  FINDINGS:  No suspicious mass, distortion, or microcalcifications are  identified to suggest presence of malignancy. Postoperative changes  are  identified in Alice lateral aspect of Alice right breast.  Mammographic images were processed with CAD.  IMPRESSION:  No mammographic evidence for malignancy.  RECOMMENDATION:  Diagnostic mammogram is suggested in 1 year. (Code:[id number]-B-01Y)  I have discussed Alice findings and recommendations with Alice patient.  Results were also provided in writing at Alice conclusion of Alice  visit. If applicable, a reminder letter will be sent to Alice patient  regarding Alice next appointment.  BI-RADS CATEGORY 2: Benign finding(s).  Electronically Signed  By: Shon Hale M.D.  On: 04/01/2013 17:15  ASSESSMENT: 81 y.o. [place] woman,   (1)  status post right breast biopsy March 17, 2010 for a grade 3 invasive ductal carcinoma measuring 3.8 cm by MRI with a suspicious right axillary lymph node which was, however, negative by biopsy, Alice tumor being estrogen and progesterone receptor positive, HER2-negative, with an MIB-1 of 50%.    (2)  Neoadjuvantly, she was treated with dose-dense doxorubicin and cyclophosphamide x4 followed by weekly paclitaxel x 12, completed July 2012 followed by definitive right lumpectomy and sentinel lymph node sampling August 2012 showing a complete pathologic response.   (3)  She completed radiation treatments November 2012 and started tamoxifen at that time.  (4)  discontinued tamoxifen in approximately July of 2014, and was switched to anastrozole. Anastrozole for approximately 2 weeks between 10/31/2012 and 11/12/2012,  discontinued secondary to bone and muscle [name].  (5)  started letrozole October 2014, discontinued because of headaches, nausea, vomiting, and aches and pains.  (6) resumed tamoxifen in November 2014    PLAN:  Derotha is now nearly 4 years out from her definitive surgery area she is tolerating Alice tamoxifen well. Alice overall plan is to continue tamoxifen for a total of 10 years. At this point I'm comfortable seeing her on a once a year basis.  She is considering hip  replacement. Alice chief problem there is will care for her dog while she is sick. Possibly one of her children could come to town or she  could hire a Development worker, community. Currently she is taking Aleve twice a day plus aspirin and she is not on a proton pump inhibitor. I went ahead and wrote her for omeprazole 40 mg to take on a daily basis.  She knows to call for any problems that may develop before her next visit here.  Mackey Varricchio C    05/25/2014

## 2014-06-07 DIAGNOSIS — M7061 Trochanteric bursitis, right hip: Secondary | ICD-10-CM | POA: Diagnosis not present

## 2014-06-07 DIAGNOSIS — M4806 Spinal stenosis, lumbar region: Secondary | ICD-10-CM | POA: Diagnosis not present

## 2014-06-15 DIAGNOSIS — E785 Hyperlipidemia, unspecified: Secondary | ICD-10-CM | POA: Diagnosis not present

## 2014-06-15 DIAGNOSIS — E039 Hypothyroidism, unspecified: Secondary | ICD-10-CM | POA: Diagnosis not present

## 2014-06-15 DIAGNOSIS — I10 Essential (primary) hypertension: Secondary | ICD-10-CM | POA: Diagnosis not present

## 2014-06-21 DIAGNOSIS — F4323 Adjustment disorder with mixed anxiety and depressed mood: Secondary | ICD-10-CM | POA: Diagnosis not present

## 2014-06-22 DIAGNOSIS — Z853 Personal history of malignant neoplasm of breast: Secondary | ICD-10-CM | POA: Diagnosis not present

## 2014-06-22 DIAGNOSIS — R600 Localized edema: Secondary | ICD-10-CM | POA: Diagnosis not present

## 2014-06-22 DIAGNOSIS — Z0000 Encounter for general adult medical examination without abnormal findings: Secondary | ICD-10-CM | POA: Diagnosis not present

## 2014-06-22 DIAGNOSIS — I10 Essential (primary) hypertension: Secondary | ICD-10-CM | POA: Diagnosis not present

## 2014-06-22 DIAGNOSIS — E785 Hyperlipidemia, unspecified: Secondary | ICD-10-CM | POA: Diagnosis not present

## 2014-06-22 DIAGNOSIS — E039 Hypothyroidism, unspecified: Secondary | ICD-10-CM | POA: Diagnosis not present

## 2014-06-22 DIAGNOSIS — Z23 Encounter for immunization: Secondary | ICD-10-CM | POA: Diagnosis not present

## 2014-06-22 DIAGNOSIS — M1612 Unilateral primary osteoarthritis, left hip: Secondary | ICD-10-CM | POA: Diagnosis not present

## 2014-06-22 DIAGNOSIS — R58 Hemorrhage, not elsewhere classified: Secondary | ICD-10-CM | POA: Diagnosis not present

## 2014-06-22 DIAGNOSIS — F329 Major depressive disorder, single episode, unspecified: Secondary | ICD-10-CM | POA: Diagnosis not present

## 2014-07-06 DIAGNOSIS — M25551 Pain in right hip: Secondary | ICD-10-CM | POA: Diagnosis not present

## 2014-07-06 DIAGNOSIS — M1611 Unilateral primary osteoarthritis, right hip: Secondary | ICD-10-CM | POA: Diagnosis not present

## 2014-07-14 DIAGNOSIS — F4323 Adjustment disorder with mixed anxiety and depressed mood: Secondary | ICD-10-CM | POA: Diagnosis not present

## 2014-07-22 DIAGNOSIS — Z1212 Encounter for screening for malignant neoplasm of rectum: Secondary | ICD-10-CM | POA: Diagnosis not present

## 2014-07-22 LAB — IFOBT (OCCULT BLOOD): IFOBT: NEGATIVE

## 2014-07-28 DIAGNOSIS — M4806 Spinal stenosis, lumbar region: Secondary | ICD-10-CM | POA: Diagnosis not present

## 2014-07-28 DIAGNOSIS — M25551 Pain in right hip: Secondary | ICD-10-CM | POA: Diagnosis not present

## 2014-08-10 DIAGNOSIS — R600 Localized edema: Secondary | ICD-10-CM | POA: Diagnosis not present

## 2014-08-10 DIAGNOSIS — R0609 Other forms of dyspnea: Secondary | ICD-10-CM | POA: Diagnosis not present

## 2014-08-10 DIAGNOSIS — Z01818 Encounter for other preprocedural examination: Secondary | ICD-10-CM | POA: Diagnosis not present

## 2014-08-12 ENCOUNTER — Other Ambulatory Visit (HOSPITAL_COMMUNITY): Payer: Self-pay | Admitting: Orthopaedic Surgery

## 2014-08-18 ENCOUNTER — Other Ambulatory Visit: Payer: Self-pay | Admitting: Oncology

## 2014-08-18 DIAGNOSIS — R600 Localized edema: Secondary | ICD-10-CM | POA: Diagnosis not present

## 2014-08-18 DIAGNOSIS — C50419 Malignant neoplasm of upper-outer quadrant of unspecified female breast: Secondary | ICD-10-CM

## 2014-08-18 DIAGNOSIS — R0609 Other forms of dyspnea: Secondary | ICD-10-CM | POA: Diagnosis not present

## 2014-08-20 MED ORDER — LORAZEPAM 0.5 MG PO TABS: 0.5000 mg | ORAL_TABLET | Freq: Every day | ORAL | Status: DC | PRN

## 2014-08-20 NOTE — Addendum Note (Signed)
Addended by: Janace Hoard on: 08/20/2014 03:32 PM   Modules accepted: Orders

## 2014-08-24 ENCOUNTER — Other Ambulatory Visit: Payer: Self-pay | Admitting: *Deleted

## 2014-09-02 NOTE — Patient Instructions (Addendum)
YOUR PROCEDURE IS SCHEDULED ON : 09/10/14  REPORT TO [facility] MAIN ENTRANCE FOLLOW SIGNS TO EAST ELEVATOR - GO TO 3rd FLOOR CHECK IN AT 3 EAST NURSES STATION (SHORT STAY) AT:  7:45 AM  CALL THIS NUMBER IF YOU HAVE PROBLEMS THE MORNING OF SURGERY [phone]  REMEMBER:ONLY 1 PER PERSON MAY GO TO SHORT STAY WITH YOU TO GET READY THE MORNING OF YOUR SURGERY  DO NOT EAT FOOD OR DRINK LIQUIDS AFTER MIDNIGHT  TAKE THESE MEDICINES THE MORNING OF SURGERY: SYNTHROID / PRILOSEC / TAMOXIFEN  YOU MAY NOT HAVE ANY METAL ON YOUR BODY INCLUDING HAIR PINS AND PIERCING'S. DO NOT WEAR JEWELRY, MAKEUP, LOTIONS, POWDERS OR PERFUMES. DO NOT WEAR NAIL POLISH. DO NOT SHAVE 48 HRS PRIOR TO SURGERY. MEN MAY SHAVE FACE AND NECK.  DO NOT [place]. [facility] IS NOT RESPONSIBLE FOR VALUABLES.  CONTACTS, DENTURES OR PARTIALS MAY NOT BE WORN TO SURGERY. LEAVE SUITCASE IN CAR. CAN BE BROUGHT TO ROOM AFTER SURGERY.  PATIENTS DISCHARGED THE DAY OF SURGERY WILL NOT BE ALLOWED TO DRIVE HOME.  PLEASE READ OVER THE FOLLOWING INSTRUCTION SHEETS _________________________________________________________________________________                                          [facility] - PREPARING FOR SURGERY  Before surgery, you can play an important role.  Because skin is not sterile, your skin needs to be as free of germs as possible.  You can reduce the number of germs on your skin by washing with CHG (chlorahexidine gluconate) soap before surgery.  CHG is an antiseptic cleaner which kills germs and bonds with the skin to continue killing germs even after washing. Please DO NOT use if you have an allergy to CHG or antibacterial soaps.  If your skin becomes reddened/irritated stop using the CHG and inform your nurse when you arrive at Short Stay. Do not shave (including legs and underarms) for at least 48 hours prior to the first CHG shower.  You may shave your face. Please follow  these instructions carefully:   1.  Shower with CHG Soap the night before surgery and the  morning of Surgery.   2.  If you choose to wash your hair, wash your hair first as usual with your  normal  Shampoo.   3.  After you shampoo, rinse your hair and body thoroughly to remove the  shampoo.                                         4.  Use CHG as you would any other liquid soap.  You can apply chg directly  to the skin and wash . Gently wash with scrungie or clean wascloth    5.  Apply the CHG Soap to your body ONLY FROM THE NECK DOWN.   Do not use on open                           Wound or open sores. Avoid contact with eyes, ears mouth and genitals (private parts).                        Genitals (private parts) with your normal  soap.              6.  Wash thoroughly, paying special attention to the area where your surgery  will be performed.   7.  Thoroughly rinse your body with warm water from the neck down.   8.  DO NOT shower/wash with your normal soap after using and rinsing off  the CHG Soap .                9.  Pat yourself dry with a clean towel.             10.  Wear clean night clothes to bed after shower             11.  Place clean sheets on your bed the night of your first shower and do not  sleep with pets.  Day of Surgery : Do not apply any lotions/deodorants the morning of surgery.  Please wear clean clothes to the hospital/surgery center.  FAILURE TO FOLLOW THESE INSTRUCTIONS MAY RESULT IN THE CANCELLATION OF YOUR SURGERY    PATIENT SIGNATURE_________________________________  ______________________________________________________________________

## 2014-09-03 ENCOUNTER — Encounter (HOSPITAL_COMMUNITY): Payer: Self-pay

## 2014-09-03 ENCOUNTER — Encounter (HOSPITAL_COMMUNITY)
Admission: RE | Admit: 2014-09-03 | Discharge: 2014-09-03 | Disposition: A | Discharge status: Home / Self Care | Payer: Medicare Other | Source: Ambulatory Visit | Attending: Orthopaedic Surgery | Admitting: Orthopaedic Surgery

## 2014-09-03 DIAGNOSIS — M1611 Unilateral primary osteoarthritis, right hip: Secondary | ICD-10-CM | POA: Diagnosis not present

## 2014-09-03 DIAGNOSIS — Z01812 Encounter for preprocedural laboratory examination: Secondary | ICD-10-CM | POA: Insufficient documentation

## 2014-09-03 HISTORY — DX: Bitten or stung by nonvenomous insect and other nonvenomous arthropods, initial encounter: W57.XXXA

## 2014-09-03 HISTORY — DX: Personal history of other diseases of the digestive system: Z87.19

## 2014-09-03 LAB — CBC
HEMATOCRIT: 41 % (ref 36.0–46.0)
HEMOGLOBIN: 13.1 g/dL (ref 12.0–15.0)
MCH: 29.6 pg (ref 26.0–34.0)
MCHC: 32 g/dL (ref 30.0–36.0)
MCV: 92.6 fL (ref 78.0–100.0)
Platelets: 132 10*3/uL — ABNORMAL LOW (ref 150–400)
RBC: 4.43 MIL/uL (ref 3.87–5.11)
RDW: 13.7 % (ref 11.5–15.5)
WBC: 7.1 10*3/uL (ref 4.0–10.5)

## 2014-09-03 LAB — BASIC METABOLIC PANEL
Anion gap: 7 (ref 5–15)
BUN: 23 mg/dL — ABNORMAL HIGH (ref 6–20)
CALCIUM: 9.3 mg/dL (ref 8.9–10.3)
CHLORIDE: 103 mmol/L (ref 101–111)
CO2: 30 mmol/L (ref 22–32)
CREATININE: 0.78 mg/dL (ref 0.44–1.00)
GFR calc Af Amer: >60 mL/min (ref >60)
GFR calc non Af Amer: >60 mL/min (ref >60)
GLUCOSE: 86 mg/dL (ref 65–99)
Potassium: 4.2 mmol/L (ref 3.5–5.1)
Sodium: 140 mmol/L (ref 135–145)

## 2014-09-03 LAB — SURGICAL PCR SCREEN
MRSA, PCR: NEGATIVE
STAPHYLOCOCCUS AUREUS: NEGATIVE

## 2014-09-06 DIAGNOSIS — Z961 Presence of intraocular lens: Secondary | ICD-10-CM | POA: Diagnosis not present

## 2014-09-06 DIAGNOSIS — H2511 Age-related nuclear cataract, right eye: Secondary | ICD-10-CM | POA: Diagnosis not present

## 2014-09-06 DIAGNOSIS — D3132 Benign neoplasm of left choroid: Secondary | ICD-10-CM | POA: Diagnosis not present

## 2014-09-06 DIAGNOSIS — F4323 Adjustment disorder with mixed anxiety and depressed mood: Secondary | ICD-10-CM | POA: Diagnosis not present

## 2014-09-06 DIAGNOSIS — H25011 Cortical age-related cataract, right eye: Secondary | ICD-10-CM | POA: Diagnosis not present

## 2014-09-10 ENCOUNTER — Encounter (HOSPITAL_COMMUNITY): Payer: Self-pay | Admitting: *Deleted

## 2014-09-10 ENCOUNTER — Inpatient Hospital Stay (HOSPITAL_COMMUNITY)
Admission: RE | Admit: 2014-09-10 | Discharge: 2014-09-13 | DRG: 470 | Disposition: A | Discharge status: Home Health | Payer: Medicare Other | Source: Ambulatory Visit | Attending: Orthopaedic Surgery | Admitting: Orthopaedic Surgery

## 2014-09-10 ENCOUNTER — Inpatient Hospital Stay (HOSPITAL_COMMUNITY): Payer: Medicare Other

## 2014-09-10 ENCOUNTER — Inpatient Hospital Stay (HOSPITAL_COMMUNITY): Payer: Medicare Other | Admitting: Anesthesiology

## 2014-09-10 ENCOUNTER — Encounter (HOSPITAL_COMMUNITY)
Admission: RE | Disposition: A | Discharge status: Home Health | Payer: Self-pay | Source: Ambulatory Visit | Attending: Orthopaedic Surgery

## 2014-09-10 DIAGNOSIS — Z01812 Encounter for preprocedural laboratory examination: Secondary | ICD-10-CM

## 2014-09-10 DIAGNOSIS — Z808 Family history of malignant neoplasm of other organs or systems: Secondary | ICD-10-CM

## 2014-09-10 DIAGNOSIS — Z87891 Personal history of nicotine dependence: Secondary | ICD-10-CM

## 2014-09-10 DIAGNOSIS — Z886 Allergy status to analgesic agent status: Secondary | ICD-10-CM | POA: Diagnosis not present

## 2014-09-10 DIAGNOSIS — M1611 Unilateral primary osteoarthritis, right hip: Secondary | ICD-10-CM | POA: Diagnosis not present

## 2014-09-10 DIAGNOSIS — E039 Hypothyroidism, unspecified: Secondary | ICD-10-CM | POA: Diagnosis present

## 2014-09-10 DIAGNOSIS — Z880 Allergy status to penicillin: Secondary | ICD-10-CM | POA: Diagnosis not present

## 2014-09-10 DIAGNOSIS — Z807 Family history of other malignant neoplasms of lymphoid, hematopoietic and related tissues: Secondary | ICD-10-CM | POA: Diagnosis not present

## 2014-09-10 DIAGNOSIS — Z888 Allergy status to other drugs, medicaments and biological substances status: Secondary | ICD-10-CM | POA: Diagnosis not present

## 2014-09-10 DIAGNOSIS — Z885 Allergy status to narcotic agent status: Secondary | ICD-10-CM | POA: Diagnosis not present

## 2014-09-10 DIAGNOSIS — Z8249 Family history of ischemic heart disease and other diseases of the circulatory system: Secondary | ICD-10-CM

## 2014-09-10 DIAGNOSIS — D62 Acute posthemorrhagic anemia: Secondary | ICD-10-CM | POA: Diagnosis not present

## 2014-09-10 DIAGNOSIS — I10 Essential (primary) hypertension: Secondary | ICD-10-CM | POA: Diagnosis present

## 2014-09-10 DIAGNOSIS — M419 Scoliosis, unspecified: Secondary | ICD-10-CM | POA: Diagnosis not present

## 2014-09-10 DIAGNOSIS — M25451 Effusion, right hip: Secondary | ICD-10-CM | POA: Diagnosis present

## 2014-09-10 DIAGNOSIS — Z471 Aftercare following joint replacement surgery: Secondary | ICD-10-CM | POA: Diagnosis not present

## 2014-09-10 DIAGNOSIS — E780 Pure hypercholesterolemia: Secondary | ICD-10-CM | POA: Diagnosis present

## 2014-09-10 DIAGNOSIS — Z96641 Presence of right artificial hip joint: Secondary | ICD-10-CM

## 2014-09-10 DIAGNOSIS — M25551 Pain in right hip: Secondary | ICD-10-CM | POA: Diagnosis not present

## 2014-09-10 DIAGNOSIS — Z419 Encounter for procedure for purposes other than remedying health state, unspecified: Secondary | ICD-10-CM

## 2014-09-10 DIAGNOSIS — Z800 Family history of malignant neoplasm of digestive organs: Secondary | ICD-10-CM

## 2014-09-10 DIAGNOSIS — Z853 Personal history of malignant neoplasm of breast: Secondary | ICD-10-CM

## 2014-09-10 HISTORY — PX: TOTAL HIP ARTHROPLASTY: SHX124

## 2014-09-10 LAB — ABO/RH: ABO/RH(D): A NEG

## 2014-09-10 LAB — TYPE AND SCREEN
ABO/RH(D): A NEG
ANTIBODY SCREEN: NEGATIVE

## 2014-09-10 LAB — PROTIME-INR
INR: 0.94 (ref 0.00–1.49)
Prothrombin Time: 12.7 seconds (ref 11.6–15.2)

## 2014-09-10 LAB — APTT: APTT: 24 s (ref 24–37)

## 2014-09-10 SURGERY — ARTHROPLASTY, HIP, TOTAL, ANTERIOR APPROACH
Anesthesia: General | Laterality: Right

## 2014-09-10 MED ORDER — HYDROMORPHONE HCL 1 MG/ML IJ SOLN
0.2500 mg | INTRAMUSCULAR | Status: DC | PRN
  Administered 2014-09-10 (×2): 0.5 mg via INTRAVENOUS

## 2014-09-10 MED ORDER — METOCLOPRAMIDE HCL 5 MG/ML IJ SOLN: 5.0000 mg | Freq: Three times a day (TID) | INTRAMUSCULAR | Status: DC | PRN

## 2014-09-10 MED ORDER — PROMETHAZINE HCL 25 MG/ML IJ SOLN
INTRAMUSCULAR | Status: AC
Start: 2014-09-10 — End: 2014-09-10
  Administered 2014-09-10: 6.25 mg
  Filled 2014-09-10: qty 1

## 2014-09-10 MED ORDER — PROPOFOL 10 MG/ML IV BOLUS
INTRAVENOUS | Status: AC
  Filled 2014-09-10: qty 20

## 2014-09-10 MED ORDER — PHENOL 1.4 % MT LIQD
1.0000 | OROMUCOSAL | Status: DC | PRN
  Filled 2014-09-10: qty 177

## 2014-09-10 MED ORDER — ASPIRIN EC 325 MG PO TBEC
325.0000 mg | DELAYED_RELEASE_TABLET | Freq: Every day | ORAL | Status: DC
  Administered 2014-09-11 – 2014-09-13 (×3): 325 mg via ORAL
  Filled 2014-09-10 (×3): qty 1

## 2014-09-10 MED ORDER — ONDANSETRON HCL 4 MG/2ML IJ SOLN: 4.0000 mg | Freq: Four times a day (QID) | INTRAMUSCULAR | Status: DC | PRN

## 2014-09-10 MED ORDER — FENTANYL CITRATE (PF) 100 MCG/2ML IJ SOLN
INTRAMUSCULAR | Status: AC
  Filled 2014-09-10: qty 2

## 2014-09-10 MED ORDER — PHENYLEPHRINE 40 MCG/ML (10ML) SYRINGE FOR IV PUSH (FOR BLOOD PRESSURE SUPPORT)
PREFILLED_SYRINGE | INTRAVENOUS | Status: AC
  Filled 2014-09-10: qty 20

## 2014-09-10 MED ORDER — SODIUM CHLORIDE 0.9 % IR SOLN
Status: DC | PRN
  Administered 2014-09-10: 1000 mL

## 2014-09-10 MED ORDER — TRANEXAMIC ACID 1000 MG/10ML IV SOLN
1000.0000 mg | INTRAVENOUS | Status: AC
  Administered 2014-09-10: 1000 mg via INTRAVENOUS
  Filled 2014-09-10: qty 10

## 2014-09-10 MED ORDER — SUCCINYLCHOLINE CHLORIDE 20 MG/ML IJ SOLN
INTRAMUSCULAR | Status: DC | PRN
  Administered 2014-09-10: 60 mg via INTRAVENOUS

## 2014-09-10 MED ORDER — PANTOPRAZOLE SODIUM 40 MG PO TBEC
80.0000 mg | DELAYED_RELEASE_TABLET | Freq: Every day | ORAL | Status: DC
  Administered 2014-09-11 – 2014-09-12 (×2): 80 mg via ORAL
  Filled 2014-09-10 (×4): qty 2

## 2014-09-10 MED ORDER — CLINDAMYCIN PHOSPHATE 900 MG/50ML IV SOLN
INTRAVENOUS | Status: AC
  Filled 2014-09-10: qty 50

## 2014-09-10 MED ORDER — NEOSTIGMINE METHYLSULFATE 10 MG/10ML IV SOLN
INTRAVENOUS | Status: DC | PRN
  Administered 2014-09-10: 3 mg via INTRAVENOUS

## 2014-09-10 MED ORDER — LACTATED RINGERS IV SOLN: INTRAVENOUS | Status: DC

## 2014-09-10 MED ORDER — METOCLOPRAMIDE HCL 10 MG PO TABS: 5.0000 mg | ORAL_TABLET | Freq: Three times a day (TID) | ORAL | Status: DC | PRN

## 2014-09-10 MED ORDER — ONDANSETRON HCL 4 MG PO TABS: 4.0000 mg | ORAL_TABLET | Freq: Four times a day (QID) | ORAL | Status: DC | PRN

## 2014-09-10 MED ORDER — METHOCARBAMOL 1000 MG/10ML IJ SOLN
500.0000 mg | Freq: Four times a day (QID) | INTRAVENOUS | Status: DC | PRN
  Administered 2014-09-10: 500 mg via INTRAVENOUS
  Filled 2014-09-10 (×2): qty 5

## 2014-09-10 MED ORDER — LACTATED RINGERS IV SOLN
INTRAVENOUS | Status: DC | PRN
  Administered 2014-09-10: 10:00:00 via INTRAVENOUS

## 2014-09-10 MED ORDER — LORAZEPAM 0.5 MG PO TABS
0.5000 mg | ORAL_TABLET | Freq: Every day | ORAL | Status: DC | PRN
  Administered 2014-09-10 – 2014-09-12 (×3): 0.5 mg via ORAL
  Filled 2014-09-10 (×3): qty 1

## 2014-09-10 MED ORDER — PROPOFOL 10 MG/ML IV BOLUS
INTRAVENOUS | Status: DC | PRN
  Administered 2014-09-10: 80 mg via INTRAVENOUS

## 2014-09-10 MED ORDER — GLYCOPYRROLATE 0.2 MG/ML IJ SOLN
INTRAMUSCULAR | Status: DC | PRN
  Administered 2014-09-10: 0.4 mg via INTRAVENOUS

## 2014-09-10 MED ORDER — LIDOCAINE HCL (CARDIAC) 20 MG/ML IV SOLN
INTRAVENOUS | Status: DC | PRN
Start: 2014-09-10 — End: 2014-09-10
  Administered 2014-09-10: 50 mg via INTRAVENOUS

## 2014-09-10 MED ORDER — SODIUM CHLORIDE 0.9 % IV SOLN
INTRAVENOUS | Status: DC
  Administered 2014-09-10 – 2014-09-11 (×2): via INTRAVENOUS

## 2014-09-10 MED ORDER — OXYCODONE HCL 5 MG PO TABS
5.0000 mg | ORAL_TABLET | ORAL | Status: DC | PRN
  Administered 2014-09-10 – 2014-09-13 (×14): 10 mg via ORAL
  Filled 2014-09-10 (×15): qty 2

## 2014-09-10 MED ORDER — TAMOXIFEN CITRATE 10 MG PO TABS
20.0000 mg | ORAL_TABLET | Freq: Every morning | ORAL | Status: DC
  Administered 2014-09-11 – 2014-09-13 (×3): 20 mg via ORAL
  Filled 2014-09-10 (×5): qty 2

## 2014-09-10 MED ORDER — MENTHOL 3 MG MT LOZG: 1.0000 | LOZENGE | OROMUCOSAL | Status: DC | PRN

## 2014-09-10 MED ORDER — EPHEDRINE SULFATE 50 MG/ML IJ SOLN
INTRAMUSCULAR | Status: DC | PRN
  Administered 2014-09-10: 10 mg via INTRAVENOUS

## 2014-09-10 MED ORDER — ONDANSETRON HCL 4 MG/2ML IJ SOLN
INTRAMUSCULAR | Status: DC | PRN
  Administered 2014-09-10: 4 mg via INTRAVENOUS

## 2014-09-10 MED ORDER — FENTANYL CITRATE (PF) 100 MCG/2ML IJ SOLN
INTRAMUSCULAR | Status: DC | PRN
  Administered 2014-09-10 (×3): 50 ug via INTRAVENOUS
  Administered 2014-09-10: 100 ug via INTRAVENOUS

## 2014-09-10 MED ORDER — LEVOTHYROXINE SODIUM 100 MCG PO TABS
100.0000 ug | ORAL_TABLET | Freq: Every day | ORAL | Status: DC
  Administered 2014-09-11 – 2014-09-13 (×3): 100 ug via ORAL
  Filled 2014-09-10 (×4): qty 1

## 2014-09-10 MED ORDER — HYDROMORPHONE HCL 1 MG/ML IJ SOLN
INTRAMUSCULAR | Status: AC
  Filled 2014-09-10: qty 1

## 2014-09-10 MED ORDER — ONDANSETRON HCL 4 MG/2ML IJ SOLN
INTRAMUSCULAR | Status: AC
  Filled 2014-09-10: qty 2

## 2014-09-10 MED ORDER — PHENYLEPHRINE HCL 10 MG/ML IJ SOLN
INTRAMUSCULAR | Status: AC
  Filled 2014-09-10: qty 2

## 2014-09-10 MED ORDER — METHOCARBAMOL 500 MG PO TABS
500.0000 mg | ORAL_TABLET | Freq: Four times a day (QID) | ORAL | Status: DC | PRN
  Administered 2014-09-10 – 2014-09-12 (×3): 500 mg via ORAL
  Filled 2014-09-10 (×3): qty 1

## 2014-09-10 MED ORDER — DIPHENHYDRAMINE HCL 12.5 MG/5ML PO ELIX: 12.5000 mg | ORAL_SOLUTION | ORAL | Status: DC | PRN

## 2014-09-10 MED ORDER — DOCUSATE SODIUM 100 MG PO CAPS
100.0000 mg | ORAL_CAPSULE | Freq: Two times a day (BID) | ORAL | Status: DC
  Administered 2014-09-10 – 2014-09-13 (×6): 100 mg via ORAL

## 2014-09-10 MED ORDER — ZOLPIDEM TARTRATE 5 MG PO TABS: 5.0000 mg | ORAL_TABLET | Freq: Every evening | ORAL | Status: DC | PRN

## 2014-09-10 MED ORDER — ROCURONIUM BROMIDE 100 MG/10ML IV SOLN
INTRAVENOUS | Status: DC | PRN
  Administered 2014-09-10: 30 mg via INTRAVENOUS

## 2014-09-10 MED ORDER — ACETAMINOPHEN 325 MG PO TABS
650.0000 mg | ORAL_TABLET | Freq: Four times a day (QID) | ORAL | Status: DC | PRN
  Administered 2014-09-11 – 2014-09-12 (×2): 650 mg via ORAL
  Filled 2014-09-10 (×2): qty 2

## 2014-09-10 MED ORDER — ALUM & MAG HYDROXIDE-SIMETH 200-200-20 MG/5ML PO SUSP: 30.0000 mL | ORAL | Status: DC | PRN

## 2014-09-10 MED ORDER — LACTATED RINGERS IV SOLN
INTRAVENOUS | Status: DC
  Administered 2014-09-10: 1000 mL via INTRAVENOUS

## 2014-09-10 MED ORDER — CLINDAMYCIN PHOSPHATE 600 MG/50ML IV SOLN
600.0000 mg | Freq: Four times a day (QID) | INTRAVENOUS | Status: AC
  Administered 2014-09-10 (×2): 600 mg via INTRAVENOUS
  Filled 2014-09-10 (×2): qty 50

## 2014-09-10 MED ORDER — TRAMADOL HCL 50 MG PO TABS: 100.0000 mg | ORAL_TABLET | Freq: Four times a day (QID) | ORAL | Status: DC | PRN

## 2014-09-10 MED ORDER — ACETAMINOPHEN 650 MG RE SUPP: 650.0000 mg | Freq: Four times a day (QID) | RECTAL | Status: DC | PRN

## 2014-09-10 MED ORDER — CLINDAMYCIN PHOSPHATE 900 MG/50ML IV SOLN
900.0000 mg | INTRAVENOUS | Status: AC
  Administered 2014-09-10: 900 mg via INTRAVENOUS

## 2014-09-10 SURGICAL SUPPLY — 41 items
BAG ZIPLOCK 12X15 (MISCELLANEOUS) IMPLANT
BENZOIN TINCTURE PRP APPL 2/3 (GAUZE/BANDAGES/DRESSINGS) ×3 IMPLANT
BLADE SAW SGTL 18X1.27X75 (BLADE) ×2 IMPLANT
BLADE SAW SGTL 18X1.27X75MM (BLADE) ×1
CAPT HIP TOTAL 2 ×3 IMPLANT
CELLS DAT CNTRL 66122 CELL SVR (MISCELLANEOUS) ×1 IMPLANT
CLOSURE WOUND 1/2 X4 (GAUZE/BANDAGES/DRESSINGS) ×1
COVER PERINEAL POST (MISCELLANEOUS) ×3 IMPLANT
DRAPE C-ARM 42X120 X-RAY (DRAPES) ×3 IMPLANT
DRAPE STERI IOBAN 125X83 (DRAPES) ×3 IMPLANT
DRAPE U-SHAPE 47X51 STRL (DRAPES) ×9 IMPLANT
DRSG AQUACEL AG ADV 3.5X10 (GAUZE/BANDAGES/DRESSINGS) ×3 IMPLANT
DURAPREP 26ML APPLICATOR (WOUND CARE) ×3 IMPLANT
ELECT BLADE TIP CTD 4 INCH (ELECTRODE) ×3 IMPLANT
ELECT REM PT RETURN 9FT ADLT (ELECTROSURGICAL) ×3
ELECTRODE REM PT RTRN 9FT ADLT (ELECTROSURGICAL) ×1 IMPLANT
FACESHIELD WRAPAROUND (MASK) ×12 IMPLANT
GAUZE XEROFORM 1X8 LF (GAUZE/BANDAGES/DRESSINGS) IMPLANT
GLOVE BIO SURGEON STRL SZ7.5 (GLOVE) ×3 IMPLANT
GLOVE BIOGEL PI IND STRL 8 (GLOVE) ×2 IMPLANT
GLOVE BIOGEL PI INDICATOR 8 (GLOVE) ×4
GLOVE ECLIPSE 8.0 STRL XLNG CF (GLOVE) ×3 IMPLANT
GOWN STRL REUS W/TWL XL LVL3 (GOWN DISPOSABLE) ×6 IMPLANT
HANDPIECE INTERPULSE COAX TIP (DISPOSABLE) ×2
KIT BASIN OR (CUSTOM PROCEDURE TRAY) ×3 IMPLANT
PACK TOTAL JOINT (CUSTOM PROCEDURE TRAY) ×3 IMPLANT
PEN SKIN MARKING BROAD (MISCELLANEOUS) ×3 IMPLANT
RTRCTR WOUND ALEXIS 18CM MED (MISCELLANEOUS) ×3
SET HNDPC FAN SPRY TIP SCT (DISPOSABLE) ×1 IMPLANT
STAPLER VISISTAT 35W (STAPLE) IMPLANT
STRIP CLOSURE SKIN 1/2X4 (GAUZE/BANDAGES/DRESSINGS) ×2 IMPLANT
SUT ETHIBOND NAB CT1 #1 30IN (SUTURE) ×3 IMPLANT
SUT MNCRL AB 4-0 PS2 18 (SUTURE) ×3 IMPLANT
SUT VIC AB 0 CT1 36 (SUTURE) ×3 IMPLANT
SUT VIC AB 1 CT1 36 (SUTURE) ×3 IMPLANT
SUT VIC AB 2-0 CT1 27 (SUTURE) ×4
SUT VIC AB 2-0 CT1 TAPERPNT 27 (SUTURE) ×2 IMPLANT
TOWEL OR 17X26 10 PK STRL BLUE (TOWEL DISPOSABLE) ×3 IMPLANT
TOWEL OR NON WOVEN STRL DISP B (DISPOSABLE) ×3 IMPLANT
TRAY FOLEY W/METER SILVER 14FR (SET/KITS/TRAYS/PACK) ×3 IMPLANT
YANKAUER SUCT BULB TIP 10FT TU (MISCELLANEOUS) ×3 IMPLANT

## 2014-09-10 NOTE — Anesthesia Procedure Notes (Signed)
Procedure Name: Intubation Performed by: Bandon Sherwin J Pre-anesthesia Checklist: Patient identified, Emergency Drugs available, Suction available, Patient being monitored and Timeout performed Patient Re-evaluated:Patient Re-evaluated prior to inductionOxygen Delivery Method: Circle system utilized Preoxygenation: Pre-oxygenation with 100% oxygen Intubation Type: IV induction Ventilation: Mask ventilation without difficulty Laryngoscope Size: Mac and 3 Grade View: Grade I Tube type: Oral Tube size: 7.0 mm Number of attempts: 1 Airway Equipment and Method: Stylet Placement Confirmation: ETT inserted through vocal cords under direct vision,  positive ETCO2,  CO2 detector and breath sounds checked- equal and bilateral Secured at: 21 cm Tube secured with: Tape Dental Injury: Teeth and Oropharynx as per pre-operative assessment        

## 2014-09-10 NOTE — Progress Notes (Signed)
Utilization review completed.  

## 2014-09-10 NOTE — Anesthesia Preprocedure Evaluation (Addendum)
Anesthesia Evaluation  Patient identified by MRN, date of birth, ID band Patient awake    Reviewed: Allergy & Precautions, H&P , NPO status , Patient's Chart, lab work & pertinent test results  Airway Mallampati: II  TM Distance: >3 FB Neck ROM: full    Dental  (+) Dental Advisory Given, Edentulous Upper, Caps All lower front are capped:   Pulmonary neg pulmonary ROS, former smoker,  breath sounds clear to auscultation  Pulmonary exam normal       Cardiovascular Exercise Tolerance: Good hypertension, Normal cardiovascular examRhythm:regular Rate:Normal  palpitations   Neuro/Psych DDD lumbar negative neurological ROS  negative psych ROS   GI/Hepatic negative GI ROS, Neg liver ROS,   Endo/Other  negative endocrine ROSHypothyroidism   Renal/GU negative Renal ROS  negative genitourinary   Musculoskeletal scoliosis   Abdominal   Peds  Hematology negative hematology ROS (+)   Anesthesia Other Findings   Reproductive/Obstetrics negative OB ROS                            Anesthesia Physical Anesthesia Plan  ASA: II  Anesthesia Plan: General   Post-op Pain Management:    Induction: Intravenous  Airway Management Planned: Oral ETT  Additional Equipment:   Intra-op Plan:   Post-operative Plan: Extubation in OR  Informed Consent: I have reviewed the patients History and Physical, chart, labs and discussed the procedure including the risks, benefits and alternatives for the proposed anesthesia with the patient or authorized representative who has indicated his/her understanding and acceptance.   Dental Advisory Given  Plan Discussed with: CRNA and Surgeon  Anesthesia Plan Comments:        Anesthesia Quick Evaluation

## 2014-09-10 NOTE — Brief Op Note (Signed)
09/10/2014  11:09 AM  PATIENT:  [name]  81 y.o. female  PRE-OPERATIVE DIAGNOSIS:  primary osteoarthritis right hip  POST-OPERATIVE DIAGNOSIS:  primary osteoarthritis right hip  PROCEDURE:  Procedure(s): RIGHT TOTAL HIP ARTHROPLASTY ANTERIOR APPROACH (Right)  SURGEON:  Surgeon(s) and Role:    * Mcarthur Rossetti, MD - Primary  PHYSICIAN ASSISTANT: Benita Stabile, PA-C  ANESTHESIA:   general  EBL:  Total I/O In: -  Out: 200 [Blood:200]  BLOOD ADMINISTERED:none  DRAINS: none   LOCAL MEDICATIONS USED:  NONE  SPECIMEN:  No Specimen  DISPOSITION OF SPECIMEN:  N/A  COUNTS:  YES  TOURNIQUET:  * No tourniquets in log *  DICTATION: .Other Dictation: Dictation Number [id number]  PLAN OF CARE: Admit to inpatient   PATIENT DISPOSITION:  PACU - hemodynamically stable.   Delay start of Pharmacological VTE agent (>24hrs) due to surgical blood loss or risk of bleeding: no

## 2014-09-10 NOTE — Anesthesia Postprocedure Evaluation (Signed)
  Anesthesia Post-op Note  Patient: Alice [name]  Procedure(s) Performed: Procedure(s) (LRB): RIGHT TOTAL HIP ARTHROPLASTY ANTERIOR APPROACH (Right)  Patient Location: PACU  Anesthesia Type: General  Level of Consciousness: awake and alert   Airway and Oxygen Therapy: Patient Spontanous Breathing  Post-op [name]: mild  Post-op Assessment: Post-op Vital signs reviewed, Patient's Cardiovascular Status Stable, Respiratory Function Stable, Patent Airway and No signs of Nausea or vomiting  Last Vitals:  Filed Vitals:   09/10/14 1215  BP: 132/36  Pulse: 63  Temp:   Resp: 15    Post-op Vital Signs: stable   Complications: No apparent anesthesia complications

## 2014-09-10 NOTE — H&P (Signed)
TOTAL HIP ADMISSION H&P  Patient is admitted for right total hip arthroplasty.  Subjective:  Chief Complaint: right hip [name]  HPI: Alice [name], 81 y.o. female, has a history of [name] and functional disability in the right hip(s) due to arthritis and patient has failed non-surgical conservative treatments for greater than 12 weeks to include NSAID's and/or analgesics, corticosteriod injections, flexibility and strengthening excercises, supervised PT with diminished ADL's post treatment and activity modification.  Onset of symptoms was gradual starting 3 years ago with gradually worsening course since that time.The patient noted no past surgery on the right hip(s).  Patient currently rates [name] in the right hip at 9 out of 10 with activity. Patient has worsening of [name] with activity and weight bearing, [name] that interfers with activities of daily living and [name] with passive range of motion. Patient has evidence of subchondral sclerosis, periarticular osteophytes and joint space narrowing by imaging studies. This condition presents safety issues increasing the risk of falls.  There is no current active infection.  Patient Active Problem List   Diagnosis Date Noted  . Osteoarthritis of right hip 09/10/2014  . Breast cancer of upper-outer quadrant of right female breast 01/07/2013  . Arthralgia 12/08/2012  . Anxiety 02/12/2012  . Cat bite of finger 08/10/2011  . Cellulitis 08/10/2011  . Leukocytosis 08/10/2011   Past Medical History  Diagnosis Date  . Hypothyroidism   . Breast cancer      Right , chemo, radiation  . Scoliosis     L4 L5  . History of diverticulitis of colon   . Alopecia   . HTN (hypertension)   . Hypercholesteremia   . Seasonal allergies   . DDD (degenerative disc disease)   . SUI (stress urinary incontinence, female)   . Heart palpitations   . Insect bites   . History of diverticulitis     Past Surgical History  Procedure Laterality Date  . Breast lumpectomy   2012    right  . Tonsillectomy  2001    Diverticulitis  . Hernia repair      RIH, Dr. Rise Patience  . Port-a-cath removal    . Cataract extraction      left  . Bladder suspension    . Colon surgery  20 YRS AGO    BOWEL RESECTION    No prescriptions prior to admission   Allergies  Allergen Reactions  . Amoxicillin Rash  . Diphenhydramine Rash    "It is the red dye in Benadryl that I''m allergic to"  . Naproxen Sodium Rash    "It is the blue dye in naproxen that I'm allergic to."  . Lipitor [Atorvastatin]   . Codeine Other (See Comments)    unknown  . Skelaxin [Metaxalone] Other (See Comments)    Gastric upset.   Marland Kitchen Hctz [Hydrochlorothiazide] Itching    History  Substance Use Topics  . Smoking status: Former Smoker    Quit date: 08/09/1964  . Smokeless tobacco: Never Used  . Alcohol Use: Yes     Comment: RARE    Family History  Problem Relation Age of Onset  . Heart disease Mother   . Heart disease Father   . Cancer Sister     lymphoma  . Skin cancer Brother     melanoma  . Colon cancer       Review of Systems  Musculoskeletal: Positive for back [name] and joint [name].  All other systems reviewed and are negative.   Objective:  Physical Exam  Constitutional: She is oriented to person, place, and time. She appears well-developed and well-nourished.  HENT:  Head: Normocephalic and atraumatic.  Eyes: EOM are normal. Pupils are equal, round, and reactive to light.  Neck: Normal range of motion. Neck supple.  Cardiovascular: Normal rate and regular rhythm.   Respiratory: Effort normal and breath sounds normal.  GI: Soft. Bowel sounds are normal.  Musculoskeletal:       Right hip: She exhibits decreased range of motion, decreased strength, tenderness and bony tenderness.  Neurological: She is alert and oriented to person, place, and time.  Skin: Skin is warm and dry.  Psychiatric: She has a normal mood and affect.    Vital signs in last 24 hours:     Labs:   Estimated body mass index is 24.76 kg/(m^2) as calculated from the following:   Height as of 05/25/14: 5\' 1"  (1.549 m).   Weight as of 05/25/14: 59.421 kg (131 lb).   Imaging Review Plain radiographs demonstrate severe degenerative joint disease of the right hip(s). The bone quality appears to be good for age and reported activity level.  Assessment/Plan:  End stage arthritis, right hip(s)  The patient history, physical examination, clinical judgement of the provider and imaging studies are consistent with end stage degenerative joint disease of the right hip(s) and total hip arthroplasty is deemed medically necessary. The treatment options including medical management, injection therapy, arthroscopy and arthroplasty were discussed at length. The risks and benefits of total hip arthroplasty were presented and reviewed. The risks due to aseptic loosening, infection, stiffness, dislocation/subluxation,  thromboembolic complications and other imponderables were discussed.  The patient acknowledged the explanation, agreed to proceed with the plan and consent was signed. Patient is being admitted for inpatient treatment for surgery, [name] control, PT, OT, prophylactic antibiotics, VTE prophylaxis, progressive ambulation and ADL's and discharge planning.The patient is planning to be discharged home with home health services

## 2014-09-10 NOTE — Transfer of Care (Signed)
Immediate Anesthesia Transfer of Care Note  Patient: Alice [name]  Procedure(s) Performed: Procedure(s): RIGHT TOTAL HIP ARTHROPLASTY ANTERIOR APPROACH (Right)  Patient Location: PACU  Anesthesia Type:General  Level of Consciousness: awake, alert  and oriented  Airway & Oxygen Therapy: Patient Spontanous Breathing and Patient connected to face mask oxygen  Post-op Assessment: Report given to RN and Post -op Vital signs reviewed and stable  Post vital signs: Reviewed and stable  Last Vitals:  Filed Vitals:   09/10/14 0728  BP: 185/68  Pulse: 79  Temp: 36.6 C  Resp: 20    Complications: No apparent anesthesia complications

## 2014-09-11 LAB — BASIC METABOLIC PANEL
ANION GAP: 5 (ref 5–15)
BUN: 15 mg/dL (ref 6–20)
CALCIUM: 8.3 mg/dL — AB (ref 8.9–10.3)
CO2: 31 mmol/L (ref 22–32)
CREATININE: 0.75 mg/dL (ref 0.44–1.00)
Chloride: 103 mmol/L (ref 101–111)
Glucose, Bld: 105 mg/dL — ABNORMAL HIGH (ref 65–99)
Potassium: 3.9 mmol/L (ref 3.5–5.1)
Sodium: 139 mmol/L (ref 135–145)

## 2014-09-11 LAB — CBC
HEMATOCRIT: 31.3 % — AB (ref 36.0–46.0)
Hemoglobin: 10.2 g/dL — ABNORMAL LOW (ref 12.0–15.0)
MCH: 30.8 pg (ref 26.0–34.0)
MCHC: 32.6 g/dL (ref 30.0–36.0)
MCV: 94.6 fL (ref 78.0–100.0)
Platelets: 130 10*3/uL — ABNORMAL LOW (ref 150–400)
RBC: 3.31 MIL/uL — ABNORMAL LOW (ref 3.87–5.11)
RDW: 14.1 % (ref 11.5–15.5)
WBC: 9.1 10*3/uL (ref 4.0–10.5)

## 2014-09-11 MED ORDER — LIP MEDEX EX OINT
TOPICAL_OINTMENT | CUTANEOUS | Status: AC
  Filled 2014-09-11: qty 7

## 2014-09-11 NOTE — Progress Notes (Signed)
OT Cancellation Note  Patient Details Name: Alice [name] MRN: [id number] DOB: 16-Jan-1931   Cancelled Treatment:    Reason Eval/Treat Not Completed: Fatigue/lethargy limiting ability to participate  OT role explained. Will see pt next day  Betsy Pries 09/11/2014, 3:14 PM

## 2014-09-11 NOTE — Progress Notes (Signed)
Subjective: 1 Day Post-Op Procedure(s) (LRB): RIGHT TOTAL HIP ARTHROPLASTY ANTERIOR APPROACH (Right) Patient reports pain as moderate.  Acute blood loss anemia, but tolerating well.   Objective: Vital signs in last 24 hours: Temp:  [97 F (36.1 C)-99.1 F (37.3 C)] 99 F (37.2 C) (07/16 0457) Pulse Rate:  [57-97] 76 (07/16 0457) Resp:  [14-16] 16 (07/16 0457) BP: (104-168)/(31-75) 116/42 mmHg (07/16 0457) SpO2:  [96 %-100 %] 100 % (07/16 0457)  Intake/Output from previous day: 07/15 0701 - 07/16 0700 In: 1200 [P.O.:600; I.V.:550; IV Piggyback:50] Out: 0160 [Urine:1450; Blood:200] Intake/Output this shift:     Recent Labs  09/11/14 0440  HGB 10.2*    Recent Labs  09/11/14 0440  WBC 9.1  RBC 3.31*  HCT 31.3*  PLT 130*    Recent Labs  09/11/14 0440  NA 139  K 3.9  CL 103  CO2 31  BUN 15  CREATININE 0.75  GLUCOSE 105*  CALCIUM 8.3*    Recent Labs  09/10/14 0757  INR 0.94    Sensation intact distally Intact pulses distally Dorsiflexion/Plantar flexion intact Incision: scant drainage  Assessment/Plan: 1 Day Post-Op Procedure(s) (LRB): RIGHT TOTAL HIP ARTHROPLASTY ANTERIOR APPROACH (Right) Up with therapy  Everli Rother Y 09/11/2014, 8:33 AM

## 2014-09-11 NOTE — Progress Notes (Signed)
Physical Therapy Treatment Patient Details Name: Alice [name] MRN: [id number] DOB: 05/08/1930 Today's Date: 09/11/2014    History of Present Illness R THR    PT Comments    Pt progressing slowly with mobility - ltd by c/o nausea/dizziness with activity  Follow Up Recommendations  Home health PT     Equipment Recommendations  Rolling walker with 5" wheels    Recommendations for Other Services OT consult     Precautions / Restrictions Precautions Precautions: Fall Restrictions Weight Bearing Restrictions: No Other Position/Activity Restrictions: WBAT    Mobility  Bed Mobility Overal bed mobility: Needs Assistance Bed Mobility: Sit to Supine       Sit to supine: Mod assist   General bed mobility comments: INcreased time, cues for sequence and use of L LE to self assist, physical assist to manage Bil LE and to control trunk to upright  Transfers Overall transfer level: Needs assistance Equipment used: Rolling walker (2 wheeled) Transfers: Sit to/from Stand Sit to Stand: Mod assist         General transfer comment: cues for LE management and use of UEs to self assist  Ambulation/Gait Ambulation/Gait assistance: Min assist;+2 physical assistance;+2 safety/equipment Ambulation Distance (Feet): 20 Feet Assistive device: Rolling walker (2 wheeled) Gait Pattern/deviations: Step-to pattern;Decreased step length - right;Decreased step length - left;Shuffle;Trunk flexed Gait velocity: decr Gait velocity interpretation: Below normal speed for age/gender General Gait Details: cues for posture, sequence and position from RW - ltd by c/o dizziness/nausea   Stairs            Wheelchair Mobility    Modified Rankin (Stroke Patients Only)       Balance Overall balance assessment: Needs assistance Sitting-balance support: No upper extremity supported Sitting balance-Leahy Scale: Fair     Standing balance support: Bilateral upper extremity  supported Standing balance-Leahy Scale: Poor                      Cognition Arousal/Alertness: Awake/alert Behavior During Therapy: WFL for tasks assessed/performed Overall Cognitive Status: Within Functional Limits for tasks assessed                      Exercises      General Comments        Pertinent Vitals/Pain Pain Assessment: 0-10 Pain Score: 4  Pain Location: R hip Pain Descriptors / Indicators: Aching;Sore Pain Intervention(s): Limited activity within patient's tolerance;Monitored during session;Premedicated before session;Ice applied    Home Living                      Prior Function            PT Goals (current goals can now be found in the care plan section) Acute Rehab PT Goals Patient Stated Goal: Home PT Goal Formulation: With patient Time For Goal Achievement: 09/15/14 Potential to Achieve Goals: Good Progress towards PT goals: Progressing toward goals    Frequency  7X/week    PT Plan Current plan remains appropriate    Co-evaluation             End of Session Equipment Utilized During Treatment: Gait belt Activity Tolerance: Patient limited by fatigue;No increased pain;Other (comment) (intermmitent nausea) Patient left: in bed;with call bell/phone within reach;with family/visitor present     Time: 6270-3500 PT Time Calculation (min) (ACUTE ONLY): 23 min  Charges:  $Gait Training: 23-37 mins  G Codes:      Rashelle Ireland 23-Sep-2014, 5:27 PM

## 2014-09-11 NOTE — Op Note (Signed)
Alice [name], Alice [name]               ACCOUNT NO.:  [id number]  MEDICAL RECORD NO.:  [id number]  LOCATION:  7564                         FACILITY:  [facility]  PHYSICIAN:  Lind Guest. Ninfa Linden, M.D.DATE OF BIRTH:  04-20-1930  DATE OF PROCEDURE:  09/10/2014 DATE OF DISCHARGE:                              OPERATIVE REPORT   PREOPERATIVE DIAGNOSES:  Primary osteoarthritis and degenerative joint disease, right hip.  POSTOPERATIVE DIAGNOSES:  Primary osteoarthritis and degenerative joint disease, right hip.  PROCEDURE:  Right total hip arthroplasty through direct anterior approach.  IMPLANTS:  DePuy Sector Gription acetabular component size 52 with a single screw, size 36+ 0 neutral polyethylene liner, size 10 Corail femoral component with standard offset, size 36+ 1.5 ceramic hip ball.  SURGEON:  Lind Guest. Ninfa Linden, M.D.  ASSISTANT:  Erskine Emery, PA-C  ANESTHESIA:  General.  ANTIBIOTICS:  900 mg of IV clindamycin.  BLOOD LOSS:  200 mL.  COMPLICATIONS:  None.  INDICATIONS:  Alice [name] is a very pleasant 81 year old female, well known to me.  I have seen her for many years now and she has developed debilitating arthritis, involving her right hip.  She has tried all forms of conservative treatment and is not needing any assistive device. She has worked on activity modification.  She is a very thin individual. She has tried anti-inflammatories, which now caused some kidney issues and blood pressure.  She has tried intra-articular injections and they have not helped.  She has daily pain, decreased mobility and decreased quality of life due to her pain.  Her x-rays show sclerotic changes in the femoral head and joint space narrowing, and her exam is also consistent with osteoarthritis.  She used to get good relief from her injections.  At this point, we recommended a total hip arthroplasty through direct anterior approach.  We have explained the risks and benefits in detail  of surgery, including the risk of acute blood loss anemia, nerve and vessel injury, fracture, infection, dislocation, and DVT.  She understands our goals are decreased pain, improved mobility, and overall improved quality of life.  PROCEDURE DESCRIPTION:  After informed consent was obtained, appropriate right hip was marked.  She was brought to the operating room.  General anesthesia was obtained while she was on her stretcher.  A Foley catheter was placed and then both feet had traction boots applied to them.  Next, she was placed supine on the Hana fracture table with the perineal post in place and both legs in inline skeletal traction devices but no traction applied.  Her right operative hip was prepped and draped with DuraPrep and sterile drapes.  A time-out was called and she was identified as correct patient, correct right hip.  I then made an incision inferior and posterior to the anterior superior iliac spine and carried this obliquely down the leg.  I dissected down the tensor fascia lata muscle.  The tensor fascia was then divided longitudinally, so we could proceed with a direct anterior approach to the hip.  We identified and cauterized the lateral femoral circumflex vessels and then identified her hip capsule.  We placed a Cobra retractor around the lateral femoral neck and up underneath the rectus  femoris around the medial femoral neck.  We then opened up the hip capsule in an L-type format, finding a very large joint effusion, which were irrigated out. We placed the Cobra retractors within the hip capsule and then made our femoral neck cut with an oscillating saw proximal to the lesser trochanter and completed this with an osteotome.  I placed a corkscrew guide in the femoral head and removed the femoral head in its entirety and found a very wide area devoid of cartilage, flattened, hardened, and shiny like marble due to her severe osteoarthritis.  We cleaned  the acetabular remnants from the acetabular labrum and other debris.  We placed a bent Hohmann over the medial acetabular rim and then began reaming under direct visualization from a size 42 in 2 mm increments up to a size 52.  Last 2 reamers were also placed under direct fluoroscopy. With that being said, then we chose the real 52 DePuy Sector Gription acetabular component and placed this in the acetabulum without complications, this was done under visualization and fluoroscopy.  I was pleased about the depth of placement, my inclination and anteversion.  I then placed a single screw and the real 36+ 0 polyethylene liner. Attention was then turned to the femur.  With the leg externally rotated to 100 degrees, extended and adducted, we were able to place a Mueller retractor medially and a Hohmann retractor behind the greater trochanter.  I released the lateral joint capsule and used a box cutting osteotome to enter the femoral canal and a rongeur to lateralize.  We then began broaching from a size 8 broach up to a size 10 broach.  With the size 10, which was a nice tight fit, we trialed a standard offset femoral neck trial and a 36+ 1.5 hip ball and brought the leg back over and up.  With traction and internal rotation, we reduced the hip and we were pleased with the alignment and offset, her leg lengths, as well as stability with range of motion.  We then dislocated the hip and removed the trial components.  We placed the real Corail femoral component size 10 with standard offset and the real 36+ 1.5 ceramic hip ball and again reduced the pelvis.  We were pleased with stability.  We then irrigated the soft tissue with normal saline solution using pulsatile lavage.  We closed the joint capsule with interrupted #1 Ethibond suture, followed by running #1 Vicryl on the tensor fascia, 0 Vicryl in the deep tissue, 2-0 Vicryl in the subcutaneous tissue, 4-0 Monocryl subcuticular stitch and  Steri-Strips on the skin.  An Aquacel dressing was applied.  She was taken off the Hana table, awakened, extubated, and taken to the recovery room in stable condition.  All final counts were correct.  There were no complications noted.  Of note, Erskine Emery, PA-C assisted during the entire case.  His assistance was crucial for facilitating all aspects of this case.     Lind Guest. Ninfa Linden, M.D.     CYB/MEDQ  D:  09/10/2014  T:  09/11/2014  Job:  [id number]

## 2014-09-11 NOTE — Evaluation (Signed)
Physical Therapy Evaluation Patient Details Name: AJANAE VIRAG MRN: [id number] DOB: Oct 17, 1930 Today's Date: 09/11/2014   History of Present Illness  R THR  Clinical Impression  Pt s/p R THR presents with decreased R LE strength/ROM and post op pain limiting functional mobility.  Pt should progress to dc home with pre-arranged 24/7 assist and HHPT follow up.    Follow Up Recommendations Home health PT    Equipment Recommendations  Rolling walker with 5" wheels    Recommendations for Other Services OT consult     Precautions / Restrictions Precautions Precautions: Fall Restrictions Weight Bearing Restrictions: No Other Position/Activity Restrictions: WBAT      Mobility  Bed Mobility Overal bed mobility: Needs Assistance Bed Mobility: Supine to Sit     Supine to sit: Mod assist     General bed mobility comments: INcreased time, cues for sequence and use of L LE to self assist, physical assist to manage R LE and to bring trunk to upright  Transfers Overall transfer level: Needs assistance Equipment used: Rolling walker (2 wheeled) Transfers: Sit to/from Stand Sit to Stand: Mod assist         General transfer comment: cues for LE management and use of UEs to self assist  Ambulation/Gait Ambulation/Gait assistance: Mod assist;+2 safety/equipment Ambulation Distance (Feet): 4 Feet Assistive device: Rolling walker (2 wheeled) Gait Pattern/deviations: Step-to pattern;Decreased step length - right;Decreased step length - left;Shuffle;Trunk flexed     General Gait Details: cues for posture, sequence and position from RW - ltd by c/o dizziness - BP 108/46  Stairs            Wheelchair Mobility    Modified Rankin (Stroke Patients Only)       Balance                                             Pertinent Vitals/Pain Pain Assessment: 0-10 Pain Score: 4  Pain Location: R hip Pain Descriptors / Indicators: Aching;Sore Pain  Intervention(s): Limited activity within patient's tolerance;Monitored during session;Premedicated before session;Ice applied    Home Living Family/patient expects to be discharged to:: Private residence Living Arrangements: Alone Available Help at Discharge: Friend(s) Type of Home: House Home Access: Stairs to enter   Technical brewer of Steps: 1 Home Layout: One level Home Equipment: None Additional Comments: Pt states has arranged 24/7 assist    Prior Function Level of Independence: Independent               Hand Dominance        Extremity/Trunk Assessment   Upper Extremity Assessment: Overall WFL for tasks assessed           Lower Extremity Assessment: RLE deficits/detail RLE Deficits / Details: 2+/5 hip strength with AAROM at hip to 85 flex and 10 abd    Cervical / Trunk Assessment: Normal  Communication   Communication: No difficulties  Cognition Arousal/Alertness: Awake/alert Behavior During Therapy: WFL for tasks assessed/performed Overall Cognitive Status: Within Functional Limits for tasks assessed                      General Comments      Exercises Total Joint Exercises Ankle Circles/Pumps: AROM;Both;15 reps;Supine Quad Sets: AROM;Both;10 reps;Supine Heel Slides: AAROM;Right;15 reps;Supine Hip ABduction/ADduction: AAROM;Right;10 reps;Supine      Assessment/Plan    PT Assessment Patient needs continued PT services  PT Diagnosis Difficulty walking   PT Problem List Decreased strength;Decreased range of motion;Decreased activity tolerance;Decreased mobility;Decreased knowledge of use of DME;Pain  PT Treatment Interventions DME instruction;Gait training;Stair training;Therapeutic exercise;Functional mobility training;Therapeutic activities;Patient/family education   PT Goals (Current goals can be found in the Care Plan section) Acute Rehab PT Goals Patient Stated Goal: Home PT Goal Formulation: With patient Time For Goal  Achievement: 09/15/14 Potential to Achieve Goals: Good    Frequency 7X/week   Barriers to discharge        Co-evaluation               End of Session Equipment Utilized During Treatment: Gait belt Activity Tolerance: Patient limited by fatigue Patient left: in chair;with call bell/phone within reach Nurse Communication: Mobility status         Time: 3254-9826 PT Time Calculation (min) (ACUTE ONLY): 35 min   Charges:   PT Evaluation $Initial PT Evaluation Tier I: 1 Procedure PT Treatments $Therapeutic Exercise: 8-22 mins   PT G Codes:        Khori Underberg 09/19/14, 12:39 PM

## 2014-09-11 NOTE — Discharge Instructions (Signed)

## 2014-09-11 NOTE — Care Management Note (Signed)
Case Management Note  Patient Details  Name: Alice [name] MRN: [id number] Date of Birth: 28-Sep-1930  Subjective/Objective:                  RIGHT TOTAL HIP ARTHROPLASTY  Action/Plan: Discharge planning  Expected Discharge Date:                  Expected Discharge Plan:  [place]  In-House Referral:     Discharge planning Services  CM Consult  Post Acute Care Choice:  Home Health Choice offered to:     DME Arranged:  3-N-1, Walker rolling DME Agency:  [place]:  PT [place]:  [place]  Status of Service:  Completed, signed off  Medicare Important Message Given:    Date Medicare IM Given:    Medicare IM give by:    Date Additional Medicare IM Given:    Additional Medicare Important Message give by:     If discussed at [place] of Stay Meetings, dates discussed:    Additional Comments:  CM spoke with patient at the bedside. [name] was set-up pre-operatively. Patient is agreeable. Needs a RW and 3N1. [name] at [facility] notified of DME request.   [name], RN 09/11/2014, 11:32 AM

## 2014-09-12 LAB — CBC
HCT: 34.7 % — ABNORMAL LOW (ref 36.0–46.0)
Hemoglobin: 11.3 g/dL — ABNORMAL LOW (ref 12.0–15.0)
MCH: 30.8 pg (ref 26.0–34.0)
MCHC: 32.6 g/dL (ref 30.0–36.0)
MCV: 94.6 fL (ref 78.0–100.0)
Platelets: 126 10*3/uL — ABNORMAL LOW (ref 150–400)
RBC: 3.67 MIL/uL — ABNORMAL LOW (ref 3.87–5.11)
RDW: 13.9 % (ref 11.5–15.5)
WBC: 11.7 10*3/uL — ABNORMAL HIGH (ref 4.0–10.5)

## 2014-09-12 NOTE — Progress Notes (Signed)
Pt hesitant to allow staff to turn and reposition her although encouraged q 2hrs w/a and has beem medicated for pain with relief.Aggie Moats D

## 2014-09-12 NOTE — Progress Notes (Signed)
Subjective: Pt stable but having more than usual post op pain   Objective: Vital signs in last 24 hours: Temp:  [97.2 F (36.2 C)-101 F (38.3 C)] 101 F (38.3 C) (07/17 0500) Pulse Rate:  [79-89] 89 (07/17 0500) Resp:  [16-20] 20 (07/17 0500) BP: (108-147)/(46-88) 147/49 mmHg (07/17 0500) SpO2:  [90 %-100 %] 90 % (07/17 0500)  Intake/Output from previous day: 07/16 0701 - 07/17 0700 In: 1320 [P.O.:720; I.V.:600] Out: 1800 [Urine:1800] Intake/Output this shift: Total I/O In: 240 [P.O.:240] Out: -   Exam:  Dorsiflexion/Plantar flexion intact  Labs:  Recent Labs  09/11/14 0440 09/12/14 0500  HGB 10.2* 11.3*    Recent Labs  09/11/14 0440 09/12/14 0500  WBC 9.1 11.7*  RBC 3.31* 3.67*  HCT 31.3* 34.7*  PLT 130* 126*    Recent Labs  09/11/14 0440  NA 139  K 3.9  CL 103  CO2 31  BUN 15  CREATININE 0.75  GLUCOSE 105*  CALCIUM 8.3*    Recent Labs  09/10/14 0757  INR 0.94    Assessment/Plan: Post op xrays ok - mobilize with PT today - recheck am for possible dc if more mobilei   Alice [name] 09/12/2014, 10:03 AM

## 2014-09-12 NOTE — Progress Notes (Signed)
   09/12/14 1600  PT Visit Information  Last PT Received On 09/12/14  Assistance Needed +1  History of Present Illness R THR  PT Time Calculation  PT Start Time (ACUTE ONLY) 1448  PT Stop Time (ACUTE ONLY) 1503  PT Time Calculation (min) (ACUTE ONLY) 15 min  Subjective Data  Patient Stated Goal Home  Precautions  Precautions Fall  Restrictions  Other Position/Activity Restrictions WBAT  Pain Assessment  Pain Assessment 0-10  Pain Score 2  Pain Location R hip  Pain Descriptors / Indicators Sore  Pain Intervention(s) Limited activity within patient's tolerance;Monitored during session;Premedicated before session;Repositioned;Ice applied  Cognition  Arousal/Alertness Awake/alert  Behavior During Therapy WFL for tasks assessed/performed  Overall Cognitive Status Within Functional Limits for tasks assessed  General Comments  General comments (skin integrity, edema, etc.) pt reports she has ben up to bathroom a few times and is tired, amb deferred, pt agreeable to exercises  Total Joint Exercises  Ankle Circles/Pumps AROM;Both;15 reps;Supine  Quad Sets AROM;Both;10 reps;Supine  Heel Slides AAROM;Right;15 reps;Supine  Hip ABduction/ADduction AAROM;Right;10 reps;Supine  Short Arc Quad AROM;Right;10 reps  PT - End of Session  Activity Tolerance Patient tolerated treatment well  Patient left in bed;with call bell/phone within reach;with family/visitor present  PT - Assessment/Plan  PT Plan Current plan remains appropriate  PT Frequency (ACUTE ONLY) 7X/week  Follow Up Recommendations Home health PT  PT equipment Rolling walker with 5" wheels  PT Goal Progression  Progress towards PT goals Progressing toward goals  Acute Rehab PT Goals  PT Goal Formulation With patient  Time For Goal Achievement 09/15/14  Potential to Achieve Goals Good  PT General Charges  $$ ACUTE PT VISIT 1 Procedure  PT Treatments  $Therapeutic Exercise 8-22 mins

## 2014-09-12 NOTE — Evaluation (Signed)
Occupational Therapy Evaluation Patient Details Name: Alice [name] MRN: [id number] DOB: 05/02/1930 Today's Date: 09/12/2014    History of Present Illness R THR   Clinical Impression   Pt is s/p THA resulting in the deficits listed below (see OT Problem List).  Pt will benefit from skilled OT to increase their safety and independence with ADL and functional mobility for ADL to facilitate discharge to venue listed below.        Follow Up Recommendations  Home health OT    Equipment Recommendations  3 in 1 bedside comode;Other (comment)    Recommendations for Other Services       Precautions / Restrictions Precautions Precautions: Fall Restrictions Weight Bearing Restrictions: No Other Position/Activity Restrictions: WBAT      Mobility Bed Mobility Overal bed mobility: Needs Assistance Bed Mobility: Sit to Supine     Supine to sit: Mod assist        Transfers Overall transfer level: Needs assistance Equipment used: Rolling walker (2 wheeled) Transfers: Sit to/from Stand Sit to Stand: Mod assist         General transfer comment: cues for LE management and use of UEs to self assist         ADL Overall ADL's : Needs assistance/impaired     Grooming: Standing;Minimal assistance   Upper Body Bathing: Set up;Sitting   Lower Body Bathing: Maximal assistance;Sit to/from stand   Upper Body Dressing : Set up;Sitting   Lower Body Dressing: Maximal assistance;Sit to/from stand   Toilet Transfer: Moderate assistance;Ambulation;RW   Toileting- Clothing Manipulation and Hygiene: Moderate assistance;Sit to/from stand;Cueing for sequencing                         Pertinent Vitals/Pain Pain Score: 3  Pain Location: r hip Pain Descriptors / Indicators: Sore Pain Intervention(s): Limited activity within patient's tolerance;Repositioned     Hand Dominance     Extremity/Trunk Assessment Upper Extremity Assessment Upper Extremity Assessment:  Overall WFL for tasks assessed           Communication Communication Communication: No difficulties   Cognition Arousal/Alertness: Awake/alert Behavior During Therapy: WFL for tasks assessed/performed Overall Cognitive Status: Within Functional Limits for tasks assessed                                Home Living Family/patient expects to be discharged to:: Private residence Living Arrangements: Alone Available Help at Discharge: Friend(s) Type of Home: House Home Access: Stairs to enter Technical brewer of Steps: 1   Home Layout: One level     Bathroom Shower/Tub: Teacher, early years/pre: Standard     Home Equipment: None   Additional Comments: Pt states has arranged 24/7 assist      Prior Functioning/Environment Level of Independence: Independent             OT Diagnosis: Generalized weakness   OT Problem List: Decreased strength;Decreased activity tolerance   OT Treatment/Interventions: Self-care/ADL training;DME and/or AE instruction;Patient/family education    OT Goals(Current goals can be found in the care plan section) Acute Rehab OT Goals Patient Stated Goal: Home OT Goal Formulation: With patient Time For Goal Achievement: 09/26/14 Potential to Achieve Goals: Good  OT Frequency: Min 2X/week   Barriers to D/C:            Co-evaluation              End  of Session Nurse Communication: Mobility status  Activity Tolerance: Patient tolerated treatment well Patient left: in chair;with call bell/phone within reach   Time: 0945-1030 OT Time Calculation (min): 45 min Charges:  OT General Charges $OT Visit: 1 Procedure OT Evaluation $Initial OT Evaluation Tier I: 1 Procedure OT Treatments $Self Care/Home Management : 23-37 mins G-Codes:    Payton Mccallum D 2014/09/26, 10:36 AM

## 2014-09-12 NOTE — Progress Notes (Signed)
Physical Therapy Treatment Patient Details Name: Alice [name] MRN: [id number] DOB: [birth year] Today's Date: 13-Sep-2014    History of Present Illness R THR    PT Comments    Progressing well; feeling better overall today, still mild nausea  Follow Up Recommendations  Home health PT     Equipment Recommendations  Rolling walker with 5" wheels    Recommendations for Other Services       Precautions / Restrictions Precautions Precautions: Fall Restrictions Weight Bearing Restrictions: No Other Position/Activity Restrictions: WBAT    Mobility  Bed Mobility Overal bed mobility: Needs Assistance Bed Mobility: Sit to Supine     Supine to sit: Mod assist     General bed mobility comments: on EOB with OT upon arrival  Transfers Overall transfer level: Needs assistance Equipment used: Rolling walker (2 wheeled) Transfers: Sit to/from Stand Sit to Stand: Min guard         General transfer comment: cues for LE management and use of UEs to self assist  Ambulation/Gait Ambulation/Gait assistance: Min guard Ambulation Distance (Feet): 60 Feet Assistive device: Rolling walker (2 wheeled) Gait Pattern/deviations: Step-to pattern;Antalgic Gait velocity: decr   General Gait Details: cues for posture, sequence and position from RW - mild/nausea   Stairs            Wheelchair Mobility    Modified Rankin (Stroke Patients Only)       Balance                                    Cognition Arousal/Alertness: Awake/alert Behavior During Therapy: WFL for tasks assessed/performed Overall Cognitive Status: Within Functional Limits for tasks assessed                      Exercises      General Comments        Pertinent Vitals/Pain Pain Assessment: 0-10 Pain Score: 3  Pain Location: R hip Pain Descriptors / Indicators: Aching Pain Intervention(s): Limited activity within patient's tolerance;Monitored during  session;Repositioned;Ice applied    Home Living Family/patient expects to be discharged to:: Private residence Living Arrangements: Alone Available Help at Discharge: Friend(s) Type of Home: House Home Access: Stairs to enter   Home Layout: One level Home Equipment: None Additional Comments: Pt states has arranged 24/7 assist    Prior Function Level of Independence: Independent          PT Goals (current goals can now be found in the care plan section) Acute Rehab PT Goals Patient Stated Goal: Home PT Goal Formulation: With patient Time For Goal Achievement: 09/15/14 Potential to Achieve Goals: Good Progress towards PT goals: Progressing toward goals    Frequency  7X/week    PT Plan Current plan remains appropriate    Co-evaluation             End of Session Equipment Utilized During Treatment: Gait belt Activity Tolerance: Patient tolerated treatment well Patient left: in chair;with call bell/phone within reach;with family/visitor present     Time: 1032-1050 PT Time Calculation (min) (ACUTE ONLY): 18 min  Charges:  $Gait Training: 8-22 mins                    G Codes:      Kailly Richoux 2014/09/13, 11:14 AM

## 2014-09-13 ENCOUNTER — Encounter (HOSPITAL_COMMUNITY): Payer: Self-pay | Admitting: Orthopaedic Surgery

## 2014-09-13 LAB — CBC
HEMATOCRIT: 29.4 % — AB (ref 36.0–46.0)
HEMOGLOBIN: 9.5 g/dL — AB (ref 12.0–15.0)
MCH: 30.4 pg (ref 26.0–34.0)
MCHC: 32.3 g/dL (ref 30.0–36.0)
MCV: 94.2 fL (ref 78.0–100.0)
Platelets: 83 10*3/uL — ABNORMAL LOW (ref 150–400)
RBC: 3.12 MIL/uL — ABNORMAL LOW (ref 3.87–5.11)
RDW: 13.9 % (ref 11.5–15.5)
WBC: 7.7 10*3/uL (ref 4.0–10.5)

## 2014-09-13 MED ORDER — TRAMADOL HCL 50 MG PO TABS: 100.0000 mg | ORAL_TABLET | Freq: Four times a day (QID) | ORAL | Status: DC | PRN

## 2014-09-13 MED ORDER — METHOCARBAMOL 500 MG PO TABS: 500.0000 mg | ORAL_TABLET | Freq: Four times a day (QID) | ORAL | Status: DC | PRN

## 2014-09-13 MED ORDER — ASPIRIN 325 MG PO TBEC: 325.0000 mg | DELAYED_RELEASE_TABLET | Freq: Every day | ORAL | Status: DC

## 2014-09-13 NOTE — Care Management Important Message (Signed)
Important Message  Patient Details  Name: JAEL WALDORF MRN: [id number] Date of Birth: August 22, 1930   Medicare Important Message Given:  Yes-second notification given    Camillo Flaming 09/13/2014, 12:27 [place] Message  Patient Details  Name: SKYLAH DELAUTER MRN: [id number] Date of Birth: [birth year]   Medicare Important Message Given:  Yes-second notification given    Camillo Flaming 09/13/2014, 12:27 PM

## 2014-09-13 NOTE — Progress Notes (Signed)
Occupational Therapy Treatment Patient Details Name: Alice [name] MRN: [id number] DOB: [birth year] Today's Date: 09/13/2014    History of present illness R THR   OT comments  Caregiver present. Pt ready to DC from OT standpoint  Follow Up Recommendations  Home health OT    Equipment Recommendations  3 in 1 bedside comode    Recommendations for Other Services      Precautions / Restrictions Precautions Precautions: Fall Restrictions Weight Bearing Restrictions: No       Mobility Bed Mobility Overal bed mobility: Needs Assistance Bed Mobility: Supine to Sit     Supine to sit: Min assist        Transfers Overall transfer level: Needs assistance Equipment used: Rolling walker (2 wheeled) Transfers: Sit to/from Stand Sit to Stand: Min guard         General transfer comment: caregiver present and education provided        ADL       Grooming: Standing;Supervision/safety               Lower Body Dressing: Minimal assistance;Sit to/from stand   Toilet Transfer: Supervision/safety;RW   Toileting- Water quality scientist and Hygiene: Supervision/safety;Sit to/from stand       Functional mobility during ADLs: Min guard;Cueing for safety;Cueing for sequencing General ADL Comments: defer tub transfer and DME decision to [facility] OT                Cognition   Behavior During Therapy: WFL for tasks assessed/performed Overall Cognitive Status: Within Functional Limits for tasks assessed                       Extremity/Trunk Assessment                          Pertinent Vitals/ Pain       Pain Score: 3  Pain Location: R hip Pain Descriptors / Indicators: Sore Pain Intervention(s): Repositioned;Limited activity within patient's tolerance;Monitored during session         Frequency Min 2X/week     Progress Toward Goals  OT Goals(current goals can now be found in the care plan section)   progressing     Plan Discharge plan  remains appropriate       End of Session     Activity Tolerance Patient tolerated treatment well   Patient Left in chair   Nurse Communication Mobility status        Time: 9935-7017 OT Time Calculation (min): 43 min  Charges: OT General Charges $OT Visit: 1 Procedure OT Treatments $Self Care/Home Management : 38-52 mins  Romonda Parker D 09/13/2014, 10:00 AM

## 2014-09-13 NOTE — Discharge Summary (Signed)
Patient ID: Alice [name] MRN: [id number] DOB/AGE: Aug 22, 1930 81 y.o.  Admit date: 09/10/2014 Discharge date: 09/13/2014  Admission Diagnoses:  Principal Problem:   Osteoarthritis of right hip Active Problems:   Status post total replacement of right hip   Discharge Diagnoses:  Same  Past Medical History  Diagnosis Date  . Hypothyroidism   . Breast cancer      Right , chemo, radiation  . Scoliosis     L4 L5  . History of diverticulitis of colon   . Alopecia   . HTN (hypertension)   . Hypercholesteremia   . Seasonal allergies   . DDD (degenerative disc disease)   . SUI (stress urinary incontinence, female)   . Heart palpitations   . Insect bites   . History of diverticulitis     Surgeries: Procedure(s): RIGHT TOTAL HIP ARTHROPLASTY ANTERIOR APPROACH on 09/10/2014   Consultants:    Discharged Condition: Improved  Hospital Course: Eddy BARBI KUMAGAI is an 81 y.o. female who was admitted 09/10/2014 for operative treatment ofOsteoarthritis of right hip. Patient has severe unremitting pain that affects sleep, daily activities, and work/hobbies. After pre-op clearance the patient was taken to the operating room on 09/10/2014 and underwent  Procedure(s): RIGHT TOTAL HIP ARTHROPLASTY ANTERIOR APPROACH.    Patient was given perioperative antibiotics: Anti-infectives    Start     Dose/Rate Route Frequency Ordered Stop   09/10/14 1600  clindamycin (CLEOCIN) IVPB 600 mg     600 mg 100 mL/hr over 30 Minutes Intravenous Every 6 hours 09/10/14 1320 09/10/14 2154   09/10/14 0727  clindamycin (CLEOCIN) IVPB 900 mg     900 mg 100 mL/hr over 30 Minutes Intravenous On call to O.R. 09/10/14 0727 09/10/14 1029       Patient was given sequential compression devices, early ambulation, and chemoprophylaxis to prevent DVT.  Patient benefited maximally from hospital stay and there were no complications.    Recent vital signs: Patient Vitals for the past 24 hrs:  BP Temp Temp src Pulse  Resp SpO2  09/13/14 0500 (!) 121/45 mmHg 98.6 F (37 C) Oral 71 20 93 %  09/12/14 2045 (!) 110/39 mmHg 98.5 F (36.9 C) Oral 86 20 94 %     Recent laboratory studies:  Recent Labs  09/11/14 0440 09/12/14 0500 09/13/14 0413  WBC 9.1 11.7* 7.7  HGB 10.2* 11.3* 9.5*  HCT 31.3* 34.7* 29.4*  PLT 130* 126* 83*  NA 139  --   --   K 3.9  --   --   CL 103  --   --   CO2 31  --   --   BUN 15  --   --   CREATININE 0.75  --   --   GLUCOSE 105*  --   --   CALCIUM 8.3*  --   --      Discharge Medications:     Medication List    STOP taking these medications        aspirin 81 MG tablet  Replaced by:  aspirin 325 MG EC tablet      TAKE these medications        aspirin 325 MG EC tablet  Take 1 tablet (325 mg total) by mouth daily.     fish oil-omega-3 fatty acids 1000 MG capsule  Take 2 g by mouth 2 (two) times daily.     hydrocortisone cream 1 %  Apply 1 application topically 2 (two) times daily as needed for itching.  LORazepam 0.5 MG tablet  Commonly known as:  ATIVAN  Take 1 tablet (0.5 mg total) by mouth daily as needed for anxiety.     methocarbamol 500 MG tablet  Commonly known as:  ROBAXIN  Take 1 tablet (500 mg total) by mouth every 6 (six) hours as needed for muscle spasms.     MULTIVITAMIN PO  Take 1 tablet by mouth at bedtime.     naproxen sodium 220 MG tablet  Commonly known as:  ANAPROX  Take 220 mg by mouth 2 (two) times daily.     neomycin-bacitracin-polymyxin ointment  Commonly known as:  NEOSPORIN  Apply 1 application topically daily as needed for wound care. apply to eye     omeprazole 40 MG capsule  Commonly known as:  PRILOSEC  Take 1 capsule (40 mg total) by mouth daily.     SYNTHROID 100 MCG tablet  Generic drug:  levothyroxine  Take 100 mcg by mouth daily before breakfast.     tamoxifen 20 MG tablet  Commonly known as:  NOLVADEX  TAKE 1 TABLET DAILY     traMADol 50 MG tablet  Commonly known as:  ULTRAM  Take 2 tablets (100 mg  total) by mouth every 6 (six) hours as needed for moderate pain.        Diagnostic Studies: Dg C-arm 1-60 Min-no Report  09/10/2014   CLINICAL DATA: SURGERY   C-ARM 1-60 MINUTES  Fluoroscopy was utilized by the requesting physician.  No radiographic  interpretation.    Dg Hip Unilat With Pelvis 1v Right  09/10/2014   CLINICAL DATA:  Intraoperative anterior approach right total hip joint replacement imaging  EXAM: DG C-ARM 1-60 MIN - NRPT [facility]; DG HIP (WITH OR WITHOUT PELVIS) 1V RIGHT  COMPARISON:  None.  FLUOROSCOPY TIME:  Fluoroscopy Time:  0 minutes, 24 seconds  Number of Acquired Images:  2  FINDINGS: There is a prosthetic right total hip joint in place. Radiographic positioning of the prosthetic components is good. The interface with the native bone is unremarkable.  IMPRESSION: Placement of a total right hip joint prosthesis without evidence of immediate postprocedure complication.   Electronically Signed   By: David  Martinique M.D.   On: 09/10/2014 12:02   Dg Hip Port Unilat With Pelvis 1v Right  09/10/2014   CLINICAL DATA:  Patient status post right hip arthroplasty.  EXAM: DG HIP (WITH OR WITHOUT PELVIS) 1V PORT RIGHT  COMPARISON:  Intraoperative examination 09/10/2014  FINDINGS: Patient status post total right hip arthroplasty. No evidence for acute osseous abnormality. Pelvic ring appears intact. Soft tissue gas overlying the right hip compatible with postoperative state.  IMPRESSION: Patient status post right hip arthroplasty.   Electronically Signed   By: Lovey Newcomer M.D.   On: 09/10/2014 13:00    Disposition: 01-Home or Self Care      Discharge Instructions    Discharge patient    Complete by:  As directed            Follow-up Information    Follow up with Mcarthur Rossetti, MD In 2 weeks.   Specialty:  Orthopedic Surgery   Contact information:   [place] [address] [phone]       Follow up with [facility].   Why:  Home Health  Physical Therapy   Contact information:   [address] [place] [address] [phone]       Follow up with Mcarthur Rossetti, MD In 2  weeks.   Specialty:  Orthopedic Surgery   Contact information:   [place] [address] [phone]        Signed: Mcarthur Rossetti 09/13/2014, 6:00 PM

## 2014-09-13 NOTE — Progress Notes (Signed)
Physical Therapy Treatment Patient Details Name: Alice [name] MRN: [id number] DOB: September 07, 1930 Today's Date: 09/13/2014    History of Present Illness R THR    PT Comments    POD # 3.  Adjusted walker then assisted with amb in hallway.  Practiced one step twice then performed all THR following HEP handout.  Instructed on proper tech and freq.  Instructed on use of ICE.  All questions addressed.  Pt ready for D/C to home.   Follow Up Recommendations  Home health PT     Equipment Recommendations  Rolling walker with 5" wheels    Recommendations for Other Services       Precautions / Restrictions Precautions Precautions: Fall Restrictions Weight Bearing Restrictions: No Other Position/Activity Restrictions: WBAT    Mobility  Bed Mobility Overal bed mobility: Needs Assistance Bed Mobility: Supine to Sit     Supine to sit: Min assist     General bed mobility comments: pt OOB in recliner  Transfers Overall transfer level: Needs assistance Equipment used: Rolling walker (2 wheeled) Transfers: Sit to/from Stand Sit to Stand: Supervision         General transfer comment: good safety cognition and tech  Ambulation/Gait Ambulation/Gait assistance: Supervision Ambulation Distance (Feet): 55 Feet Assistive device: Rolling walker (2 wheeled) Gait Pattern/deviations: Step-to pattern Gait velocity: decr   General Gait Details: increased time but steady   Stairs Stairs: Yes Stairs assistance: Min guard Stair Management: No rails;Step to pattern;Forwards;With walker Number of Stairs: 1 General stair comments: one step forward with walker performed twice.    Wheelchair Mobility    Modified Rankin (Stroke Patients Only)       Balance                                    Cognition Arousal/Alertness: Awake/alert Behavior During Therapy: WFL for tasks assessed/performed Overall Cognitive Status: Within Functional Limits for tasks assessed                      Exercises      General Comments        Pertinent Vitals/Pain Pain Assessment: 0-10 Pain Score: 3  Pain Location: R hip Pain Descriptors / Indicators: Tightness;Sore Pain Intervention(s): Repositioned;Ice applied    Home Living                      Prior Function            PT Goals (current goals can now be found in the care plan section) Progress towards PT goals: Progressing toward goals    Frequency  7X/week    PT Plan      Co-evaluation             End of Session Equipment Utilized During Treatment: Gait belt Activity Tolerance: Patient tolerated treatment well Patient left: in chair;with call bell/phone within reach;with family/visitor present     Time: 7035-0093 PT Time Calculation (min) (ACUTE ONLY): 40 min  Charges:  $Gait Training: 8-22 mins $Therapeutic Exercise: 8-22 mins $Therapeutic Activity: 8-22 mins                    G Codes:      Rica Koyanagi  PTA [facility]  Acute  Rehab Pager      [phone]

## 2014-09-13 NOTE — Progress Notes (Signed)
Subjective: 3 Days Post-Op Procedure(s) (LRB): RIGHT TOTAL HIP ARTHROPLASTY ANTERIOR APPROACH (Right) Patient reports pain as moderate.  Acute blood loss anemia, but asymptomatic and stable vitals.  Objective: Vital signs in last 24 hours: Temp:  [98.5 F (36.9 C)-98.6 F (37 C)] 98.6 F (37 C) (07/18 0500) Pulse Rate:  [71-86] 71 (07/18 0500) Resp:  [18-20] 20 (07/18 0500) BP: (110-121)/(39-51) 121/45 mmHg (07/18 0500) SpO2:  [93 %-94 %] 93 % (07/18 0500)  Intake/Output from previous day: 07/17 0701 - 07/18 0700 In: 840 [P.O.:840] Out: 1840 [Urine:1840] Intake/Output this shift:     Recent Labs  09/11/14 0440 09/12/14 0500 09/13/14 0413  HGB 10.2* 11.3* 9.5*    Recent Labs  09/12/14 0500 09/13/14 0413  WBC 11.7* 7.7  RBC 3.67* 3.12*  HCT 34.7* 29.4*  PLT 126* 83*    Recent Labs  09/11/14 0440  NA 139  K 3.9  CL 103  CO2 31  BUN 15  CREATININE 0.75  GLUCOSE 105*  CALCIUM 8.3*    Recent Labs  09/10/14 0757  INR 0.94    Sensation intact distally Intact pulses distally Dorsiflexion/Plantar flexion intact Incision: dressing C/D/I  Assessment/Plan: 3 Days Post-Op Procedure(s) (LRB): RIGHT TOTAL HIP ARTHROPLASTY ANTERIOR APPROACH (Right) Up with therapy Discharge home with home health today.  Sayed Apostol Y 09/13/2014, 7:45 AM

## 2014-09-14 DIAGNOSIS — M519 Unspecified thoracic, thoracolumbar and lumbosacral intervertebral disc disorder: Secondary | ICD-10-CM | POA: Diagnosis not present

## 2014-09-14 DIAGNOSIS — I10 Essential (primary) hypertension: Secondary | ICD-10-CM | POA: Diagnosis not present

## 2014-09-14 DIAGNOSIS — M4186 Other forms of scoliosis, lumbar region: Secondary | ICD-10-CM | POA: Diagnosis not present

## 2014-09-14 DIAGNOSIS — Z471 Aftercare following joint replacement surgery: Secondary | ICD-10-CM | POA: Diagnosis not present

## 2014-09-14 DIAGNOSIS — F419 Anxiety disorder, unspecified: Secondary | ICD-10-CM | POA: Diagnosis not present

## 2014-09-14 DIAGNOSIS — Z96641 Presence of right artificial hip joint: Secondary | ICD-10-CM | POA: Diagnosis not present

## 2014-09-15 DIAGNOSIS — I10 Essential (primary) hypertension: Secondary | ICD-10-CM | POA: Diagnosis not present

## 2014-09-15 DIAGNOSIS — Z96641 Presence of right artificial hip joint: Secondary | ICD-10-CM | POA: Diagnosis not present

## 2014-09-15 DIAGNOSIS — M4186 Other forms of scoliosis, lumbar region: Secondary | ICD-10-CM | POA: Diagnosis not present

## 2014-09-15 DIAGNOSIS — F419 Anxiety disorder, unspecified: Secondary | ICD-10-CM | POA: Diagnosis not present

## 2014-09-15 DIAGNOSIS — Z471 Aftercare following joint replacement surgery: Secondary | ICD-10-CM | POA: Diagnosis not present

## 2014-09-15 DIAGNOSIS — M519 Unspecified thoracic, thoracolumbar and lumbosacral intervertebral disc disorder: Secondary | ICD-10-CM | POA: Diagnosis not present

## 2014-09-17 DIAGNOSIS — I10 Essential (primary) hypertension: Secondary | ICD-10-CM | POA: Diagnosis not present

## 2014-09-17 DIAGNOSIS — M519 Unspecified thoracic, thoracolumbar and lumbosacral intervertebral disc disorder: Secondary | ICD-10-CM | POA: Diagnosis not present

## 2014-09-17 DIAGNOSIS — F419 Anxiety disorder, unspecified: Secondary | ICD-10-CM | POA: Diagnosis not present

## 2014-09-17 DIAGNOSIS — Z471 Aftercare following joint replacement surgery: Secondary | ICD-10-CM | POA: Diagnosis not present

## 2014-09-17 DIAGNOSIS — Z96641 Presence of right artificial hip joint: Secondary | ICD-10-CM | POA: Diagnosis not present

## 2014-09-17 DIAGNOSIS — M4186 Other forms of scoliosis, lumbar region: Secondary | ICD-10-CM | POA: Diagnosis not present

## 2014-09-20 DIAGNOSIS — Z96641 Presence of right artificial hip joint: Secondary | ICD-10-CM | POA: Diagnosis not present

## 2014-09-20 DIAGNOSIS — I10 Essential (primary) hypertension: Secondary | ICD-10-CM | POA: Diagnosis not present

## 2014-09-20 DIAGNOSIS — M519 Unspecified thoracic, thoracolumbar and lumbosacral intervertebral disc disorder: Secondary | ICD-10-CM | POA: Diagnosis not present

## 2014-09-20 DIAGNOSIS — F419 Anxiety disorder, unspecified: Secondary | ICD-10-CM | POA: Diagnosis not present

## 2014-09-20 DIAGNOSIS — M4186 Other forms of scoliosis, lumbar region: Secondary | ICD-10-CM | POA: Diagnosis not present

## 2014-09-20 DIAGNOSIS — Z471 Aftercare following joint replacement surgery: Secondary | ICD-10-CM | POA: Diagnosis not present

## 2014-09-22 DIAGNOSIS — Z471 Aftercare following joint replacement surgery: Secondary | ICD-10-CM | POA: Diagnosis not present

## 2014-09-22 DIAGNOSIS — M4186 Other forms of scoliosis, lumbar region: Secondary | ICD-10-CM | POA: Diagnosis not present

## 2014-09-22 DIAGNOSIS — F419 Anxiety disorder, unspecified: Secondary | ICD-10-CM | POA: Diagnosis not present

## 2014-09-22 DIAGNOSIS — Z96641 Presence of right artificial hip joint: Secondary | ICD-10-CM | POA: Diagnosis not present

## 2014-09-22 DIAGNOSIS — M519 Unspecified thoracic, thoracolumbar and lumbosacral intervertebral disc disorder: Secondary | ICD-10-CM | POA: Diagnosis not present

## 2014-09-22 DIAGNOSIS — I10 Essential (primary) hypertension: Secondary | ICD-10-CM | POA: Diagnosis not present

## 2014-09-24 DIAGNOSIS — M519 Unspecified thoracic, thoracolumbar and lumbosacral intervertebral disc disorder: Secondary | ICD-10-CM | POA: Diagnosis not present

## 2014-09-24 DIAGNOSIS — M4186 Other forms of scoliosis, lumbar region: Secondary | ICD-10-CM | POA: Diagnosis not present

## 2014-09-24 DIAGNOSIS — Z96641 Presence of right artificial hip joint: Secondary | ICD-10-CM | POA: Diagnosis not present

## 2014-09-24 DIAGNOSIS — I10 Essential (primary) hypertension: Secondary | ICD-10-CM | POA: Diagnosis not present

## 2014-09-24 DIAGNOSIS — Z471 Aftercare following joint replacement surgery: Secondary | ICD-10-CM | POA: Diagnosis not present

## 2014-09-24 DIAGNOSIS — F419 Anxiety disorder, unspecified: Secondary | ICD-10-CM | POA: Diagnosis not present

## 2014-09-27 DIAGNOSIS — M1611 Unilateral primary osteoarthritis, right hip: Secondary | ICD-10-CM | POA: Diagnosis not present

## 2014-09-27 DIAGNOSIS — M25551 Pain in right hip: Secondary | ICD-10-CM | POA: Diagnosis not present

## 2014-10-18 DIAGNOSIS — F4323 Adjustment disorder with mixed anxiety and depressed mood: Secondary | ICD-10-CM | POA: Diagnosis not present

## 2014-10-21 LAB — BASIC METABOLIC PANEL
BUN: 24 mg/dL — AB (ref 4–21)
CREATININE: 0.7 mg/dL (ref 0.5–1.1)
Glucose: 91 mg/dL
POTASSIUM: 4.3 mmol/L (ref 3.4–5.3)
SODIUM: 141 mmol/L (ref 137–147)

## 2014-10-21 LAB — LIPID PANEL
Cholesterol: 230 mg/dL — AB (ref 0–200)
HDL: 73 mg/dL — AB (ref 35–70)
LDL Cholesterol: 146 mg/dL
Triglycerides: 53 mg/dL (ref 40–160)

## 2014-10-21 LAB — HEPATIC FUNCTION PANEL
ALT: 23 U/L (ref 7–35)
AST: 18 U/L (ref 13–35)
Alkaline Phosphatase: 28 U/L (ref 25–125)
Bilirubin, Total: 0.4 mg/dL

## 2014-10-21 LAB — CBC AND DIFFERENTIAL
HCT: 42 % (ref 36–46)
Hemoglobin: 13.7 g/dL (ref 12.0–16.0)
Platelets: 211 10*3/uL (ref 150–399)
WBC: 5.4 10^3/mL

## 2014-10-21 LAB — TSH: TSH: 0.34 u[IU]/mL — AB (ref 0.41–5.90)

## 2014-11-26 ENCOUNTER — Other Ambulatory Visit: Payer: Self-pay | Admitting: *Deleted

## 2014-11-26 DIAGNOSIS — C50419 Malignant neoplasm of upper-outer quadrant of unspecified female breast: Secondary | ICD-10-CM

## 2014-11-30 ENCOUNTER — Other Ambulatory Visit: Payer: Self-pay | Admitting: *Deleted

## 2014-11-30 DIAGNOSIS — C50419 Malignant neoplasm of upper-outer quadrant of unspecified female breast: Secondary | ICD-10-CM

## 2014-11-30 MED ORDER — LORAZEPAM 0.5 MG PO TABS: 0.5000 mg | ORAL_TABLET | Freq: Every day | ORAL | Status: DC | PRN

## 2014-11-30 NOTE — Telephone Encounter (Signed)
Signed prescription faxed to [facility] at [phone]. Original script sent to HIM to be scanned into patient's chart.

## 2015-01-24 DIAGNOSIS — F4323 Adjustment disorder with mixed anxiety and depressed mood: Secondary | ICD-10-CM | POA: Diagnosis not present

## 2015-02-10 DIAGNOSIS — J0100 Acute maxillary sinusitis, unspecified: Secondary | ICD-10-CM | POA: Diagnosis not present

## 2015-02-16 DIAGNOSIS — R0609 Other forms of dyspnea: Secondary | ICD-10-CM | POA: Diagnosis not present

## 2015-02-16 DIAGNOSIS — J0100 Acute maxillary sinusitis, unspecified: Secondary | ICD-10-CM | POA: Diagnosis not present

## 2015-02-16 DIAGNOSIS — R42 Dizziness and giddiness: Secondary | ICD-10-CM | POA: Diagnosis not present

## 2015-03-12 ENCOUNTER — Ambulatory Visit (INDEPENDENT_AMBULATORY_CARE_PROVIDER_SITE_OTHER): Payer: Medicare Other

## 2015-03-12 ENCOUNTER — Ambulatory Visit (INDEPENDENT_AMBULATORY_CARE_PROVIDER_SITE_OTHER): Payer: Medicare Other | Admitting: Family Medicine

## 2015-03-12 VITALS — BP 162/90 | HR 82 | Temp 97.5°F | Resp 20

## 2015-03-12 DIAGNOSIS — H8112 Benign paroxysmal vertigo, left ear: Secondary | ICD-10-CM

## 2015-03-12 DIAGNOSIS — R0981 Nasal congestion: Secondary | ICD-10-CM | POA: Diagnosis not present

## 2015-03-12 DIAGNOSIS — R42 Dizziness and giddiness: Secondary | ICD-10-CM

## 2015-03-12 LAB — POCT CBC
GRANULOCYTE PERCENT: 62.3 % (ref 37–80)
HCT, POC: 41.7 % (ref 37.7–47.9)
Hemoglobin: 13.9 g/dL (ref 12.2–16.2)
Lymph, poc: 2.6 (ref 0.6–3.4)
MCH, POC: 28.6 pg (ref 27–31.2)
MCHC: 33.2 g/dL (ref 31.8–35.4)
MCV: 86.2 fL (ref 80–97)
MID (cbc): 0.3 (ref 0–0.9)
MPV: 7.1 fL (ref 0–99.8)
PLATELET COUNT, POC: 243 10*3/uL (ref 142–424)
POC Granulocyte: 4.8 (ref 2–6.9)
POC LYMPH PERCENT: 33.3 %L (ref 10–50)
POC MID %: 4.4 %M (ref 0–12)
RBC: 4.84 M/uL (ref 4.04–5.48)
RDW, POC: 16.2 %
WBC: 7.7 10*3/uL (ref 4.6–10.2)

## 2015-03-12 LAB — POCT SEDIMENTATION RATE: POCT SED RATE: 11 mm/hr (ref 0–22)

## 2015-03-12 MED ORDER — MECLIZINE HCL 25 MG PO TABS: 12.5000 mg | ORAL_TABLET | Freq: Three times a day (TID) | ORAL | Status: DC | PRN

## 2015-03-12 NOTE — Progress Notes (Addendum)
Subjective:    Patient ID: Alice [name], female    DOB: [birth year], 82 y.o.   MRN: [id number] By signing my name below, I, Judithe Modest, attest that this documentation has been prepared under the direction and in the presence of Delman Cheadle, MD. Electronically Signed: Judithe Modest, ER Scribe. 03/12/2015. 10:49 AM.  Chief Complaint  Patient presents with  . Dizziness  . Ear Fullness  . Sinus Problem    severe headache    HPI HPI Comments: Alice [name] is a 82 y.o. female who presents to [facility] complaining of dizziness ear fullness and postnasal drip. On the second of December she went out with a friend who had a cold and within two days she was in bed with a bad productive cough, sore throat and fatigue. She went to her PCP one week later when her sx had not improved. She was prescribed prednisone and omnisef. She developed dizziness which has persisted since that time. She is also complaining of ear fullness, and drainage going down her throat. She has severe vertigo sx when she gets up from being prone. She has been using saline nasal spray several times per day. She stopped taking the abx three weeks ago. She states she had a sinus HA yesterday. She has never taken meclizine before.   She has a service dog to encourage her to work out.   Past Medical History  Diagnosis Date  . Hypothyroidism   . Breast cancer ([place])      Right , chemo, radiation  . Scoliosis     L4 L5  . History of diverticulitis of colon   . Alopecia   . Hypercholesteremia   . Seasonal allergies   . DDD (degenerative disc disease)   . SUI (stress urinary incontinence, female)   . Heart palpitations   . Insect bites   . History of diverticulitis    Allergies  Allergen Reactions  . Amoxicillin Rash  . Diphenhydramine Rash    "It is the red dye in Benadryl that I''m allergic to"  . Naproxen Sodium Rash    "It is the blue dye in naproxen that I'm allergic to."  . Lipitor [Atorvastatin]   . Codeine  Other (See Comments)    unknown  . Skelaxin [Metaxalone] Other (See Comments)    Gastric upset.   Marland Kitchen Hctz [Hydrochlorothiazide] Itching   Current Outpatient Prescriptions on File Prior to Visit  Medication Sig Dispense Refill  . aspirin EC 325 MG EC tablet Take 1 tablet (325 mg total) by mouth daily. 30 tablet 0  . fish oil-omega-3 fatty acids 1000 MG capsule Take 2 g by mouth 2 (two) times daily.     . hydrocortisone cream 1 % Apply 1 application topically 2 (two) times daily as needed for itching.    . levothyroxine (SYNTHROID) 100 MCG tablet Take 100 mcg by mouth daily before breakfast.     . LORazepam (ATIVAN) 0.5 MG tablet Take 1 tablet (0.5 mg total) by mouth daily as needed for anxiety. 90 tablet 0  . Multiple Vitamin (MULTIVITAMIN PO) Take 1 tablet by mouth at bedtime.     . naproxen sodium (ANAPROX) 220 MG tablet Take 220 mg by mouth 2 (two) times daily. Reported on 03/12/2015    . omeprazole (PRILOSEC) 40 MG capsule Take 1 capsule (40 mg total) by mouth daily. 90 capsule 4  . tamoxifen (NOLVADEX) 20 MG tablet TAKE 1 TABLET DAILY 90 tablet 3  . methocarbamol (ROBAXIN)  500 MG tablet Take 1 tablet (500 mg total) by mouth every 6 (six) hours as needed for muscle spasms. (Patient not taking: Reported on 03/12/2015) 60 tablet 0  . neomycin-bacitracin-polymyxin (NEOSPORIN) ointment Apply 1 application topically daily as needed for wound care. Reported on 03/12/2015    . traMADol (ULTRAM) 50 MG tablet Take 2 tablets (100 mg total) by mouth every 6 (six) hours as needed for moderate [name]. (Patient not taking: Reported on 03/12/2015) 60 tablet 0   No current facility-administered medications on file prior to visit.    Review of Systems  Constitutional: Negative for fever and chills.  HENT: Positive for postnasal drip and sinus pressure.   Respiratory: Positive for cough (minimal). Negative for chest tightness and shortness of breath.   Neurological: Positive for dizziness and headaches.        Objective:  BP 162/90 mmHg  Pulse 82  Temp(Src) 97.5 F (36.4 C) (Oral)  Resp 20  SpO2 98%  Physical Exam  Constitutional: She is oriented to person, place, and time. She appears well-developed and well-nourished. No distress.  HENT:  Head: Normocephalic and atraumatic.  Erythma and mid ear effusion on right. Throat is red without exudates.   Eyes: Pupils are equal, round, and reactive to light.  Neck: Neck supple. No thyromegaly present.  Cardiovascular: Normal rate, regular rhythm and normal heart sounds.   No murmur heard. Pulmonary/Chest: Effort normal and breath sounds normal. No respiratory distress. She has no wheezes.  Musculoskeletal: Normal range of motion.  Neurological: She is alert and oriented to person, place, and time. Coordination normal.  Positive dicks Hallpike on left, but no nystagmus. Negative on right.  Skin: Skin is warm and dry. She is not diaphoretic.  Psychiatric: She has a normal mood and affect. Her behavior is normal.  Nursing note and vitals reviewed.  [facility] reading (PRIMARY) by  Dr. Brigitte Pulse. Appears to have right maxillary sinus opacification.      Results for orders placed or performed in visit on 03/12/15  POCT CBC  Result Value Ref Range   WBC 7.7 4.6 - 10.2 K/uL   Lymph, poc 2.6 0.6 - 3.4   POC LYMPH PERCENT 33.3 10 - 50 %L   MID (cbc) 0.3 0 - 0.9   POC MID % 4.4 0 - 12 %M   POC Granulocyte 4.8 2 - 6.9   Granulocyte percent 62.3 37 - 80 %G   RBC 4.84 4.04 - 5.48 M/uL   Hemoglobin 13.9 12.2 - 16.2 g/dL   HCT, POC 41.7 37.7 - 47.9 %   MCV 86.2 80 - 97 fL   MCH, POC 28.6 27 - 31.2 pg   MCHC 33.2 31.8 - 35.4 g/dL   RDW, POC 16.2 %   Platelet Count, POC 243 142 - 424 K/uL   MPV 7.1 0 - 99.8 fL  POCT SEDIMENTATION RATE  Result Value Ref Range   POCT SED RATE 11 0 - 22 mm/hr    Assessment & Plan:   1. Vertigo   2. BPPV (benign paroxysmal positional vertigo), left - by + Dix-Hallpike on left but failed epley manuvers at home so refer to  PT for vestibular rehab. In the meantime, try low dose of meclizine scheduled.  ADDENDUM: Called pt to see how she was feeling 1/16- 48 hours after OV. Unfortunately she was allergic to meclizine - caused worsening dizziness and blurred vision so I added to allergy list - so she is going to stay away from meds and  hopefully PT can work her in soon.  3. Sinus congestion - s/p omnicef and prednisone.  Pt with numerous med sensitivities so will Start netti pot and hold off on further treatment since cbc/esr reassuring.  If sxs worsen at all or sinus pressure cont, pt will call and I will call her in levaquin or bactrim    Orders Placed This Encounter  Procedures  . DG SinUS 1-2 Views    Standing Status: Future     Number of Occurrences: 1     Standing Expiration Date: 03/11/2016    Order Specific Question:  Reason for Exam (SYMPTOM  OR DIAGNOSIS REQUIRED)    Answer:  bilaterally maxillary [name] worsening after treatment for sinusitis, +vertigo    Order Specific Question:  Preferred imaging location?    Answer:  External  . Ambulatory referral to Physical Therapy    Referral Priority:  Urgent    Referral Type:  Physical Medicine    Referral Reason:  Specialty Services Required    Requested Specialty:  Physical Therapy    Number of Visits Requested:  1  . POCT CBC  . POCT SEDIMENTATION RATE    Meds ordered this encounter  Medications  . DISCONTD: cefdinir (OMNICEF) 300 MG capsule    Sig: Take 300 mg by mouth 2 (two) times daily.  Marland Kitchen DISCONTD: predniSONE (DELTASONE) 10 MG tablet    Sig: Take 10 mg by mouth daily with breakfast.  . meclizine (ANTIVERT) 25 MG tablet    Sig: Take 0.5-1 tablets (12.5-25 mg total) by mouth 3 (three) times daily as needed for dizziness.    Dispense:  30 tablet    Refill:  1    I personally performed the services described in this documentation, which was scribed in my presence. The recorded information has been reviewed and considered, and addended by me as  needed.  Delman Cheadle, MD MPH

## 2015-03-21 ENCOUNTER — Ambulatory Visit: Payer: Medicare Other | Attending: Family Medicine | Admitting: Rehabilitative and Restorative Service Providers"

## 2015-03-21 DIAGNOSIS — R269 Unspecified abnormalities of gait and mobility: Secondary | ICD-10-CM | POA: Diagnosis not present

## 2015-03-21 DIAGNOSIS — H8112 Benign paroxysmal vertigo, left ear: Secondary | ICD-10-CM

## 2015-03-21 NOTE — Therapy (Signed)
[facility] [address] [place], [place], [address] Phone: [phone]   Fax:  [phone]  Physical Therapy Evaluation  Patient Details  Name: Alice [name] MRN: [id number] Date of Birth: 23-Nov-1930 Referring Provider: Delman Cheadle, MD  Encounter Date: 03/21/2015      PT End of Session - 03/21/15 1508    Visit Number 1   Number of Visits 4   Date for PT Re-Evaluation 04/20/15   Authorization Type G code every 10th visit   PT Start Time 1240   PT Stop Time 1320   PT Time Calculation (min) 40 min   Activity Tolerance Patient tolerated treatment well   Behavior During Therapy [facility] for tasks assessed/performed      Past Medical History  Diagnosis Date  . Hypothyroidism   . Breast cancer ([place])      Right , chemo, radiation  . Scoliosis     L4 L5  . History of diverticulitis of colon   . Alopecia   . Hypercholesteremia   . Seasonal allergies   . DDD (degenerative disc disease)   . SUI (stress urinary incontinence, female)   . Heart palpitations   . Insect bites   . History of diverticulitis     Past Surgical History  Procedure Laterality Date  . Breast lumpectomy  2012    right  . Tonsillectomy  2001    Diverticulitis  . Hernia repair      RIH, Dr. Rise Patience  . Port-a-cath removal    . Cataract extraction      left  . Bladder suspension    . Colon surgery  20 YRS AGO    BOWEL RESECTION  . Total hip arthroplasty Right 09/10/2014    Procedure: RIGHT TOTAL HIP ARTHROPLASTY ANTERIOR APPROACH;  Surgeon: Mcarthur Rossetti, MD;  Location: [facility];  Service: Orthopedics;  Laterality: Right;  . Joint replacement    . Eye surgery      There were no vitals filed for this visit.  Visit Diagnosis:  BPPV (benign paroxysmal positional vertigo), left  Abnormality of gait      Subjective Assessment - 03/21/15 1242    Subjective Dizziness is worse at night when turning over or when getting up after long  periods of sitting.  She reports onset of vertigo in mid December after taking cefalexin and prednisone associated with sinus infection.  She stopped taking the medications and noted that dizziness continued.  Her current spells of dizziness last for seconds.  She notes imbalance with walking worse when first rising.     Pertinent History She has a service dog for orthopedic reasons per her report.   Patient Stated Goals Make vertigo go aweay.   Currently in Pain? No/denies  "I feel like my head is a pumpkin."            [facility] PT Assessment - 03/21/15 1249    Assessment   Medical Diagnosis vertigo   Referring Provider Delman Cheadle, MD   Onset Date/Surgical Date --  December 2016   Prior Therapy none   Precautions   Precautions None   Balance Screen   Has the patient fallen in the past 6 months No   Has the patient had a decrease in activity level because of a fear of falling?  No   Is the patient reluctant to leave their home because of a fear of falling?  No   Home Ecologist residence  Living Arrangements Alone   Type of [place] to enter   Entrance Stairs-Number of Steps 2   Entrance Stairs-Rails None  no issues on steps   Home Layout One level   Home Equipment None   Prior Function   Level of Independence Independent   Observation/Other Assessments   Focus on Therapeutic Outcomes (FOTO)  56%   Other Surveys  --  DHI=42%            Vestibular Assessment - 03/21/15 1252    Vestibular Assessment   General Observation Patient walks into clinical independently with service dog.   Symptom Behavior   Type of Dizziness Imbalance  spinning sensation.   Frequency of Dizziness daily   Duration of Dizziness seconds   Aggravating Factors Rolling to right;Rolling to left  lifting head quickly   Relieving Factors Lying supine   Occulomotor Exam   Occulomotor Alignment Normal   Spontaneous Absent   Gaze-induced Absent    Smooth Pursuits Intact   Saccades Intact   Vestibulo-Occular Reflex   VOR 1 Head Only (x 1 viewing) slow space    Comment head thrust test positive to the right side for refixation saccade   Positional Testing   Dix-Hallpike Dix-Hallpike Right;Dix-Hallpike Left   Sidelying Test --   Horizontal Canal Testing Horizontal Canal Right;Horizontal Canal Left   Dix-Hallpike Right   Dix-Hallpike Right Duration none   Dix-Hallpike Right Symptoms No nystagmus   Dix-Hallpike Left   Dix-Hallpike Left Duration 20 seconds   Dix-Hallpike Left Symptoms Upbeat, left rotatory nystagmus   Horizontal Canal Right   Horizontal Canal Right Duration none   Horizontal Canal Right Symptoms Normal   Horizontal Canal Left   Horizontal Canal Left Duration 20 seconds   Horizontal Canal Left Symptoms Nystagmus  in upbeat, rotary plane indicating posterior L canal                Vestibular Treatment/Exercise - 03/21/15 1512    Vestibular Treatment/Exercise   Vestibular Treatment Provided Canalith Repositioning   Canalith Repositioning Epley Manuever Left    EPLEY MANUEVER LEFT   Number of Reps  1   Overall Response  Symptoms Resolved    RESPONSE DETAILS LEFT retested with L dix hallpike- no nystagmus noted.               PT Education - 03/21/15 1502    Education provided Yes   Education Details nature of BPPV; handout from vestibular disorders association   Person(s) Educated Patient   Methods Explanation   Comprehension Verbalized understanding             PT Long Term Goals - 03/21/15 1508    PT LONG TERM GOAL #1   Title The patient will be indep with HEP for gaze and balance activities, as indicated.   Baseline Target date 04/20/2015   Time 4   Period Weeks   PT LONG TERM GOAL #2   Title The patient will have negative L dix hallpike for nystagmus or dizziness indicating resolution of BPPV.   Baseline Target date 04/20/2015   Time 4   Period Weeks   PT LONG TERM GOAL #3    Title The patient will decrease DHI by 15% to demo reduction in self perception of dizziness.   Baseline Target date 04/20/2015   Time 4   Period Weeks   PT LONG TERM GOAL #4   Title Further assess balance and goals to follow, as indicated.  Baseline Target date 04/20/2015   Time 4   Period Weeks               Plan - 03/21/15 1510    Clinical Impression Statement The patient is an 82 yo female referred to PT for vestibular rehab with nystagmus during positional testing with L dix hallpike.  The patient responded well to Epley's maneuver and had resolution of symptoms after first repetition.  Plan to reassess as needed and progress balance and gaze activities if indicated.   Pt will benefit from skilled therapeutic intervention in order to improve on the following deficits Abnormal gait;Dizziness   Rehab Potential Good   PT Frequency 1x / week   PT Duration 4 weeks   PT Treatment/Interventions Canalith Repostioning;Balance training;Neuromuscular re-education;Patient/family education;Functional mobility training;Therapeutic exercise   PT Next Visit Plan Check BPPV (L dix hallpike and L horizontal roll)   Consulted and Agree with Plan of Care Patient         Problem List Patient Active Problem List   Diagnosis Date Noted  . Osteoarthritis of right hip 09/10/2014  . Status post total replacement of right hip 09/10/2014  . Breast cancer of upper-outer quadrant of right female breast ([place]) 01/07/2013  . Arthralgia 12/08/2012  . Anxiety 02/12/2012  . Cat bite of finger 08/10/2011  . Cellulitis 08/10/2011  . Leukocytosis 08/10/2011    Graceland Wachter, PT 03/21/2015, 3:13 PM  [facility] [address] [place], [place], [address] Phone: [phone]   Fax:  [phone]  Name: Alice [name] MRN: [id number] Date of Birth: [birth year]

## 2015-03-29 DIAGNOSIS — R0981 Nasal congestion: Secondary | ICD-10-CM | POA: Diagnosis not present

## 2015-03-29 DIAGNOSIS — R2689 Other abnormalities of gait and mobility: Secondary | ICD-10-CM | POA: Diagnosis not present

## 2015-03-31 ENCOUNTER — Ambulatory Visit: Payer: Medicare Other | Attending: Family Medicine | Admitting: Rehabilitative and Restorative Service Providers"

## 2015-03-31 DIAGNOSIS — H8112 Benign paroxysmal vertigo, left ear: Secondary | ICD-10-CM | POA: Diagnosis not present

## 2015-03-31 DIAGNOSIS — R269 Unspecified abnormalities of gait and mobility: Secondary | ICD-10-CM | POA: Diagnosis not present

## 2015-03-31 NOTE — Therapy (Signed)
[facility] [address] [place], [place], [address] Phone: [phone]   Fax:  [phone]  Physical Therapy Treatment  Patient Details  Name: Alice [name] MRN: [id number] Date of Birth: 1930/12/17 Referring Provider: Delman Cheadle, MD  Encounter Date: 03/31/2015      PT End of Session - 03/31/15 1059    Visit Number 2   Number of Visits 4   Date for PT Re-Evaluation 04/20/15   Authorization Type G code every 10th visit   PT Start Time 1018   PT Stop Time 1048   PT Time Calculation (min) 30 min   Activity Tolerance Patient tolerated treatment well   Behavior During Therapy [facility] for tasks assessed/performed      Past Medical History  Diagnosis Date  . Hypothyroidism   . Breast cancer ([place])      Right , chemo, radiation  . Scoliosis     L4 L5  . History of diverticulitis of colon   . Alopecia   . Hypercholesteremia   . Seasonal allergies   . DDD (degenerative disc disease)   . SUI (stress urinary incontinence, female)   . Heart palpitations   . Insect bites   . History of diverticulitis     Past Surgical History  Procedure Laterality Date  . Breast lumpectomy  2012    right  . Tonsillectomy  2001    Diverticulitis  . Hernia repair      RIH, Dr. Rise Patience  . Port-a-cath removal    . Cataract extraction      left  . Bladder suspension    . Colon surgery  20 YRS AGO    BOWEL RESECTION  . Total hip arthroplasty Right 09/10/2014    Procedure: RIGHT TOTAL HIP ARTHROPLASTY ANTERIOR APPROACH;  Surgeon: Mcarthur Rossetti, MD;  Location: [facility];  Service: Orthopedics;  Laterality: Right;  . Joint replacement    . Eye surgery      There were no vitals filed for this visit.  Visit Diagnosis:  Abnormality of gait  BPPV (benign paroxysmal positional vertigo), left      Subjective Assessment - 03/31/15 1015    Subjective The patient reports that the vertigo with rolling in bed is improved "it's a  small miracle".  She reports that she still feels like things are not all the way improved.  She saw ENT on Tuesday and reports nothing impaired with inner ear.   Patient Stated Goals Make vertigo go aweay.   Currently in Pain? No/denies      Neuro: Negative bilateral dix hallpike test and negative bilateral sidelying test       Vestibular Treatment/Exercise - 03/31/15 1021    Vestibular Treatment/Exercise   Vestibular Treatment Provided Gaze;Habituation   Habituation Exercises Standing Horizontal Head Turns;Standing Vertical Head Turns   Gaze Exercises X1 Viewing Horizontal   Standing Horizontal Head Turns   Number of Reps  5   Symptom Description  Positioned with feet in partial heel<>toe in a corner for safety.   Standing Vertical Head Turns   Number of Reps  5   Symptom Description  Positioned in corner in partial heel<>toe position   X1 Viewing Horizontal   Foot Position standing feet apart   Reps 3   Comments Patient requires cues for gaze fixation, amplitude of movement, speed of movement.               PT Education - 03/31/15 1041    Education provided  Yes   Education Details HEP: partial heel toe +head turns, feet together + eyes closed, gaze x 1 viewing.   Person(s) Educated Patient   Methods Explanation;Demonstration;Handout   Comprehension Verbalized understanding;Returned demonstration             PT Long Term Goals - 03/31/15 1101    PT LONG TERM GOAL #1   Title The patient will be indep with HEP for gaze and balance activities, as indicated.   Baseline Target date 04/20/2015   Time 4   Period Weeks   Status On-going   PT LONG TERM GOAL #2   Title The patient will have negative L dix hallpike for nystagmus or dizziness indicating resolution of BPPV.   Baseline Met with negative L dix hallpike today.   Time 4   Period Weeks   Status Achieved   PT LONG TERM GOAL #3   Title The patient will decrease DHI by 15% to demo reduction in self  perception of dizziness.   Baseline Target date 04/20/2015   Time 4   Period Weeks   Status On-going   PT LONG TERM GOAL #4   Title Further assess balance and goals to follow, as indicated.   Baseline Target date 04/20/2015   Time 4   Period Weeks   Status On-going               Plan - 03/31/15 1156    Clinical Impression Statement The patient is negative today for L BPPV.  She continues with sensation of "my head is a jack-o-lantern" and feels this is worse with movement like her head and body are not moving well together.  PT provided balance HEP adding gaze adaptation exercises.   PT Next Visit Plan Check HEP and gaze and d/c.   Consulted and Agree with Plan of Care Patient        Problem List Patient Active Problem List   Diagnosis Date Noted  . Osteoarthritis of right hip 09/10/2014  . Status post total replacement of right hip 09/10/2014  . Breast cancer of upper-outer quadrant of right female breast ([place]) 01/07/2013  . Arthralgia 12/08/2012  . Anxiety 02/12/2012  . Cat bite of finger 08/10/2011  . Cellulitis 08/10/2011  . Leukocytosis 08/10/2011    Alice [name], PT 03/31/2015, 11:57 AM  [facility] [address] [place], [place], [address] Phone: [phone]   Fax:  [phone]  Name: Alice [name] MRN: [id number] Date of Birth: 04-Jun-1930

## 2015-03-31 NOTE — Patient Instructions (Signed)
Gaze Stabilization: Tip Card 1.Target must remain in focus, not blurry, and appear stationary while head is in motion. 2.Perform exercises with small head movements (45 to either side of midline). 3.Increase speed of head motion so long as target is in focus. 4.If you wear eyeglasses, be sure you can see target through lens (therapist will give specific instructions for bifocal / progressive lenses). 5.These exercises may provoke dizziness or nausea. Work through these symptoms. If too dizzy, slow head movement slightly. Rest between each exercise. 6.Exercises demand concentration; avoid distractions. 7.For safety, perform standing exercises close to a counter, wall, corner, or next to someone.  Copyright  VHI. All rights reserved.  Gaze Stabilization: Standing Feet Apart   Feet shoulder width apart, keeping eyes on target on wall 3 feet away, tilt head down slightly and move head side to side for 30 seconds.  Do 2 sessions per day.   Copyright  VHI. All rights reserved.  Feet Together, Varied Arm Positions - Eyes Closed    Stand with feet together and arms at your side. Close eyes and visualize upright position. Hold __30__ seconds. Repeat __3__ times per session. Do _2___ sessions per day.  Copyright  VHI. All rights reserved.   Feet Partial Heel-Toe, Head Motion - Eyes Open    With eyes open, right foot partially in front of the other, move head slowly: up and down 5 times.  Then switch feet and look side to side 5 times. Repeat __3__ times per session. Do ___2_ sessions per day.  Copyright  VHI. All rights reserved.

## 2015-04-01 ENCOUNTER — Encounter: Payer: Medicare Other | Admitting: Rehabilitative and Restorative Service Providers"

## 2015-04-13 ENCOUNTER — Ambulatory Visit (INDEPENDENT_AMBULATORY_CARE_PROVIDER_SITE_OTHER): Payer: Medicare Other | Admitting: Internal Medicine

## 2015-04-13 ENCOUNTER — Encounter: Payer: Self-pay | Admitting: Internal Medicine

## 2015-04-13 ENCOUNTER — Other Ambulatory Visit: Payer: Self-pay | Admitting: Oncology

## 2015-04-13 VITALS — BP 170/90 | HR 85 | Temp 97.5°F | Resp 18 | Ht 62.0 in | Wt 133.6 lb

## 2015-04-13 DIAGNOSIS — E034 Atrophy of thyroid (acquired): Secondary | ICD-10-CM | POA: Diagnosis not present

## 2015-04-13 DIAGNOSIS — K219 Gastro-esophageal reflux disease without esophagitis: Secondary | ICD-10-CM

## 2015-04-13 DIAGNOSIS — F419 Anxiety disorder, unspecified: Secondary | ICD-10-CM | POA: Diagnosis not present

## 2015-04-13 DIAGNOSIS — G894 Chronic pain syndrome: Secondary | ICD-10-CM

## 2015-04-13 DIAGNOSIS — M2550 Pain in unspecified joint: Secondary | ICD-10-CM | POA: Diagnosis not present

## 2015-04-13 DIAGNOSIS — J301 Allergic rhinitis due to pollen: Secondary | ICD-10-CM

## 2015-04-13 DIAGNOSIS — C50411 Malignant neoplasm of upper-outer quadrant of right female breast: Secondary | ICD-10-CM | POA: Diagnosis not present

## 2015-04-13 DIAGNOSIS — E038 Other specified hypothyroidism: Secondary | ICD-10-CM

## 2015-04-13 DIAGNOSIS — R42 Dizziness and giddiness: Secondary | ICD-10-CM | POA: Diagnosis not present

## 2015-04-13 MED ORDER — MONTELUKAST SODIUM 10 MG PO TABS: 10.0000 mg | ORAL_TABLET | Freq: Every day | ORAL | Status: DC

## 2015-04-13 NOTE — Patient Instructions (Signed)
Start singulair at bedtime for allergic rhinitis  Recommend using humidifier at night, continue saline nasal spray as needed, may need flonase daily.  Continue other medications as ordered  Follow up with oncology as scheduled  Check blood pressure daily and record. Bring diary to next visit.  Follow up in 3 mos for CPE/MMSE/ECG

## 2015-04-13 NOTE — Progress Notes (Signed)
Patient ID: Alice [name], female   DOB: Jun 18, 1930, 82 y.o.   MRN: [id number]    Location:    [facility]   Place of Service:  OFFICE    Advanced Directive information Does patient have an advance directive?: Yes, Type of Advance Directive: Healthcare Power of [place];Living will, Does patient want to make changes to advanced directive?: No - Patient declined  Chief Complaint  Patient presents with  . Establish Care    New Patient Establish Care  . Medical Management of Chronic Issues    HPI:  82 yo female seen today as a new pt. She is c/a seasonal allergy. She reports head congestion since Dec 2016. Using saline nasal spray prn. She was rx cefdinir and prednisone for sinusitis. She got extremely dizzy and ended up stopping  both medications. She then was seen by urgent care and xray revealed right sinus congestion but no sinusitis. She saw ENT Dr Redmond Baseman and no sinusitis seen. No further tx recommended. She has post nasal drip. She does not take antihistamine due to she has had sedation with them. She has never tried singulair. She is currently seeing PT for vertigo  She has increased stressors at home.   Thyroid - takes levothyroxine  Breast cancer - takes tamoxifen. Followed by heme onc. She has anxiety and takes lorazepam prn. She has a service dog  Chronic pain - takes aleve  GERD - stable on omeprazole  Elevated BP - she reports hx white coat syndrome  Past Medical History  Diagnosis Date  . Hypothyroidism   . Breast cancer ([place])      Right , chemo, radiation  . Scoliosis     L4 L5  . History of diverticulitis of colon   . Alopecia   . Hypercholesteremia   . Seasonal allergies   . DDD (degenerative disc disease)   . SUI (stress urinary incontinence, female)   . Heart palpitations   . Insect bites   . History of diverticulitis     Past Surgical History  Procedure Laterality Date  . Breast lumpectomy  2012    right  . Tonsillectomy  2001    Diverticulitis  .  Hernia repair      [facility], Dr. Rise Patience  . Port-a-cath removal    . Cataract extraction      left  . Bladder suspension    . Colon surgery  20 YRS AGO    BOWEL RESECTION  . Total hip arthroplasty Right 09/10/2014    Procedure: RIGHT TOTAL HIP ARTHROPLASTY ANTERIOR APPROACH;  Surgeon: Mcarthur Rossetti, MD;  Location: [facility];  Service: Orthopedics;  Laterality: Right;  . Joint replacement    . Eye surgery    . Bowel rescetion    . Bowel resection      Patient Care Team: Velna Hatchet, MD as PCP - General (Internal Medicine)  Social History   Social History  . Marital Status: Divorced    Spouse Name: N/A  . Number of Children: 3  . Years of Education: N/A   Occupational History  . Retired    Social History Main Topics  . Smoking status: Former Smoker    Quit date: 08/09/1964  . Smokeless tobacco: Never Used  . Alcohol Use: Yes     Comment: RARE  . Drug Use: No  . Sexual Activity: Not on file   Other Topics Concern  . Not on file   Social History Narrative   Tobacco use, amount per day now:  N/A      Past tobacco use, amount per day: 1 pack, Quit 1969      How many years did you use tobacco: N/A      Alcohol use (drinks per week): Occassionally. Glass of wine- Holidays      Diet: N/A      Do you drink/eat things with caffeine? Coffee and Tea      Marital status: Divorced             What year were you married?      Do you live in a house, apartment, assisted living, condo, trailer? Condo      Is it one or more stories? One      How many persons live in your home? Self and Service Dog      Do you have any pets in your home? 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 Walking            How often? Usually daily 2-4 times      Do you have a living will? Yes      Do you have a DNR form? N/A            If not, do you want to discuss one? N/A      Do you have signed POA/HPOA forms? No                      reports that she quit smoking about 50 years ago. She has never used smokeless tobacco. She reports that she drinks alcohol. She reports that she does not use illicit drugs.  Family History  Problem Relation Age of Onset  . Heart disease Mother   . Heart disease Father   . Cancer Sister 80    lymphoma  . Skin cancer Brother 43    melanoma  . Colon cancer    . CVA Father 67  . Asthma Son   . Asthma Son   . Asthma Son    Family Status  Relation Status Death Age  . Mother Deceased   . Father Deceased   . Sister Deceased   . Brother Deceased   . Son Alive   . Son Alive   . Son Alive      There is no immunization history on file for this patient.  Allergies  Allergen Reactions  . Amoxicillin Rash  . Diphenhydramine Rash    "It is the red dye in Benadryl that I''m allergic to"  . Naproxen Sodium Rash    "It is the blue dye in naproxen that I'm allergic to."  . Lipitor [Atorvastatin]   . Meclizine Other (See Comments)    Worsening dizziness and blurred vision  . Cefdinir Other (See Comments)    Vertigo and headaches   . Codeine Other (See Comments)    unknown  . Methocarbamol Other (See Comments)    insomnia   . Prednisone Other (See Comments)    vertigo and headaches  . Skelaxin [Metaxalone] Other (See Comments)    Gastric upset.   . Tramadol Nausea Only  . Hctz [Hydrochlorothiazide] Itching    Medications: Patient's Medications  New Prescriptions   No medications on file  Previous Medications   ASPIRIN EC 81 MG TABLET    Take 81 mg by mouth.   FISH OIL-OMEGA-3 FATTY ACIDS 1000 MG CAPSULE  Take 2 g by mouth 2 (two) times daily.    LEVOTHYROXINE (SYNTHROID) 100 MCG TABLET    Take 100 mcg by mouth daily before breakfast.    LORAZEPAM (ATIVAN) 0.5 MG TABLET    Take 1 tablet (0.5 mg total) by mouth daily as needed for anxiety.   LOTEPREDNOL (LOTEMAX) 0.5 % OPHTHALMIC SUSPENSION    Place 1 drop into both eyes as needed.   MECLIZINE (ANTIVERT)  25 MG TABLET    Take 0.5-1 tablets (12.5-25 mg total) by mouth 3 (three) times daily as needed for dizziness.   MULTIPLE VITAMIN (MULTIVITAMIN WITH MINERALS) TABS TABLET    Take 1 tablet by mouth daily.   NAPROXEN SODIUM (ANAPROX) 220 MG TABLET    Take 220 mg by mouth 2 (two) times daily. Reported on 03/12/2015   NEOMYCIN-BACITRACIN-POLYMYXIN (NEOSPORIN) OINTMENT    Apply 1 application topically daily as needed for wound care. Reported on 03/12/2015   OMEPRAZOLE (PRILOSEC) 40 MG CAPSULE    Take 1 capsule (40 mg total) by mouth daily.   TAMOXIFEN (NOLVADEX) 20 MG TABLET    TAKE 1 TABLET DAILY  Modified Medications   No medications on file  Discontinued Medications   CEFDINIR (OMNICEF) 300 MG CAPSULE    Take 300 mg by mouth. Reported on 04/13/2015   METHOCARBAMOL (ROBAXIN) 500 MG TABLET    Take 500 mg by mouth.   PREDNISONE (DELTASONE) 10 MG TABLET    Take 10 mg by mouth. Reported on 04/13/2015   TRAMADOL (ULTRAM) 50 MG TABLET    Take 50 mg by mouth. Reported on 04/13/2015    Review of Systems  Filed Vitals:   04/13/15 1114  BP: 170/90  Pulse: 85  Temp: 97.5 F (36.4 C)  TempSrc: Oral  Resp: 18  Height: 5\' 2"  (1.575 m)  Weight: 133 lb 9.6 oz (60.601 kg)  SpO2: 94%   Body mass index is 24.43 kg/(m^2).  Physical Exam  Constitutional: She is oriented to person, place, and time. She appears well-developed and well-nourished.  Looks well in NAD. Service dog present  HENT:  Mouth/Throat: Oropharynx is clear and moist. No oropharyngeal exudate.  TM dull b/l but intact and no redness or bulging. Right maxillary sinus boggy tissue texture changes, min TTP. Nares with enlarged right turbinate but left patent. Oropharynx cobblestoning and red but no exudate  Eyes: Pupils are equal, round, and reactive to light. No scleral icterus.  Neck: Neck supple. Carotid bruit is not present. No tracheal deviation present.  Cardiovascular: Normal rate, regular rhythm, normal heart sounds and intact distal  pulses.  Exam reveals no gallop and no friction rub.   No murmur heard. No LE edema b/l. no calf TTP.   Pulmonary/Chest: Effort normal and breath sounds normal. No stridor. No respiratory distress. She has no wheezes. She has no rales.  Abdominal: Soft. Bowel sounds are normal. She exhibits no distension and no mass. There is no hepatomegaly. There is no tenderness. There is no rebound and no guarding.  Musculoskeletal: She exhibits edema.  Lymphadenopathy:    She has no cervical adenopathy.  Neurological: She is alert and oriented to person, place, and time.  Skin: Skin is warm and dry. No rash noted.  Psychiatric: She has a normal mood and affect. Her behavior is normal. Judgment and thought content normal.     Labs reviewed: Abstract on 04/13/2015  Component Date Value Ref Range Status  . Hemoglobin 10/21/2014 13.7  12.0 - 16.0 g/dL Final  . HCT  10/21/2014 42  36 - 46 % Final  . Platelets 10/21/2014 211  150 - 399 K/L Final  . WBC 10/21/2014 5.4   Final  . Glucose 10/21/2014 91   Final  . BUN 10/21/2014 24* 4 - 21 mg/dL Final  . Creatinine 10/21/2014 0.7  0.5 - 1.1 mg/dL Final  . Potassium 10/21/2014 4.3  3.4 - 5.3 mmol/L Final  . Sodium 10/21/2014 141  137 - 147 mmol/L Final  . Triglycerides 10/21/2014 53  40 - 160 mg/dL Final  . Cholesterol 10/21/2014 230* 0 - 200 mg/dL Final  . HDL 10/21/2014 73* 35 - 70 mg/dL Final  . LDL Cholesterol 10/21/2014 146   Final  . Alkaline Phosphatase 10/21/2014 28  25 - 125 U/L Final  . ALT 10/21/2014 23  7 - 35 U/L Final  . AST 10/21/2014 18  13 - 35 U/L Final  . Bilirubin, Total 10/21/2014 0.4   Final  . TSH 10/21/2014 0.34* 0.41 - 5.90 uIU/mL Final  Office Visit on 03/12/2015  Component Date Value Ref Range Status  . WBC 03/12/2015 7.7  4.6 - 10.2 K/uL Final  . Lymph, poc 03/12/2015 2.6  0.6 - 3.4 Final  . POC LYMPH PERCENT 03/12/2015 33.3  10 - 50 %L Final  . MID (cbc) 03/12/2015 0.3  0 - 0.9 Final  . POC MID % 03/12/2015 4.4  0 - 12  %M Final  . POC Granulocyte 03/12/2015 4.8  2 - 6.9 Final  . Granulocyte percent 03/12/2015 62.3  37 - 80 %G Final  . RBC 03/12/2015 4.84  4.04 - 5.48 M/uL Final  . Hemoglobin 03/12/2015 13.9  12.2 - 16.2 g/dL Final  . HCT, POC 03/12/2015 41.7  37.7 - 47.9 % Final  . MCV 03/12/2015 86.2  80 - 97 fL Final  . MCH, POC 03/12/2015 28.6  27 - 31.2 pg Final  . MCHC 03/12/2015 33.2  31.8 - 35.4 g/dL Final  . RDW, POC 03/12/2015 16.2   Final  . Platelet Count, POC 03/12/2015 243  142 - 424 K/uL Final  . MPV 03/12/2015 7.1  0 - 99.8 fL Final  . POCT SED RATE 03/12/2015 11  0 - 22 mm/hr Final    No results found.   Assessment/Plan   ICD-9-CM ICD-10-CM   1. Allergic rhinitis due to pollen 477.0 J30.1 montelukast (SINGULAIR) 10 MG tablet  2. Dizziness and giddiness 780.4 R42   3. Anxiety 300.00 F41.9   4. Breast cancer of upper-outer quadrant of right female breast ([facility]) 174.4 C50.411   5. Hypothyroidism due to acquired atrophy of thyroid 244.8 E03.8    246.8 E03.4   6. Gastroesophageal reflux disease without esophagitis 530.81 K21.9   7. Chronic pain syndrome 338.4 G89.4   8. Pain, joint, multiple sites 719.49 M25.50    Start singulair at bedtime for allergic rhinitis  Recommend using humidifier at night, continue saline nasal spray as needed, may need flonase daily.  Continue other medications as ordered  Follow up with oncology as scheduled  Check blood pressure daily and record. Bring diary to next visit.  Follow up in 3 mos for CPE/MMSE/ECG    Kaylin Marcon S. Perlie Gold  [facility] [address] [place], [place] [address] [phone] Cell (Monday-Friday 8 AM - 5 PM) [phone] After 5 PM and follow prompts

## 2015-04-19 ENCOUNTER — Telehealth: Payer: Self-pay | Admitting: *Deleted

## 2015-04-19 MED ORDER — AMLODIPINE BESYLATE 5 MG PO TABS: ORAL_TABLET | ORAL | Status: DC

## 2015-04-19 NOTE — Telephone Encounter (Signed)
Rx amlodipine 5mg  #30 take 1 po daily with 4 RF. Continue home BP check BID. Call if <100/50 or remains >140/90

## 2015-04-19 NOTE — Telephone Encounter (Signed)
Patient called and stated that you wanted her to monitor her blood pressure. She checked it this morning and it was 132/57 and checked it again at lunch time and it was 201/83 and patient is feeling a lot of pressure at back of head. Not on any blood pressure medication.  Please Advise.

## 2015-04-19 NOTE — Telephone Encounter (Signed)
Patient notified and Rx faxed to pharmacy. Medication list Updated

## 2015-04-20 ENCOUNTER — Ambulatory Visit: Payer: Medicare Other | Admitting: Rehabilitative and Restorative Service Providers"

## 2015-04-20 DIAGNOSIS — H8112 Benign paroxysmal vertigo, left ear: Secondary | ICD-10-CM | POA: Diagnosis not present

## 2015-04-20 DIAGNOSIS — R269 Unspecified abnormalities of gait and mobility: Secondary | ICD-10-CM | POA: Diagnosis not present

## 2015-04-20 NOTE — Therapy (Signed)
[facility] [address] [place], [place], [address] Phone: [phone]   Fax:  [phone]  Physical Therapy Treatment  Patient Details  Name: Alice [name] MRN: [id number] Date of Birth: 08-Jan-1931 Referring Provider: Delman Cheadle, MD  Encounter Date: 04/20/2015      PT End of Session - 04/20/15 1302    Visit Number 3   Number of Visits 4   Date for PT Re-Evaluation 04/20/15   Authorization Type G code every 10th visit   PT Start Time 1235   PT Stop Time 1305   PT Time Calculation (min) 30 min   Activity Tolerance Patient tolerated treatment well   Behavior During Therapy [facility] for tasks assessed/performed      Past Medical History  Diagnosis Date  . Hypothyroidism   . Breast cancer ([place])      Right , chemo, radiation  . Scoliosis     L4 L5  . History of diverticulitis of colon   . Alopecia   . Hypercholesteremia   . Seasonal allergies   . DDD (degenerative disc disease)   . SUI (stress urinary incontinence, female)   . Heart palpitations   . Insect bites   . History of diverticulitis     Past Surgical History  Procedure Laterality Date  . Breast lumpectomy  2012    right  . Tonsillectomy  2001    Diverticulitis  . Hernia repair      RIH, Dr. Rise Patience  . Port-a-cath removal    . Cataract extraction      left  . Bladder suspension    . Colon surgery  20 YRS AGO    BOWEL RESECTION  . Total hip arthroplasty Right 09/10/2014    Procedure: RIGHT TOTAL HIP ARTHROPLASTY ANTERIOR APPROACH;  Surgeon: Mcarthur Rossetti, MD;  Location: [facility];  Service: Orthopedics;  Laterality: Right;  . Joint replacement    . Eye surgery    . Bowel rescetion    . Bowel resection      There were no vitals filed for this visit.  Visit Diagnosis:  Abnormality of gait      Subjective Assessment - 04/20/15 1240    Subjective The patient reports that she no longer has vertigo, however she notes if she rises  quickly or raises her head to roll over she has occasional episodes.  She feels that she continues with congestion.  The patient has had hypertension with headaches.  The exercises are "boring" and she reports they are easy.    Patient Stated Goals Make vertigo go aweay.   Currently in Pain? No/denies                Vestibular Assessment - 04/20/15 1249    Positional Testing   Sidelying Test Sidelying Right;Sidelying Left   Horizontal Canal Testing Horizontal Canal Right;Horizontal Canal Left   Sidelying Right   Sidelying Right Duration none   Sidelying Right Symptoms No nystagmus   Sidelying Left   Sidelying Left Duration trace x 5 seconds   Sidelying Left Symptoms Upbeat, left rotatory nystagmus   Horizontal Canal Right   Horizontal Canal Right Duration none   Horizontal Canal Right Symptoms Normal   Horizontal Canal Left   Horizontal Canal Left Duration none   Horizontal Canal Left Symptoms Normal                  Vestibular Treatment/Exercise - 04/20/15 1250    Vestibular Treatment/Exercise   Vestibular  Treatment Provided Canalith Repositioning;Habituation   Habituation Exercises [employer]   Gaze Exercises X1 Viewing Horizontal    EPLEY MANUEVER LEFT   Number of Reps  2   Overall Response  Symptoms Resolved    RESPONSE DETAILS LEFT no nystagmus second rep   Nestor Lewandowsky   Number of Reps  2   Symptom Description  to Left side, provokes mild symptoms               PT Education - 04/20/15 1257    Education provided Yes   Education Details HEP; habituation sit<>sidelying brandt daroff   Person(s) Educated Patient   Methods Explanation;Demonstration;Handout   Comprehension Verbalized understanding;Returned demonstration             PT Long Term Goals - 03/31/15 1101    PT LONG TERM GOAL #1   Title The patient will be indep with HEP for gaze and balance activities, as indicated.   Baseline Target date 04/20/2015   Time 4   Period  Weeks   Status On-going   PT LONG TERM GOAL #2   Title The patient will have negative L dix hallpike for nystagmus or dizziness indicating resolution of BPPV.   Baseline Met with negative L dix hallpike today.   Time 4   Period Weeks   Status Achieved   PT LONG TERM GOAL #3   Title The patient will decrease DHI by 15% to demo reduction in self perception of dizziness.   Baseline Target date 04/20/2015   Time 4   Period Weeks   Status On-going   PT LONG TERM GOAL #4   Title Further assess balance and goals to follow, as indicated.   Baseline Target date 04/20/2015   Time 4   Period Weeks   Status On-going               Plan - 04/20/15 1344    Clinical Impression Statement The patient continues with trace nystagmus when moving sit>L sidelying indicating L posterior canalithiasis.  PT provided habituation for patient to begin in 2 days if symptoms continue.   PT Next Visit Plan Check for L BPPV   Consulted and Agree with Plan of Care Patient        Problem List Patient Active Problem List   Diagnosis Date Noted  . Osteoarthritis of right hip 09/10/2014  . Status post total replacement of right hip 09/10/2014  . Breast cancer of upper-outer quadrant of right female breast ([place]) 01/07/2013  . Arthralgia 12/08/2012  . Anxiety 02/12/2012  . Cat bite of finger 08/10/2011  . Cellulitis 08/10/2011  . Leukocytosis 08/10/2011    Toriana Sponsel, PT 04/20/2015, 1:45 PM  [facility] [address] [place], [place], [address] Phone: [phone]   Fax:  [phone]  Name: Alice [name] MRN: [id number] Date of Birth: [birth year]

## 2015-04-20 NOTE — Patient Instructions (Signed)
Tip Card 1.The goal of habituation training is to assist in decreasing symptoms of vertigo, dizziness, or nausea provoked by specific head and body motions. 2.These exercises may initially increase symptoms; however, be persistent and work through symptoms. With repetition and time, the exercises will assist in reducing or eliminating symptoms. 3.Exercises should be stopped and discussed with the therapist if you experience any of the following: - Sudden change or fluctuation in hearing - New onset of ringing in the ears, or increase in current intensity - Any fluid discharge from the ear - Severe pain in neck or back - Extreme nausea  Copyright  VHI. All rights reserved.   Sit to Side-Lying   Sit on edge of bed. Lie down onto the right side and hold until dizziness stops, plus 20 seconds.  Return to sitting and wait until dizziness stops, plus 20 seconds.  Repeat to the left side. Repeat sequence 5 times per session. Do 2 sessions per day.  Copyright  VHI. All rights reserved.  Gaze Stabilization: Tip Card 1.Target must remain in focus, not blurry, and appear stationary while head is in motion. 2.Perform exercises with small head movements (45 to either side of midline). 3.Increase speed of head motion so long as target is in focus. 4.If you wear eyeglasses, be sure you can see target through lens (therapist will give specific instructions for bifocal / progressive lenses). 5.These exercises may provoke dizziness or nausea. Work through these symptoms. If too dizzy, slow head movement slightly. Rest between each exercise. 6.Exercises demand concentration; avoid distractions. 7.For safety, perform standing exercises close to a counter, wall, corner, or next to someone.  Copyright  VHI. All rights reserved.  Gaze Stabilization: Standing Feet Apart   Feet shoulder width apart, keeping eyes on target on wall 3 feet away, tilt head down slightly and move head side to side for 30 seconds.  Repeat while moving head up and down for 30 seconds. Do 2 sessions per day.   Copyright  VHI. All rights reserved.    

## 2015-04-28 ENCOUNTER — Other Ambulatory Visit: Payer: Self-pay | Admitting: Oncology

## 2015-04-28 DIAGNOSIS — F4323 Adjustment disorder with mixed anxiety and depressed mood: Secondary | ICD-10-CM | POA: Diagnosis not present

## 2015-05-02 ENCOUNTER — Other Ambulatory Visit: Payer: Self-pay | Admitting: Oncology

## 2015-05-05 ENCOUNTER — Ambulatory Visit: Payer: Medicare Other | Attending: Family Medicine | Admitting: Rehabilitative and Restorative Service Providers"

## 2015-05-05 VITALS — BP 148/88

## 2015-05-05 DIAGNOSIS — H8112 Benign paroxysmal vertigo, left ear: Secondary | ICD-10-CM

## 2015-05-05 DIAGNOSIS — R269 Unspecified abnormalities of gait and mobility: Secondary | ICD-10-CM | POA: Insufficient documentation

## 2015-05-05 NOTE — Therapy (Signed)
[facility] [address] [place], [place], [address] Phone: [phone]   Fax:  [phone]  Physical Therapy Treatment  Patient Details  Name: Alice [name] MRN: [id number] Date of Birth: 03-04-1930 Referring Provider: Delman Cheadle, MD  Encounter Date: 05/05/2015      PT End of Session - 05/05/15 1032    Visit Number 4   Number of Visits 4   Date for PT Re-Evaluation 04/20/15   Authorization Type G code every 10th visit   PT Start Time 0932   PT Stop Time 1015   PT Time Calculation (min) 43 min   Activity Tolerance Patient tolerated treatment well   Behavior During Therapy [facility] for tasks assessed/performed      Past Medical History  Diagnosis Date  . Hypothyroidism   . Breast cancer ([place])      Right , chemo, radiation  . Scoliosis     L4 L5  . History of diverticulitis of colon   . Alopecia   . Hypercholesteremia   . Seasonal allergies   . DDD (degenerative disc disease)   . SUI (stress urinary incontinence, female)   . Heart palpitations   . Insect bites   . History of diverticulitis     Past Surgical History  Procedure Laterality Date  . Breast lumpectomy  2012    right  . Tonsillectomy  2001    Diverticulitis  . Hernia repair      RIH, Dr. Rise Patience  . Port-a-cath removal    . Cataract extraction      left  . Bladder suspension    . Colon surgery  20 YRS AGO    BOWEL RESECTION  . Total hip arthroplasty Right 09/10/2014    Procedure: RIGHT TOTAL HIP ARTHROPLASTY ANTERIOR APPROACH;  Surgeon: Mcarthur Rossetti, MD;  Location: [facility];  Service: Orthopedics;  Laterality: Right;  . Joint replacement    . Eye surgery    . Bowel rescetion    . Bowel resection      Filed Vitals:   05/05/15 0947  BP: 148/88    Visit Diagnosis:  Abnormality of gait  BPPV (benign paroxysmal positional vertigo), left      Subjective Assessment - 05/05/15 0937    Subjective The patient reports continued  dizziness to the left side with HEP.  She can roll better in bed, however feels that exercises are not clearing symptoms.   Patient Stated Goals Make vertigo go aweay.   Currently in Pain? No/denies          Vestibular Assessment - 05/05/15 0938    Positional Testing   Sidelying Test Sidelying Right;Sidelying Left   Sidelying Right   Sidelying Right Duration none   Sidelying Right Symptoms No nystagmus   Sidelying Left   Sidelying Left Duration 5 second duration   Sidelying Left Symptoms Upbeat, left rotatory nystagmus   Horizontal Canal Right   Horizontal Canal Right Duration none   Horizontal Canal Right Symptoms Normal   Horizontal Canal Left   Horizontal Canal Left Duration none   Horizontal Canal Left Symptoms Normal          Vestibular Treatment/Exercise - 05/05/15 0001    Vestibular Treatment/Exercise   Vestibular Treatment Provided Habituation   Canalith Repositioning Semont Procedure Left Posterior  Liberatory performed as trial due to not clearing with Epley   Habituation Exercises Brandt Daroff;Horizontal Roll   Semont Procedure Left Posterior   Number of Reps  1  Overall Response No change  retested    Response Details  Retested with left sidelying and still noted 2 beats of nystagmus   Nestor Lewandowsky   Number of Reps  3   Symptom Description  to the left side           PT Education - 05/05/15 1032    Education provided Yes   Education Details Provided handout on [email or web address] description of BPPV.  Recommended patient continue brandt daroff habituation x 2 more weeks diligently to get trace vertigo resolved.   Person(s) Educated Patient   Methods Explanation;Demonstration;Handout   Comprehension Verbalized understanding;Returned demonstration             PT Long Term Goals - 05/05/15 1041    PT LONG TERM GOAL #1   Title The patient will be indep with HEP for gaze and balance activities, as indicated.   Baseline Target date 04/20/2015    Time 4   Period Weeks   Status Achieved   PT LONG TERM GOAL #2   Title The patient will have negative L dix hallpike for nystagmus or dizziness indicating resolution of BPPV.   Baseline Met with negative L dix hallpike today.   Time 4   Period Weeks   Status Achieved   PT LONG TERM GOAL #3   Title The patient will decrease DHI by 15% to demo reduction in self perception of dizziness.   Baseline Target date 04/20/2015   Time 4   Period Weeks   Status On-going   PT LONG TERM GOAL #4   Title Further assess balance and goals to follow, as indicated.   Baseline Patient reports intermittent imbalance only when first transitioning to standing or moving quickly.  Related to trace BPPV that has not yet resolved.    Time 4   Period Weeks   Status Achieved               Plan - 05/05/15 1042    Clinical Impression Statement The patient has improved significantly with duration of nystagmus with positional testing noting a trace (2 beats visible and sensation of movement settles within <5 seconds) nystagmus with left sidelying.  Patient has been doing habituation and PT encouraged her to continue x 2 more weeks and call if still not resolved.  PT tried liberatory maneuver as alternative treatment today (usually reserved for cupulolithiasis), however stil noted the trace nystagmus after performing.    Pt will benefit from skilled therapeutic intervention in order to improve on the following deficits Abnormal gait;Dizziness   Rehab Potential Good   PT Frequency 1x / week   PT Duration 4 weeks   PT Treatment/Interventions Canalith Repostioning;Balance training;Neuromuscular re-education;Patient/family education;Functional mobility training;Therapeutic exercise   PT Next Visit Plan Patient to try HEP x 2 more weeks and f/u with PT as needed.    Consulted and Agree with Plan of Care Patient        Problem List Patient Active Problem List   Diagnosis Date Noted  . Osteoarthritis of right  hip 09/10/2014  . Status post total replacement of right hip 09/10/2014  . Breast cancer of upper-outer quadrant of right female breast ([place]) 01/07/2013  . Arthralgia 12/08/2012  . Anxiety 02/12/2012  . Cat bite of finger 08/10/2011  . Cellulitis 08/10/2011  . Leukocytosis 08/10/2011    Rocquel Askren, PT 05/05/2015, 10:46 AM  [facility] [address] [place], [place], [address] Phone: [phone]   Fax:  [phone]  Name: Alice [name] MRN: [id number] Date of Birth: Sep 18, 1930

## 2015-05-11 ENCOUNTER — Other Ambulatory Visit: Payer: Self-pay | Admitting: Oncology

## 2015-05-20 ENCOUNTER — Other Ambulatory Visit: Payer: Self-pay | Admitting: Oncology

## 2015-05-23 ENCOUNTER — Other Ambulatory Visit: Payer: Self-pay | Admitting: *Deleted

## 2015-05-23 ENCOUNTER — Other Ambulatory Visit: Payer: Self-pay | Admitting: Oncology

## 2015-05-23 DIAGNOSIS — C50411 Malignant neoplasm of upper-outer quadrant of right female breast: Secondary | ICD-10-CM

## 2015-05-23 DIAGNOSIS — Z853 Personal history of malignant neoplasm of breast: Secondary | ICD-10-CM

## 2015-05-24 ENCOUNTER — Other Ambulatory Visit (HOSPITAL_BASED_OUTPATIENT_CLINIC_OR_DEPARTMENT_OTHER): Payer: Medicare Other

## 2015-05-24 DIAGNOSIS — Z853 Personal history of malignant neoplasm of breast: Secondary | ICD-10-CM

## 2015-05-24 DIAGNOSIS — C50411 Malignant neoplasm of upper-outer quadrant of right female breast: Secondary | ICD-10-CM

## 2015-05-24 LAB — COMPREHENSIVE METABOLIC PANEL
ALT: 21 U/L (ref 0–55)
AST: 22 U/L (ref 5–34)
Albumin: 3.5 g/dL (ref 3.5–5.0)
Alkaline Phosphatase: 42 U/L (ref 40–150)
Anion Gap: 8 mEq/L (ref 3–11)
BILIRUBIN TOTAL: 0.43 mg/dL (ref 0.20–1.20)
BUN: 18.5 mg/dL (ref 7.0–26.0)
CALCIUM: 9.2 mg/dL (ref 8.4–10.4)
CHLORIDE: 104 meq/L (ref 98–109)
CO2: 29 meq/L (ref 22–29)
CREATININE: 0.8 mg/dL (ref 0.6–1.1)
EGFR: 64 mL/min/{1.73_m2} — ABNORMAL LOW (ref >90)
Glucose: 79 mg/dl (ref 70–140)
Potassium: 4.1 mEq/L (ref 3.5–5.1)
Sodium: 141 mEq/L (ref 136–145)
TOTAL PROTEIN: 6.5 g/dL (ref 6.4–8.3)

## 2015-05-24 LAB — CBC WITH DIFFERENTIAL/PLATELET
BASO%: 0.3 % (ref 0.0–2.0)
Basophils Absolute: 0 10*3/uL (ref 0.0–0.1)
EOS%: 2.8 % (ref 0.0–7.0)
Eosinophils Absolute: 0.2 10*3/uL (ref 0.0–0.5)
HEMATOCRIT: 38.8 % (ref 34.8–46.6)
HGB: 12.7 g/dL (ref 11.6–15.9)
LYMPH#: 2.9 10*3/uL (ref 0.9–3.3)
LYMPH%: 50.3 % — ABNORMAL HIGH (ref 14.0–49.7)
MCH: 28.9 pg (ref 25.1–34.0)
MCHC: 32.7 g/dL (ref 31.5–36.0)
MCV: 88.2 fL (ref 79.5–101.0)
MONO#: 0.4 10*3/uL (ref 0.1–0.9)
MONO%: 7.2 % (ref 0.0–14.0)
NEUT%: 39.4 % (ref 38.4–76.8)
NEUTROS ABS: 2.3 10*3/uL (ref 1.5–6.5)
PLATELETS: 184 10*3/uL (ref 145–400)
RBC: 4.4 10*6/uL (ref 3.70–5.45)
RDW: 15.8 % — ABNORMAL HIGH (ref 11.2–14.5)
WBC: 5.8 10*3/uL (ref 3.9–10.3)

## 2015-05-25 DIAGNOSIS — M1611 Unilateral primary osteoarthritis, right hip: Secondary | ICD-10-CM | POA: Diagnosis not present

## 2015-05-25 DIAGNOSIS — M25551 Pain in right hip: Secondary | ICD-10-CM | POA: Diagnosis not present

## 2015-05-31 ENCOUNTER — Other Ambulatory Visit: Payer: Self-pay | Admitting: *Deleted

## 2015-05-31 ENCOUNTER — Telehealth: Payer: Self-pay | Admitting: Oncology

## 2015-05-31 ENCOUNTER — Ambulatory Visit (HOSPITAL_BASED_OUTPATIENT_CLINIC_OR_DEPARTMENT_OTHER): Payer: Medicare Other | Admitting: Oncology

## 2015-05-31 VITALS — BP 133/60 | HR 75 | Temp 97.6°F | Resp 17 | Ht 62.0 in | Wt 133.3 lb

## 2015-05-31 DIAGNOSIS — Z853 Personal history of malignant neoplasm of breast: Secondary | ICD-10-CM

## 2015-05-31 DIAGNOSIS — Z79810 Long term (current) use of selective estrogen receptor modulators (SERMs): Secondary | ICD-10-CM

## 2015-05-31 DIAGNOSIS — C50411 Malignant neoplasm of upper-outer quadrant of right female breast: Secondary | ICD-10-CM

## 2015-05-31 DIAGNOSIS — C50419 Malignant neoplasm of upper-outer quadrant of unspecified female breast: Secondary | ICD-10-CM

## 2015-05-31 MED ORDER — TAMOXIFEN CITRATE 20 MG PO TABS: 20.0000 mg | ORAL_TABLET | Freq: Every day | ORAL | Status: DC

## 2015-05-31 MED ORDER — LORAZEPAM 0.5 MG PO TABS: 0.5000 mg | ORAL_TABLET | Freq: Every day | ORAL | Status: DC | PRN

## 2015-05-31 NOTE — Progress Notes (Signed)
ID: Alice [name]   DOB: 1930/08/29  MR#: [id number]  UDT#:[id number]   Gildardo Cranker, DO SU:  Excell Seltzer, MD;  OTHER:  Thea Silversmith, MD; Silvano Rusk, MD, [name] NP, [name] MD CHIEF COMPLAINT:  Right Breast Cancer  CURRENT THERAPY: Tamoxifen   HISTORY OF PRESENT ILLNESS: From the original intake note:  [name] noted a mass in her right breast late December of 2011.  She brought this to Dr. Matthias Hughs attention and he set her up for mammography at [facility].  This showed a large spiculated mass in the posterior upper outer quadrant of the right breast which was palpable.  By ultrasound it measured 3.6 cm.  It was hypoechoic with internal blood flow.  Biopsy was suggested and performed January 17th, 2012.  The pathology from that biopsy ([id number]) showed an invasive ductal carcinoma, Grade 3, ER 95% and PR 47% positive with an MIB-1 of 50%, no HER2 amplification with a CISH ratio of 1.63.    With this information, the patient was referred to Dr. Barry Dienes, and bilateral breast MRIs were obtained March 20, 2010.  This showed in the posterior 3rd of the outer right breast a 3.8 cm mass with a clip artifact and in addition in the axillary tail of the right breast, a 9 mm rounded mildly irregular mass, corresponding to an abnormal appearing lymph node seen at mammography.  There were no other areas suspicious for enhancement.     Her subsequent history is as summarized below  INTERVAL HISTORY: Alice [name] returns today for follow-up of her estrogen receptor positive breast cancer accompanied by her significant other, Therapy Dog Max. She continues on tamoxifen, with excellent tolerance. She has no side effects attributable to this. She obtains a drug at a very good price.  REVIEW OF SYSTEMS: Alice [name] has been under some stress because she was released from her primary care physician's care. She has established herself however with a new primary care  physician and does like her. She was started on a blood pressure medication and she thinks is making her a little bit on the wall side. She has some nuchal headaches occasionally which he attributes I think directly to stress. Also since her last visit here she had her right hip replacement under [name] and that went really well. She exercises chiefly by walking Max, but he spends a lot of time sniffing so is more standing and walking. She had vertigo but got that resolved. A detailed review of systems today was otherwise stable  PAST MEDICAL HISTORY: Past Medical History  Diagnosis Date  . Hypothyroidism   . Breast cancer ([place])      Right , chemo, radiation  . Scoliosis     L4 L5  . History of diverticulitis of colon   . Alopecia   . Hypercholesteremia   . Seasonal allergies   . DDD (degenerative disc disease)   . SUI (stress urinary incontinence, female)   . Heart palpitations   . Insect bites   . History of diverticulitis   1. Greater than 30 pack-year tobacco abuse, the patient quitting in the sixties.  2. History of sling procedure. 3. Seasonal allergies. 4. Status post hernia repair under Dr. Rise Patience. 5. Status post small bowel resection for diverticular disease in 2001.  6. Status post left cataract surgery. 7. History of tonsillectomy. 8. History of palpitations.   9. History of degenerative disk disease. 10. Hypothyroidism. 11. Hypercholesterolemia. 12. Hypertension. Stress urinary incontinence  PAST SURGICAL HISTORY: Past Surgical History  Procedure Laterality Date  . Breast lumpectomy  2012    right  . Tonsillectomy  2001    Diverticulitis  . Hernia repair      RIH, Dr. Rise Patience  . Port-a-cath removal    . Cataract extraction      left  . Bladder suspension    . Colon surgery  20 YRS AGO    BOWEL RESECTION  . Total hip arthroplasty Right 09/10/2014    Procedure: RIGHT TOTAL HIP ARTHROPLASTY ANTERIOR APPROACH;  Surgeon: Mcarthur Rossetti, MD;   Location: [facility];  Service: Orthopedics;  Laterality: Right;  . Joint replacement    . Eye surgery    . Bowel rescetion    . Bowel resection      FAMILY HISTORY The patient's father died from a stroke at the age of 85.  The patient's mother died with congestive heart failure at the age of 37.  The patient has one sister who died with non-Hodgkin's lymphoma at the age of 30.  She has a brother who was a Manufacturing engineer and died from unknown causes.    GYNECOLOGIC HISTORY: She is GX P3, first pregnancy to term at age 25.  She had menarche at age 89.  She went through menopause in her fifties. 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 Son Chilton Si lives in [place] where he works in Engineer, technical sales.  He is the patient's healthcare power of attorney and may be reached at [phone].  The patient's son Sherren Mocha also works in IT in the [place] area.  Son L. Terrance Child psychotherapist was Scientist, physiological of [employer] but is currently suing [employer] which has let him go. The patient has 5 grandchildren and 2 great-grandchildren. She attends [employer].      ADVANCED DIRECTIVES: In place  HEALTH MAINTENANCE: (Updated 12/08/2012) Social History  Substance Use Topics  . Smoking status: Former Smoker    Quit date: 08/09/1964  . Smokeless tobacco: Never Used  . Alcohol Use: Yes     Comment: RARE    Colonoscopy: never  PAP: 2001  Bone density: July 2014, normal  Lipid panel: Followed by Dr. Velna Hatchet   Allergies  Allergen Reactions  . Amoxicillin Rash  . Diphenhydramine Rash    "It is the red dye in Benadryl that I''m allergic to"  . Naproxen Sodium Rash    "It is the blue dye in naproxen that I'm allergic to."  . Lipitor [Atorvastatin]   .  Meclizine Other (See Comments)    Worsening dizziness and blurred vision  . Cefdinir Other (See Comments)    Vertigo and headaches   . Codeine Other (See Comments)    unknown  . Methocarbamol Other (See Comments)    insomnia   . Prednisone Other (See Comments)    vertigo and headaches  . Skelaxin [Metaxalone] Other (See Comments)    Gastric upset.   . Tramadol Nausea Only  . Hctz [Hydrochlorothiazide] Itching    Current Outpatient Prescriptions  Medication Sig Dispense Refill  . amLODipine (NORVASC) 5 MG tablet Take one tablet by mouth once daily to control blood pressure 30 tablet 4  . aspirin EC 81 MG tablet Take 81 mg by mouth.    Marland Kitchen  fish oil-omega-3 fatty acids 1000 MG capsule Take 2 g by mouth 2 (two) times daily.     Marland Kitchen levothyroxine (SYNTHROID) 100 MCG tablet Take 100 mcg by mouth daily before breakfast.     . LORazepam (ATIVAN) 0.5 MG tablet Take 1 tablet (0.5 mg total) by mouth daily as needed for anxiety. 90 tablet 0  . loteprednol (LOTEMAX) 0.5 % ophthalmic suspension Place 1 drop into both eyes as needed.    . meclizine (ANTIVERT) 25 MG tablet Take 0.5-1 tablets (12.5-25 mg total) by mouth 3 (three) times daily as needed for dizziness. 30 tablet 1  . montelukast (SINGULAIR) 10 MG tablet Take 1 tablet (10 mg total) by mouth at bedtime. (Patient not taking: Reported on 04/20/2015) 30 tablet 6  . Multiple Vitamin (MULTIVITAMIN WITH MINERALS) TABS tablet Take 1 tablet by mouth daily.    . naproxen sodium (ANAPROX) 220 MG tablet Take 220 mg by mouth 2 (two) times daily. Reported on 03/12/2015    . neomycin-bacitracin-polymyxin (NEOSPORIN) ointment Apply 1 application topically daily as needed for wound care. Reported on 03/12/2015    . omeprazole (PRILOSEC) 40 MG capsule Take 1 capsule (40 mg total) by mouth daily. 90 capsule 4  . tamoxifen (NOLVADEX) 20 MG tablet TAKE 1 TABLET DAILY 90 tablet 2   No current facility-administered medications for this visit.    OBJECTIVE:  Middle-aged white woman In no acute distress Filed Vitals:   05/31/15 1337  BP: 133/60  Pulse: 75  Temp: 97.6 F (36.4 C)  Resp: 17     Body mass index is 24.37 kg/(m^2).    ECOG FS: 1 Filed Weights   05/31/15 1337  Weight: 133 lb 4.8 oz (60.464 kg)    Sclerae unicteric, EOMs intact Oropharynx clear, dentition in good repair No cervical or supraclavicular adenopathy Lungs no rales or rhonchi Heart regular rate and rhythm Abd soft, nontender, positive bowel sounds MSK no focal spinal tenderness, no upper extremity lymphedema Neuro: nonfocal, well oriented, appropriate affect Breasts: The right breast is status post lumpectomy and radiation. There is no evidence of local recurrence. The right axilla is benign. The left breast is unremarkable.    LAB RESULTS: Lab Results  Component Value Date   WBC 5.8 05/24/2015   NEUTROABS 2.3 05/24/2015   HGB 12.7 05/24/2015   HCT 38.8 05/24/2015   MCV 88.2 05/24/2015   PLT 184 05/24/2015      Chemistry      Component Value Date/Time   NA 141 05/24/2015 0946   NA 141 10/21/2014   NA 139 09/11/2014 0440   K 4.1 05/24/2015 0946   K 4.3 10/21/2014   CL 103 09/11/2014 0440   CL 104 08/04/2012 1350   CO2 29 05/24/2015 0946   CO2 31 09/11/2014 0440   BUN 18.5 05/24/2015 0946   BUN 24* 10/21/2014   BUN 15 09/11/2014 0440   CREATININE 0.8 05/24/2015 0946   CREATININE 0.7 10/21/2014   CREATININE 0.75 09/11/2014 0440   GLU 91 10/21/2014      Component Value Date/Time   CALCIUM 9.2 05/24/2015 0946   CALCIUM 8.3* 09/11/2014 0440   ALKPHOS 42 05/24/2015 0946   ALKPHOS 28 10/21/2014   AST 22 05/24/2015 0946   AST 18 10/21/2014   ALT 21 05/24/2015 0946   ALT 23 10/21/2014   BILITOT 0.43 05/24/2015 0946   BILITOT 0.6 08/10/2011 1054      STUDIES: Annual mammography scheduled for 06/01/2015  ASSESSMENT: 82 y.o. Alice [name] woman,   (  1)  status post right breast biopsy March 17, 2010 for a grade 3 invasive ductal carcinoma  measuring 3.8 cm by MRI with a suspicious right axillary lymph node which was, however, negative by biopsy, the tumor being estrogen and progesterone receptor positive, HER2-negative, with an MIB-1 of 50%.    (2)  Neoadjuvantly, she was treated with dose-dense doxorubicin and cyclophosphamide x4 followed by weekly paclitaxel x 12, completed July 2012 followed by definitive right lumpectomy and sentinel lymph node sampling August 2012 showing a complete pathologic response.   (3)  She completed radiation treatments November 2012 and started tamoxifen at that time.  (4)  discontinued tamoxifen in approximately July of 2014, and was switched to anastrozole for approximately 2 weeks between 10/31/2012 and 11/12/2012,  discontinued secondary to bone and muscle [name].  (5)  started letrozole October 2014, discontinued because of headaches, nausea, vomiting, and aches and pains.  (6) resumed tamoxifen in November 2014    PLAN:  Alice [name] is now just about 5 years out from definitive surgery for her early stage breast cancer. There is no evidence of disease recurrence. This is very favorable.  Normally she would have stops tamoxifen July of this year, but there was a several month interval or she was really on and off aromatase inhibitors before going back to tamoxifen. I am going to count 5 years from a year from now.  The question at the next visit, is whether she would like to take tamoxifen for 10 years. She understands the benefit is small but measurable, a 3% risk reduction.  Since she is having no side effects from the tamoxifen and obtains it under very good price, she is strongly considering continuing to a total of 10 years. This is entirely reasonable.  Today revealed she requested a refill of her lorazepam which I was glad to do.  I don't think it would hurt if then exercised a little bit more so I put her in touch with [facility]. She will give me an update when I see her  in a year.  She will call for any problems that may develop for her next visit here.  Karris Deangelo C    05/31/2015

## 2015-05-31 NOTE — Telephone Encounter (Signed)
appt made and avs printed °

## 2015-06-01 ENCOUNTER — Ambulatory Visit
Admission: RE | Admit: 2015-06-01 | Discharge: 2015-06-01 | Disposition: A | Discharge status: Home / Self Care | Payer: Medicare Other | Source: Ambulatory Visit | Attending: Oncology | Admitting: Oncology

## 2015-06-01 DIAGNOSIS — Z853 Personal history of malignant neoplasm of breast: Secondary | ICD-10-CM

## 2015-06-01 DIAGNOSIS — R928 Other abnormal and inconclusive findings on diagnostic imaging of breast: Secondary | ICD-10-CM | POA: Diagnosis not present

## 2015-06-07 ENCOUNTER — Other Ambulatory Visit: Payer: Medicare Other

## 2015-06-14 ENCOUNTER — Ambulatory Visit: Payer: Medicare Other | Admitting: Oncology

## 2015-06-24 ENCOUNTER — Ambulatory Visit (INDEPENDENT_AMBULATORY_CARE_PROVIDER_SITE_OTHER): Payer: Medicare Other | Admitting: Family Medicine

## 2015-06-24 VITALS — BP 158/78 | HR 84 | Temp 98.0°F | Resp 16 | Ht 62.0 in | Wt 133.0 lb

## 2015-06-24 DIAGNOSIS — R300 Dysuria: Secondary | ICD-10-CM | POA: Diagnosis not present

## 2015-06-24 DIAGNOSIS — N390 Urinary tract infection, site not specified: Secondary | ICD-10-CM

## 2015-06-24 LAB — POCT URINALYSIS DIP (MANUAL ENTRY)
BILIRUBIN UA: NEGATIVE
BILIRUBIN UA: NEGATIVE
GLUCOSE UA: NEGATIVE
Nitrite, UA: NEGATIVE
PH UA: 5.5
Spec Grav, UA: 1.015
Urobilinogen, UA: 0.2

## 2015-06-24 LAB — POC MICROSCOPIC URINALYSIS (UMFC): Mucus: ABSENT

## 2015-06-24 MED ORDER — CIPROFLOXACIN HCL 500 MG PO TABS: 500.0000 mg | ORAL_TABLET | Freq: Two times a day (BID) | ORAL | Status: DC

## 2015-06-24 NOTE — Patient Instructions (Addendum)
   IF you received an x-ray today, you will receive an invoice from [facility]. Please contact [facility] at [phone] with questions or concerns regarding your invoice.   IF you received labwork today, you will receive an invoice from [facility]. Please contact [facility] at [phone] with questions or concerns regarding your invoice.   Our billing staff will not be able to assist you with questions regarding bills from these companies.  You will be contacted with the lab results as soon as they are available. The fastest way to get your results is to activate your My Chart account. Instructions are located on the last page of this paperwork. If you have not heard from us regarding the results in 2 weeks, please contact this office.     Urinary Tract Infection Urinary tract infections (UTIs) can develop anywhere along your urinary tract. Your urinary tract is your body's drainage system for removing wastes and extra water. Your urinary tract includes two kidneys, two ureters, a bladder, and a urethra. Your kidneys are a pair of bean-shaped organs. Each kidney is about the size of your fist. They are located below your ribs, one on each side of your spine. CAUSES Infections are caused by microbes, which are microscopic organisms, including fungi, viruses, and bacteria. These organisms are so small that they can only be seen through a microscope. Bacteria are the microbes that most commonly cause UTIs. SYMPTOMS  Symptoms of UTIs may vary by age and gender of the patient and by the location of the infection. Symptoms in young women typically include a frequent and intense urge to urinate and a painful, burning feeling in the bladder or urethra during urination. Older women and men are more likely to be tired, shaky, and weak and have muscle aches and abdominal pain. A fever may mean the infection is in your kidneys. Other symptoms of a kidney  infection include pain in your back or sides below the ribs, nausea, and vomiting. DIAGNOSIS To diagnose a UTI, your caregiver will ask you about your symptoms. Your caregiver will also ask you to provide a urine sample. The urine sample will be tested for bacteria and white blood cells. White blood cells are made by your body to help fight infection. TREATMENT  Typically, UTIs can be treated with medication. Because most UTIs are caused by a bacterial infection, they usually can be treated with the use of antibiotics. The choice of antibiotic and length of treatment depend on your symptoms and the type of bacteria causing your infection. HOME CARE INSTRUCTIONS  If you were prescribed antibiotics, take them exactly as your caregiver instructs you. Finish the medication even if you feel better after you have only taken some of the medication.  Drink enough water and fluids to keep your urine clear or pale yellow.  Avoid caffeine, tea, and carbonated beverages. They tend to irritate your bladder.  Empty your bladder often. Avoid holding urine for long periods of time.  Empty your bladder before and after sexual intercourse.  After a bowel movement, women should cleanse from front to back. Use each tissue only once. SEEK MEDICAL CARE IF:   You have back pain.  You develop a fever.  Your symptoms do not begin to resolve within 3 days. SEEK IMMEDIATE MEDICAL CARE IF:   You have severe back pain or lower abdominal pain.  You develop chills.  You have nausea or vomiting.  You have continued burning or discomfort with urination.   MAKE SURE YOU:   Understand these instructions.  Will watch your condition.  Will get help right away if you are not doing well or get worse.   This information is not intended to replace advice given to you by your health care provider. Make sure you discuss any questions you have with your health care provider.   Document Released: 11/22/2004 Document  Revised: 11/03/2014 Document Reviewed: 03/23/2011 Elsevier Interactive Patient Education 2016 [employer]  

## 2015-06-24 NOTE — Progress Notes (Signed)
Subjective:    Patient ID: Alice [name], female    DOB: 08-05-1930, 82 y.o.   MRN: [id number] Chief Complaint  Patient presents with  . Dysuria    possible uti/2 days    HPI  Having burning and stinging while urinating for a wk.  Cranberries, green tea, water.  Feverish yest so on asa.  No abd [name], n/v but decreased appetite.  Back [name] a little worse than normal and urine cloudy.  Thinks she saw blood once but may have been from hemorrhoids.  Past Medical History  Diagnosis Date  . Hypothyroidism   . Breast cancer ([place])      Right , chemo, radiation  . Scoliosis     L4 L5  . History of diverticulitis of colon   . Alopecia   . Hypercholesteremia   . Seasonal allergies   . DDD (degenerative disc disease)   . SUI (stress urinary incontinence, female)   . Heart palpitations   . Insect bites   . History of diverticulitis    Current Outpatient Prescriptions on File Prior to Visit  Medication Sig Dispense Refill  . amLODipine (NORVASC) 5 MG tablet Take one tablet by mouth once daily to control blood pressure 30 tablet 4  . aspirin EC 81 MG tablet Take 81 mg by mouth.    . fish oil-omega-3 fatty acids 1000 MG capsule Take 2 g by mouth 2 (two) times daily.     Marland Kitchen levothyroxine (SYNTHROID) 100 MCG tablet Take 100 mcg by mouth daily before breakfast.     . LORazepam (ATIVAN) 0.5 MG tablet Take 1 tablet (0.5 mg total) by mouth daily as needed for anxiety. 90 tablet 0  . loteprednol (LOTEMAX) 0.5 % ophthalmic suspension Place 1 drop into both eyes as needed.    . Multiple Vitamin (MULTIVITAMIN WITH MINERALS) TABS tablet Take 1 tablet by mouth daily.    . naproxen sodium (ANAPROX) 220 MG tablet Take 220 mg by mouth 2 (two) times daily. Reported on 03/12/2015    . neomycin-bacitracin-polymyxin (NEOSPORIN) ointment Apply 1 application topically daily as needed for wound care. Reported on 03/12/2015    . omeprazole (PRILOSEC) 40 MG capsule Take 1 capsule (40 mg total) by mouth daily. 90  capsule 4  . tamoxifen (NOLVADEX) 20 MG tablet Take 1 tablet (20 mg total) by mouth daily. 90 tablet 2   No current facility-administered medications on file prior to visit.   Allergies  Allergen Reactions  . Amoxicillin Rash  . Diphenhydramine Rash    "It is the red dye in Benadryl that I''m allergic to"  . Naproxen Sodium Rash    "It is the blue dye in naproxen that I'm allergic to."  . Lipitor [Atorvastatin]   . Meclizine Other (See Comments)    Worsening dizziness and blurred vision  . Cefdinir Other (See Comments)    Vertigo and headaches   . Codeine Other (See Comments)    unknown  . Methocarbamol Other (See Comments)    insomnia   . Prednisone Other (See Comments)    vertigo and headaches  . Skelaxin [Metaxalone] Other (See Comments)    Gastric upset.   . Tramadol Nausea Only  . Hctz [Hydrochlorothiazide] Itching      Review of Systems  Constitutional: Positive for fever and diaphoresis. Negative for chills, activity change, appetite change, fatigue and unexpected weight change.  Gastrointestinal: Positive for anal bleeding. Negative for nausea, vomiting, abdominal [name], diarrhea, constipation and blood in stool.  Genitourinary:  Positive for dysuria, hematuria and pelvic [name]. Negative for urgency, frequency, decreased urine volume, vaginal bleeding, vaginal discharge, difficulty urinating, genital sores, vaginal [name], menstrual problem and dyspareunia.  Musculoskeletal: Positive for back [name] and arthralgias. Negative for gait problem.  Skin: Negative for pallor and rash.  Hematological: Negative for adenopathy.  Psychiatric/Behavioral: The patient is not nervous/anxious.        Objective:  BP 158/78 mmHg  Pulse 84  Temp(Src) 98 F (36.7 C) (Oral)  Resp 16  Ht 5\' 2"  (1.575 m)  Wt 133 lb (60.328 kg)  BMI 24.32 kg/m2  SpO2 96%  Physical Exam  Constitutional: She is oriented to person, place, and time. She appears well-developed and well-nourished. No  distress.  HENT:  Head: Normocephalic and atraumatic.  Cardiovascular: Normal rate, regular rhythm, normal heart sounds and intact distal pulses.   Pulmonary/Chest: Effort normal and breath sounds normal.  Abdominal: Soft. Bowel sounds are normal. She exhibits no distension. There is no hepatosplenomegaly. There is no tenderness. There is no rebound, no guarding and no CVA tenderness.  Neurological: She is alert and oriented to person, place, and time.  Skin: Skin is warm and dry. She is not diaphoretic.  Psychiatric: She has a normal mood and affect. Her behavior is normal.      Results for orders placed or performed in visit on 06/24/15  POCT urinalysis dipstick  Result Value Ref Range   Color, UA yellow yellow   Clarity, UA cloudy (A) clear   Glucose, UA negative negative   Bilirubin, UA negative negative   Ketones, POC UA negative negative   Spec Grav, UA 1.015    Blood, UA trace-lysed (A) negative   pH, UA 5.5    Protein Ur, POC =30 (A) negative   Urobilinogen, UA 0.2    Nitrite, UA Negative Negative   Leukocytes, UA large (3+) (A) Negative       Assessment & Plan:   1. Dysuria   2. Urinary tract infection, acute     Orders Placed This Encounter  Procedures  . Urine culture  . POCT urinalysis dipstick  . POCT Microscopic Urinalysis ([facility])    Meds ordered this encounter  Medications  . ciprofloxacin (CIPRO) 500 MG tablet    Sig: Take 1 tablet (500 mg total) by mouth 2 (two) times daily.    Dispense:  10 tablet    Refill:  0    Delman Cheadle, MD MPH

## 2015-06-26 LAB — URINE CULTURE: Colony Count: >=100000

## 2015-06-27 ENCOUNTER — Encounter: Payer: Self-pay | Admitting: Rehabilitative and Restorative Service Providers"

## 2015-06-27 NOTE — Therapy (Signed)
[facility] [address] [place], [place], [address] Phone: [phone]   Fax:  [phone]  Patient Details  Name: Alice [name] MRN: [id number] Date of Birth: 07/12/1930 Referring Provider:  No ref. provider found  Encounter Date: last encounter 05/05/2015  PHYSICAL THERAPY DISCHARGE SUMMARY  Visits from Start of Care: 4  Current functional level related to goals / functional outcomes:       PT Long Term Goals - 05/05/15 1041    PT LONG TERM GOAL #1   Title The patient will be indep with HEP for gaze and balance activities, as indicated.   Baseline Target date 04/20/2015   Time 4   Period Weeks   Status Achieved   PT LONG TERM GOAL #2   Title The patient will have negative L dix hallpike for nystagmus or dizziness indicating resolution of BPPV.   Baseline Met with negative L dix hallpike today.   Time 4   Period Weeks   Status Achieved   PT LONG TERM GOAL #3   Title The patient will decrease DHI by 15% to demo reduction in self perception of dizziness.   Baseline Target date 04/20/2015   Time 4   Period Weeks   Status On-going   PT LONG TERM GOAL #4   Title Further assess balance and goals to follow, as indicated.   Baseline Patient reports intermittent imbalance only when first transitioning to standing or moving quickly.  Related to trace BPPV that has not yet resolved.    Time 4   Period Weeks   Status Achieved        Remaining deficits: At time of last session, patient continued with a trace amount of dizziness.  She was planning to work on HEP and return to clinic if it did not resolve symptoms completely.   Education / Equipment: HEP, self mgmt of BPPV.  Plan: Patient agrees to discharge.  Patient goals were partially met. Patient is being discharged due to meeting the stated rehab goals.  ?????       Thank you for the referral of this patient. Rudell Cobb,  MPT   Corneilus Heggie 06/27/2015, 8:41 AM  [facility] [address] [place], [place], [address] Phone: [phone]   Fax:  [phone]

## 2015-07-12 ENCOUNTER — Other Ambulatory Visit: Payer: Self-pay | Admitting: Oncology

## 2015-07-13 ENCOUNTER — Encounter: Payer: Medicare Other | Admitting: Internal Medicine

## 2015-07-20 ENCOUNTER — Encounter: Payer: Self-pay | Admitting: Internal Medicine

## 2015-07-20 ENCOUNTER — Telehealth: Payer: Self-pay

## 2015-07-20 ENCOUNTER — Encounter: Payer: Medicare Other | Admitting: Internal Medicine

## 2015-07-20 DIAGNOSIS — C50419 Malignant neoplasm of upper-outer quadrant of unspecified female breast: Secondary | ICD-10-CM

## 2015-07-20 MED ORDER — SYNTHROID 100 MCG PO TABS: 100.0000 ug | ORAL_TABLET | Freq: Every day | ORAL | Status: DC

## 2015-07-20 MED ORDER — LORAZEPAM 0.5 MG PO TABS: 0.5000 mg | ORAL_TABLET | Freq: Every day | ORAL | Status: DC | PRN

## 2015-07-20 MED ORDER — ATIVAN 0.5 MG PO TABS: 0.5000 mg | ORAL_TABLET | Freq: Every day | ORAL | Status: DC | PRN

## 2015-07-20 MED ORDER — TETANUS-DIPHTH-ACELL PERTUSSIS 5-2.5-18.5 LF-MCG/0.5 IM SUSP: 0.5000 mL | Freq: Once | INTRAMUSCULAR | Status: DC

## 2015-07-20 NOTE — Telephone Encounter (Signed)
Patient was to be seen in office today for a Annual Exam. Patient had a pet (dog) with her for therapy purposes. Dr.Carter is highly allergic to pets, patient was asked to remove pet from room (while annual exam completed) and refused to do so. Patient preferred to cancel appointment.   Patient's blood pressure was elevated 172/92 when checked by the medical assistant in preparation for yearly check-up (patient was involved in a finder binder on her way to the office and patient stopped B/P medication on her own due to dizziness). Dr.Carter please advise    Management spoke with patient.    Patient requested refills on medications until appointment to be rescheduled with another provider. Pateint would like Brand name Synthroid (30 to [facility] and 90 to mail order) and Ativan.

## 2015-07-21 ENCOUNTER — Telehealth: Payer: Self-pay | Admitting: Internal Medicine

## 2015-07-21 DIAGNOSIS — C50419 Malignant neoplasm of upper-outer quadrant of unspecified female breast: Secondary | ICD-10-CM

## 2015-07-21 NOTE — Telephone Encounter (Signed)
Ok to above

## 2015-07-21 NOTE — Progress Notes (Signed)
This encounter was created in error - please disregard.

## 2015-07-26 MED ORDER — LORAZEPAM 0.5 MG PO TABS: 0.5000 mg | ORAL_TABLET | Freq: Every day | ORAL | Status: DC | PRN

## 2015-07-26 MED ORDER — SYNTHROID 100 MCG PO TABS: 100.0000 ug | ORAL_TABLET | Freq: Every day | ORAL | Status: DC

## 2015-07-26 NOTE — Telephone Encounter (Signed)
Prescription for Lorazepam 0.5 mg tablet, #90, was printed and placed in Dr. Rolly Salter folder for signing. Please fax to [facility] [phone].  Prescription for Synthroid 100 mcg tablet, #90, was electronically sent to [facility] for home delivery.

## 2015-07-26 NOTE — Addendum Note (Signed)
Addended by: Denyse Amass on: 07/26/2015 10:06 AM   Modules accepted: Orders

## 2015-08-11 ENCOUNTER — Other Ambulatory Visit: Payer: Self-pay

## 2015-08-11 ENCOUNTER — Encounter: Payer: Self-pay | Admitting: Internal Medicine

## 2015-08-11 ENCOUNTER — Ambulatory Visit (INDEPENDENT_AMBULATORY_CARE_PROVIDER_SITE_OTHER): Payer: Medicare Other | Admitting: Internal Medicine

## 2015-08-11 ENCOUNTER — Encounter: Payer: Medicare Other | Admitting: Internal Medicine

## 2015-08-11 VITALS — BP 134/78 | HR 70 | Temp 97.5°F | Resp 17

## 2015-08-11 DIAGNOSIS — N390 Urinary tract infection, site not specified: Secondary | ICD-10-CM

## 2015-08-11 DIAGNOSIS — E785 Hyperlipidemia, unspecified: Secondary | ICD-10-CM

## 2015-08-11 DIAGNOSIS — K648 Other hemorrhoids: Secondary | ICD-10-CM

## 2015-08-11 DIAGNOSIS — K644 Residual hemorrhoidal skin tags: Secondary | ICD-10-CM

## 2015-08-11 DIAGNOSIS — K649 Unspecified hemorrhoids: Secondary | ICD-10-CM

## 2015-08-11 DIAGNOSIS — Z0000 Encounter for general adult medical examination without abnormal findings: Secondary | ICD-10-CM

## 2015-08-11 DIAGNOSIS — M4126 Other idiopathic scoliosis, lumbar region: Secondary | ICD-10-CM | POA: Diagnosis not present

## 2015-08-11 DIAGNOSIS — F419 Anxiety disorder, unspecified: Secondary | ICD-10-CM | POA: Diagnosis not present

## 2015-08-11 DIAGNOSIS — M419 Scoliosis, unspecified: Secondary | ICD-10-CM

## 2015-08-11 DIAGNOSIS — C50411 Malignant neoplasm of upper-outer quadrant of right female breast: Secondary | ICD-10-CM

## 2015-08-11 DIAGNOSIS — Z96649 Presence of unspecified artificial hip joint: Secondary | ICD-10-CM | POA: Diagnosis not present

## 2015-08-11 DIAGNOSIS — E038 Other specified hypothyroidism: Secondary | ICD-10-CM

## 2015-08-11 DIAGNOSIS — M436 Torticollis: Secondary | ICD-10-CM | POA: Diagnosis not present

## 2015-08-11 DIAGNOSIS — Z96641 Presence of right artificial hip joint: Secondary | ICD-10-CM

## 2015-08-11 DIAGNOSIS — E034 Atrophy of thyroid (acquired): Secondary | ICD-10-CM | POA: Diagnosis not present

## 2015-08-11 HISTORY — DX: Residual hemorrhoidal skin tags: K64.4

## 2015-08-11 HISTORY — DX: Other hemorrhoids: K64.8

## 2015-08-11 MED ORDER — TETANUS-DIPHTH-ACELL PERTUSSIS 5-2.5-18.5 LF-MCG/0.5 IM SUSP: 0.5000 mL | Freq: Once | INTRAMUSCULAR | Status: DC

## 2015-08-11 MED ORDER — LOTEPREDNOL ETABONATE 0.5 % OP SUSP: 1.0000 [drp] | OPHTHALMIC | Status: DC | PRN

## 2015-08-11 NOTE — Patient Instructions (Signed)
Use topical muscle rubs on your neck and do regular range of motions exercises for your neck.  If that's not getting better, then we can refer you to physical therapy.

## 2015-08-11 NOTE — Progress Notes (Signed)
Location:   [facility]   Place of Service:   clinic Provider: Vlad Mayberry L. Mariea Clonts, D.O., C.M.D.  Patient Care Team: [name], DO as PCP - General (Geriatric Medicine) Mcarthur Rossetti, MD as Consulting Physician (Orthopedic Surgery) Chauncey Cruel, MD as Consulting Physician (Oncology)  Extended Emergency Contact Information Primary Emergency Contact: Winegar,Greg Address: [place] [address] Johnnette Litter of [place] Phone: [phone] Mobile Phone: [phone] Relation: Son Secondary Emergency Contact: Winegar,L. Wray Kearns , [place] of [place] Phone: [phone] Relation: Son  Goals of Care: Advanced Directive information Advanced Directives 08/11/2015  Does patient have an advance directive? Yes  Type of Paramedic of [place];Living will  Does patient want to make changes to advanced directive? -  Copy of advanced directive(s) in chart? Yes   Chief Complaint  Patient presents with  . Medical Management of Chronic Issues    Annual Physical. MMSE was done 07/20/15.      HPI: Patient is a 82 y.o. female seen in today for an annual wellness exam.    Wants an osteopath b/c she wants whole body care and wants to be part of decisions about her care.  Says she is getting very old.  Has some shortness of breath when walking up hill.  Did smoke in the past.  Admits to feistiness.  She exercises by walking Max. Says she is soft, but not sloppy soft.    She has been here since 2008.  Relocated from [place].  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  Had right lumpectomy, chemotherapy which she hated.  Body does not take meds well.  She felt like it was too strong.  Also had radiation treatment.  Dr. Hoxworth/Dr. Magrinat.  Takes pills for 5 yrs.  Last checkups were fine.  Says she really wanted to die during that time.  Sons, friends, neighbors were supportive.   Does go to cancer support meetings.  Now goes to [facility] meetings instead. On tamoxifen.    Hypothyroidism:  Has been on synthroid since 1970.  Dose was reduced lately and seems to have stabilized.    GERD:  Is on PPI omeprazole.  She is on it due to the aleve bid for pain.  She is on asa daily and two fish oil capsules.    Anxiety:  Still has restless legs which are relieved with ativan.  Handles crises as well as ever.  Feels like after effects are worse.    Depression screen [facility] 2/9 08/11/2015 06/24/2015 03/12/2015  Decreased Interest 0 0 0  Down, Depressed, Hopeless 0 0 1  PHQ - 2 Score 0 0 1    Fall Risk  08/11/2015 06/24/2015 04/13/2015 03/12/2015  Falls in the past year? No No No No   MMSE - Mini Mental State Exam 07/20/2015  Orientation to time 5  Orientation to Place 5  Registration 3  Attention/ Calculation 5  Recall 3  Language- name 2 objects 2  Language- repeat 1  Language- follow 3 step command 3  Language- read & follow direction 1  Write a sentence 1  Copy design 1  Total score 30  passed clock  Health Maintenance  Topic Date Due  . TETANUS/TDAP  04/03/1951  . PNA vac Low Risk Adult (1 of 2 - PCV13) 04/02/1997  . INFLUENZA VACCINE  09/27/2015  .  DEXA SCAN  Completed  . ZOSTAVAX  Completed   Urinary incontinence?  Wears a pad, has some dribbles.  If she's amid something, she must finish it, then might have an accident.  Does not drink very much.  If she does, she gets up at night a lot.  Got used to not drinking water in [place] and [place].    Functional Status Survey: Is the patient deaf or have difficulty hearing?: Yes (has small ear canals and has difficulty with female voices) Does the patient have difficulty seeing, even when wearing glasses/contacts?: Yes (does jigsaw puzzles, uses computer and watches tv and strain) Does the patient have difficulty concentrating, remembering, or making decisions?: No Does the patient have difficulty walking or climbing stairs?:  No Does the patient have difficulty dressing or bathing?: No Does the patient have difficulty doing errands alone such as visiting a doctor's office or shopping?: No Exercise?Current Exercise Habits: Home exercise routine, Type of exercise: walking, Time (Minutes): 30, Frequency (Times/Week): 7, Weekly Exercise (Minutes/Week): 210, Intensity: Mild Exercise limited by: orthopedic condition(s);psychological condition(s) Diet?  Is a picky eater.  Mostly fruits, cottage cheese, cheese, a little hamburger now and then.  Tries to eat healthy.  At one time had a weight problem and has been careful since.  Now has a sweet tooth.  Eats bread which she did not eat before    Had eye exam last year and got new glasses.  Has a freckle in her eye.  Dr. Luberta Mutter.   Hearing:  No difficulty Dentition:  No specific problems.  Goes every 9 mos for checkups. Pain:  sometimes thinks she has angina b/c she has left chest pain. Goes away, but not frequent.  Dad had stroke in 73s, mom early too and grandmother's sisters also died early.  Sister had pneumonia.  Brother had a melanoma she believes and was not treated early enough as he was a christian scientist--this is all her impression.  BP not high except during chemotherapy.  Past Medical History  Diagnosis Date  . Hypothyroidism   . Breast cancer ([place])      Right , chemo, radiation  . Scoliosis     L4 L5  . History of diverticulitis of colon   . Alopecia   . Hypercholesteremia   . Seasonal allergies   . DDD (degenerative disc disease)   . SUI (stress urinary incontinence, female)   . Heart palpitations   . Insect bites   . History of diverticulitis     Past Surgical History  Procedure Laterality Date  . Breast lumpectomy  2012    right  . Tonsillectomy  2001    Diverticulitis  . Hernia repair      [facility], Dr. Rise Patience  . Port-a-cath removal    . Cataract extraction      left  . Bladder suspension    . Colon surgery  20 YRS AGO    BOWEL  RESECTION  . Total hip arthroplasty Right 09/10/2014    Procedure: RIGHT TOTAL HIP ARTHROPLASTY ANTERIOR APPROACH;  Surgeon: Mcarthur Rossetti, MD;  Location: [facility];  Service: Orthopedics;  Laterality: Right;  . Joint replacement    . Eye surgery    . Bowel rescetion    . Bowel resection      Social History   Social History  . Marital Status: Divorced    Spouse Name: N/A  . Number of Children: 3  . Years of Education: N/A   Occupational History  .  Retired    Social History Main Topics  . Smoking status: Former Smoker    Quit date: 08/09/1964  . Smokeless tobacco: Never Used  . Alcohol Use: Yes     Comment: RARE  . Drug Use: No  . Sexual Activity: Not on file   Other Topics Concern  . Not on file   Social History Narrative   Tobacco use, amount per day now: N/A      Past tobacco use, amount per day: 1 pack, Quit 1969      How many years did you use tobacco: N/A      Alcohol use (drinks per week): Occassionally. Glass of wine- Holidays      Diet: N/A      Do you drink/eat things with caffeine? Coffee and Tea      Marital status: Divorced             What year were you married?      Do you live in a house, apartment, assisted living, condo, trailer? Condo      Is it one or more stories? One      How many persons live in your home? Self and Service Dog      Do you have any pets in your home? 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 Walking            How often? Usually daily 2-4 times      Do you have a living will? Yes      Do you have a DNR form? N/A            If not, do you want to discuss one? N/A      Do you have signed POA/HPOA forms? No                    Allergies  Allergen Reactions  . Amoxicillin Rash  . Diphenhydramine Rash    "It is the red dye in Benadryl that I''m allergic to"  . Naproxen Sodium Rash    "It is the blue dye in naproxen that I'm allergic to."  .  Lipitor [Atorvastatin]   . Meclizine Other (See Comments)    Worsening dizziness and blurred vision  . Amlodipine Other (See Comments)    Dizzy  . Cefdinir Other (See Comments)    Vertigo and headaches   . Codeine Other (See Comments)    unknown  . Methocarbamol Other (See Comments)    insomnia   . Prednisone Other (See Comments)    vertigo and headaches  . Skelaxin [Metaxalone] Other (See Comments)    Gastric upset.   . Tramadol Nausea Only  . Hctz [Hydrochlorothiazide] Itching      Medication List       This list is accurate as of: 08/11/15  9:46 AM.  Always use your most recent med list.               aspirin EC 81 MG tablet  Take 81 mg by mouth.     fish oil-omega-3 fatty acids 1000 MG capsule  Take 2 g by mouth 2 (two) times daily.     LORazepam 0.5 MG tablet  Commonly known as:  ATIVAN  Take 1 tablet (0.5 mg total) by mouth daily as needed for anxiety.     loteprednol 0.5 % ophthalmic suspension  Commonly  known as:  LOTEMAX  Place 1 drop into both eyes as needed.     multivitamin with minerals Tabs tablet  Take 1 tablet by mouth daily.     neomycin-bacitracin-polymyxin ointment  Commonly known as:  NEOSPORIN  Apply 1 application topically daily as needed for wound care. Reported on 03/12/2015     omeprazole 40 MG capsule  Commonly known as:  PRILOSEC  TAKE 1 CAPSULE DAILY     SYNTHROID 100 MCG tablet  Generic drug:  levothyroxine  Take 1 tablet (100 mcg total) by mouth daily before breakfast. Brand Name     tamoxifen 20 MG tablet  Commonly known as:  NOLVADEX  Take 1 tablet (20 mg total) by mouth daily.     Tdap 5-2.5-18.5 LF-MCG/0.5 injection  Commonly known as:  BOOSTRIX  Inject 0.5 mLs into the muscle once.        Review of Systems:  ROS  Physical Exam: Filed Vitals:   08/11/15 0855  BP: 134/78  Pulse: 70  Temp: 97.5 F (36.4 C)  TempSrc: Oral  Resp: 17   There is no weight on file to calculate BMI. Physical Exam  Labs  reviewed: Basic Metabolic Panel:  Recent Labs  09/03/14 1450 09/11/14 0440 10/21/14 05/24/15 0946  NA 140 139 141 141  K 4.2 3.9 4.3 4.1  CL 103 103  --   --   CO2 30 31  --  29  GLUCOSE 86 105*  --  79  BUN 23* 15 24* 18.5  CREATININE 0.78 0.75 0.7 0.8  CALCIUM 9.3 8.3*  --  9.2  TSH  --   --  0.34*  --    Liver Function Tests:  Recent Labs  10/21/14 05/24/15 0946  AST 18 22  ALT 23 21  ALKPHOS 28 42  BILITOT  --  0.43  PROT  --  6.5  ALBUMIN  --  3.5   No results for input(s): LIPASE, AMYLASE in the last 8760 hours. No results for input(s): AMMONIA in the last 8760 hours. CBC:  Recent Labs  09/13/14 0413 10/21/14 03/12/15 1116 05/24/15 0946  WBC 7.7 5.4 7.7 5.8  NEUTROABS  --   --   --  2.3  HGB 9.5* 13.7 13.9 12.7  HCT 29.4* 42 41.7 38.8  MCV 94.2  --  86.2 88.2  PLT 83* 211  --  184   Lipid Panel:  Recent Labs  10/21/14  CHOL 230*  HDL 73*  LDLCALC 146  TRIG 53   No results found for: HGBA1C  Procedures: EKG:  NSR at 66 bpm, no acute ischemia or infarct  Assessment/Plan 1. Medicare annual wellness visit, subsequent -will give Rx for tdap -due for bone density -need notes from Dr. Allie Bossier  2. Left torticollis -recommended heat, topicals, stretches -did not want PT at this time but will call back if she changes her mind  3. Breast cancer of upper-outer quadrant of right female breast ([place]) -completing tamoxifen course  4. Status post total replacement of right hip -noted  5. Anxiety -continues on ativan for this and has her "service dog" to keep her company -seems a lot of this developed with her breast cancer  6. Hypothyroidism due to acquired atrophy of thyroid -cont current synthroid and f/u labs - TSH; Future  7. Scoliosis of lumbar spine -noted also, has chronic pain in her back   8. Hyperlipidemia - cont to work on diet and exercise, taking fish oil,  - EKG 12-Lead -  Lipid panel; Future  9. Hemorrhoids, unspecified  hemorrhoid type -not actively needing treatment  10. Frequent UTI - noted also, discussed hydration, keeping vaginal area moist and basic hygiene to prevent these  Labs/tests ordered:  Orders Placed This Encounter  Procedures  . Lipid panel    Standing Status: Future     Number of Occurrences:      Standing Expiration Date: 08/26/2015    Order Specific Question:  Has the patient fasted?    Answer:  Yes  . TSH    Standing Status: Future     Number of Occurrences:      Standing Expiration Date: 08/26/2015  . EKG 12-Lead    Next appt:  6 mos med mgt   Linlee Cromie L. Nolyn Swab, D.O. [facility] [address], [place] [address] Cell Phone (Mon-Fri 8am-5pm):  [phone] On Call:  [phone] & follow prompts after 5pm & weekends Office Phone:  [phone] Office Fax:  [phone]

## 2015-08-19 ENCOUNTER — Telehealth: Payer: Self-pay | Admitting: *Deleted

## 2015-08-19 DIAGNOSIS — M436 Torticollis: Secondary | ICD-10-CM

## 2015-08-19 MED ORDER — SYNTHROID 100 MCG PO TABS: 100.0000 ug | ORAL_TABLET | Freq: Every day | ORAL | Status: DC

## 2015-08-19 NOTE — Telephone Encounter (Signed)
Referral place to PT at [facility], Altha Harm, for left torticollis

## 2015-08-19 NOTE — Telephone Encounter (Signed)
Patient called and requested refill of Synthyroid to be faxed to [facility]. Faxed.  Patient also wanted a referral for Physical Therapy for her Neck discomfort. She stated that Dr. Eulas Post told her that if the discomfort continued she could go for PT. Patient wants to go to [facility] and see Altha Harm. Please Advise.

## 2015-08-20 ENCOUNTER — Ambulatory Visit (INDEPENDENT_AMBULATORY_CARE_PROVIDER_SITE_OTHER): Payer: Medicare Other | Admitting: Osteopathic Medicine

## 2015-08-20 ENCOUNTER — Other Ambulatory Visit: Payer: Self-pay | Admitting: Osteopathic Medicine

## 2015-08-20 VITALS — BP 132/78 | HR 68 | Temp 97.6°F | Resp 16

## 2015-08-20 DIAGNOSIS — S80862A Insect bite (nonvenomous), left lower leg, initial encounter: Secondary | ICD-10-CM | POA: Diagnosis not present

## 2015-08-20 DIAGNOSIS — L500 Allergic urticaria: Secondary | ICD-10-CM | POA: Diagnosis not present

## 2015-08-20 DIAGNOSIS — W57XXXA Bitten or stung by nonvenomous insect and other nonvenomous arthropods, initial encounter: Secondary | ICD-10-CM

## 2015-08-20 MED ORDER — MOMETASONE FUROATE 0.1 % EX CREA: 1.0000 "application " | TOPICAL_CREAM | Freq: Every day | CUTANEOUS | Status: DC | PRN

## 2015-08-20 MED ORDER — HYDROXYZINE PAMOATE 25 MG PO CAPS: 25.0000 mg | ORAL_CAPSULE | Freq: Three times a day (TID) | ORAL | Status: DC | PRN

## 2015-08-20 MED ORDER — LIDOCAINE 5 % EX OINT: 1.0000 "application " | TOPICAL_OINTMENT | Freq: Three times a day (TID) | CUTANEOUS | Status: DC | PRN

## 2015-08-20 NOTE — Progress Notes (Signed)
HPI: Alice [name] is a 82 y.o. female who presents to [facility] for chief complaint of:  Chief Complaint  Patient presents with  . Insect Bite    PATIENT REFUSED BEING WEIGHED , PER PT THERE ARE MEDICATIONS NOT LISTED BUT REFUSED TO GIVE ME THE MEDICATIONS      . Context: Patient says small insect bite on left lower leg, not take, appear to be a small gnat . Location: Left anterior lower leg post ankle . Quality: Very itchy, no erythema, no drainage, no significant swelling . Modifying factors: Loratadine and topical steroids over-the-counter have not helped . Assoc signs/symptoms: No fever or chills, no skin erythema, no drainage, normal range of motion in ankle   Past medical, social and family history reviewed: Past Medical History  Diagnosis Date  . Hypothyroidism   . Breast cancer ([place])      Right , chemo, radiation  . Scoliosis     L4 L5  . History of diverticulitis of colon   . Alopecia   . Hypercholesteremia   . Seasonal allergies   . DDD (degenerative disc disease)   . SUI (stress urinary incontinence, female)   . Heart palpitations   . Insect bites   . History of diverticulitis    Past Surgical History  Procedure Laterality Date  . Breast lumpectomy  2012    right  . Tonsillectomy  2001    Diverticulitis  . Hernia repair      [facility], Dr. Rise Patience  . Port-a-cath removal    . Cataract extraction      left  . Bladder suspension    . Colon surgery  20 YRS AGO    BOWEL RESECTION  . Total hip arthroplasty Right 09/10/2014    Procedure: RIGHT TOTAL HIP ARTHROPLASTY ANTERIOR APPROACH;  Surgeon: Mcarthur Rossetti, MD;  Location: [facility];  Service: Orthopedics;  Laterality: Right;  . Joint replacement    . Eye surgery    . Bowel rescetion    . Bowel resection     Social History  Substance Use Topics  . Smoking status: Former Smoker    Quit date: 08/09/1964  . Smokeless tobacco: Never Used  . Alcohol Use: Yes     Comment: RARE    Family History  Problem Relation Age of Onset  . Heart disease Mother   . Heart disease Father   . Cancer Sister 68    lymphoma  . Skin cancer Brother 40    melanoma  . Colon cancer    . CVA Father 40  . Asthma Son   . Asthma Son   . Asthma Son     Current Outpatient Prescriptions  Medication Sig Dispense Refill  . aspirin EC 81 MG tablet Take 81 mg by mouth.    . fish oil-omega-3 fatty acids 1000 MG capsule Take 2 g by mouth 2 (two) times daily.     Marland Kitchen LORazepam (ATIVAN) 0.5 MG tablet Take 1 tablet (0.5 mg total) by mouth daily as needed for anxiety. 90 tablet 0  . loteprednol (LOTEMAX) 0.5 % ophthalmic suspension Place 1 drop into both eyes as needed. 10 mL 5  . Multiple Vitamin (MULTIVITAMIN WITH MINERALS) TABS tablet Take 1 tablet by mouth daily.    Marland Kitchen neomycin-bacitracin-polymyxin (NEOSPORIN) ointment Apply 1 application topically daily as needed for wound care. Reported on 03/12/2015    . omeprazole (PRILOSEC) 40 MG capsule TAKE 1 CAPSULE DAILY 90 capsule 3  . SYNTHROID 100  MCG tablet Take 1 tablet (100 mcg total) by mouth daily before breakfast. Brand Name 90 tablet 1  . tamoxifen (NOLVADEX) 20 MG tablet Take 1 tablet (20 mg total) by mouth daily. 90 tablet 2  . Tdap (BOOSTRIX) 5-2.5-18.5 LF-MCG/0.5 injection Inject 0.5 mLs into the muscle once. 0.5 mL 0   No current facility-administered medications for this visit.   Allergies  Allergen Reactions  . Amoxicillin Rash  . Diphenhydramine Rash    "It is the red dye in Benadryl that I''m allergic to"  . Naproxen Sodium Rash    "It is the blue dye in naproxen that I'm allergic to."  . Lipitor [Atorvastatin]   . Meclizine Other (See Comments)    Worsening dizziness and blurred vision  . Amlodipine Other (See Comments)    Dizzy  . Cefdinir Other (See Comments)    Vertigo and headaches   . Codeine Other (See Comments)    unknown  . Methocarbamol Other (See Comments)    insomnia   . Prednisone Other (See Comments)     vertigo and headaches  . Skelaxin [Metaxalone] Other (See Comments)    Gastric upset.   . Tramadol Nausea Only  . Hctz [Hydrochlorothiazide] Itching      Review of Systems: CONSTITUTIONAL:  No  fever, no chills, No recent illness HEAD/EYES/EARS/NOSE/THROAT: No  headache, no vision change CARDIAC: No  chest pain MUSCULOSKELETAL: No  myalgia/arthralgia SKIN: No  rash/wounds/concerning lesions, insect bite as per history of present illness, positive urticaria HEM/ONC: No  easy bruising/bleeding, No  abnormal lymph node  Exam:  BP 132/78 mmHg  Pulse 68  Temp(Src) 97.6 F (36.4 C) (Oral)  Resp 16  Ht   Wt   SpO2 97% Constitutional: VS see above. General Appearance: alert, well-developed, well-nourished, NAD Respiratory: Normal respiratory effort. Cardiovascular: No lower extremity edema. Musculoskeletal: Gait normal. No clubbing/cyanosis of digits. Range of motion left ankle Skin: Mild edema anterior left lower extremity no significant erythema, no visible insect bite/eschar, patient does have some other small scabs on left lower extremity consistent with healing excoriations/other insect bites per her report. Skin is otherwise warm, dry, intact. No rash/ulcer. No concerning nevi or subq nodules on limited exam.       ASSESSMENT/PLAN: Since medical control with medications as below, patient buys limited use of topical steroids, patient advised on possible sedation effect of antihistamines and do not drive until you know how these medications affect you. Advised on insect bite prevention precautions.   Allergic urticaria - Plan: lidocaine (XYLOCAINE) 5 % ointment, hydrOXYzine (VISTARIL) 25 MG capsule, mometasone (ELOCON) 0.1 % cream  Insect bite    All questions were answered. Visit summary with medication list and pertinent instructions was printed for patient to review. ER/RTC precautions were reviewed with the patient. Return if symptoms worsen or fail to improve, and as  directed for routine followup chronic medical issues.

## 2015-08-20 NOTE — Patient Instructions (Signed)
Insect Bite Mosquitoes, flies, fleas, bedbugs, and many other insects can bite. Insect bites are different from insect stings. A sting is when poison (venom) is injected into the skin. Insect bites can cause pain or itching for a few days, but they are usually not serious. Some insects can spread diseases to people through a bite. SYMPTOMS  Symptoms of an insect bite include:  Itching or pain in the bite area.  Redness and swelling in the bite area.  An open wound (skin ulcer). In many cases, symptoms last for 2-4 days.  DIAGNOSIS  This condition is usually diagnosed based on symptoms and a physical exam. TREATMENT  Treatment is usually not needed for an insect bite. Symptoms often go away on their own. Your health care provider may recommend creams or lotions to help reduce itching. Antibiotic medicines may be prescribed if the bite becomes infected. A tetanus shot may be given in some cases. If you develop an allergic reaction to an insect bite, your health care provider will prescribe medicines to treat the reaction (antihistamines). This is rare. HOME CARE INSTRUCTIONS  Do not scratch the bite area.  Keep the bite area clean and dry. Wash the bite area daily with soap and water as told by your health care provider.  If directed, applyice to the bite area.  Put ice in a plastic bag.  Place a towel between your skin and the bag.  Leave the ice on for 20 minutes, 2-3 times per day.  To help reduce itching and swelling, try applying a baking soda paste, cortisone cream, or calamine lotion to the bite area as told by your health care provider.  Apply or take over-the-counter and prescription medicines only as told by your health care provider.  If you were prescribed an antibiotic medicine, use it as told by your health care provider. Do not stop using the antibiotic even if your condition improves.  Keep all follow-up visits as told by your health care provider. This is  important. PREVENTION   Use insect repellent. The best insect repellents contain:  DEET, picaridin, oil of lemon eucalyptus (OLE), or IR3535.  Higher amounts of an active ingredient.  When you are outdoors, wear clothing that covers your arms and legs.  Avoid opening windows that do not have window screens. SEEK MEDICAL CARE IF:  You have increased redness, swelling, or pain in the bite area.  You have a fever. SEEK IMMEDIATE MEDICAL CARE IF:   You have joint pain.   You have fluid, blood, or pus coming from the bite area.  You have a headache or neck pain.  You have unusual weakness.  You have a rash.  You have chest pain or shortness of breath.  You have abdominal pain, nausea, or vomiting.  You feel unusually tired or sleepy.   This information is not intended to replace advice given to you by your health care provider. Make sure you discuss any questions you have with your health care provider.   Document Released: 03/22/2004 Document Revised: 11/03/2014 Document Reviewed: 06/30/2014 Elsevier Interactive Patient Education 2016 Elsevier Inc.  

## 2015-08-23 MED ORDER — HYDROXYZINE PAMOATE 25 MG PO CAPS: 25.0000 mg | ORAL_CAPSULE | Freq: Three times a day (TID) | ORAL | Status: DC | PRN

## 2015-08-23 NOTE — Addendum Note (Signed)
Addended by: Narda Rutherford on: 08/23/2015 08:14 AM   Modules accepted: Orders

## 2015-08-24 ENCOUNTER — Ambulatory Visit: Payer: Medicare Other | Attending: Internal Medicine | Admitting: Rehabilitative and Restorative Service Providers"

## 2015-08-24 DIAGNOSIS — M542 Cervicalgia: Secondary | ICD-10-CM

## 2015-08-24 DIAGNOSIS — M6281 Muscle weakness (generalized): Secondary | ICD-10-CM | POA: Diagnosis not present

## 2015-08-24 DIAGNOSIS — R293 Abnormal posture: Secondary | ICD-10-CM | POA: Diagnosis not present

## 2015-08-24 NOTE — Patient Instructions (Addendum)
Healthy Back - Shoulder Roll    Stand straight with arms relaxed at sides. Roll shoulders backward continuously. Do __5-10__ times. This exercise can also be done one shoulder at a time.  Copyright  VHI. All rights reserved.   Active Neck Rotation    With head in a comfortable position and chin gently tucked in, rotate head to the right. Hold __5__ seconds. Repeat to the left.  *within tolerable ROM.  Repeat __5__ times. Do _2___ sessions per day.  http://gt2.exer.us/11   Copyright  VHI. All rights reserved.   SLEEPING POSITIONS: Side-Lying in Bed    To maintain positioning in midline, place pillows between knees and under head.  *use towel roll between shoulder and ear in order to keep neck in neutral position.   Copyright  VHI. All rights reserved.

## 2015-08-26 NOTE — Therapy (Signed)
[facility] [address] [place], [place], [address] Phone: [phone]   Fax:  [phone]  Physical Therapy Evaluation  Patient Details  Name: Alice [name] MRN: [id number] Date of Birth: October 11, 1930 Referring Provider: Hollace Kinnier, MD  Encounter Date: 08/24/2015      PT End of Session - 08/26/15 1221    Visit Number 0   Number of Visits 4   Date for PT Re-Evaluation 09/23/15   Authorization Type G code every 10th visit   PT Start Time 1016   PT Stop Time 1100   PT Time Calculation (min) 44 min   Activity Tolerance Patient tolerated treatment well   Behavior During Therapy [facility] for tasks assessed/performed      Past Medical History  Diagnosis Date  . Hypothyroidism   . Breast cancer ([place])      Right , chemo, radiation  . Scoliosis     L4 L5  . History of diverticulitis of colon   . Alopecia   . Hypercholesteremia   . Seasonal allergies   . DDD (degenerative disc disease)   . SUI (stress urinary incontinence, female)   . Heart palpitations   . Insect bites   . History of diverticulitis     Past Surgical History  Procedure Laterality Date  . Breast lumpectomy  2012    right  . Tonsillectomy  2001    Diverticulitis  . Hernia repair      [facility], Dr. Rise Patience  . Port-a-cath removal    . Cataract extraction      left  . Bladder suspension    . Colon surgery  20 YRS AGO    BOWEL RESECTION  . Total hip arthroplasty Right 09/10/2014    Procedure: RIGHT TOTAL HIP ARTHROPLASTY ANTERIOR APPROACH;  Surgeon: Mcarthur Rossetti, MD;  Location: [facility];  Service: Orthopedics;  Laterality: Right;  . Joint replacement    . Eye surgery    . Bowel rescetion    . Bowel resection      There were no vitals filed for this visit.       Subjective Assessment - 08/26/15 1218    Subjective The patient reports onset of neck discomfort that she attributes to having to sleep on the left side due ot hip issues.   She notes L rotation to be the most painful movement.   Pertinent History She has a service dog for orthopedic reasons per her report.   Patient Stated Goals Reduce/eliminate neck discomfort   Currently in Pain? Yes   Pain Score --  "minimal"   Pain Location Neck  radiates into R trapezius region   Pain Orientation Left   Pain Descriptors / Indicators Aching;Sore   Pain Type Acute pain   Pain Onset 1 to 4 weeks ago   Pain Frequency Intermittent   Aggravating Factors  sleeping   Pain Relieving Factors stretching " I don't have too much trouble during the day."            [facility] PT Assessment - 08/26/15 1219    Assessment   Medical Diagnosis neck discomfort/torticollis   Onset Date/Surgical Date --  07/2015   Hand Dominance Right   Prior Therapy known to our clinic from prior PT for neck   Precautions   Precautions None   Balance Screen   Has the patient fallen in the past 6 months No   Has the patient had a decrease in activity level because of a fear  of falling?  No   Is the patient reluctant to leave their home because of a fear of falling?  No   Home Environment   Living Environment Private residence   Living Arrangements Alone   Type of [place] Access Stairs to enter   Entrance Stairs-Number of Steps 2   Entrance Stairs-Rails None   Home Layout One level   Home Equipment None   Prior Function   Level of Independence Independent   Observation/Other Assessments   Focus on Therapeutic Outcomes (FOTO)  75%   Other Surveys  --  neck disability index=18%   Sensation   Light Touch Appears Intact   ROM / Strength   AROM / PROM / Strength AROM   AROM   AROM Assessment Site Shoulder;Cervical   Right/Left Shoulder Right;Left  WFLs   Cervical Flexion full A/ROM with increased pain on left side in lateral facet joints   Cervical Extension full A/ROM with pain when returning to midline   Cervical - Right Side Bend 22 degrees   Cervical - Left Side Bend 18  degrees with discomfort   Cervical - Right Rotation 65 degrees    Cervical - Left Rotation 50 degrees   Strength   Overall Strength Comments WFLs for shoulder flexion and elbow flexion.  Patient has 4/5 shoulder abduction.     Palpation   Palpation comment Tender to palpation L supraspinatus, upper trapezius, and lateral facet joints.  Tightness noted with rotation at C4-C5 lateral facet joints.       THERAPEUTIC EXERCISE: See patient instructions, also educated re: sleeping positions.      PT Education - 08/26/15 1220    Education provided Yes   Education Details HEP: neck stretch rotation, shoulder rolls   Person(s) Educated Patient   Methods Explanation;Demonstration;Handout   Comprehension Verbalized understanding;Returned demonstration          PT Short Term Goals - 08/24/15 1430    PT SHORT TERM GOAL #1   Title STGs=LTGs           PT Long Term Goals - 08/26/15 1221    PT LONG TERM GOAL #1   Title The patient will improve neck disability index from 18% to < or equal to 10%.   Baseline Target date 09/23/2015   Time 4   Period Weeks   PT LONG TERM GOAL #2   Title The paitent will return demo HEP for postural stabilization and stretching.   Baseline Target date 09/23/2015   Time 4   Period Weeks   PT LONG TERM GOAL #3   Title The patient will improve cervical rotation left side from 50 degrees up to 60 degrees.   Baseline Target date 09/23/2015   Time 4   Period Weeks   PT LONG TERM GOAL #4   Title The patient will improve L cervical sidebending from 18 degrees up to 22 degrees.   Baseline Target date 09/23/2015   Time 4   Period Weeks   PT LONG TERM GOAL #5   Title The patient will improve bilateral shoulder abduction strength from 4/5 up to 5/5.   Baseline Target date 09/23/2015   Time 4   Period Weeks               Plan - 08/26/15 1224    Clinical Impression Statement The patient is an 82 year old female known to our clinic from prior treatment  for vertigo.  She presents today with dec'd  neck ROM to left side for rotation and sidebending, as well as pain while sleeping.  PT to address through strengthening/stretching and postural education.   Rehab Potential Good   PT Frequency 2x / week   PT Duration 4 weeks   PT Treatment/Interventions ADLs/Self Care Home Management;Therapeutic activities;Therapeutic exercise;Manual techniques;Patient/family education;Neuromuscular re-education;Cryotherapy;Moist Heat   PT Next Visit Plan check HEP, progress stabilization, manual techniques for L rotation/facet joint   Consulted and Agree with Plan of Care Patient      Patient will benefit from skilled therapeutic intervention in order to improve the following deficits and impairments:  Pain, Decreased mobility, Decreased strength, Hypomobility, Impaired flexibility  Visit Diagnosis: Abnormal posture  Muscle weakness (generalized)  Neck pain     Problem List Patient Active Problem List   Diagnosis Date Noted  . Left torticollis 08/11/2015  . Hypothyroidism due to acquired atrophy of thyroid 08/11/2015  . Hemorrhoids 08/11/2015  . Osteoarthritis of right hip 09/10/2014  . Status post total replacement of right hip 09/10/2014  . Breast cancer of upper-outer quadrant of right female breast ([place]) 01/07/2013  . Arthralgia 12/08/2012  . Anxiety 02/12/2012  . Cat bite of finger 08/10/2011  . Cellulitis 08/10/2011  . Leukocytosis 08/10/2011    Batsheva Stevick, PT 08/26/2015, 12:27 PM  [facility] [address] [place], [place], [address] Phone: [phone]   Fax:  [phone]  Name: Alice [name] MRN: [id number] Date of Birth: [birth year]

## 2015-09-05 ENCOUNTER — Other Ambulatory Visit: Payer: Self-pay

## 2015-09-05 DIAGNOSIS — E785 Hyperlipidemia, unspecified: Secondary | ICD-10-CM

## 2015-09-05 DIAGNOSIS — E034 Atrophy of thyroid (acquired): Secondary | ICD-10-CM

## 2015-09-07 ENCOUNTER — Other Ambulatory Visit: Payer: Medicare Other

## 2015-09-12 ENCOUNTER — Ambulatory Visit: Payer: Medicare Other | Attending: Internal Medicine | Admitting: Rehabilitative and Restorative Service Providers"

## 2015-09-12 DIAGNOSIS — M542 Cervicalgia: Secondary | ICD-10-CM | POA: Diagnosis not present

## 2015-09-12 DIAGNOSIS — M6281 Muscle weakness (generalized): Secondary | ICD-10-CM | POA: Insufficient documentation

## 2015-09-12 DIAGNOSIS — R293 Abnormal posture: Secondary | ICD-10-CM | POA: Diagnosis not present

## 2015-09-12 NOTE — Therapy (Signed)
[facility] [address] [place], [place], [address] Phone: [phone]   Fax:  [phone]  Physical Therapy Treatment  Patient Details  Name: Alice [name] MRN: [id number] Date of Birth: [birth year] Referring Provider: Hollace Kinnier, MD  Encounter Date: 09/12/2015      PT End of Session - 09/12/15 1105    Visit Number 2   Number of Visits 4   Date for PT Re-Evaluation 09/23/15   Authorization Type G code every 10th visit   PT Start Time 1103   PT Stop Time 1145   PT Time Calculation (min) 42 min   Activity Tolerance Patient tolerated treatment well   Behavior During Therapy [facility] for tasks assessed/performed      Past Medical History  Diagnosis Date  . Hypothyroidism   . Breast cancer ([place])      Right , chemo, radiation  . Scoliosis     L4 L5  . History of diverticulitis of colon   . Alopecia   . Hypercholesteremia   . Seasonal allergies   . DDD (degenerative disc disease)   . SUI (stress urinary incontinence, female)   . Heart palpitations   . Insect bites   . History of diverticulitis     Past Surgical History  Procedure Laterality Date  . Breast lumpectomy  2012    right  . Tonsillectomy  2001    Diverticulitis  . Hernia repair      [facility], Dr. Rise Patience  . Port-a-cath removal    . Cataract extraction      left  . Bladder suspension    . Colon surgery  20 YRS AGO    BOWEL RESECTION  . Total hip arthroplasty Right 09/10/2014    Procedure: RIGHT TOTAL HIP ARTHROPLASTY ANTERIOR APPROACH;  Surgeon: Mcarthur Rossetti, MD;  Location: [facility];  Service: Orthopedics;  Laterality: Right;  . Joint replacement    . Eye surgery    . Bowel rescetion    . Bowel resection      There were no vitals filed for this visit.      Subjective Assessment - 09/12/15 1102    Subjective The patient visited her son and left her exercises at his home.  She remembers neck ROM for HEP.  She continues to have neck  discomfort with L rotation and has a burning sensation in L levator region.     Patient Stated Goals Reduce/eliminate neck discomfort   Currently in Pain? Yes   Pain Score --  only in pain with L rotation   Pain Location Neck   Pain Orientation Left   Pain Descriptors / Indicators Aching   Pain Type Acute pain   Pain Onset 1 to 4 weeks ago   Pain Frequency Intermittent   Aggravating Factors  L rotation, sleeping on left due to hip issues   Pain Relieving Factors stretching           [facility] Adult PT Treatment/Exercise - 09/12/15 2130    Self-Care   Self-Care Other Self-Care Comments   Other Self-Care Comments  posture and body mechanics discussed as patient has rounded shoulders  position.   Exercises   Exercises Other Exercises   Other Exercises  supine towel roll stretch for thoracic extension x 2 minutes with bilateral UEs abducted >90 degrees, supine chin tucks with passive stretching into flexion for posterior cervical stretch, P/ROM into rotation with flexion and sidebending within tolerable ROM, standing lateral sidebending, standing shoulder rolls with  visual cues, and seated scapular retraction with red theraband.    Manual Therapy   Manual Therapy Joint mobilization;Soft tissue mobilization;Manual Traction   Joint Mobilization Grade II joint mobilizations cervical spine into left rotation mobilizing R C3-C5 facet joints in anterior direction.   Soft tissue mobilization Contract relax for stretching at the cervical spine and left upper trapezius trigger point release and left levator release   Manual Traction gentle cervical manual traction.                PT Education - 09/12/15 2130    Education provided Yes   Education Details HEP: added towel roll supine stretch, chin tucks seated, and scapular retraction with red theraband   Person(s) Educated Patient   Methods Explanation;Demonstration;Handout   Comprehension Verbalized understanding;Returned demonstration           PT Short Term Goals - 08/24/15 1430    PT SHORT TERM GOAL #1   Title STGs=LTGs           PT Long Term Goals - 09/12/15 1143    PT LONG TERM GOAL #1   Title The patient will improve neck disability index from 18% to < or equal to 10%.   Baseline Target date 09/23/2015   Time 4   Period Weeks   Status On-going   PT LONG TERM GOAL #2   Title The paitent will return demo HEP for postural stabilization and stretching.   Baseline Target date 09/23/2015   Time 4   Period Weeks   Status On-going   PT LONG TERM GOAL #3   Title The patient will improve cervical rotation left side from 50 degrees up to 60 degrees.   Baseline Met on 09/12/2015 impmroving up to 68 degrees of left rotation.   Time 4   Period Weeks   Status Achieved   PT LONG TERM GOAL #4   Title The patient will improve L cervical sidebending from 18 degrees up to 22 degrees.   Baseline Target date 09/23/2015   Time 4   Period Weeks   Status On-going   PT LONG TERM GOAL #5   Title The patient will improve bilateral shoulder abduction strength from 4/5 up to 5/5.   Baseline Target date 09/23/2015   Time 4   Period Weeks   Status On-going               Plan - 09/12/15 2136    Clinical Impression Statement The patient met LTG for ROM rotation.  PT progressing spinal stabilization and postural strengthening activites.  Continue to LTGs.    PT Treatment/Interventions ADLs/Self Care Home Management;Therapeutic activities;Therapeutic exercise;Manual techniques;Patient/family education;Neuromuscular re-education;Cryotherapy;Moist Heat   PT Next Visit Plan Add theraband shoulder abduction, posture re-ed sitting on physioball, scapular retraction and spinal stabilization, neck manual as indicated   Consulted and Agree with Plan of Care Patient      Patient will benefit from skilled therapeutic intervention in order to improve the following deficits and impairments:  Pain, Decreased mobility, Decreased  strength, Hypomobility, Impaired flexibility  Visit Diagnosis: Abnormal posture  Muscle weakness (generalized)  Neck pain     Problem List Patient Active Problem List   Diagnosis Date Noted  . Left torticollis 08/11/2015  . Hypothyroidism due to acquired atrophy of thyroid 08/11/2015  . Hemorrhoids 08/11/2015  . Osteoarthritis of right hip 09/10/2014  . Status post total replacement of right hip 09/10/2014  . Breast cancer of upper-outer quadrant of right female breast ([place]) 01/07/2013  .  Arthralgia 12/08/2012  . Anxiety 02/12/2012  . Cat bite of finger 08/10/2011  . Cellulitis 08/10/2011  . Leukocytosis 08/10/2011    Stetson Pelaez, PT 09/12/2015, 9:37 PM  [facility] [address] [place], [place], [address] Phone: [phone]   Fax:  [phone]  Name: Alice [name] MRN: [id number] Date of Birth: [birth year]

## 2015-09-12 NOTE — Patient Instructions (Signed)
Healthy Back - Shoulder Roll    Stand straight with arms relaxed at sides. Roll shoulders backward continuously. Do __5-10__ times. This exercise can also be done one shoulder at a time.  Copyright  VHI. All rights reserved.   Active Neck Rotation    With head in a comfortable position and chin gently tucked in, rotate head to the right. Hold __5__ seconds. Repeat to the left. *within tolerable ROM.  Repeat __5__ times. Do _2___ sessions per day.  http://[email or web address].exer.us/11   Copyright  VHI. All rights reserved.   SLEEPING POSITIONS: Side-Lying in Bed    To maintain positioning in midline, place pillows between knees and under head. *use towel roll between shoulder and ear in order to keep neck in neutral position.   Copyright  VHI. All rights reserved.   Thoracic Self-Mobilization (Supine)    With rolled towel placed lengthwise at lower ribs level, lie back on towel with arms outstretched. Hold __2 minutes.. Relax. Repeat __1__ times per set. Do _1___ sets per session. Do __2__ sessions per day.  http://orth.exer.us/1001   Copyright  VHI. All rights reserved.   Horizontal Abduction (Resistive Band)    HOLD BAND IN YOUR HANDS WITH THUMBS POINTING OUT (palms up). With arms at shoulder level, keep elbows straight. Pull arms apart and remain sitting up tall.  Hold for 3 seconds and slowly return to starting position. Repeat __10__ times, rest and do 2 sets. Do __2__ sessions per day.  Copyright  VHI. All rights reserved.   Axial Extension (Chin Tuck)    Pull chin in and lengthen back of neck. Hold __3-5__ seconds while counting out loud. Repeat __5-10__ times. Do _2___ sessions per day.  http://gt2.exer.us/450   Copyright  VHI. All rights reserved.

## 2015-09-13 ENCOUNTER — Other Ambulatory Visit: Payer: Medicare Other

## 2015-09-13 DIAGNOSIS — E034 Atrophy of thyroid (acquired): Secondary | ICD-10-CM

## 2015-09-13 DIAGNOSIS — E785 Hyperlipidemia, unspecified: Secondary | ICD-10-CM

## 2015-09-13 DIAGNOSIS — E038 Other specified hypothyroidism: Secondary | ICD-10-CM | POA: Diagnosis not present

## 2015-09-13 LAB — LIPID PANEL
Cholesterol: 228 mg/dL — ABNORMAL HIGH (ref 125–200)
HDL: 79 mg/dL (ref >=46)
LDL Cholesterol: 132 mg/dL — ABNORMAL HIGH (ref <130)
Total CHOL/HDL Ratio: 2.9 Ratio (ref <=5.0)
Triglycerides: 85 mg/dL (ref <150)
VLDL: 17 mg/dL (ref <30)

## 2015-09-13 LAB — TSH: TSH: 1.02 mIU/L

## 2015-09-15 ENCOUNTER — Encounter: Payer: Self-pay | Admitting: *Deleted

## 2015-09-19 ENCOUNTER — Ambulatory Visit: Payer: Medicare Other | Admitting: Rehabilitative and Restorative Service Providers"

## 2015-09-19 DIAGNOSIS — R293 Abnormal posture: Secondary | ICD-10-CM | POA: Diagnosis not present

## 2015-09-19 DIAGNOSIS — M542 Cervicalgia: Secondary | ICD-10-CM | POA: Diagnosis not present

## 2015-09-19 DIAGNOSIS — M6281 Muscle weakness (generalized): Secondary | ICD-10-CM

## 2015-09-19 NOTE — Patient Instructions (Addendum)
Arm / Leg Extension: Alternate (All-Fours)    Raise right arm and opposite leg. Do not arch neck. Repeat ___5_ times per set. Do __2__ sets per session. Do __1-2__ sessions per day.  http://orth.exer.us/111   Copyright  VHI. All rights reserved.   Scapular Retraction (Prone)    Lie with arms at sides. Pinch shoulder blades together and raise arms a few inches from floor.  *while raising arms reach towards feet to depress shoulder blades. Repeat __10__ times per set. Do __2__ sets per session. Do _2___ sessions per day.  http://orth.exer.us/955   Copyright  VHI. All rights reserved.  Row: Mid-Range - Standing    With red band anchored at chest level, pull elbows backward, squeezing shoulder blades together. Keep head and spine neutral. Row _10-15__ times, __2_ times per day.  http://ss.exer.us/291   Copyright  VHI. All rights reserved.  FLEXION: Standing - Stable: Resistance Band (Active)    Stand with right arm at side. Against red resistance band, lift arm forward and up as high as possible, keeping elbow straight. Complete _1__ sets of _8__ repetitions. Perform _2__ sessions per day.  Copyright  VHI. All rights reserved.  ABDUCTION: Standing - Stable: Exercise Band (Active)    Stand, right arm down. Against red resistance band, lift arm out to side and up as high as possible, keeping elbow straight. Complete __1_ sets of ___8 repetitions. Perform _2__ sessions per day.  Copyright  VHI. All rights reserved.

## 2015-09-19 NOTE — Therapy (Signed)
[facility] [address] [place], [place], [address] Phone: [phone]   Fax:  [phone]  Physical Therapy Treatment  Patient Details  Name: Alice [name] MRN: [id number] Date of Birth: 1930/06/06 Referring Provider: Hollace Kinnier, MD  Encounter Date: 09/19/2015      PT End of Session - 09/19/15 1144    Visit Number 3   Number of Visits 4   Date for PT Re-Evaluation 09/23/15   Authorization Type G code every 10th visit   PT Start Time 1140   PT Stop Time 1220   PT Time Calculation (min) 40 min   Activity Tolerance Patient tolerated treatment well   Behavior During Therapy [facility] for tasks assessed/performed      Past Medical History:  Diagnosis Date  . Alopecia   . Breast cancer ([place])     Right , chemo, radiation  . DDD (degenerative disc disease)   . Heart palpitations   . History of diverticulitis   . History of diverticulitis of colon   . Hypercholesteremia   . Hypothyroidism   . Insect bites   . Scoliosis    L4 L5  . Seasonal allergies   . SUI (stress urinary incontinence, female)     Past Surgical History:  Procedure Laterality Date  . BLADDER SUSPENSION    . bowel rescetion    . BOWEL RESECTION    . BREAST LUMPECTOMY  2012   right  . CATARACT EXTRACTION     left  . COLON SURGERY  20 YRS AGO   BOWEL RESECTION  . EYE SURGERY    . HERNIA REPAIR     [facility], Dr. Rise Patience  . JOINT REPLACEMENT    . PORT-A-CATH REMOVAL    . TONSILLECTOMY  2001   Diverticulitis  . TOTAL HIP ARTHROPLASTY Right 09/10/2014   Procedure: RIGHT TOTAL HIP ARTHROPLASTY ANTERIOR APPROACH;  Surgeon: Mcarthur Rossetti, MD;  Location: [facility];  Service: Orthopedics;  Laterality: Right;    There were no vitals filed for this visit.      Subjective Assessment - 09/19/15 1138    Subjective The patient reports that minimal discomfort in the L scapular region.  She still continues with neck pain mostly when she is  asleep (hurts when she wakes up).   Patient Stated Goals Reduce/eliminate neck discomfort   Pain Score 1   "a little discomfort"   Pain Location Neck   Pain Orientation Left   Pain Descriptors / Indicators Aching   Pain Type Acute pain   Pain Onset 1 to 4 weeks ago   Pain Frequency Intermittent   Aggravating Factors  sleeping on left side   Pain Relieving Factors stretching             [facility] Adult PT Treatment/Exercise - 09/19/15 1424      Self-Care   Self-Care Other Self-Care Comments   Other Self-Care Comments  Discussed posture while walking her service dog and maintaining good scapular retraction when holding leash.  Patient able to demo correct alignment.      Exercises   Exercises Neck;Shoulder     Neck Exercises: Theraband   Scapula Retraction 10 reps   Scapula Retraction Limitations mid range rows standing   Horizontal ABduction 10 reps   Horizontal ABduction Limitations red theraband x 10 R/L sides with cues on posture to midrange   Other Theraband Exercises standing shoulder flexion 10 reps with red theraband to midrange R and L sides.  Neck Exercises: Seated   Other Seated Exercise Physioball sitting with rows with theraband and seated shoulder flexion, abduction and diagonal ROM with cues on posture and scapular stabilization.     Manual Therapy   Manual Therapy Soft tissue mobilization;Manual Traction   Soft tissue mobilization R sidelying L scapular mobility with soft tissue mobilization of upper trap, rhomboids and scalenes.     Manual Traction supine neck distraction with reported reduction of symptoms.                  PT Education - 09/19/15 1218    Education provided Yes   Education Details HEP: added rows mid-range, shoulder flexion, shoulder abduction, quadriped UE/LE, prone scapular retraction   Person(s) Educated Patient   Methods Explanation;Demonstration;Handout   Comprehension Returned demonstration;Verbalized understanding           PT Short Term Goals - 08/24/15 1430      PT SHORT TERM GOAL #1   Title STGs=LTGs           PT Long Term Goals - 09/12/15 1143      PT LONG TERM GOAL #1   Title The patient will improve neck disability index from 18% to < or equal to 10%.   Baseline Target date 09/23/2015   Time 4   Period Weeks   Status On-going     PT LONG TERM GOAL #2   Title The paitent will return demo HEP for postural stabilization and stretching.   Baseline Target date 09/23/2015   Time 4   Period Weeks   Status On-going     PT LONG TERM GOAL #3   Title The patient will improve cervical rotation left side from 50 degrees up to 60 degrees.   Baseline Met on 09/12/2015 impmroving up to 68 degrees of left rotation.   Time 4   Period Weeks   Status Achieved     PT LONG TERM GOAL #4   Title The patient will improve L cervical sidebending from 18 degrees up to 22 degrees.   Baseline Target date 09/23/2015   Time 4   Period Weeks   Status On-going     PT LONG TERM GOAL #5   Title The patient will improve bilateral shoulder abduction strength from 4/5 up to 5/5.   Baseline Target date 09/23/2015   Time 4   Period Weeks   Status On-going               Plan - 09/19/15 1423    Clinical Impression Statement PT emphasized establishing progression of strengthening for postural stabilizers for HEP using theraband for resistance.   Patient continues with mild discomfort in L upper trap and pain worse in the morning.    PT Treatment/Interventions ADLs/Self Care Home Management;Therapeutic activities;Therapeutic exercise;Manual techniques;Patient/family education;Neuromuscular re-education;Cryotherapy;Moist Heat   PT Next Visit Plan Check HEP, check LTGs, determine renew vs d/c   Consulted and Agree with Plan of Care Patient      Patient will benefit from skilled therapeutic intervention in order to improve the following deficits and impairments:  Pain, Decreased mobility, Decreased  strength, Hypomobility, Impaired flexibility  Visit Diagnosis: Abnormal posture  Muscle weakness (generalized)  Neck pain     Problem List Patient Active Problem List   Diagnosis Date Noted  . Left torticollis 08/11/2015  . Hypothyroidism due to acquired atrophy of thyroid 08/11/2015  . Hemorrhoids 08/11/2015  . Osteoarthritis of right hip 09/10/2014  . Status post total replacement of right hip  09/10/2014  . Breast cancer of upper-outer quadrant of right female breast ([place]) 01/07/2013  . Arthralgia 12/08/2012  . Anxiety 02/12/2012  . Cat bite of finger 08/10/2011  . Cellulitis 08/10/2011  . Leukocytosis 08/10/2011    Jozette Castrellon, PT 09/19/2015, 2:36 PM  [facility] [address] [place], [place], [address] Phone: [phone]   Fax:  [phone]  Name: Alice [name] MRN: [id number] Date of Birth: [birth year]

## 2015-09-20 DIAGNOSIS — Z96641 Presence of right artificial hip joint: Secondary | ICD-10-CM | POA: Diagnosis not present

## 2015-09-20 DIAGNOSIS — M25551 Pain in right hip: Secondary | ICD-10-CM | POA: Diagnosis not present

## 2015-09-26 ENCOUNTER — Encounter: Payer: Self-pay | Admitting: Rehabilitative and Restorative Service Providers"

## 2015-09-26 ENCOUNTER — Ambulatory Visit: Payer: Medicare Other | Admitting: Rehabilitative and Restorative Service Providers"

## 2015-09-26 DIAGNOSIS — M542 Cervicalgia: Secondary | ICD-10-CM | POA: Diagnosis not present

## 2015-09-26 DIAGNOSIS — R293 Abnormal posture: Secondary | ICD-10-CM

## 2015-09-26 DIAGNOSIS — M6281 Muscle weakness (generalized): Secondary | ICD-10-CM

## 2015-09-26 NOTE — Therapy (Signed)
[facility] [address] [place], [place], [address] Phone: [phone]   Fax:  [phone]  Physical Therapy Treatment  Patient Details  Name: Alice [name] MRN: [id number] Date of Birth: September 27, 1930 Referring Provider: Hollace Kinnier, MD  Encounter Date: 09/26/2015      PT End of Session - 09/26/15 1358    Visit Number 4   Number of Visits 4   Date for PT Re-Evaluation 09/23/15   Authorization Type G code every 10th visit   PT Start Time 1150   PT Stop Time 1230   PT Time Calculation (min) 40 min   Activity Tolerance Patient tolerated treatment well   Behavior During Therapy [facility] for tasks assessed/performed      Past Medical History:  Diagnosis Date  . Alopecia   . Breast cancer ([place])     Right , chemo, radiation  . DDD (degenerative disc disease)   . Heart palpitations   . History of diverticulitis   . History of diverticulitis of colon   . Hypercholesteremia   . Hypothyroidism   . Insect bites   . Scoliosis    L4 L5  . Seasonal allergies   . SUI (stress urinary incontinence, female)     Past Surgical History:  Procedure Laterality Date  . BLADDER SUSPENSION    . bowel rescetion    . BOWEL RESECTION    . BREAST LUMPECTOMY  2012   right  . CATARACT EXTRACTION     left  . COLON SURGERY  20 YRS AGO   BOWEL RESECTION  . EYE SURGERY    . HERNIA REPAIR     [facility], Dr. Rise Patience  . JOINT REPLACEMENT    . PORT-A-CATH REMOVAL    . TONSILLECTOMY  2001   Diverticulitis  . TOTAL HIP ARTHROPLASTY Right 09/10/2014   Procedure: RIGHT TOTAL HIP ARTHROPLASTY ANTERIOR APPROACH;  Surgeon: Mcarthur Rossetti, MD;  Location: [facility];  Service: Orthopedics;  Laterality: Right;    There were no vitals filed for this visit.      Subjective Assessment - 09/26/15 1151    Subjective The patient woke up last week with stiffness in the right hip and limited hip flexor abilities.  She took muscle relaxer 1-2x/day  and felt "funny" taking the muscle relaxers and stopped.  She also increased her Aleve.  She reports she had a near fall in the bathtub today and caught herself with her right arm.   Pertinent History She has a service dog for orthopedic reasons per her report.   Patient Stated Goals Reduce/eliminate neck discomfort   Currently in Pain? No/denies              [facility] Adult PT Treatment/Exercise - 09/26/15 1353      Exercises   Exercises Other Exercises   Other Exercises  The patient and PT reviewed all prior HEP and divided exerises into 2 groups for long term performance.  See prior HEP for activities.                   PT Short Term Goals - 08/24/15 1430      PT SHORT TERM GOAL #1   Title STGs=LTGs           PT Long Term Goals - 09/26/15 1155      PT LONG TERM GOAL #1   Title The patient will improve neck disability index from 18% to < or equal to 10%.   Baseline Reduced  from 18% down to 4% on NDI.   Time 4   Period Weeks   Status Achieved     PT LONG TERM GOAL #2   Title The paitent will return demo HEP for postural stabilization and stretching.   Baseline Met on 10-16-2015   Time 4   Period Weeks   Status Achieved     PT LONG TERM GOAL #3   Title The patient will improve cervical rotation left side from 50 degrees up to 60 degrees.   Baseline Met on 09/12/2015 impmroving up to 68 degrees of left rotation.   Time 4   Period Weeks   Status Achieved     PT LONG TERM GOAL #4   Title The patient will improve L cervical sidebending from 18 degrees up to 22 degrees.   Baseline L sidebending improved from 18 degrees up to 28 degrees today.   Time 4   Period Weeks   Status Achieved     PT LONG TERM GOAL #5   Title The patient will improve bilateral shoulder abduction strength from 4/5 up to 5/5.   Baseline Met on 2015/10/16   Time 4   Period Weeks   Status Achieved               Plan - Oct 16, 2015 1356    Clinical Impression Statement The  patient met all LTGs and significantly improved her neck disability index from 18% to 4% demonstrating lowered self perception of disability from neck discomfort.  Patient is able to continue HEP after d/c from PT and demonstrates correct technqiue.     PT Treatment/Interventions ADLs/Self Care Home Management;Therapeutic activities;Therapeutic exercise;Manual techniques;Patient/family education;Neuromuscular re-education;Cryotherapy;Moist Heat   PT Next Visit Plan discharge today.   Consulted and Agree with Plan of Care Patient      Patient will benefit from skilled therapeutic intervention in order to improve the following deficits and impairments:  Pain, Decreased mobility, Decreased strength, Hypomobility, Impaired flexibility  Visit Diagnosis: Abnormal posture  Muscle weakness (generalized)  Neck pain       G-Codes - 10-16-2015 1405    Functional Assessment Tool Used neck disability index=4%   Functional Limitation Self care   Self Care Goal Status (R9458) At least 1 percent but less than 20 percent impaired, limited or restricted   Self Care Discharge Status [phone]) At least 1 percent but less than 20 percent impaired, limited or restricted      Problem List Patient Active Problem List   Diagnosis Date Noted  . Left torticollis 08/11/2015  . Hypothyroidism due to acquired atrophy of thyroid 08/11/2015  . Hemorrhoids 08/11/2015  . Osteoarthritis of right hip 09/10/2014  . Status post total replacement of right hip 09/10/2014  . Breast cancer of upper-outer quadrant of right female breast ([place]) 01/07/2013  . Arthralgia 12/08/2012  . Anxiety 02/12/2012  . Cat bite of finger 08/10/2011  . Cellulitis 08/10/2011  . Leukocytosis 08/10/2011    Jowana Thumma, PT 10/16/15, 2:05 PM  [facility] [address] [place], [place], [address] Phone: [phone]   Fax:  [phone]  Name: JAYELLE PAGE MRN:  [id number] Date of Birth: 10/16/1930

## 2015-09-26 NOTE — Patient Instructions (Signed)
GROUP 1 3 DAYS/WEEK: Healthy Back - Shoulder Roll    Stand straight with arms relaxed at sides. Roll shoulders backward continuously. Do __5-10__ times. This exercise can also be done one shoulder at a time.  Copyright  VHI. All rights reserved.   Active Neck Rotation    With head in a comfortable position and chin gently tucked in, rotate head to the right. Hold __5__ seconds. Repeat to the left. *within tolerable ROM.  Repeat __5__ times. Do _2___ sessions per day.  http://[email or web address].exer.us/11   Copyright  VHI. All rights reserved.   SLEEPING POSITIONS: Side-Lying in Bed    To maintain positioning in midline, place pillows between knees and under head. *use towel roll between shoulder and ear in order to keep neck in neutral position.   Copyright  VHI. All rights reserved.   Thoracic Self-Mobilization (Supine)    With rolled towel placed lengthwise at lower ribs level, lie back on towel with arms outstretched. Hold __2 minutes.. Relax. Repeat __1__ times per set. Do _1___ sets per session. Do __2__ sessions per day.  http://orth.exer.us/1001   Copyright  VHI. All rights reserved.    Supine Push    Take a deep breath and exhale while pushing the back of neck down on the bed. Hold ____ seconds. Repeat ____ times. Do ____ sessions per day.  http://[email or web address].exer.us/1   Copyright  VHI. All rights reserved.  Arm / Leg Extension: Alternate (All-Fours)    Raise right arm and opposite leg. Do not arch neck. Repeat ___5_ times per set. Do __2__ sets per session. Do __1-2__ sessions per day.  http://orth.exer.us/111   Copyright  VHI. All rights reserved.            GROUP 2: STRENGTHENING EXERCISES  Scapular Retraction (Prone)    Lie with arms at sides. Pinch shoulder blades together and raise arms a few inches from floor.  *while raising arms reach towards feet to depress shoulder blades. Repeat __10__ times per set. Do __2__ sets per session.  Do _2___ sessions per day.  http://orth.exer.us/955   Copyright  VHI. All rights reserved.  Horizontal Abduction (Resistive Band)    HOLD BAND IN YOUR HANDS WITH THUMBS POINTING OUT (palms up). With arms at shoulder level, keep elbows straight. Pull arms apart and remain sitting up tall.  Hold for 3 seconds and slowly return to starting position. Repeat __10__ times, rest and do 2 sets. Do __2__ sessions per day.  Copyright  VHI. All rights reserved.     Row: Mid-Range - Standing    With red band anchored at chest level, pull elbows backward, squeezing shoulder blades together. Keep head and spine neutral. Row _10-15__ times, __2_ times per day.  http://ss.exer.us/291   Copyright  VHI. All rights reserved.  FLEXION: Standing - Stable: Resistance Band (Active)    Stand with right arm at side. Against red resistance band, lift arm forward and up as high as possible, keeping elbow straight. Complete _1__ sets of _8__ repetitions. Perform _2__ sessions per day.  Copyright  VHI. All rights reserved.  ABDUCTION: Standing - Stable: Exercise Band (Active)    Stand, right arm down. Against red resistance band, lift arm out to side and up as high as possible, keeping elbow straight. Complete __1_ sets of ___8 repetitions. Perform _2__ sessions per day.  Copyright  VHI. All rights reserved.    [place], PT7/24/2017 2:37 PM Signed [facility] [address] [place], [place], [address] Phone: [phone]  Fax:  [phone]

## 2015-09-26 NOTE — Therapy (Signed)
[facility] [address] [place], [place], [address] Phone: [phone]   Fax:  [phone]  Patient Details  Name: Alice [name] MRN: [id number] Date of Birth: 08-May-1930 Referring Provider:  Jose Persia, MD  Encounter Date: 09/26/2015  PHYSICAL THERAPY DISCHARGE SUMMARY  Visits from Start of Care: 4  Current functional level related to goals / functional outcomes:     PT Short Term Goals - 08/24/15 1430      PT SHORT TERM GOAL #1   Title STGs=LTGs         PT Long Term Goals - 09/26/15 1155      PT LONG TERM GOAL #1   Title The patient will improve neck disability index from 18% to < or equal to 10%.   Baseline Reduced from 18% down to 4% on NDI.   Time 4   Period Weeks   Status Achieved     PT LONG TERM GOAL #2   Title The paitent will return demo HEP for postural stabilization and stretching.   Baseline Met on 09/26/2015   Time 4   Period Weeks   Status Achieved     PT LONG TERM GOAL #3   Title The patient will improve cervical rotation left side from 50 degrees up to 60 degrees.   Baseline Met on 09/12/2015 impmroving up to 68 degrees of left rotation.   Time 4   Period Weeks   Status Achieved     PT LONG TERM GOAL #4   Title The patient will improve L cervical sidebending from 18 degrees up to 22 degrees.   Baseline L sidebending improved from 18 degrees up to 28 degrees today.   Time 4   Period Weeks   Status Achieved     PT LONG TERM GOAL #5   Title The patient will improve bilateral shoulder abduction strength from 4/5 up to 5/5.   Baseline Met on 09/26/2015   Time 4   Period Weeks   Status Achieved        Remaining deficits: Intermittent stiffness addressed through HEP.   Education / Equipment: HEP for continued posture and stretching.  Plan: Patient agrees to discharge.  Patient goals were met. Patient is being discharged due to meeting the stated rehab goals.  ?????         Thank you for the referral of this patient. Rudell Cobb, MPT  [place] Dobosz 09/26/2015, 2:06 PM  [facility] [address] [place], [place], [address] Phone: [phone]   Fax:  [phone]

## 2015-09-27 DIAGNOSIS — Z96641 Presence of right artificial hip joint: Secondary | ICD-10-CM | POA: Diagnosis not present

## 2015-09-27 DIAGNOSIS — M25551 Pain in right hip: Secondary | ICD-10-CM | POA: Diagnosis not present

## 2015-10-21 DIAGNOSIS — H2511 Age-related nuclear cataract, right eye: Secondary | ICD-10-CM | POA: Diagnosis not present

## 2015-10-21 DIAGNOSIS — D3132 Benign neoplasm of left choroid: Secondary | ICD-10-CM | POA: Diagnosis not present

## 2015-10-21 DIAGNOSIS — H52203 Unspecified astigmatism, bilateral: Secondary | ICD-10-CM | POA: Diagnosis not present

## 2015-10-21 DIAGNOSIS — H25011 Cortical age-related cataract, right eye: Secondary | ICD-10-CM | POA: Diagnosis not present

## 2015-11-08 ENCOUNTER — Other Ambulatory Visit: Payer: Self-pay | Admitting: Internal Medicine

## 2015-11-08 DIAGNOSIS — C50419 Malignant neoplasm of upper-outer quadrant of unspecified female breast: Secondary | ICD-10-CM

## 2015-11-09 MED ORDER — LORAZEPAM 0.5 MG PO TABS: ORAL_TABLET | ORAL | 0 refills | Status: DC

## 2015-11-09 NOTE — Telephone Encounter (Signed)
Spoke with patient fax 90 day supply to [facility]. She usual gets it from [facility] put it takes too long.

## 2015-11-24 DIAGNOSIS — H2511 Age-related nuclear cataract, right eye: Secondary | ICD-10-CM | POA: Diagnosis not present

## 2015-11-24 DIAGNOSIS — H25811 Combined forms of age-related cataract, right eye: Secondary | ICD-10-CM | POA: Diagnosis not present

## 2015-11-24 DIAGNOSIS — H25011 Cortical age-related cataract, right eye: Secondary | ICD-10-CM | POA: Diagnosis not present

## 2016-02-02 ENCOUNTER — Other Ambulatory Visit: Payer: Self-pay | Admitting: Internal Medicine

## 2016-02-02 MED ORDER — LORAZEPAM 0.5 MG PO TABS: ORAL_TABLET | ORAL | 0 refills | Status: DC

## 2016-02-09 ENCOUNTER — Encounter: Payer: Self-pay | Admitting: Internal Medicine

## 2016-02-09 ENCOUNTER — Ambulatory Visit (INDEPENDENT_AMBULATORY_CARE_PROVIDER_SITE_OTHER): Payer: Medicare Other | Admitting: Internal Medicine

## 2016-02-09 VITALS — BP 132/82 | HR 76 | Temp 97.8°F | Resp 18

## 2016-02-09 DIAGNOSIS — K649 Unspecified hemorrhoids: Secondary | ICD-10-CM | POA: Diagnosis not present

## 2016-02-09 DIAGNOSIS — M79669 Pain in unspecified lower leg: Secondary | ICD-10-CM | POA: Diagnosis not present

## 2016-02-09 DIAGNOSIS — Z79810 Long term (current) use of selective estrogen receptor modulators (SERMs): Secondary | ICD-10-CM

## 2016-02-09 DIAGNOSIS — E034 Atrophy of thyroid (acquired): Secondary | ICD-10-CM

## 2016-02-09 DIAGNOSIS — M436 Torticollis: Secondary | ICD-10-CM

## 2016-02-09 DIAGNOSIS — Z5181 Encounter for therapeutic drug level monitoring: Secondary | ICD-10-CM | POA: Diagnosis not present

## 2016-02-09 DIAGNOSIS — E2839 Other primary ovarian failure: Secondary | ICD-10-CM | POA: Diagnosis not present

## 2016-02-09 NOTE — Patient Instructions (Signed)
Kegel Exercises  The goal of Kegel exercises is to isolate and exercise your pelvic floor muscles. These muscles act as a hammock that supports the rectum, vagina, small intestine, and uterus. As the muscles weaken, the hammock sags and these organs are displaced from their normal positions. Kegel exercises can strengthen your pelvic floor muscles and help you to improve bladder and bowel control, improve sexual response, and help reduce many problems and some discomfort during pregnancy. Kegel exercises can be done anywhere and at any time.  HOW TO PERFORM KEGEL EXERCISES  1. Locate your pelvic floor muscles. To do this, squeeze (contract) the muscles that you use when you try to stop the flow of urine. You will feel a tightness in the vaginal area (women) and a tight lift in the rectal area (men and women).  2. When you begin, contract your pelvic muscles tight for 2-5 seconds, then relax them for 2-5 seconds. This is one set. Do 4-5 sets with a short pause in between.  3. Contract your pelvic muscles for 8-10 seconds, then relax them for 8-10 seconds. Do 4-5 sets. If you cannot contract your pelvic muscles for 8-10 seconds, try 5-7 seconds and work your way up to 8-10 seconds. Your goal is 4-5 sets of 10 contractions each day.  Keep your stomach, buttocks, and legs relaxed during the exercises. Perform sets of both short and long contractions. Vary your positions. Perform these contractions 3-4 times per day. Perform sets while you are:    · Lying in bed in the morning.  · Standing at lunch.  · Sitting in the late afternoon.  · Lying in bed at night.   You should do 40-50 contractions per day. Do not perform more Kegel exercises per day than recommended. Overexercising can cause muscle fatigue. Continue these exercises for for at least 15-20 weeks or as directed by your caregiver.     This information is not intended to replace advice given to you by your  health care provider. Make sure you discuss any questions you have with your health care provider.     Document Released: 01/30/2012 Document Revised: 03/05/2014 Document Reviewed: 01/02/2015  Elsevier Interactive Patient Education ©2017 [employer]

## 2016-02-09 NOTE — Progress Notes (Signed)
Location:  [facility] clinic Provider:  Oriya Kettering L. Mariea Clonts, D.O., C.M.D.  Code Status: DNR  Goals of Care:  Advanced Directives 02/09/2016  Does Patient Have a Medical Advance Directive? Yes  Type of Advance Directive [place]  Does patient want to make changes to medical advance directive? -  Copy of [place] in Chart? No - copy requested     Chief Complaint  Patient presents with  . Medical Management of Chronic Issues    6 month medication management. Discuss DNR  . Other    Wants flu vaccine.     HPI: Patient is a 82 y.o. female seen today for medical management of chronic diseases.    She has three sons.  Her oldest is the executor, middle is in charge of healthcare and youngest takes care of her current finances.  Middle lives in [place].    Hemorrhoids.   Had a bowel resection in 2003.  She has never followed up on the hemorrhoids.  Not just releasing gas when she has gas.  She then gets diaper rash/chafing.  BMs and hemorrhoids are coming out.  The hemorrhoids are bleeding.  Poise pads may be adding to irritation.  If she waits to do one more thing, it goes down her legs.   BMs are typically pretty firm.  Sometimes they are a little looser and then harder to control.  Says that she has large pieces of stool since the resection.  She has tried preparation H and sticking them back in.  Dr. Excell Seltzer may need to take care of them.  They are bleeding more and more often.    Had urticaria from bee stings.  Knew she had some allergy.  Had hard little blisters at that time on the left leg June of this year.    Torticollis: doing much better after seeing Rudell Cobb.  Had seen her for vertigo.  A little spot that comes and goes on her left trapezius area.    Going to see Dr. Ninfa Linden (ortho) about severe pain on her right shin that awakens her (she crosses her shins).  It made her want to scream out at night.  Happens in both legs and not  simultaneously.  Relieved with walking.    She had her 5 year anniversary visit with Dr. Jana Hakim.  She's on an annual visit schedule.  She is ongoing with tamoxifen for a 10 year schedule.    Takes mvi for seniors, but does not take vitamin D or calcium supplement.    Urine is running down her legs.  Has not tried kegels.  She is not a good drinker.  Admits to a couple of cups of coffee per day and bottled tea.  Does also drink fruit juices and as little water as possible.    Past Medical History:  Diagnosis Date  . Alopecia   . Breast cancer ([place])     Right , chemo, radiation  . DDD (degenerative disc disease)   . Heart palpitations   . History of diverticulitis   . History of diverticulitis of colon   . Hypercholesteremia   . Hypothyroidism   . Insect bites   . Scoliosis    L4 L5  . Seasonal allergies   . SUI (stress urinary incontinence, female)     Past Surgical History:  Procedure Laterality Date  . BLADDER SUSPENSION    . bowel rescetion    . BOWEL RESECTION    . BREAST  LUMPECTOMY  2012   right  . CATARACT EXTRACTION     left  . COLON SURGERY  20 YRS AGO   BOWEL RESECTION  . EYE SURGERY    . HERNIA REPAIR     [facility], Dr. Rise Patience  . JOINT REPLACEMENT    . PORT-A-CATH REMOVAL    . TONSILLECTOMY  2001   Diverticulitis  . TOTAL HIP ARTHROPLASTY Right 09/10/2014   Procedure: RIGHT TOTAL HIP ARTHROPLASTY ANTERIOR APPROACH;  Surgeon: Mcarthur Rossetti, MD;  Location: [facility];  Service: Orthopedics;  Laterality: Right;    Allergies  Allergen Reactions  . Amoxicillin Rash  . Diphenhydramine Rash    "It is the red dye in Benadryl that I''m allergic to"  . Naproxen Sodium Rash    "It is the blue dye in naproxen that I'm allergic to."  . Lipitor [Atorvastatin]   . Meclizine Other (See Comments)    Worsening dizziness and blurred vision  . Amlodipine Other (See Comments)    Dizzy  . Cefdinir Other (See Comments)    Vertigo and headaches   . Codeine Other (See  Comments)    unknown  . Methocarbamol Other (See Comments)    insomnia   . Prednisone Other (See Comments)    vertigo and headaches  . Skelaxin [Metaxalone] Other (See Comments)    Gastric upset.   . Tramadol Nausea Only  . Hctz [Hydrochlorothiazide] Itching      Medication List       Accurate as of 02/09/16  3:07 PM. Always use your most recent med list.          aspirin EC 81 MG tablet Take 81 mg by mouth.   fish oil-omega-3 fatty acids 1000 MG capsule Take 2 g by mouth 2 (two) times daily.   hydrOXYzine 25 MG capsule Commonly known as:  VISTARIL Take 1-2 capsules (25-50 mg total) by mouth 3 (three) times daily as needed for itching.   lidocaine 5 % ointment Commonly known as:  XYLOCAINE Apply 1 application topically 3 (three) times daily as needed. For skin pain   LORazepam 0.5 MG tablet Commonly known as:  ATIVAN Take one tablet daily as needed   loteprednol 0.5 % ophthalmic suspension Commonly known as:  LOTEMAX Place 1 drop into both eyes as needed.   mometasone 0.1 % cream Commonly known as:  ELOCON Apply 1 application topically daily as needed. For severe itching   multivitamin with minerals Tabs tablet Take 1 tablet by mouth daily.   neomycin-bacitracin-polymyxin ointment Commonly known as:  NEOSPORIN Apply 1 application topically daily as needed for wound care. Reported on 03/12/2015   omeprazole 40 MG capsule Commonly known as:  PRILOSEC TAKE 1 CAPSULE DAILY   SYNTHROID 100 MCG tablet Generic drug:  levothyroxine Take 1 tablet (100 mcg total) by mouth daily before breakfast. Brand Name   tamoxifen 20 MG tablet Commonly known as:  NOLVADEX Take 1 tablet (20 mg total) by mouth daily.   Tdap 5-2.5-18.5 LF-MCG/0.5 injection Commonly known as:  BOOSTRIX Inject 0.5 mLs into the muscle once.       Review of Systems:  Review of Systems  Constitutional: Negative for chills and fever.  HENT: Negative for congestion.   Eyes: Negative for  blurred vision.  Respiratory: Positive for wheezing. Negative for cough, sputum production and shortness of breath.   Cardiovascular: Negative for chest pain, palpitations and leg swelling.  Gastrointestinal: Positive for blood in stool. Negative for abdominal pain, constipation, diarrhea and melena.  Hemorrhoids that bleed at times  Genitourinary: Negative for dysuria.  Musculoskeletal: Negative for falls.  Skin: Negative for rash.  Neurological: Negative for dizziness and loss of consciousness.  Endo/Heme/Allergies: Does not bruise/bleed easily.  Psychiatric/Behavioral: Negative for depression and memory loss. The patient is not nervous/anxious and does not have insomnia.     Health Maintenance  Topic Date Due  . TETANUS/TDAP  04/03/1949  . PNA vac Low Risk Adult (1 of 2 - PCV13) 04/02/1997  . INFLUENZA VACCINE  09/27/2015  . DEXA SCAN  Completed  . ZOSTAVAX  Completed    Physical Exam: Vitals:   02/09/16 1458  BP: 132/82  Pulse: 76  Resp: 18  Temp: 97.8 F (36.6 C)  TempSrc: Oral  SpO2: 95%   There is no height or weight on file to calculate BMI. Physical Exam  Constitutional: She is oriented to person, place, and time. She appears well-developed and well-nourished. No distress.  Here with her service dog  HENT:  Head: Normocephalic and atraumatic.  Cardiovascular: Normal rate, regular rhythm, normal heart sounds and intact distal pulses.   Pulmonary/Chest: Effort normal and breath sounds normal. No respiratory distress.  Abdominal: Soft. Bowel sounds are normal. She exhibits no distension.  Genitourinary:  Genitourinary Comments: Did not want me to perform exam of hemorrhoids  Musculoskeletal: Normal range of motion.  Neurological: She is alert and oriented to person, place, and time.  Skin: Skin is warm and dry.  Psychiatric: She has a normal mood and affect.    Labs reviewed: Basic Metabolic Panel:  Recent Labs  05/24/15 0946 09/13/15 0001  NA 141   --   K 4.1  --   CO2 29  --   GLUCOSE 79  --   BUN 18.5  --   CREATININE 0.8  --   CALCIUM 9.2  --   TSH  --  1.02   Liver Function Tests:  Recent Labs  05/24/15 0946  AST 22  ALT 21  ALKPHOS 42  BILITOT 0.43  PROT 6.5  ALBUMIN 3.5   No results for input(s): LIPASE, AMYLASE in the last 8760 hours. No results for input(s): AMMONIA in the last 8760 hours. CBC:  Recent Labs  03/12/15 1116 05/24/15 0946  WBC 7.7 5.8  NEUTROABS  --  2.3  HGB 13.9 12.7  HCT 41.7 38.8  MCV 86.2 88.2  PLT  --  184   Lipid Panel:  Recent Labs  09/13/15 0001  CHOL 228*  HDL 79  LDLCALC 132*  TRIG 85  CHOLHDL 2.9    Assessment/Plan 1. Hemorrhoids, unspecified hemorrhoid type - says she is likely going to see Dr. Excell Seltzer about these b/c they are bleeding more and leading to some loose incontinent stools and irritation -until then, cont prep H, anusol, and aquaphor - CBC with Differential/Platelet; Future - COMPLETE METABOLIC PANEL WITH GFR; Future  2. Hypothyroidism due to acquired atrophy of thyroid - cont current levothyroxine and f/u labs before next visit - TSH; Future - CBC with Differential/Platelet; Future - COMPLETE METABOLIC PANEL WITH GFR; Future  3. Pain in shin, unspecified laterality -suspect this is purely position and no treatment needed -she plans to see her orthopedist about it  4. Torticollis -mostly resolved, has a small area that recurs if she sleeps wrong, but then resolves again within 24 hrs -did very well with therapy before so would do that again if it persists in the future  5. Estrogen deficiency - needs f/u on bone density especially  being on tamoxifen and omeprazole - DG Bone Density; Future  6. Encounter for monitoring tamoxifen therapy - will be continuing for another 5 years (10 total) - annually f/u with heme/onc - DG Bone Density; Future  Labs/tests ordered:   Orders Placed This Encounter  Procedures  . DG Bone Density     Standing Status:   Future    Standing Expiration Date:   04/10/2017    Order Specific Question:   Reason for Exam (SYMPTOM  OR DIAGNOSIS REQUIRED)    Answer:   estrogen deficiency, ongoing tamoxifen therapy, hypothyroidism, omeprazole    Order Specific Question:   Preferred imaging location?    Answer:   [facility]  . TSH    Standing Status:   Future    Standing Expiration Date:   02/08/2017  . CBC with Differential/Platelet    Standing Status:   Future    Standing Expiration Date:   02/08/2017  . COMPLETE METABOLIC PANEL WITH GFR    [facility]    Standing Status:   Future    Standing Expiration Date:   02/08/2017    Next appt:  6 mos for AWV and med mgt with me  Janita Camberos L. Hether Anselmo, D.O. [facility] [address], [place] [address] Cell Phone (Mon-Fri 8am-5pm):  [phone] On Call:  [phone] & follow prompts after 5pm & weekends Office Phone:  [phone] Office Fax:  [phone]

## 2016-02-10 ENCOUNTER — Other Ambulatory Visit: Payer: Self-pay

## 2016-02-10 DIAGNOSIS — E2839 Other primary ovarian failure: Secondary | ICD-10-CM

## 2016-02-15 ENCOUNTER — Other Ambulatory Visit: Payer: Self-pay | Admitting: Nurse Practitioner

## 2016-04-03 ENCOUNTER — Other Ambulatory Visit: Payer: Self-pay | Admitting: *Deleted

## 2016-04-07 ENCOUNTER — Other Ambulatory Visit: Payer: Self-pay | Admitting: Oncology

## 2016-04-10 NOTE — Telephone Encounter (Signed)
Chart reviewed.

## 2016-04-17 ENCOUNTER — Other Ambulatory Visit: Payer: Self-pay | Admitting: Nurse Practitioner

## 2016-04-17 MED ORDER — LORAZEPAM 0.5 MG PO TABS: ORAL_TABLET | ORAL | 0 refills | Status: DC

## 2016-04-24 ENCOUNTER — Other Ambulatory Visit: Payer: Self-pay | Admitting: Oncology

## 2016-04-24 DIAGNOSIS — Z853 Personal history of malignant neoplasm of breast: Secondary | ICD-10-CM

## 2016-04-30 ENCOUNTER — Other Ambulatory Visit: Payer: Self-pay | Admitting: Internal Medicine

## 2016-04-30 MED ORDER — LORAZEPAM 0.5 MG PO TABS: ORAL_TABLET | ORAL | 0 refills | Status: DC

## 2016-05-24 ENCOUNTER — Other Ambulatory Visit: Payer: Self-pay | Admitting: Internal Medicine

## 2016-05-28 ENCOUNTER — Other Ambulatory Visit: Payer: Self-pay | Admitting: *Deleted

## 2016-05-28 MED ORDER — SYNTHROID 100 MCG PO TABS: 100.0000 ug | ORAL_TABLET | Freq: Every day | ORAL | 0 refills | Status: DC

## 2016-05-28 NOTE — Telephone Encounter (Signed)
Received fax from [facility] stating patient is having issues getting mail order shipment and needs Rx to cover in transition period.  Rx faxed to pharmacy.

## 2016-06-05 ENCOUNTER — Ambulatory Visit
Admission: RE | Admit: 2016-06-05 | Discharge: 2016-06-05 | Disposition: A | Discharge status: Home / Self Care | Payer: Medicare Other | Source: Ambulatory Visit | Attending: Oncology | Admitting: Oncology

## 2016-06-05 ENCOUNTER — Other Ambulatory Visit: Payer: Self-pay | Admitting: *Deleted

## 2016-06-05 ENCOUNTER — Other Ambulatory Visit: Payer: Self-pay | Admitting: Oncology

## 2016-06-05 DIAGNOSIS — R928 Other abnormal and inconclusive findings on diagnostic imaging of breast: Secondary | ICD-10-CM | POA: Diagnosis not present

## 2016-06-05 DIAGNOSIS — C50411 Malignant neoplasm of upper-outer quadrant of right female breast: Secondary | ICD-10-CM

## 2016-06-05 DIAGNOSIS — Z853 Personal history of malignant neoplasm of breast: Secondary | ICD-10-CM

## 2016-06-05 DIAGNOSIS — N6489 Other specified disorders of breast: Secondary | ICD-10-CM | POA: Diagnosis not present

## 2016-06-05 HISTORY — DX: Personal history of irradiation: Z92.3

## 2016-06-05 HISTORY — DX: Personal history of antineoplastic chemotherapy: Z92.21

## 2016-06-06 ENCOUNTER — Other Ambulatory Visit (HOSPITAL_BASED_OUTPATIENT_CLINIC_OR_DEPARTMENT_OTHER): Payer: Medicare Other

## 2016-06-06 DIAGNOSIS — Z853 Personal history of malignant neoplasm of breast: Secondary | ICD-10-CM

## 2016-06-06 DIAGNOSIS — C50411 Malignant neoplasm of upper-outer quadrant of right female breast: Secondary | ICD-10-CM

## 2016-06-06 LAB — COMPREHENSIVE METABOLIC PANEL
ALT: 15 U/L (ref 0–55)
AST: 20 U/L (ref 5–34)
Albumin: 3.7 g/dL (ref 3.5–5.0)
Alkaline Phosphatase: 36 U/L — ABNORMAL LOW (ref 40–150)
Anion Gap: 10 mEq/L (ref 3–11)
BUN: 18.7 mg/dL (ref 7.0–26.0)
CHLORIDE: 103 meq/L (ref 98–109)
CO2: 28 mEq/L (ref 22–29)
CREATININE: 0.8 mg/dL (ref 0.6–1.1)
Calcium: 9.2 mg/dL (ref 8.4–10.4)
EGFR: 65 mL/min/{1.73_m2} — ABNORMAL LOW (ref >90)
GLUCOSE: 82 mg/dL (ref 70–140)
POTASSIUM: 4 meq/L (ref 3.5–5.1)
SODIUM: 141 meq/L (ref 136–145)
Total Bilirubin: 0.42 mg/dL (ref 0.20–1.20)
Total Protein: 6.4 g/dL (ref 6.4–8.3)

## 2016-06-06 LAB — CBC WITH DIFFERENTIAL/PLATELET
BASO%: 0.5 % (ref 0.0–2.0)
BASOS ABS: 0 10*3/uL (ref 0.0–0.1)
EOS ABS: 0.2 10*3/uL (ref 0.0–0.5)
EOS%: 4.2 % (ref 0.0–7.0)
HEMATOCRIT: 33.3 % — AB (ref 34.8–46.6)
HEMOGLOBIN: 10.6 g/dL — AB (ref 11.6–15.9)
LYMPH#: 2.5 10*3/uL (ref 0.9–3.3)
LYMPH%: 45.6 % (ref 14.0–49.7)
MCH: 25.9 pg (ref 25.1–34.0)
MCHC: 31.8 g/dL (ref 31.5–36.0)
MCV: 81.4 fL (ref 79.5–101.0)
MONO#: 0.4 10*3/uL (ref 0.1–0.9)
MONO%: 6.9 % (ref 0.0–14.0)
NEUT#: 2.3 10*3/uL (ref 1.5–6.5)
NEUT%: 42.8 % (ref 38.4–76.8)
Platelets: 204 10*3/uL (ref 145–400)
RBC: 4.09 10*6/uL (ref 3.70–5.45)
RDW: 17.2 % — AB (ref 11.2–14.5)
WBC: 5.5 10*3/uL (ref 3.9–10.3)
nRBC: 0 % (ref 0–0)

## 2016-06-08 ENCOUNTER — Encounter: Payer: Self-pay | Admitting: Internal Medicine

## 2016-06-11 ENCOUNTER — Other Ambulatory Visit: Payer: Self-pay | Admitting: Osteopathic Medicine

## 2016-06-11 ENCOUNTER — Other Ambulatory Visit: Payer: Self-pay | Admitting: Internal Medicine

## 2016-06-11 DIAGNOSIS — L500 Allergic urticaria: Secondary | ICD-10-CM

## 2016-06-13 ENCOUNTER — Ambulatory Visit (HOSPITAL_BASED_OUTPATIENT_CLINIC_OR_DEPARTMENT_OTHER): Payer: Medicare Other | Admitting: Oncology

## 2016-06-13 ENCOUNTER — Encounter: Payer: Self-pay | Admitting: Oncology

## 2016-06-13 VITALS — BP 161/77 | HR 79 | Temp 97.9°F | Resp 18 | Ht 61.0 in | Wt 129.9 lb

## 2016-06-13 DIAGNOSIS — Z853 Personal history of malignant neoplasm of breast: Secondary | ICD-10-CM | POA: Diagnosis not present

## 2016-06-13 DIAGNOSIS — Z170 Estrogen receptor positive status [ER+]: Secondary | ICD-10-CM | POA: Diagnosis not present

## 2016-06-13 DIAGNOSIS — C50411 Malignant neoplasm of upper-outer quadrant of right female breast: Secondary | ICD-10-CM

## 2016-06-13 MED ORDER — TAMOXIFEN CITRATE 20 MG PO TABS: 20.0000 mg | ORAL_TABLET | Freq: Every day | ORAL | 4 refills | Status: DC

## 2016-06-13 NOTE — Progress Notes (Signed)
ID: Alice [name]   DOB: [birth year]  MR#: [id number]  TKW#:[id number]   [name], TIFFANY, DO SU:  Excell Seltzer, MD;  OTHER:  Thea Silversmith, MD; Silvano Rusk, MD, [name] NP, [name] MD    CHIEF COMPLAINT:  Right Breast Cancer  CURRENT THERAPY: Tamoxifen   HISTORY OF PRESENT ILLNESS: From the original intake note:  Alice [name] noted a mass in her right breast late December of 2011.  She brought this to Dr. Matthias [name] attention and he set her up for mammography at [facility] in [facility].  This showed a large spiculated mass in the posterior upper outer quadrant of the right breast which was palpable.  By ultrasound it measured 3.6 cm.  It was hypoechoic with internal blood flow.  Biopsy was suggested and performed January 17th, 2012.  The pathology from that biopsy ([id number]) showed an invasive ductal carcinoma, Grade 3, ER 95% and PR 47% positive with an MIB-1 of 50%, no HER2 amplification with a CISH ratio of 1.63.    With this information, the patient was referred to Dr. Barry [name], and bilateral breast MRIs were obtained March 20, 2010.  This showed in the posterior 3rd of the outer right breast a 3.8 cm mass with a clip artifact and in addition in the axillary tail of the right breast, a 9 mm rounded mildly irregular mass, corresponding to an abnormal appearing lymph node seen at mammography.  There were no other areas suspicious for enhancement.     Her subsequent history is as summarized below  INTERVAL HISTORY: Alice [name] returns today for follow-up of her estrogen receptor positive breast cancer accompanied by Alice [name], her therapy dog. She continues on tamoxifen. She tolerates this well  REVIEW OF SYSTEMS: Alice [name] has had some bright red blood per rectum which she feels is likely due to hemorrhoids. Otherwise a detailed review of systems today was entirely negative.  PAST MEDICAL HISTORY: Past Medical History:  Diagnosis Date  . Alopecia   . Breast cancer  ([place])     Right , chemo, radiation  . DDD (degenerative disc disease)   . Heart palpitations   . History of diverticulitis   . History of diverticulitis of colon   . Hypercholesteremia   . Hypothyroidism   . Insect bites   . Personal history of chemotherapy 2012  . Personal history of radiation therapy 2012  . Scoliosis    L4 L5  . Seasonal allergies   . SUI (stress urinary incontinence, female)   1. Greater than 30 pack-year tobacco abuse, the patient quitting in the sixties.  2. History of sling procedure. 3. Seasonal allergies. 4. Status post hernia repair under Dr. Rise Patience. 5. Status post small bowel resection for diverticular disease in 2001.  6. Status post left cataract surgery. 7. History of tonsillectomy. 8. History of palpitations.   9. History of degenerative disk disease. 10. Hypothyroidism. 11. Hypercholesterolemia. 12. Hypertension. Stress urinary incontinence  PAST SURGICAL HISTORY: Past Surgical History:  Procedure Laterality Date  . BLADDER SUSPENSION    . bowel rescetion    . BOWEL RESECTION    . BREAST BIOPSY Right 03/14/2010  . BREAST LUMPECTOMY  2012   right  . CATARACT EXTRACTION     left  . COLON SURGERY  20 YRS AGO   BOWEL RESECTION  . EYE SURGERY    . HERNIA REPAIR     [facility], Dr. Rise Patience  . JOINT REPLACEMENT    . PORT-A-CATH REMOVAL    . TONSILLECTOMY  2001   Diverticulitis  . TOTAL HIP ARTHROPLASTY Right 09/10/2014   Procedure: RIGHT TOTAL HIP ARTHROPLASTY ANTERIOR APPROACH;  Surgeon: Mcarthur Rossetti, MD;  Location: [facility];  Service: Orthopedics;  Laterality: Right;    FAMILY HISTORY The patient's father died from a stroke at the age of 67.  The patient's mother died with congestive heart failure at the age of 9.  The patient has one sister who died with non-Hodgkin's lymphoma at the age of 93.  She has a brother who was a Manufacturing engineer and died from unknown causes.    GYNECOLOGIC HISTORY: She is GX P3, first pregnancy  to term at age 65.  She had menarche at age 3.  She went through menopause in her fifties. She took hormone replacement until the time of her breast cancer diagnosis  SOCIAL HISTORY:  (Updated 12/08/2012) She worked as a Designer, jewellery. She is divorced and lives by herself with her canine companion, Alice [name], who is a [name] terrier and works as a Chief Technology Officer. Son Chilton Si lives in [place] where he works in Engineer, technical sales.  He is the patient's healthcare power of attorney and may be reached at [phone].  The patient's son Sherren Mocha also works in IT in the [place] area.  Son L. Terrance Child psychotherapist was Scientist, physiological of [employer] but is currently suing [employer] which has let him go. The patient has 5 grandchildren and 2 great-grandchildren. She attends [employer].      ADVANCED DIRECTIVES: In place  HEALTH MAINTENANCE: (Updated 12/08/2012) Social History  Substance Use Topics  . Smoking status: Former Smoker    Quit date: 08/09/1964  . Smokeless tobacco: Never Used  . Alcohol use Yes     Comment: RARE    Colonoscopy: never  PAP: 2001  Bone density: July 2014, normal  Lipid panel: Followed by Dr. Velna Hatchet   Allergies  Allergen Reactions  . Amoxicillin Rash  . Diphenhydramine Rash    "It is the red dye in Benadryl that I''m allergic to"  . Naproxen Sodium Rash    "It is the blue dye in naproxen that I'm allergic to."  . Lipitor [Atorvastatin]   . Meclizine Other (See Comments)    Worsening dizziness and blurred vision  . Amlodipine Other (See Comments)    Dizzy  . Cefdinir Other (See Comments)    Vertigo and headaches   . Codeine Other (See Comments)    unknown  . Methocarbamol Other (See Comments)    insomnia   . Prednisone Other (See Comments)    vertigo and headaches  . Skelaxin [Metaxalone] Other (See Comments)    Gastric upset.   . Tramadol Nausea Only  . Hctz [Hydrochlorothiazide] Itching    Current Outpatient Prescriptions  Medication  Sig Dispense Refill  . aspirin EC 81 MG tablet Take 81 mg by mouth.    . fish oil-omega-3 fatty acids 1000 MG capsule Take 2 g by mouth 2 (two) times daily.     . hydrOXYzine (VISTARIL) 25 MG capsule Take 1-2 capsules (25-50 mg total) by mouth 3 (three) times daily as needed for itching. 20 capsule 0  . lidocaine (XYLOCAINE) 5 % ointment Apply 1 application topically 3 (three) times daily as needed. For skin [name] 30 g 0  . LORazepam (ATIVAN) 0.5 MG tablet Take one tablet daily as needed 90 tablet 0  . loteprednol (LOTEMAX) 0.5 % ophthalmic suspension Place 1 drop into both eyes as needed. 10 mL 5  .  mometasone (ELOCON) 0.1 % cream Apply 1 application topically daily as needed. For severe itching 15 g 0  . Multiple Vitamin (MULTIVITAMIN WITH MINERALS) TABS tablet Take 1 tablet by mouth daily.    [place] Kitchen neomycin-bacitracin-polymyxin (NEOSPORIN) ointment Apply 1 application topically daily as needed for wound care. Reported on 03/12/2015    . omeprazole (PRILOSEC) 40 MG capsule TAKE 1 CAPSULE DAILY 90 capsule 3  . SYNTHROID 100 MCG tablet TAKE 1 TABLET DAILY BEFORE BREAKFAST 90 tablet 1  . tamoxifen (NOLVADEX) 20 MG tablet TAKE 1 TABLET DAILY 30 tablet 0  . Tdap (BOOSTRIX) 5-2.5-18.5 LF-MCG/0.5 injection Inject 0.5 mLs into the muscle once. 0.5 mL 0   No current facility-administered medications for this visit.     OBJECTIVE: Middle-aged white woman Who appears well  Vitals:   06/13/16 1347  BP: (!) 161/77  Pulse: 79  Resp: 18  Temp: 97.9 F (36.6 [name])     Body mass index is 24.54 kg/m.    ECOG FS: 1 Filed Weights   06/13/16 1347  Weight: 129 lb 14.4 oz (58.9 kg)    Sclerae unicteric, pupils round and equal Oropharynx clear and moist No cervical or supraclavicular adenopathy Lungs no rales or rhonchi Heart regular rate and rhythm Abd soft, nontender, positive bowel sounds MSK no focal spinal tenderness, no upper extremity lymphedema Neuro: nonfocal, well oriented, appropriate  affect Breasts: The right breast has undergone lumpectomy and radiation with no evidence of local recurrence. Left breast is unremarkable. Both axillae are benign.  LAB RESULTS: Lab Results  Component Value Date   WBC 5.5 06/06/2016   NEUTROABS 2.3 06/06/2016   HGB 10.6 (L) 06/06/2016   HCT 33.3 (L) 06/06/2016   MCV 81.4 06/06/2016   PLT 204 06/06/2016      Chemistry      Component Value Date/Time   NA 141 06/06/2016 1112   K 4.0 06/06/2016 1112   CL 103 09/11/2014 0440   CL 104 08/04/2012 1350   CO2 28 06/06/2016 1112   BUN 18.7 06/06/2016 1112   CREATININE 0.8 06/06/2016 1112   GLU 91 10/21/2014      Component Value Date/Time   CALCIUM 9.2 06/06/2016 1112   ALKPHOS 36 (L) 06/06/2016 1112   AST 20 06/06/2016 1112   ALT 15 06/06/2016 1112   BILITOT 0.42 06/06/2016 1112      STUDIES: We discussed her recent mammographic and ultrasound results and she understands very likely the irregularity we are seeing is due to her prior cyst aspiration, but follow-up in 6 months as planned.  ASSESSMENT: 83 y.o. [place] woman,   (1)  status post right breast biopsy March 17, 2010 for a grade 3 invasive ductal carcinoma measuring 3.8 cm by MRI with a suspicious right axillary lymph node which was, however, negative by biopsy, the tumor being estrogen and progesterone receptor positive, HER2-negative, with an MIB-1 of 50%.    (2)  Neoadjuvantly, she was treated with dose-dense doxorubicin and cyclophosphamide x4 followed by weekly paclitaxel x 12, completed July 2012 followed by definitive right lumpectomy and sentinel lymph node sampling August 2012 showing a complete pathologic response.   (3)  She completed radiation treatments November 2012 and started tamoxifen at that time.  (4)  discontinued tamoxifen in approximately July of 2014, and was switched to anastrozole for approximately 2 weeks between 10/31/2012 and 11/12/2012,  discontinued secondary to bone and muscle  [name].  (5)  started letrozole October 2014, discontinued because of headaches, nausea, vomiting, and aches  and pains.  (6) resumed tamoxifen November 2014    PLAN:  Ellyn is now 5-1/2 years out from definitive surgery for her breast cancer with no evidence of disease recurrence. This is very favorable  She is completing 5 years of tamoxifen. She understands the benefit of an additional 5 years are considerably less than the initial benefit. We would be talking about a 2-3% further risk reduction. Nevertheless and she is tolerating the drug so well and since she obtains it at [facility] will cause, she would like to continue and that makes sense to me.  She will have a repeat mammography plus or minus ultrasonography in 6 months that she will return to see me in one year  She knows to call for any problems that may develop before her next visit here.    Alice [name]    06/13/2016

## 2016-06-14 NOTE — Progress Notes (Signed)
Patient has seen Dr. Carlean Purl, will have appointment desk contact patient for follow up either Dr. Carlean Purl or one of the APP's.

## 2016-06-25 ENCOUNTER — Other Ambulatory Visit: Payer: Self-pay | Admitting: Oncology

## 2016-06-25 DIAGNOSIS — C50411 Malignant neoplasm of upper-outer quadrant of right female breast: Secondary | ICD-10-CM

## 2016-06-25 DIAGNOSIS — Z170 Estrogen receptor positive status [ER+]: Principal | ICD-10-CM

## 2016-06-28 ENCOUNTER — Other Ambulatory Visit: Payer: Self-pay

## 2016-06-28 MED ORDER — TAMOXIFEN CITRATE 20 MG PO TABS: 20.0000 mg | ORAL_TABLET | Freq: Every day | ORAL | 4 refills | Status: DC

## 2016-06-28 NOTE — Telephone Encounter (Signed)
[facility] 90 day supply request.

## 2016-07-06 ENCOUNTER — Ambulatory Visit (INDEPENDENT_AMBULATORY_CARE_PROVIDER_SITE_OTHER): Payer: Medicare Other | Admitting: Internal Medicine

## 2016-07-06 ENCOUNTER — Other Ambulatory Visit: Payer: Self-pay | Admitting: Oncology

## 2016-07-06 ENCOUNTER — Encounter: Payer: Self-pay | Admitting: Internal Medicine

## 2016-07-06 ENCOUNTER — Other Ambulatory Visit (INDEPENDENT_AMBULATORY_CARE_PROVIDER_SITE_OTHER): Payer: Medicare Other

## 2016-07-06 VITALS — BP 126/70 | HR 80

## 2016-07-06 DIAGNOSIS — D649 Anemia, unspecified: Secondary | ICD-10-CM

## 2016-07-06 DIAGNOSIS — K648 Other hemorrhoids: Secondary | ICD-10-CM | POA: Diagnosis not present

## 2016-07-06 DIAGNOSIS — K644 Residual hemorrhoidal skin tags: Secondary | ICD-10-CM

## 2016-07-06 LAB — CBC WITH DIFFERENTIAL/PLATELET
BASOS ABS: 0.1 10*3/uL (ref 0.0–0.1)
Basophils Relative: 0.9 % (ref 0.0–3.0)
EOS PCT: 2.2 % (ref 0.0–5.0)
Eosinophils Absolute: 0.1 10*3/uL (ref 0.0–0.7)
HCT: 32.9 % — ABNORMAL LOW (ref 36.0–46.0)
HEMOGLOBIN: 10.6 g/dL — AB (ref 12.0–15.0)
LYMPHS ABS: 2.7 10*3/uL (ref 0.7–4.0)
LYMPHS PCT: 40.8 % (ref 12.0–46.0)
MCHC: 32.4 g/dL (ref 30.0–36.0)
MCV: 80.6 fl (ref 78.0–100.0)
Monocytes Absolute: 0.5 10*3/uL (ref 0.1–1.0)
Monocytes Relative: 7.6 % (ref 3.0–12.0)
NEUTROS PCT: 48.5 % (ref 43.0–77.0)
Neutro Abs: 3.2 10*3/uL (ref 1.4–7.7)
Platelets: 233 10*3/uL (ref 150.0–400.0)
RBC: 4.08 Mil/uL (ref 3.87–5.11)
RDW: 17.9 % — ABNORMAL HIGH (ref 11.5–15.5)
WBC: 6.5 10*3/uL (ref 4.0–10.5)

## 2016-07-06 LAB — FERRITIN: FERRITIN: 8.4 ng/mL — AB (ref 10.0–291.0)

## 2016-07-06 LAB — VITAMIN B12: Vitamin B-12: >1500 pg/mL — ABNORMAL HIGH (ref 211–911)

## 2016-07-06 NOTE — Progress Notes (Signed)
   Alice [name] 83 y.o. 1930-07-19 [id number]  Assessment & Plan:   Encounter Diagnoses  Name Primary?  . Mild anemia Yes  . Internal and external bleeding hemorrhoids    I'm going to check her ferritin B12 and recheck a hemoglobin. I think her bleeding symptoms are clearly from her inflamed hemorrhoids. We started banding those today as below. She could need a diagnostic colonoscopy.   Subjective:   Chief Complaint: anemiaAnd rectal bleeding   HPI The patient is a very nice 83 year old white woman with a history of chronic intermittent rectal bleeding and prolapsing hemorrhoids by history, who also has a mild anemia. This is new, last year her hemoglobin was 12.7, and had been normal back to the year before. MCV is stable though somewhat down 81.4 RDW elevated at 17.2 with normal platelets and normal white count. Her hemoglobin is now 10.6. Her hemoglobin was abnormal after her right hip replacement though that was transient, at was in July 2016. She feels a bit tired. Bowel habits are regular though if she gets diarrhea or long hard stools hemorrhoidal prolapse and she'll have bleeding with bright red blood. She wonders if she might not eat enough iron.  I had last seen her in 2013 and we had discussed possible colon cancer screening at that time and she declined. That was probably a mutual agreement on our part. Medications, allergies, past medical history, past surgical history, family history and social history are reviewed and updated in the EMR.  Review of Systems Fatigue Some back pain  Objective:   Physical Exam BP 126/70 (BP Location: Left Arm, Patient Position: Sitting, Cuff Size: Normal)   Pulse 80  NAD Eyes anicteric conjuctivae pink Alert and oriented x 3  Appropriate mood and affect   Patti Martinique, CMA present.  Rectal LL prolapsed external hemorrhoid nontender and no mass  Anoscopy Gr 3 int/ext LL and Gr 2 RP and Gr 3 RA internal  hemorrhoids  PROCEDURE NOTE:Hemorrhoid ligation The patient presents with symptomatic grade 3  hemorrhoids, requesting rubber band ligation of his/her hemorrhoidal disease.  All risks, benefits and alternative forms of therapy were described and informed consent was obtained.   The anorectum was pre-medicated with 0.125% NTG 5% lidocaine The decision was made to band the LL and RA internal hemorrhoids, and the [place] was used to perform band ligation without complication.  Digital anorectal examination was then performed to assure proper positioning of the band, and to adjust the banded tissue as required.  The patient was discharged home without pain or other issues.  Dietary and behavioral recommendations were given and along with follow-up instructions.     The following adjunctive treatments were recommended:   The patient will return at a later date for  follow-up and possible additional banding as required. No complications were encountered and the patient tolerated the procedure well.

## 2016-07-06 NOTE — Patient Instructions (Signed)
Your physician has requested that you go to the basement for the following lab work before leaving today: CBC, Ferritin, B12 level   [place]   1. The procedure you have had should have been relatively painless since the banding of the area involved does not have nerve endings and there is no pain sensation.  The rubber band cuts off the blood supply to the hemorrhoid and the band may fall off as soon as 48 hours after the banding (the band may occasionally be seen in the toilet bowl following a bowel movement). You may notice a temporary feeling of fullness in the rectum which should respond adequately to plain Tylenol or Motrin.  2. Following the banding, avoid strenuous exercise that evening and resume full activity the next day.  A sitz bath (soaking in a warm tub) or bidet is soothing, and can be useful for cleansing the area after bowel movements.     3. To avoid constipation, take two tablespoons of natural wheat bran, natural oat bran, flax, Benefiber or any over the counter fiber supplement and increase your water intake to 7-8 glasses daily.    4. Unless you have been prescribed anorectal medication, do not put anything inside your rectum for two weeks: No suppositories, enemas, fingers, etc.  5. Occasionally, you may have more bleeding than usual after the banding procedure.  This is often from the untreated hemorrhoids rather than the treated one.  Don't be concerned if there is a tablespoon or so of blood.  If there is more blood than this, lie flat with your bottom higher than your head and apply an ice pack to the area. If the bleeding does not stop within a half an hour or if you feel faint, call our office at [phone] or go to the emergency room.  6. Problems are not common; however, if there is a substantial amount of bleeding, severe pain, chills, fever or difficulty passing urine (very rare) or other problems, you should call us  at [phone] or report to the nearest emergency room.  7. Do not stay seated continuously for more than 2-3 hours for a day or two after the procedure.  Tighten your buttock muscles 10-15 times every two hours and take 10-15 deep breaths every 1-2 hours.  Do not spend more than a few minutes on the toilet if you cannot empty your bowel; instead re-visit the toilet at a later time.    I appreciate the opportunity to care for you.

## 2016-07-09 NOTE — Progress Notes (Signed)
So the iron level is low - this means I recommend a colonoscopy to see if something other than hemorrhoids are bleeding - we started banding tose last week  So I would let that heal some and do a colonoscopy in June  She should take ferrous sulfate 325 mg bid please- with food

## 2016-07-10 ENCOUNTER — Other Ambulatory Visit: Payer: Self-pay

## 2016-07-10 DIAGNOSIS — D508 Other iron deficiency anemias: Secondary | ICD-10-CM

## 2016-07-28 ENCOUNTER — Other Ambulatory Visit: Payer: Self-pay | Admitting: Internal Medicine

## 2016-07-28 ENCOUNTER — Other Ambulatory Visit: Payer: Self-pay | Admitting: Osteopathic Medicine

## 2016-07-28 DIAGNOSIS — L500 Allergic urticaria: Secondary | ICD-10-CM

## 2016-07-30 MED ORDER — LORAZEPAM 0.5 MG PO TABS: ORAL_TABLET | ORAL | 0 refills | Status: DC

## 2016-07-30 MED ORDER — MOMETASONE FUROATE 0.1 % EX CREA: 1.0000 "application " | TOPICAL_CREAM | Freq: Every day | CUTANEOUS | 1 refills | Status: DC | PRN

## 2016-07-30 NOTE — Telephone Encounter (Signed)
A refill request was received via mychart for mometasone and lorazepam. Rx for lorazepam was printed and placed in Dr. Cyndi Lennert folder for signing. Rx for mometasone was [facility]. Patient was notified that these medications were refilled.

## 2016-08-01 ENCOUNTER — Encounter: Payer: Self-pay | Admitting: Internal Medicine

## 2016-08-01 ENCOUNTER — Ambulatory Visit (INDEPENDENT_AMBULATORY_CARE_PROVIDER_SITE_OTHER): Payer: Medicare Other | Admitting: Internal Medicine

## 2016-08-01 VITALS — BP 134/62 | HR 70 | Ht 61.0 in

## 2016-08-01 DIAGNOSIS — K648 Other hemorrhoids: Secondary | ICD-10-CM

## 2016-08-01 DIAGNOSIS — K644 Residual hemorrhoidal skin tags: Secondary | ICD-10-CM | POA: Diagnosis not present

## 2016-08-01 DIAGNOSIS — D500 Iron deficiency anemia secondary to blood loss (chronic): Secondary | ICD-10-CM

## 2016-08-01 NOTE — Patient Instructions (Signed)
  You have been scheduled for a colonoscopy. Please follow written instructions given to you at your visit today.  Please pick up your prep supplies at the pharmacy. If you use inhalers (even only as needed), please bring them with you on the day of your procedure.  Dr Carlean Purl is going to talk to Dr Jana Hakim about him setting you up for an iron infusion.  HEMORRHOID BANDING PROCEDURE    FOLLOW-UP CARE   1. The procedure you have had should have been relatively painless since the banding of the area involved does not have nerve endings and there is no pain sensation.  The rubber band cuts off the blood supply to the hemorrhoid and the band may fall off as soon as 48 hours after the banding (the band may occasionally be seen in the toilet bowl following a bowel movement). You may notice a temporary feeling of fullness in the rectum which should respond adequately to plain Tylenol or Motrin.  2. Following the banding, avoid strenuous exercise that evening and resume full activity the next day.  A sitz bath (soaking in a warm tub) or bidet is soothing, and can be useful for cleansing the area after bowel movements.     3. To avoid constipation, take two tablespoons of natural wheat bran, natural oat bran, flax, Benefiber or any over the counter fiber supplement and increase your water intake to 7-8 glasses daily.    4. Unless you have been prescribed anorectal medication, do not put anything inside your rectum for two weeks: No suppositories, enemas, fingers, etc.  5. Occasionally, you may have more bleeding than usual after the banding procedure.  This is often from the untreated hemorrhoids rather than the treated one.  Don't be concerned if there is a tablespoon or so of blood.  If there is more blood than this, lie flat with your bottom higher than your head and apply an ice pack to the area. If the bleeding does not stop within a half an hour or if you feel faint, call our office at [phone] or go to the emergency room.  6. Problems are not common; however, if there is a substantial amount of bleeding, severe pain, chills, fever or difficulty passing urine (very rare) or other problems, you should call us at [phone] or report to the nearest emergency room.  7. Do not stay seated continuously for more than 2-3 hours for a day or two after the procedure.  Tighten your buttock muscles 10-15 times every two hours and take 10-15 deep breaths every 1-2 hours.  Do not spend more than a few minutes on the toilet if you cannot empty your bowel; instead re-visit the toilet at a later time.    I appreciate the opportunity to care for you. Silvano Rusk, MD, [facility]

## 2016-08-01 NOTE — Progress Notes (Signed)
   HEMORRHOID BANDING   The patient returns after having hemorrhoidal banding of the left lateral right anterior hemorrhoid columns on 07/06/2016. She was complaining of intermittent rectal bleeding and prolapsed hemorrhoids.  He also had anemia, ferritin was low B12 okay I started her on oral iron but she has not tolerated that due to gastrointestinal upset so she stopped it.  Rectal exam with Janeece Riggers physician assistant student present demonstrates prolapsing of an external hemorrhoid in the right anterior. This is grade 3 and is manually reduced. No mass nontender.  PROCEDURE NOTE: The patient presents with symptomatic grade 2 and 3 hemorrhoids, requesting rubber band ligation of his/her hemorrhoidal disease.  All risks, benefits and alternative forms of therapy were described and informed consent was obtained.   The anorectum was pre-medicated with nitroglycerin and lidocaine topically The decision was made to band the right posterior and right anterior internal hemorrhoids, and the [place] was used to perform band ligation without complication.  Digital anorectal examination was then performed to assure proper positioning of the band, and to adjust the banded tissue as required.  The patient was discharged home without pain or other issues.  Dietary and behavioral recommendations were given and along with follow-up instructions.     The following adjunctive treatments were recommended:  I have recommended a colonoscopy to look for other causes of iron deficiency anemia, it could be chronic hemorrhoidal bleeding or nutritional but we should do a colonoscopy. She was somewhat reluctant but decided to go ahead will do it in July versus early August. What the hemorrhoids to heal.  We'll ask Dr. Jana Hakim if he can set her up for iron by the parenteral route.  I appreciate the opportunity to care for this patient. CC: [name], DO Dr. Gunnar Bulla Magrinat

## 2016-08-01 NOTE — Assessment & Plan Note (Signed)
Repeat banding today - RA was prolapsing again - some external component likely

## 2016-08-12 ENCOUNTER — Other Ambulatory Visit: Payer: Self-pay | Admitting: Oncology

## 2016-08-12 DIAGNOSIS — D500 Iron deficiency anemia secondary to blood loss (chronic): Secondary | ICD-10-CM | POA: Insufficient documentation

## 2016-08-12 DIAGNOSIS — D509 Iron deficiency anemia, unspecified: Secondary | ICD-10-CM | POA: Insufficient documentation

## 2016-08-12 DIAGNOSIS — C50411 Malignant neoplasm of upper-outer quadrant of right female breast: Secondary | ICD-10-CM

## 2016-08-16 ENCOUNTER — Telehealth: Payer: Self-pay | Admitting: Oncology

## 2016-08-16 NOTE — Telephone Encounter (Signed)
Confirmed 7/2 appt at 1230 per sch msg

## 2016-08-21 ENCOUNTER — Other Ambulatory Visit: Payer: Medicare Other

## 2016-08-21 DIAGNOSIS — E034 Atrophy of thyroid (acquired): Secondary | ICD-10-CM | POA: Diagnosis not present

## 2016-08-21 DIAGNOSIS — K649 Unspecified hemorrhoids: Secondary | ICD-10-CM | POA: Diagnosis not present

## 2016-08-21 LAB — CBC WITH DIFFERENTIAL/PLATELET
Basophils Absolute: 60 cells/uL (ref 0–200)
Basophils Relative: 1 %
Eosinophils Absolute: 300 cells/uL (ref 15–500)
Eosinophils Relative: 5 %
HCT: 36.9 % (ref 35.0–45.0)
Hemoglobin: 11.4 g/dL — ABNORMAL LOW (ref 11.7–15.5)
Lymphocytes Relative: 47 %
Lymphs Abs: 2820 cells/uL (ref 850–3900)
MCH: 25.8 pg — ABNORMAL LOW (ref 27.0–33.0)
MCHC: 30.9 g/dL — ABNORMAL LOW (ref 32.0–36.0)
MCV: 83.5 fL (ref 80.0–100.0)
MPV: 9.8 fL (ref 7.5–12.5)
Monocytes Absolute: 420 cells/uL (ref 200–950)
Monocytes Relative: 7 %
Neutro Abs: 2400 cells/uL (ref 1500–7800)
Neutrophils Relative %: 40 %
Platelets: 209 10*3/uL (ref 140–400)
RBC: 4.42 MIL/uL (ref 3.80–5.10)
RDW: 18 % — ABNORMAL HIGH (ref 11.0–15.0)
WBC: 6 10*3/uL (ref 3.8–10.8)

## 2016-08-21 LAB — COMPLETE METABOLIC PANEL WITH GFR
ALT: 10 U/L (ref 6–29)
AST: 17 U/L (ref 10–35)
Albumin: 3.6 g/dL (ref 3.6–5.1)
Alkaline Phosphatase: 32 U/L — ABNORMAL LOW (ref 33–130)
BUN: 17 mg/dL (ref 7–25)
CO2: 30 mmol/L (ref 20–31)
Calcium: 9.2 mg/dL (ref 8.6–10.4)
Chloride: 103 mmol/L (ref 98–110)
Creat: 0.81 mg/dL (ref 0.60–0.88)
GFR, Est African American: 77 mL/min (ref >=60)
GFR, Est Non African American: 67 mL/min (ref >=60)
Glucose, Bld: 90 mg/dL (ref 65–99)
Potassium: 4.6 mmol/L (ref 3.5–5.3)
Sodium: 140 mmol/L (ref 135–146)
Total Bilirubin: 0.4 mg/dL (ref 0.2–1.2)
Total Protein: 5.9 g/dL — ABNORMAL LOW (ref 6.1–8.1)

## 2016-08-21 LAB — TSH: TSH: 0.41 mIU/L

## 2016-08-23 ENCOUNTER — Ambulatory Visit: Payer: Medicare Other | Admitting: Internal Medicine

## 2016-08-23 ENCOUNTER — Ambulatory Visit: Payer: Medicare Other

## 2016-08-24 ENCOUNTER — Encounter: Payer: Self-pay | Admitting: Internal Medicine

## 2016-08-24 ENCOUNTER — Ambulatory Visit (INDEPENDENT_AMBULATORY_CARE_PROVIDER_SITE_OTHER): Payer: Medicare Other | Admitting: Internal Medicine

## 2016-08-24 ENCOUNTER — Ambulatory Visit (INDEPENDENT_AMBULATORY_CARE_PROVIDER_SITE_OTHER): Payer: Medicare Other

## 2016-08-24 VITALS — BP 118/60 | HR 72 | Temp 98.0°F | Ht 61.0 in

## 2016-08-24 DIAGNOSIS — D500 Iron deficiency anemia secondary to blood loss (chronic): Secondary | ICD-10-CM | POA: Diagnosis not present

## 2016-08-24 DIAGNOSIS — K649 Unspecified hemorrhoids: Secondary | ICD-10-CM | POA: Diagnosis not present

## 2016-08-24 DIAGNOSIS — L500 Allergic urticaria: Secondary | ICD-10-CM | POA: Diagnosis not present

## 2016-08-24 DIAGNOSIS — Z0000 Encounter for general adult medical examination without abnormal findings: Secondary | ICD-10-CM | POA: Diagnosis not present

## 2016-08-24 DIAGNOSIS — H029 Unspecified disorder of eyelid: Secondary | ICD-10-CM

## 2016-08-24 DIAGNOSIS — E034 Atrophy of thyroid (acquired): Secondary | ICD-10-CM

## 2016-08-24 DIAGNOSIS — C50411 Malignant neoplasm of upper-outer quadrant of right female breast: Secondary | ICD-10-CM | POA: Diagnosis not present

## 2016-08-24 DIAGNOSIS — Z170 Estrogen receptor positive status [ER+]: Secondary | ICD-10-CM

## 2016-08-24 MED ORDER — MOMETASONE FUROATE 0.1 % EX CREA: 1.0000 "application " | TOPICAL_CREAM | Freq: Every day | CUTANEOUS | 3 refills | Status: DC | PRN

## 2016-08-24 MED ORDER — LOTEPREDNOL ETABONATE 0.5 % OP SUSP: 1.0000 [drp] | OPHTHALMIC | 5 refills | Status: DC | PRN

## 2016-08-24 NOTE — Patient Instructions (Signed)
Alice [name] , Thank you for taking time to come for your Medicare Wellness Visit. I appreciate your ongoing commitment to your health goals. Please review the following plan we discussed and let me know if I can assist you in the future.   Screening recommendations/referrals: Colonoscopy due. Pt scheduled 09/25/16 Mammogram Up to date. Due 06/05/17 Bone Density up to date Recommended yearly ophthalmology/optometry visit for glaucoma screening and checkup Recommended yearly dental visit for hygiene and checkup  Vaccinations: Influenza vaccine due when available Pneumococcal vaccine up to date Tdap vaccine up to date. Due 05/02/2022 Shingles vaccine up to date. If you want the new vaccine let us know and we will send a prescription to your pharmacy  Advanced directives: Please bring [place] a copy next time you come  Conditions/risks identified: None  Next appointment: Dr. Mariea Clonts 08/24/16 @ 11am   Preventive Care 65 Years and Older, Female Preventive care refers to lifestyle choices and visits with your health care provider that can promote health and wellness. What does preventive care include?  A yearly physical exam. This is also called an annual well check.  Dental exams once or twice a year.  Routine eye exams. Ask your health care provider how often you should have your eyes checked.  Personal lifestyle choices, including:  Daily care of your teeth and gums.  Regular physical activity.  Eating a healthy diet.  Avoiding tobacco and drug use.  Limiting alcohol use.  Practicing safe sex.  Taking low-dose aspirin every day.  Taking vitamin and mineral supplements as recommended by your health care provider. What happens during an annual well check? The services and screenings done by your health care provider during your annual well check will depend on your age, overall health, lifestyle risk factors, and family history of disease. Counseling  Your health care provider may  ask you questions about your:  Alcohol use.  Tobacco use.  Drug use.  Emotional well-being.  Home and relationship well-being.  Sexual activity.  Eating habits.  History of falls.  Memory and ability to understand (cognition).  Work and work Statistician.  Reproductive health. Screening  You may have the following tests or measurements:  Height, weight, and BMI.  Blood pressure.  Lipid and cholesterol levels. These may be checked every 5 years, or more frequently if you are over 81 years old.  Skin check.  Lung cancer screening. You may have this screening every year starting at age 81 if you have a 30-pack-year history of smoking and currently smoke or have quit within the past 15 years.  Fecal occult blood test (FOBT) of the stool. You may have this test every year starting at age 81.  Flexible sigmoidoscopy or colonoscopy. You may have a sigmoidoscopy every 5 years or a colonoscopy every 10 years starting at age 81.  Hepatitis C blood test.  Hepatitis B blood test.  Sexually transmitted disease (STD) testing.  Diabetes screening. This is done by checking your blood sugar (glucose) after you have not eaten for a while (fasting). You may have this done every 1-3 years.  Bone density scan. This is done to screen for osteoporosis. You may have this done starting at age 81.  Mammogram. This may be done every 1-2 years. Talk to your health care provider about how often you should have regular mammograms. Talk with your health care provider about your test results, treatment options, and if necessary, the need for more tests. Vaccines  Your health care provider may recommend  certain vaccines, such as:  Influenza vaccine. This is recommended every year.  Tetanus, diphtheria, and acellular pertussis (Tdap, Td) vaccine. You may need a Td booster every 10 years.  Zoster vaccine. You may need this after age 81.  Pneumococcal 13-valent conjugate (PCV13) vaccine. One  dose is recommended after age 81.  Pneumococcal polysaccharide (PPSV23) vaccine. One dose is recommended after age 81. Talk to your health care provider about which screenings and vaccines you need and how often you need them. This information is not intended to replace advice given to you by your health care provider. Make sure you discuss any questions you have with your health care provider. Document Released: 03/11/2015 Document Revised: 11/02/2015 Document Reviewed: 12/14/2014 Elsevier Interactive Patient Education  2017 [place] Prevention in the Home Falls can cause injuries. They can happen to people of all ages. There are many things you can do to make your home safe and to help prevent falls. What can I do on the outside of my home?  Regularly fix the edges of walkways and driveways and fix any cracks.  Remove anything that might make you trip as you walk through a door, such as a raised step or threshold.  Trim any bushes or trees on the path to your home.  Use bright outdoor lighting.  Clear any walking paths of anything that might make someone trip, such as rocks or tools.  Regularly check to see if handrails are loose or broken. Make sure that both sides of any steps have handrails.  Any raised decks and porches should have guardrails on the edges.  Have any leaves, snow, or ice cleared regularly.  Use sand or salt on walking paths during winter.  Clean up any spills in your garage right away. This includes oil or grease spills. What can I do in the bathroom?  Use night lights.  Install grab bars by the toilet and in the tub and shower. Do not use towel bars as grab bars.  Use non-skid mats or decals in the tub or shower.  If you need to sit down in the shower, use a plastic, non-slip stool.  Keep the floor dry. Clean up any water that spills on the floor as soon as it happens.  Remove soap buildup in the tub or shower regularly.  Attach bath  mats securely with double-sided non-slip rug tape.  Do not have throw rugs and other things on the floor that can make you trip. What can I do in the bedroom?  Use night lights.  Make sure that you have a light by your bed that is easy to reach.  Do not use any sheets or blankets that are too big for your bed. They should not hang down onto the floor.  Have a firm chair that has side arms. You can use this for support while you get dressed.  Do not have throw rugs and other things on the floor that can make you trip. What can I do in the kitchen?  Clean up any spills right away.  Avoid walking on wet floors.  Keep items that you use a lot in easy-to-reach places.  If you need to reach something above you, use a strong step stool that has a grab bar.  Keep electrical cords out of the way.  Do not use floor polish or wax that makes floors slippery. If you must use wax, use non-skid floor wax.  Do not have throw rugs and other  things on the floor that can make you trip. What can I do with my stairs?  Do not leave any items on the stairs.  Make sure that there are handrails on both sides of the stairs and use them. Fix handrails that are broken or loose. Make sure that handrails are as long as the stairways.  Check any carpeting to make sure that it is firmly attached to the stairs. Fix any carpet that is loose or worn.  Avoid having throw rugs at the top or bottom of the stairs. If you do have throw rugs, attach them to the floor with carpet tape.  Make sure that you have a light switch at the top of the stairs and the bottom of the stairs. If you do not have them, ask someone to add them for you. What else can I do to help prevent falls?  Wear shoes that:  Do not have high heels.  Have rubber bottoms.  Are comfortable and fit you well.  Are closed at the toe. Do not wear sandals.  If you use a stepladder:  Make sure that it is fully opened. Do not climb a closed  stepladder.  Make sure that both sides of the stepladder are locked into place.  Ask someone to hold it for you, if possible.  Clearly mark and make sure that you can see:  Any grab bars or handrails.  First and last steps.  Where the edge of each step is.  Use tools that help you move around (mobility aids) if they are needed. These include:  Canes.  Walkers.  Scooters.  Crutches.  Turn on the lights when you go into a dark area. Replace any light bulbs as soon as they burn out.  Set up your furniture so you have a clear path. Avoid moving your furniture around.  If any of your floors are uneven, fix them.  If there are any pets around you, be aware of where they are.  Review your medicines with your doctor. Some medicines can make you feel dizzy. This can increase your chance of falling. Ask your doctor what other things that you can do to help prevent falls. This information is not intended to replace advice given to you by your health care provider. Make sure you discuss any questions you have with your health care provider. Document Released: 12/09/2008 Document Revised: 07/21/2015 Document Reviewed: 03/19/2014 Elsevier Interactive Patient Education  2017 [employer].

## 2016-08-24 NOTE — Progress Notes (Signed)
Subjective:   Alice [name] is a 83 y.o. female who presents for Medicare Annual (Subsequent) preventive examination.     Objective:     Vitals: BP 118/60 (BP Location: Left Arm, Patient Position: Sitting)   Pulse 72   Temp 98 F (36.7 C) (Oral)   Ht 5\' 1"  (1.549 m)   SpO2 96%   There is no height or weight on file to calculate BMI.   Tobacco History  Smoking Status  . Former Smoker  . Quit date: 08/09/1964  Smokeless Tobacco  . Never Used     Counseling given: Not Answered   Past Medical History:  Diagnosis Date  . Alopecia   . Breast cancer ([place])     Right , chemo, radiation  . DDD (degenerative disc disease)   . Heart palpitations   . History of diverticulitis   . History of diverticulitis of colon   . Hypercholesteremia   . Hypothyroidism   . Insect bites   . Internal and external bleeding hemorrhoids 08/11/2015  . Personal history of chemotherapy 2012  . Personal history of radiation therapy 2012  . Scoliosis    L4 L5  . Seasonal allergies   . SUI (stress urinary incontinence, female)    Past Surgical History:  Procedure Laterality Date  . BLADDER SUSPENSION    . bowel rescetion    . BOWEL RESECTION    . BREAST BIOPSY Right 03/14/2010  . BREAST LUMPECTOMY  2012   right  . CATARACT EXTRACTION     left  . COLON SURGERY  20 YRS AGO   BOWEL RESECTION  . EYE SURGERY    . HEMORRHOID BANDING    . HERNIA REPAIR     [facility], Dr. Rise Patience  . JOINT REPLACEMENT    . PORT-A-CATH REMOVAL    . TONSILLECTOMY  2001   Diverticulitis  . TOTAL HIP ARTHROPLASTY Right 09/10/2014   Procedure: RIGHT TOTAL HIP ARTHROPLASTY ANTERIOR APPROACH;  Surgeon: Mcarthur Rossetti, MD;  Location: [facility];  Service: Orthopedics;  Laterality: Right;   Family History  Problem Relation Age of Onset  . Heart disease Mother   . Heart disease Father   . CVA Father 101  . Cancer Sister 13       lymphoma  . Asthma Son   . Asthma Son   . Asthma Son   . Skin cancer Brother 67         melanoma  . Colon cancer Unknown    History  Sexual Activity  . Sexual activity: Not Currently    Outpatient Encounter Prescriptions as of 08/24/2016  Medication Sig  . aspirin EC 81 MG tablet Take 81 mg by mouth.  . fish oil-omega-3 fatty acids 1000 MG capsule Take 2 g by mouth 2 (two) times daily.   Marland Kitchen lidocaine (XYLOCAINE) 5 % ointment Apply 1 application topically 3 (three) times daily as needed. For skin pain  . LORazepam (ATIVAN) 0.5 MG tablet Take one tablet daily as needed  . mometasone (ELOCON) 0.1 % cream Apply 1 application topically daily as needed. For severe itching  . Multiple Vitamin (MULTIVITAMIN WITH MINERALS) TABS tablet Take 1 tablet by mouth daily.  . naproxen sodium (ANAPROX) 220 MG tablet Take 220 mg by mouth 2 (two) times daily.  Marland Kitchen neomycin-bacitracin-polymyxin (NEOSPORIN) ointment Apply 1 application topically daily as needed for wound care. Reported on 03/12/2015  . omeprazole (PRILOSEC) 40 MG capsule TAKE 1 CAPSULE DAILY  . SYNTHROID 100 MCG tablet TAKE  1 TABLET DAILY BEFORE BREAKFAST  . tamoxifen (NOLVADEX) 20 MG tablet Take 1 tablet (20 mg total) by mouth daily.  Marland Kitchen loteprednol (LOTEMAX) 0.5 % ophthalmic suspension Place 1 drop into both eyes as needed. (Patient not taking: Reported on 08/24/2016)   No facility-administered encounter medications on file as of 08/24/2016.     Activities of Daily Living In your present state of health, do you have any difficulty performing the following activities: 08/24/2016  Hearing? N  Vision? N  Difficulty concentrating or making decisions? N  Walking or climbing stairs? N  Dressing or bathing? N  Doing errands, shopping? N  Preparing Food and eating ? N  Using the Toilet? N  In the past six months, have you accidently leaked urine? Y  Do you have problems with loss of bowel control? N  Managing your Medications? N  Managing your Finances? N  Housekeeping or managing your Housekeeping? N  Some recent data might  be hidden    Patient Care Team: [name], DO as PCP - General (Geriatric Medicine) Mcarthur Rossetti, MD as Consulting Physician (Orthopedic Surgery) Magrinat, Virgie Dad, MD as Consulting Physician (Oncology)    Assessment:   Exercise Activities and Dietary recommendations Current Exercise Habits: Home exercise routine, Time (Minutes): 30, Frequency (Times/Week): 4, Weekly Exercise (Minutes/Week): 120, Exercise limited by: None identified  Goals    . Increase water intake          Pt will try to increase water intake.       Fall Risk Fall Risk  08/24/2016 08/20/2015 08/11/2015 06/24/2015 04/13/2015  Falls in the past year? No No No No No   Depression Screen PHQ 2/9 Scores 08/24/2016 08/20/2015 08/11/2015 06/24/2015  PHQ - 2 Score 1 0 0 0     Cognitive Function MMSE - Mini Mental State Exam 08/24/2016 07/20/2015  Orientation to time 5 5  Orientation to Place 4 5  Registration 3 3  Attention/ Calculation 5 5  Recall 3 3  Language- name 2 objects 2 2  Language- repeat 1 1  Language- follow 3 step command 2 3  Language- read & follow direction 1 1  Write a sentence 1 1  Copy design 1 1  Total score 28 30        Immunization History  Administered Date(s) Administered  . Zoster 02/27/1999   Screening Tests Health Maintenance  Topic Date Due  . TETANUS/TDAP  04/03/1949  . PNA vac Low Risk Adult (1 of 2 - PCV13) 04/02/1997  . INFLUENZA VACCINE  09/26/2016  . DEXA SCAN  Completed      Plan:    I have personally reviewed and addressed the Medicare Annual Wellness questionnaire and have noted the following in the patient's chart:  A. Medical and social history B. Use of alcohol, tobacco or illicit drugs  C. Current medications and supplements D. Functional ability and status E.  Nutritional status F.  Physical activity G. Advance directives H. List of other physicians I.  Hospitalizations, surgeries, and ER visits in previous 12 months J.   [place] to include hearing, vision, cognitive, depression L. Referrals and appointments - none  In addition, I have reviewed and discussed with patient certain preventive protocols, quality metrics, and best practice recommendations. A written personalized care plan for preventive services as well as general preventive health recommendations were provided to patient.  See attached scanned questionnaire for additional information.   Signed,   Rich Reining, RN Nurse Health Advisor  Quick Notes   Health Maintenance: up to date. Sent in refill for Lotemax. Needs a prescription for a bigger quantity of Mometasone     Abnormal Screen: MMSE 28/30. Did not pass clock drawing     Patient Concerns: Sometimes wakes up in the middle of the night with lower right or left pain. "like a muscle cramp"     Nurse Concerns: None

## 2016-08-24 NOTE — Progress Notes (Signed)
Location:  [facility] clinic Provider:  Graylin Sperling L. Mariea Clonts, D.O., C.M.D.  Code Status: DNR Goals of Care:  Advanced Directives 08/24/2016  Does Patient Have a Medical Advance Directive? Yes  Type of Paramedic of [place];Living will  Does patient want to make changes to medical advance directive? No - Patient declined  Copy of [place] in Chart? No - copy requested   Chief Complaint  Patient presents with  . Medical Management of Chronic Issues    follow-up    HPI: Patient is a 83 y.o. female seen today for medical management of chronic diseases.    Breast cancer:  Continues on tamoxifen. Had in right breast.  Had an appt with Dr. Jana Hakim.  He recommended a colonoscopy.  Her hgb is dropping.  Dr. Carlean Purl did a banding procedure for her hemorrhoids.  May need hemorrhoidectomy.  She stopped the ferrous sulfate which he prescribed b/c it caused constipation.  She's eating dried apricots instead.  She has been eating hamburgers, also.  Reviewed labs with her and hgb has trended up.      Left breast had abnormality so 6 mos mammogram recommended.  She had actually had a cyst aspirated on the left breast, she remembered upon returning home.      Children are all over 6 hrs away.  Not the same as being home.  Loves her dog for emotional support.    She had been seeing Dr. Ellie Lunch.  She saw Dr. Satira Sark instead the last time.  She's not sure when her cataract surgery was.  The right inner corner of her eye is discolored since then (appears reddened near canthus). No visual changes, pain, just the redness which is not IN the eye, but on skin medial to it.Marland Kitchen    MMSE - Mini Mental State Exam 08/24/2016 07/20/2015  Orientation to time 5 5  Orientation to Place 4 5  Registration 3 3  Attention/ Calculation 5 5  Recall 3 3  Language- name 2 objects 2 2  Language- repeat 1 1  Language- follow 3 step command 2 3  Language- read & follow direction 1 1  Write a  sentence 1 1  Copy design 1 1  Total score 28 30  failed clock today.    Past Medical History:  Diagnosis Date  . Alopecia   . Breast cancer ([place])     Right , chemo, radiation  . DDD (degenerative disc disease)   . Heart palpitations   . History of diverticulitis   . History of diverticulitis of colon   . Hypercholesteremia   . Hypothyroidism   . Insect bites   . Internal and external bleeding hemorrhoids 08/11/2015  . Personal history of chemotherapy 2012  . Personal history of radiation therapy 2012  . Scoliosis    L4 L5  . Seasonal allergies   . SUI (stress urinary incontinence, female)     Past Surgical History:  Procedure Laterality Date  . BLADDER SUSPENSION    . bowel rescetion    . BOWEL RESECTION    . BREAST BIOPSY Right 03/14/2010  . BREAST LUMPECTOMY  2012   right  . CATARACT EXTRACTION     left  . COLON SURGERY  20 YRS AGO   BOWEL RESECTION  . EYE SURGERY    . HEMORRHOID BANDING    . HERNIA REPAIR     [facility], Dr. Rise Patience  . JOINT REPLACEMENT    . PORT-A-CATH REMOVAL    .  TONSILLECTOMY  2001   Diverticulitis  . TOTAL HIP ARTHROPLASTY Right 09/10/2014   Procedure: RIGHT TOTAL HIP ARTHROPLASTY ANTERIOR APPROACH;  Surgeon: Mcarthur Rossetti, MD;  Location: [facility];  Service: Orthopedics;  Laterality: Right;    Allergies  Allergen Reactions  . Amoxicillin Rash  . Diphenhydramine Rash    "It is the red dye in Benadryl that I''m allergic to"  . Naproxen Sodium Rash    "It is the blue dye in naproxen that I'm allergic to."  . Lipitor [Atorvastatin]   . Meclizine Other (See Comments)    Worsening dizziness and blurred vision  . Amlodipine Other (See Comments)    Dizzy  . Cefdinir Other (See Comments)    Vertigo and headaches   . Codeine Other (See Comments)    unknown  . Ferrous Sulfate     "tears belly up"  . Methocarbamol Other (See Comments)    insomnia   . Prednisone Other (See Comments)    vertigo and headaches  . Skelaxin [Metaxalone]  Other (See Comments)    Gastric upset.   . Tramadol Nausea Only  . Hctz [Hydrochlorothiazide] Itching    Allergies as of 08/24/2016      Reactions   Amoxicillin Rash   Diphenhydramine Rash   "It is the red dye in Benadryl that I''m allergic to"   Naproxen Sodium Rash   "It is the blue dye in naproxen that I'm allergic to."   Lipitor [atorvastatin]    Meclizine Other (See Comments)   Worsening dizziness and blurred vision   Amlodipine Other (See Comments)   Dizzy   Cefdinir Other (See Comments)   Vertigo and headaches    Codeine Other (See Comments)   unknown   Ferrous Sulfate    "tears belly up"   Methocarbamol Other (See Comments)   insomnia   Prednisone Other (See Comments)   vertigo and headaches   Skelaxin [metaxalone] Other (See Comments)   Gastric upset.    Tramadol Nausea Only   Hctz [hydrochlorothiazide] Itching      Medication List       Accurate as of 08/24/16 11:18 AM. Always use your most recent med list.          aspirin EC 81 MG tablet Take 81 mg by mouth.   fish oil-omega-3 fatty acids 1000 MG capsule Take 2 g by mouth 2 (two) times daily.   lidocaine 5 % ointment Commonly known as:  XYLOCAINE Apply 1 application topically 3 (three) times daily as needed. For skin pain   LORazepam 0.5 MG tablet Commonly known as:  ATIVAN Take one tablet daily as needed   loteprednol 0.5 % ophthalmic suspension Commonly known as:  LOTEMAX Place 1 drop into both eyes as needed.   mometasone 0.1 % cream Commonly known as:  ELOCON Apply 1 application topically daily as needed. For severe itching   multivitamin with minerals Tabs tablet Take 1 tablet by mouth daily.   naproxen sodium 220 MG tablet Commonly known as:  ANAPROX Take 220 mg by mouth 2 (two) times daily.   neomycin-bacitracin-polymyxin ointment Commonly known as:  NEOSPORIN Apply 1 application topically daily as needed for wound care. Reported on 03/12/2015   omeprazole 40 MG  capsule Commonly known as:  PRILOSEC TAKE 1 CAPSULE DAILY   SYNTHROID 100 MCG tablet Generic drug:  levothyroxine TAKE 1 TABLET DAILY BEFORE BREAKFAST   tamoxifen 20 MG tablet Commonly known as:  NOLVADEX Take 1 tablet (20 mg total) by mouth  daily.       Review of Systems:  Review of Systems  Constitutional: Negative for chills, fever and malaise/fatigue.  HENT: Negative for congestion.   Eyes: Positive for redness. Negative for blurred vision.  Respiratory: Negative for shortness of breath.   Cardiovascular: Negative for chest pain, palpitations and leg swelling.       Hs two areas on each medial ankle inferior to the malleolus that she asks if they are muscles, but appears to be fat pads  Gastrointestinal: Positive for blood in stool and constipation. Negative for abdominal pain, diarrhea and melena.       Hemorrhoids s/p banding  Genitourinary: Negative for dysuria.  Musculoskeletal: Positive for joint pain. Negative for falls.  Skin: Negative for itching and rash.  Neurological: Negative for dizziness, loss of consciousness and weakness.  Psychiatric/Behavioral: Positive for depression. Negative for memory loss. The patient is nervous/anxious. The patient does not have insomnia.        Doing well with Max    Health Maintenance  Topic Date Due  . INFLUENZA VACCINE  09/26/2016  . TETANUS/TDAP  05/02/2022  . DEXA SCAN  Completed  . PNA vac Low Risk Adult  Completed    Physical Exam: Vitals:   08/24/16 1100  BP: 118/60  Pulse: 72  Temp: 98 F (36.7 C)  TempSrc: Oral  SpO2: 96%  Height: 5\' 1"  (1.549 m)   There is no height or weight on file to calculate BMI. Physical Exam  Constitutional: She is oriented to person, place, and time. She appears well-developed and well-nourished. No distress.  HENT:  Head: Normocephalic and atraumatic.  Right Ear: External ear normal.  Left Ear: External ear normal.  Nose: Nose normal.  Mouth/Throat: Oropharynx is clear and  moist. No oropharyngeal exudate.  Eyes: Conjunctivae and EOM are normal. Pupils are equal, round, and reactive to light.  Neck: Neck supple. No JVD present.  Cardiovascular: Normal rate, regular rhythm, normal heart sounds and intact distal pulses.   Pulmonary/Chest: Effort normal and breath sounds normal. No respiratory distress.  Abdominal: Soft. Bowel sounds are normal. She exhibits no distension. There is no tenderness.  Musculoskeletal: Normal range of motion.  Lymphadenopathy:    She has no cervical adenopathy.  Neurological: She is alert and oriented to person, place, and time. No cranial nerve deficit.  Skin: Skin is warm and dry. Capillary refill takes less than 2 seconds. There is pallor.  Psychiatric: She has a normal mood and affect.    Labs reviewed: Basic Metabolic Panel:  Recent Labs  09/13/15 0001 06/06/16 1112 08/21/16 0903  NA  --  141 140  K  --  4.0 4.6  CL  --   --  103  CO2  --  28 30  GLUCOSE  --  82 90  BUN  --  18.7 17  CREATININE  --  0.8 0.81  CALCIUM  --  9.2 9.2  TSH 1.02  --  0.41   Liver Function Tests:  Recent Labs  06/06/16 1112 08/21/16 0903  AST 20 17  ALT 15 10  ALKPHOS 36* 32*  BILITOT 0.42 0.4  PROT 6.4 5.9*  ALBUMIN 3.7 3.6   No results for input(s): LIPASE, AMYLASE in the last 8760 hours. No results for input(s): AMMONIA in the last 8760 hours. CBC:  Recent Labs  06/06/16 1112 07/06/16 1416 08/21/16 0903  WBC 5.5 6.5 6.0  NEUTROABS 2.3 3.2 2,400  HGB 10.6* 10.6* 11.4*  HCT 33.3* 32.9* 36.9  MCV 81.4 80.6 83.5  PLT 204 233.0 209   Lipid Panel:  Recent Labs  09/13/15 0001  CHOL 228*  HDL 79  LDLCALC 132*  TRIG 85  CHOLHDL 2.9    Assessment/Plan 1. Allergic urticaria - requests that her mometasone which she uses for bug bite irritation be ordered in a larger tube due to frequent use - mometasone (ELOCON) 0.1 % cream; Apply 1 application topically daily as needed. For severe itching  Dispense: 50 g;  Refill: 3  2. Hypothyroidism due to acquired atrophy of thyroid -cont current levothyroxine, clinically euthyroid, monitor  3. Hemorrhoids, unspecified hemorrhoid type -s/p banding -hgb trending up -odd for hemorrhoids to cause that much bleeding, but we'll see what Dr.Gessner's opinion is since her hgb has now improved  4. Malignant neoplasm of upper-outer quadrant of right breast in female, estrogen receptor positive ([place]) -continues on tamoxifen therapy and following with heme/onc -would still get 6 mos mammogram despite remembering her cyst in the left breast just to be safe unless she would not intervene at all if there were another cancer found  5. Iron deficiency anemia due to chronic blood loss -cont increased iron in diet and monitor h/h -keep f/u with GI  6. Lesion of canthus -medial right canthus of eye is erythematous since cataract surgery--pt will f/u with ophthalmology--not painful or affecting vision, but we don't know what the problem is and she wants to know  Labs/tests ordered:  No orders of the defined types were placed in this encounter.   Next appt:  6 mos med mgt  Blayde Bacigalupi L. Rudene Poulsen, D.O. [facility] [address], [place] [address] Cell Phone (Mon-Fri 8am-5pm):  [phone] On Call:  [phone] & follow prompts after 5pm & weekends Office Phone:  [phone] Office Fax:  [phone]

## 2016-08-24 NOTE — Progress Notes (Signed)
ID: Alice [name]   DOB: 1930-10-10  MR#: [id number]  YCX#:[id number]   [name], DO SU:  Excell Seltzer, MD;  OTHER:  Thea Silversmith, MD; Silvano Rusk, MD, [name] NP, [name] MD    CHIEF COMPLAINT:  Right Breast Cancer  CURRENT THERAPY: Tamoxifen   HISTORY OF PRESENT ILLNESS: From the original intake note:  Alice [name] noted a mass in her right breast late December of 2011.  She brought this to Dr. Matthias Hughs attention and he set her up for mammography at [facility] in [facility].  This showed a large spiculated mass in the posterior upper outer quadrant of the right breast which was palpable.  By ultrasound it measured 3.6 cm.  It was hypoechoic with internal blood flow.  Biopsy was suggested and performed January 17th, 2012.  The pathology from that biopsy ([id number]) showed an invasive ductal carcinoma, Grade 3, ER 95% and PR 47% positive with an MIB-1 of 50%, no HER2 amplification with a CISH ratio of 1.63.    With this information, the patient was referred to Dr. Barry Dienes, and bilateral breast MRIs were obtained March 20, 2010.  This showed in the posterior 3rd of the outer right breast a 3.8 cm mass with a clip artifact and in addition in the axillary tail of the right breast, a 9 mm rounded mildly irregular mass, corresponding to an abnormal appearing lymph node seen at mammography.  There were no other areas suspicious for enhancement.     Her subsequent history is as summarized below  INTERVAL HISTORY: Alice [name] is here today for further evaluation of her iron deficiency anemia.  She has been found to be iron deficient, and is here to discus IV iron since she cannot tolerate oral iron.  She is currently undergoing work up with Dr. Carlean Purl in regards to her iron deficiency.    REVIEW OF SYSTEMS: A detailed ros is non contributory except what is noted above.    PAST MEDICAL HISTORY: Past Medical History:  Diagnosis Date  . Alopecia   . Breast  cancer ([place])     Right , chemo, radiation  . DDD (degenerative disc disease)   . Heart palpitations   . History of diverticulitis   . History of diverticulitis of colon   . Hypercholesteremia   . Hypothyroidism   . Insect bites   . Internal and external bleeding hemorrhoids 08/11/2015  . Personal history of chemotherapy 2012  . Personal history of radiation therapy 2012  . Scoliosis    L4 L5  . Seasonal allergies   . SUI (stress urinary incontinence, female)   1. Greater than 30 pack-year tobacco abuse, the patient quitting in the sixties.  2. History of sling procedure. 3. Seasonal allergies. 4. Status post hernia repair under Dr. Rise Patience. 5. Status post small bowel resection for diverticular disease in 2001.  6. Status post left cataract surgery. 7. History of tonsillectomy. 8. History of palpitations.   9. History of degenerative disk disease. 10. Hypothyroidism. 11. Hypercholesterolemia. 12. Hypertension. Stress urinary incontinence  PAST SURGICAL HISTORY: Past Surgical History:  Procedure Laterality Date  . BLADDER SUSPENSION    . bowel rescetion    . BOWEL RESECTION    . BREAST BIOPSY Right 03/14/2010  . BREAST LUMPECTOMY  2012   right  . CATARACT EXTRACTION     left  . COLON SURGERY  20 YRS AGO   BOWEL RESECTION  . EYE SURGERY    . HEMORRHOID BANDING    .  HERNIA REPAIR     RIH, Dr. Rise Patience  . JOINT REPLACEMENT    . PORT-A-CATH REMOVAL    . TONSILLECTOMY  2001   Diverticulitis  . TOTAL HIP ARTHROPLASTY Right 09/10/2014   Procedure: RIGHT TOTAL HIP ARTHROPLASTY ANTERIOR APPROACH;  Surgeon: Mcarthur Rossetti, MD;  Location: [facility];  Service: Orthopedics;  Laterality: Right;    FAMILY HISTORY The patient's father died from a stroke at the age of 22.  The patient's mother died with congestive heart failure at the age of 71.  The patient has one sister who died with non-Hodgkin's lymphoma at the age of 70.  She has a brother who was a Secondary school teacher and died from unknown causes.    GYNECOLOGIC HISTORY: She is GX P3, first pregnancy to term at age 72.  She had menarche at age 59.  She went through menopause in her fifties. 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 Son Chilton Si lives in [place] where he works in Engineer, technical sales.  He is the patient's healthcare power of attorney and may be reached at [phone].  The patient's son Sherren Mocha also works in IT in the [place] area.  Son L. Terrance Child psychotherapist was Scientist, physiological of [employer] but is currently suing [employer] which has let him go. The patient has 5 grandchildren and 2 great-grandchildren. She attends [employer].      ADVANCED DIRECTIVES: In place  HEALTH MAINTENANCE: (Updated 12/08/2012) Social History  Substance Use Topics  . Smoking status: Former Smoker    Quit date: 08/09/1964  . Smokeless tobacco: Never Used  . Alcohol use Yes     Comment: RARE    Colonoscopy: never  PAP: 2001  Bone density: July 2014, normal  Lipid panel: Followed by Dr. Velna Hatchet   Allergies  Allergen Reactions  . Amoxicillin Rash  . Diphenhydramine Rash    "It is the red dye in Benadryl that I''m allergic to"  . Naproxen Sodium Rash    "It is the blue dye in naproxen that I'm allergic to."  . Lipitor [Atorvastatin]   . Meclizine Other (See Comments)    Worsening dizziness and blurred vision  . Amlodipine Other (See Comments)    Dizzy  . Cefdinir Other (See Comments)    Vertigo and headaches   . Codeine Other (See Comments)    unknown  . Ferrous Sulfate     "tears belly up"  . Methocarbamol Other (See Comments)    insomnia   . Prednisone Other (See Comments)    vertigo and headaches  . Skelaxin [Metaxalone] Other (See  Comments)    Gastric upset.   . Tramadol Nausea Only  . Hctz [Hydrochlorothiazide] Itching    Current Outpatient Prescriptions  Medication Sig Dispense Refill  . aspirin EC 81 MG tablet Take 81 mg by mouth.    . fish oil-omega-3 fatty acids 1000 MG capsule Take 2 g by mouth 2 (two) times daily.     Marland Kitchen lidocaine (XYLOCAINE) 5 % ointment Apply 1 application topically 3 (three) times daily as needed. For skin [name] 30 g 0  . LORazepam (ATIVAN) 0.5 MG tablet Take one tablet daily as needed 90 tablet 0  . loteprednol (LOTEMAX) 0.5 % ophthalmic suspension Place 1  drop into both eyes as needed. 10 mL 5  . mometasone (ELOCON) 0.1 % cream Apply 1 application topically daily as needed. For severe itching 50 g 3  . Multiple Vitamin (MULTIVITAMIN WITH MINERALS) TABS tablet Take 1 tablet by mouth daily.    . naproxen sodium (ANAPROX) 220 MG tablet Take 220 mg by mouth 2 (two) times daily.    Marland Kitchen neomycin-bacitracin-polymyxin (NEOSPORIN) ointment Apply 1 application topically daily as needed for wound care. Reported on 03/12/2015    . omeprazole (PRILOSEC) 40 MG capsule TAKE 1 CAPSULE DAILY 90 capsule 3  . SYNTHROID 100 MCG tablet TAKE 1 TABLET DAILY BEFORE BREAKFAST 90 tablet 1  . tamoxifen (NOLVADEX) 20 MG tablet Take 1 tablet (20 mg total) by mouth daily. 90 tablet 4   No current facility-administered medications for this visit.     OBJECTIVE:   Vitals:   08/27/16 1311  BP: (!) 163/73  Pulse: 81  Resp: 18  Temp: 97.5 F (36.4 C)     There is no height or weight on file to calculate BMI.    ECOG FS: 1 Filed Weights   GENERAL: Patient is a well appearing female in no acute distress HEENT:  Sclerae anicteric.  Oropharynx clear and moist. No ulcerations or evidence of oropharyngeal candidiasis. Neck is supple. Conjunctivae are pale NODES:  No cervical, supraclavicular, or axillary lymphadenopathy palpated.  BREAST EXAM:  Patient declined. LUNGS:  Clear to auscultation bilaterally.  No wheezes or  rhonchi. HEART:  Regular rate and rhythm. No murmur appreciated. ABDOMEN:  Soft, nontender.  Positive, normoactive bowel sounds. No organomegaly palpated. MSK:  No focal spinal tenderness to palpation. Full range of motion bilaterally in the upper extremities. EXTREMITIES:  No peripheral edema.   SKIN:  Clear with no obvious rashes or skin changes. No nail dyscrasia. NEURO:  Nonfocal. Well oriented.  Appropriate affect.     LAB RESULTS: Lab Results  Component Value Date   WBC 6.7 08/27/2016   NEUTROABS 3.7 08/27/2016   HGB 11.0 (L) 08/27/2016   HCT 35.1 08/27/2016   MCV 83.2 08/27/2016   PLT 173 08/27/2016      Chemistry      Component Value Date/Time   NA 140 08/21/2016 0903   NA 141 06/06/2016 1112   K 4.6 08/21/2016 0903   K 4.0 06/06/2016 1112   CL 103 08/21/2016 0903   CL 104 08/04/2012 1350   CO2 30 08/21/2016 0903   CO2 28 06/06/2016 1112   BUN 17 08/21/2016 0903   BUN 18.7 06/06/2016 1112   CREATININE 0.81 08/21/2016 0903   CREATININE 0.8 06/06/2016 1112   GLU 91 10/21/2014      Component Value Date/Time   CALCIUM 9.2 08/21/2016 0903   CALCIUM 9.2 06/06/2016 1112   ALKPHOS 32 (L) 08/21/2016 0903   ALKPHOS 36 (L) 06/06/2016 1112   AST 17 08/21/2016 0903   AST 20 06/06/2016 1112   ALT 10 08/21/2016 0903   ALT 15 06/06/2016 1112   BILITOT 0.4 08/21/2016 0903   BILITOT 0.42 06/06/2016 1112      STUDIES: We discussed her recent mammographic and ultrasound results and she understands very likely the irregularity we are seeing is due to her prior cyst aspiration, but follow-up in 6 months as planned.  ASSESSMENT: 83 y.o. [place] woman,   (1)  status post right breast biopsy March 17, 2010 for a grade 3 invasive ductal carcinoma measuring 3.8 cm by MRI with a suspicious right axillary lymph node  which was, however, negative by biopsy, the tumor being estrogen and progesterone receptor positive, HER2-negative, with an MIB-1 of 50%.    (2)   Neoadjuvantly, she was treated with dose-dense doxorubicin and cyclophosphamide x4 followed by weekly paclitaxel x 12, completed July 2012 followed by definitive right lumpectomy and sentinel lymph node sampling August 2012 showing a complete pathologic response.   (3)  She completed radiation treatments November 2012 and started tamoxifen at that time.  (4)  discontinued tamoxifen in approximately July of 2014, and was switched to anastrozole for approximately 2 weeks between 10/31/2012 and 11/12/2012,  discontinued secondary to bone and muscle [name].  (5)  started letrozole October 2014, discontinued because of headaches, nausea, vomiting, and aches and pains.  (6) resumed tamoxifen November 2014  PLAN:  Alice [name] is here today for w/u regarding her iron deficiency.  I reviewed her case with Dr. Jana Hakim, and the fact that she cannot take oral iron.  If her lab results reveal low ferritin/iron, she will receive IV feraheme, 510 mg, IV x 2 doses, one week apart.  I gave her a detailed handout on feraheme today in her AVS.  We reviewed risks and benefits today of feraheme.    All questions were answered.  Alice [name] is in agreement with the above plan.  She knows to call prior to her next appointment should she have any questions or concerns.   A total of (30) minutes of face-to-face time was spent with this patient with greater than 50% of that time in counseling and care-coordination.    Scot Dock    08/27/2016

## 2016-08-24 NOTE — Addendum Note (Signed)
Addended by: Rich Reining E on: 08/24/2016 11:55 AM   Modules accepted: Orders

## 2016-08-27 ENCOUNTER — Encounter: Payer: Self-pay | Admitting: Adult Health

## 2016-08-27 ENCOUNTER — Other Ambulatory Visit: Payer: Self-pay | Admitting: *Deleted

## 2016-08-27 ENCOUNTER — Ambulatory Visit (HOSPITAL_BASED_OUTPATIENT_CLINIC_OR_DEPARTMENT_OTHER): Payer: Medicare Other | Admitting: Adult Health

## 2016-08-27 ENCOUNTER — Other Ambulatory Visit (HOSPITAL_BASED_OUTPATIENT_CLINIC_OR_DEPARTMENT_OTHER): Payer: Medicare Other

## 2016-08-27 VITALS — BP 163/73 | HR 81 | Temp 97.5°F | Resp 18 | Ht 61.0 in

## 2016-08-27 DIAGNOSIS — D509 Iron deficiency anemia, unspecified: Secondary | ICD-10-CM

## 2016-08-27 DIAGNOSIS — C50411 Malignant neoplasm of upper-outer quadrant of right female breast: Secondary | ICD-10-CM

## 2016-08-27 DIAGNOSIS — Z853 Personal history of malignant neoplasm of breast: Secondary | ICD-10-CM | POA: Diagnosis not present

## 2016-08-27 DIAGNOSIS — D500 Iron deficiency anemia secondary to blood loss (chronic): Secondary | ICD-10-CM

## 2016-08-27 DIAGNOSIS — Z170 Estrogen receptor positive status [ER+]: Principal | ICD-10-CM

## 2016-08-27 DIAGNOSIS — I10 Essential (primary) hypertension: Secondary | ICD-10-CM

## 2016-08-27 LAB — CBC & DIFF AND RETIC
BASO%: 0.4 % (ref 0.0–2.0)
Basophils Absolute: 0 10e3/uL (ref 0.0–0.1)
EOS%: 3.1 % (ref 0.0–7.0)
Eosinophils Absolute: 0.2 10e3/uL (ref 0.0–0.5)
HCT: 35.1 % (ref 34.8–46.6)
HGB: 11 g/dL — ABNORMAL LOW (ref 11.6–15.9)
Immature Retic Fract: 21.1 % — ABNORMAL HIGH (ref 1.60–10.00)
LYMPH%: 36.1 % (ref 14.0–49.7)
MCH: 26.1 pg (ref 25.1–34.0)
MCHC: 31.3 g/dL — ABNORMAL LOW (ref 31.5–36.0)
MCV: 83.2 fL (ref 79.5–101.0)
MONO#: 0.4 10e3/uL (ref 0.1–0.9)
MONO%: 5.8 % (ref 0.0–14.0)
NEUT#: 3.7 10e3/uL (ref 1.5–6.5)
NEUT%: 54.6 % (ref 38.4–76.8)
Platelets: 173 10e3/uL (ref 145–400)
RBC: 4.22 10e6/uL (ref 3.70–5.45)
RDW: 18.3 % — ABNORMAL HIGH (ref 11.2–14.5)
Retic %: 1.2 % (ref 0.70–2.10)
Retic Ct Abs: 50.64 10e3/uL (ref 33.70–90.70)
WBC: 6.7 10e3/uL (ref 3.9–10.3)
lymph#: 2.4 10e3/uL (ref 0.9–3.3)

## 2016-08-27 LAB — IRON AND TIBC
%SAT: 8 % — ABNORMAL LOW (ref 21–57)
IRON: 36 ug/dL — AB (ref 41–142)
TIBC: 438 ug/dL (ref 236–444)
UIBC: 401 ug/dL — ABNORMAL HIGH (ref 120–384)

## 2016-08-27 LAB — FERRITIN: Ferritin: 10 ng/mL (ref 9–269)

## 2016-08-27 NOTE — Patient Instructions (Signed)

## 2016-08-28 NOTE — Progress Notes (Signed)
This RN attempted to contact pt per lab and need for feraheme. Obtained VM - message left to return call to this RN.

## 2016-09-10 ENCOUNTER — Telehealth: Payer: Self-pay | Admitting: Oncology

## 2016-09-10 ENCOUNTER — Encounter: Payer: Self-pay | Admitting: Adult Health

## 2016-09-10 NOTE — Telephone Encounter (Signed)
Not sure why this was routed to Dr. Mariea Clonts, we didn't order the iron infusion, this was to be handled by oncology. Will forward to them.

## 2016-09-10 NOTE — Telephone Encounter (Signed)
Received call from pt stating she was instructed to call Primary Care at [facility] to schedule an Iron infusion. We do not have a record of seeing this patient in the past nor do we preform Iron infusions on site. Please call pt or our office for clarification.  PCP: [phone]

## 2016-09-10 NOTE — Telephone Encounter (Signed)
Scheduled appt per 7/16 sch message from [facility] - patient is aware of appt date and time.

## 2016-09-11 ENCOUNTER — Encounter: Payer: Self-pay | Admitting: Adult Health

## 2016-09-13 ENCOUNTER — Encounter: Payer: Self-pay | Admitting: Adult Health

## 2016-09-14 ENCOUNTER — Telehealth: Payer: Self-pay | Admitting: Internal Medicine

## 2016-09-14 NOTE — Telephone Encounter (Signed)
Patient will keep the colonoscopy as scheduled.  She is having a feraheme infusion on 09/20/16 and a second after the colonoscopy

## 2016-09-17 ENCOUNTER — Telehealth: Payer: Self-pay

## 2016-09-17 NOTE — Telephone Encounter (Signed)
Pt had to cancel her colonoscopy for 09-25-2016. Has moved to 10-25-16 @ 230 pm. Please mail new prep instructions.

## 2016-09-18 NOTE — Telephone Encounter (Signed)
New instructions mailed out to patient.

## 2016-09-19 ENCOUNTER — Other Ambulatory Visit: Payer: Self-pay | Admitting: Adult Health

## 2016-09-19 DIAGNOSIS — N6489 Other specified disorders of breast: Secondary | ICD-10-CM

## 2016-09-20 ENCOUNTER — Ambulatory Visit (HOSPITAL_BASED_OUTPATIENT_CLINIC_OR_DEPARTMENT_OTHER): Payer: Medicare Other

## 2016-09-20 VITALS — BP 136/65 | HR 64 | Temp 98.2°F | Resp 18

## 2016-09-20 DIAGNOSIS — D509 Iron deficiency anemia, unspecified: Secondary | ICD-10-CM

## 2016-09-20 DIAGNOSIS — D500 Iron deficiency anemia secondary to blood loss (chronic): Secondary | ICD-10-CM

## 2016-09-20 MED ORDER — SODIUM CHLORIDE 0.9 % IV SOLN
Freq: Once | INTRAVENOUS | Status: AC
  Administered 2016-09-20: 08:00:00 via INTRAVENOUS

## 2016-09-20 MED ORDER — SODIUM CHLORIDE 0.9 % IV SOLN
510.0000 mg | Freq: Once | INTRAVENOUS | Status: AC
  Administered 2016-09-20: 510 mg via INTRAVENOUS
  Filled 2016-09-20: qty 17

## 2016-09-20 NOTE — Patient Instructions (Signed)

## 2016-09-23 ENCOUNTER — Emergency Department (HOSPITAL_COMMUNITY)
Admission: EM | Admit: 2016-09-23 | Discharge: 2016-09-23 | Disposition: A | Discharge status: Home / Self Care | Payer: Medicare Other | Attending: Emergency Medicine | Admitting: Emergency Medicine

## 2016-09-23 ENCOUNTER — Encounter (HOSPITAL_COMMUNITY): Payer: Self-pay | Admitting: Emergency Medicine

## 2016-09-23 DIAGNOSIS — E039 Hypothyroidism, unspecified: Secondary | ICD-10-CM | POA: Insufficient documentation

## 2016-09-23 DIAGNOSIS — Y92828 Other wilderness area as the place of occurrence of the external cause: Secondary | ICD-10-CM | POA: Diagnosis not present

## 2016-09-23 DIAGNOSIS — S60562A Insect bite (nonvenomous) of left hand, initial encounter: Secondary | ICD-10-CM | POA: Diagnosis not present

## 2016-09-23 DIAGNOSIS — Y93K1 Activity, walking an animal: Secondary | ICD-10-CM | POA: Diagnosis not present

## 2016-09-23 DIAGNOSIS — Z79899 Other long term (current) drug therapy: Secondary | ICD-10-CM | POA: Insufficient documentation

## 2016-09-23 DIAGNOSIS — Z87891 Personal history of nicotine dependence: Secondary | ICD-10-CM | POA: Insufficient documentation

## 2016-09-23 DIAGNOSIS — L03114 Cellulitis of left upper limb: Secondary | ICD-10-CM | POA: Diagnosis not present

## 2016-09-23 DIAGNOSIS — Y999 Unspecified external cause status: Secondary | ICD-10-CM | POA: Insufficient documentation

## 2016-09-23 DIAGNOSIS — W57XXXA Bitten or stung by nonvenomous insect and other nonvenomous arthropods, initial encounter: Secondary | ICD-10-CM | POA: Insufficient documentation

## 2016-09-23 MED ORDER — SULFAMETHOXAZOLE-TRIMETHOPRIM 800-160 MG PO TABS: 1.0000 | ORAL_TABLET | Freq: Once | ORAL | Status: DC

## 2016-09-23 MED ORDER — DOXYCYCLINE HYCLATE 100 MG PO TABS
100.0000 mg | ORAL_TABLET | Freq: Once | ORAL | Status: AC
  Administered 2016-09-23: 100 mg via ORAL
  Filled 2016-09-23: qty 1

## 2016-09-23 MED ORDER — DOXYCYCLINE HYCLATE 100 MG PO CAPS: 100.0000 mg | ORAL_CAPSULE | Freq: Two times a day (BID) | ORAL | 0 refills | Status: DC

## 2016-09-23 NOTE — ED Triage Notes (Signed)
Pt with an insect bite to L hand, pt reports flying insect but unsure what type. Pt c/o worsening swelling to L hand up to wrist. Pt states she has been using cortisone and lidocaine cream and no relief. Pt reports numbness to L hand.

## 2016-09-23 NOTE — ED Provider Notes (Signed)
[facility] DEPT Provider Note   CSN: [id number] Arrival date & time: 09/23/16  1430  By signing my name below, I, Reola Mosher, attest that this documentation has been prepared under the direction and in the presence of Virgel Manifold, MD. Electronically Signed: Reola Mosher, ED Scribe. 09/23/16. 2:55 PM.  History   Chief Complaint Chief Complaint  Patient presents with  . Insect Bite   The history is provided by the patient. No language interpreter was used.   HPI Comments: Alice [name] is a 83 y.o. female w/ a h/o cellulitis, who presents to the Emergency Department complaining of a moderate, gradually worsening area of pain, redness, and swelling to the left hand onset yesterday evening. Pt reports that she was walking her dog in the woods yesterday around 4PM when she was stung by and unknown flying insect. Last night following this she had an acute onset of swelling, redness, and pain to the left hand. She has attempted rubbing the area with Caladryl, Lidocaine, and Bacitracin without significant relief of her symptoms. Pt states pain is exacerbated with palpation, direct pressure, and with movement of the left wrist. Denies fever, chills, or any other associated symptoms.   Past Medical History:  Diagnosis Date  . Alopecia   . Breast cancer ([place])     Right , chemo, radiation  . DDD (degenerative disc disease)   . Heart palpitations   . History of diverticulitis   . History of diverticulitis of colon   . Hypercholesteremia   . Hypothyroidism   . Insect bites   . Internal and external bleeding hemorrhoids 08/11/2015  . Personal history of chemotherapy 2012  . Personal history of radiation therapy 2012  . Scoliosis    L4 L5  . Seasonal allergies   . SUI (stress urinary incontinence, female)    Patient Active Problem List   Diagnosis Date Noted  . Iron deficiency anemia 08/12/2016  . Mild anemia 07/06/2016  . Left torticollis 08/11/2015  .  Hypothyroidism due to acquired atrophy of thyroid 08/11/2015  . Internal and external bleeding hemorrhoids 08/11/2015  . Osteoarthritis of right hip 09/10/2014  . Status post total replacement of right hip 09/10/2014  . Breast cancer of upper-outer quadrant of right female breast ([place]) 01/07/2013  . Arthralgia 12/08/2012  . Anxiety 02/12/2012  . Cat bite of finger 08/10/2011  . Cellulitis 08/10/2011   Past Surgical History:  Procedure Laterality Date  . BLADDER SUSPENSION    . bowel rescetion    . BOWEL RESECTION    . BREAST BIOPSY Right 03/14/2010  . BREAST LUMPECTOMY  2012   right  . CATARACT EXTRACTION     left  . COLON SURGERY  20 YRS AGO   BOWEL RESECTION  . EYE SURGERY    . HEMORRHOID BANDING    . HERNIA REPAIR     [facility], Dr. Rise Patience  . JOINT REPLACEMENT    . PORT-A-CATH REMOVAL    . TONSILLECTOMY  2001   Diverticulitis  . TOTAL HIP ARTHROPLASTY Right 09/10/2014   Procedure: RIGHT TOTAL HIP ARTHROPLASTY ANTERIOR APPROACH;  Surgeon: Mcarthur Rossetti, MD;  Location: [facility];  Service: Orthopedics;  Laterality: Right;   OB History    No data available     Home Medications    Prior to Admission medications   Medication Sig Start Date End Date Taking? Authorizing Provider  aspirin EC 81 MG tablet Take 81 mg by mouth.    [provider]  fish  oil-omega-3 fatty acids 1000 MG capsule Take 2 g by mouth 2 (two) times daily.     [provider]  lidocaine (XYLOCAINE) 5 % ointment Apply 1 application topically 3 (three) times daily as needed. For skin pain 08/20/15   Emeterio Reeve, DO  LORazepam (ATIVAN) 0.5 MG tablet Take one tablet daily as needed 07/30/16   Mariea Clonts, Tiffany L, DO  loteprednol (LOTEMAX) 0.5 % ophthalmic suspension Place 1 drop into both eyes as needed. 08/24/16   Reed, Tiffany L, DO  mometasone (ELOCON) 0.1 % cream Apply 1 application topically daily as needed. For severe itching 08/24/16   Reed, Tiffany L, DO  Multiple Vitamin  (MULTIVITAMIN WITH MINERALS) TABS tablet Take 1 tablet by mouth daily.    [provider]  naproxen sodium (ANAPROX) 220 MG tablet Take 220 mg by mouth 2 (two) times daily.    [provider]  neomycin-bacitracin-polymyxin (NEOSPORIN) ointment Apply 1 application topically daily as needed for wound care. Reported on 03/12/2015    [provider]  omeprazole (PRILOSEC) 40 MG capsule TAKE 1 CAPSULE DAILY 07/06/16   Magrinat, Virgie Dad, MD  SYNTHROID 100 MCG tablet TAKE 1 TABLET DAILY BEFORE BREAKFAST 06/11/16   Reed, Tiffany L, DO  tamoxifen (NOLVADEX) 20 MG tablet Take 1 tablet (20 mg total) by mouth daily. 06/28/16   [name], DO   Family History Family History  Problem Relation Age of Onset  . Heart disease Mother   . Heart disease Father   . CVA Father 27  . Cancer Sister 40       lymphoma  . Asthma Son   . Asthma Son   . Asthma Son   . Skin cancer Brother 33       melanoma  . Colon cancer Unknown    Social History Social History  Substance Use Topics  . Smoking status: Former Smoker    Quit date: 08/09/1964  . Smokeless tobacco: Never Used  . Alcohol use Yes     Comment: RARE   Allergies   Amoxicillin; Diphenhydramine; Naproxen sodium; Lipitor [atorvastatin]; Meclizine; Amlodipine; Cefdinir; Codeine; Ferrous sulfate; Methocarbamol; Prednisone; Skelaxin [metaxalone]; Tramadol; and Hctz [hydrochlorothiazide]  Review of Systems Review of Systems  Constitutional: Negative for chills and fever.  Musculoskeletal: Positive for arthralgias, joint swelling and myalgias.  Skin: Positive for color change.  All other systems reviewed and are negative.  Physical Exam Updated Vital Signs BP (!) 146/69 (BP Location: Right Arm)   Pulse 75   Temp 98.2 F (36.8 C) (Oral)   Resp 16   SpO2 99%   Physical Exam  Constitutional: She appears well-developed and well-nourished. No distress.  HENT:  Head: Normocephalic and atraumatic.  Eyes: Conjunctivae are  normal.  Neck: Normal range of motion.  Cardiovascular: Normal rate.   Pulmonary/Chest: Effort normal.  Abdominal: She exhibits no distension.  Neurological: She is alert.  Skin: No pallor.  Small punctate erythematous area in b/t the second and third MP joint on the dorsal aspect of the left hand. There is swelling, erythema, and increased warmth on the dorsum extending into the distal forearm. Warm to touch. Can actively ROM the left wrist.   Psychiatric: She has a normal mood and affect. Her behavior is normal.  Nursing note and vitals reviewed.  ED Treatments / Results  DIAGNOSTIC STUDIES: Oxygen Saturation is 99% on RA, normal by my interpretation.   COORDINATION OF CARE: 2:53 PM-Discussed next steps with pt. Pt verbalized understanding and is agreeable with  the plan.   Labs (all labs ordered are listed, but only abnormal results are displayed) Labs Reviewed - No data to display  EKG  EKG Interpretation None      Radiology No results found.  Procedures Procedures  Medications Ordered in ED Medications - No data to display  Initial Impression / Assessment and Plan / ED Course  I have reviewed the triage vital signs and the nursing notes.  Pertinent labs & imaging results that were available during my care of the patient were reviewed by me and considered in my medical decision making (see chart for details).   83 year old female with what I suspect is cellulitis versus less likely local reaction to an insect bite. I feel she is a peripheral outpatient treatment. Return precautions discussed.  Final Clinical Impressions(s) / ED Diagnoses   Final diagnoses:  Insect bite, initial encounter  Cellulitis of left upper extremity   New Prescriptions New Prescriptions   No medications on file   I personally preformed the services scribed in my presence. The recorded information has been reviewed is accurate. Virgel Manifold, MD.     Virgel Manifold, MD 10/11/16  [phone]

## 2016-09-25 ENCOUNTER — Encounter: Payer: Self-pay | Admitting: Adult Health

## 2016-09-25 ENCOUNTER — Ambulatory Visit: Payer: Medicare Other

## 2016-09-25 ENCOUNTER — Encounter: Payer: Medicare Other | Admitting: Internal Medicine

## 2016-09-27 ENCOUNTER — Ambulatory Visit: Payer: Medicare Other

## 2016-10-01 ENCOUNTER — Telehealth: Payer: Self-pay | Admitting: Oncology

## 2016-10-01 NOTE — Telephone Encounter (Signed)
Called pt to inform of r/s infusion appt 8/10 at 0845 per sch msg. transferred called to desk RN pt wanted to see about getting lab appt to see if she needed next iron infusion

## 2016-10-05 ENCOUNTER — Ambulatory Visit (HOSPITAL_BASED_OUTPATIENT_CLINIC_OR_DEPARTMENT_OTHER): Payer: Medicare Other

## 2016-10-05 VITALS — BP 134/71 | HR 66 | Temp 97.5°F | Resp 18 | Ht 61.0 in

## 2016-10-05 DIAGNOSIS — D509 Iron deficiency anemia, unspecified: Secondary | ICD-10-CM | POA: Diagnosis present

## 2016-10-05 DIAGNOSIS — D500 Iron deficiency anemia secondary to blood loss (chronic): Secondary | ICD-10-CM

## 2016-10-05 MED ORDER — SODIUM CHLORIDE 0.9 % IV SOLN
510.0000 mg | Freq: Once | INTRAVENOUS | Status: AC
  Administered 2016-10-05: 510 mg via INTRAVENOUS
  Filled 2016-10-05: qty 17

## 2016-10-05 MED ORDER — SODIUM CHLORIDE 0.9 % IV SOLN
Freq: Once | INTRAVENOUS | Status: AC
  Administered 2016-10-05: 10:00:00 via INTRAVENOUS

## 2016-10-05 NOTE — Patient Instructions (Signed)

## 2016-10-24 ENCOUNTER — Encounter: Payer: Self-pay | Admitting: Internal Medicine

## 2016-10-25 ENCOUNTER — Telehealth: Payer: Self-pay | Admitting: Internal Medicine

## 2016-10-25 ENCOUNTER — Encounter: Payer: Medicare Other | Admitting: Internal Medicine

## 2016-10-25 NOTE — Telephone Encounter (Signed)
Patient states that she started vomiting after her prep dose last night.  She stated thaqt her bm was "like baby poop."  She stated that she would contact her primary care MD for further advice.  Dr. Carlean Purl stated that she may cancel if her stools are not clear.  Patient cancelled for today, and might reschedule.

## 2016-11-10 ENCOUNTER — Other Ambulatory Visit: Payer: Self-pay | Admitting: Internal Medicine

## 2016-11-12 MED ORDER — LORAZEPAM 0.5 MG PO TABS: ORAL_TABLET | ORAL | 0 refills | Status: DC

## 2016-11-25 ENCOUNTER — Other Ambulatory Visit: Payer: Self-pay | Admitting: Internal Medicine

## 2016-12-07 ENCOUNTER — Ambulatory Visit
Admission: RE | Admit: 2016-12-07 | Discharge: 2016-12-07 | Disposition: A | Discharge status: Home / Self Care | Payer: Medicare Other | Source: Ambulatory Visit | Attending: Adult Health | Admitting: Adult Health

## 2016-12-07 ENCOUNTER — Ambulatory Visit: Payer: Medicare Other

## 2016-12-07 DIAGNOSIS — R922 Inconclusive mammogram: Secondary | ICD-10-CM | POA: Diagnosis not present

## 2016-12-07 DIAGNOSIS — N6489 Other specified disorders of breast: Secondary | ICD-10-CM

## 2016-12-19 DIAGNOSIS — H04563 Stenosis of bilateral lacrimal punctum: Secondary | ICD-10-CM | POA: Diagnosis not present

## 2016-12-19 DIAGNOSIS — H04223 Epiphora due to insufficient drainage, bilateral lacrimal glands: Secondary | ICD-10-CM | POA: Diagnosis not present

## 2017-01-28 DIAGNOSIS — H04561 Stenosis of right lacrimal punctum: Secondary | ICD-10-CM | POA: Diagnosis not present

## 2017-01-28 DIAGNOSIS — H04562 Stenosis of left lacrimal punctum: Secondary | ICD-10-CM | POA: Diagnosis not present

## 2017-01-30 ENCOUNTER — Other Ambulatory Visit: Payer: Self-pay | Admitting: Internal Medicine

## 2017-01-30 MED ORDER — LORAZEPAM 0.5 MG PO TABS: ORAL_TABLET | ORAL | 0 refills | Status: DC

## 2017-02-04 ENCOUNTER — Other Ambulatory Visit: Payer: Self-pay | Admitting: *Deleted

## 2017-02-13 ENCOUNTER — Other Ambulatory Visit: Payer: Self-pay | Admitting: *Deleted

## 2017-02-13 MED ORDER — LORAZEPAM 0.5 MG PO TABS: ORAL_TABLET | ORAL | 0 refills | Status: DC

## 2017-02-13 NOTE — Telephone Encounter (Signed)
Patient stated that she has not received her Rx for Lorazepam yet from [facility] and only have 1 left and going out of town tomorrow. Wants a supply called to local pharmacy.  [facility]ed [facility] and spoke with Loletha Grayer and Rx called in for #30.

## 2017-02-28 ENCOUNTER — Ambulatory Visit: Payer: Medicare Other | Admitting: Internal Medicine

## 2017-03-08 ENCOUNTER — Other Ambulatory Visit: Payer: Self-pay | Admitting: Internal Medicine

## 2017-03-08 MED ORDER — LORAZEPAM 0.5 MG PO TABS: ORAL_TABLET | ORAL | 0 refills | Status: DC

## 2017-03-18 ENCOUNTER — Ambulatory Visit (INDEPENDENT_AMBULATORY_CARE_PROVIDER_SITE_OTHER): Payer: Medicare Other | Admitting: Internal Medicine

## 2017-03-18 ENCOUNTER — Encounter: Payer: Self-pay | Admitting: Internal Medicine

## 2017-03-18 VITALS — BP 134/82 | HR 81 | Temp 98.2°F

## 2017-03-18 DIAGNOSIS — Z170 Estrogen receptor positive status [ER+]: Secondary | ICD-10-CM | POA: Diagnosis not present

## 2017-03-18 DIAGNOSIS — D500 Iron deficiency anemia secondary to blood loss (chronic): Secondary | ICD-10-CM | POA: Diagnosis not present

## 2017-03-18 DIAGNOSIS — E034 Atrophy of thyroid (acquired): Secondary | ICD-10-CM | POA: Diagnosis not present

## 2017-03-18 DIAGNOSIS — C50411 Malignant neoplasm of upper-outer quadrant of right female breast: Secondary | ICD-10-CM | POA: Diagnosis not present

## 2017-03-18 DIAGNOSIS — H6122 Impacted cerumen, left ear: Secondary | ICD-10-CM

## 2017-03-18 DIAGNOSIS — Z23 Encounter for immunization: Secondary | ICD-10-CM

## 2017-03-18 NOTE — Progress Notes (Signed)
Location:  [facility] clinic Provider:  Tiffany L. Mariea Clonts, D.O., C.M.D.  Code Status: DNR Goals of Care:  Advanced Directives 09/23/2016  Does Patient Have a Medical Advance Directive? Yes  Type of Paramedic of [place];Living will  Does patient want to make changes to medical advance directive? No - Patient declined  Copy of [place] in Chart? No - copy requested     Chief Complaint  Patient presents with  . Medical Management of Chronic Issues    Pt is being seen for a 6 month routine and medication management visit. Pt states that left ear pops whenever she swallows.   . Other    Pt has therapy dog in room  . Medication Refill    No refills needed at this time.     HPI: Patient is a 84 y.o. female seen today for medical management of chronic diseases. She presents today with her service dog, Maximilian. She is feeling well overall. Definite improvement with energy post iron infusions. She states she has healthier hair and nails now.   Left Ear: Popping frequently, usually when swallowing. No associated ear pain. Onset was about a month ago, then it went away and came back. Denies recent illness/virus. No fevers or chills. No sinus pain. She does feel like something is inside her ear at time.  Right Breat Cancer: She states she is doing well, and that Dr. Jana Hakim advised her to stay on the Tamoxifen for 5 years. She is followed by the [facility].  Hemorrhoids: These were banded. She does not feel that the banding worked. She notes some minor bleeding with wiping. Was suppose to have colonoscopy, but was unable to tolerate prep. So she decided to forgo the scope.   She is up to date with both her eye care and dentist. She would like her influenza vaccine today.    Past Medical History:  Diagnosis Date  . Alopecia   . Breast cancer ([place])     Right , chemo, radiation  . DDD (degenerative disc disease)   . Heart palpitations   .  History of diverticulitis   . History of diverticulitis of colon   . Hypercholesteremia   . Hypothyroidism   . Insect bites   . Internal and external bleeding hemorrhoids 08/11/2015  . Personal history of chemotherapy 2012  . Personal history of radiation therapy 2012  . Scoliosis    L4 L5  . Seasonal allergies   . SUI (stress urinary incontinence, female)     Past Surgical History:  Procedure Laterality Date  . BLADDER SUSPENSION    . bowel rescetion    . BOWEL RESECTION    . BREAST BIOPSY Right 03/14/2010  . BREAST LUMPECTOMY  2012   right  . CATARACT EXTRACTION     left  . COLON SURGERY  20 YRS AGO   BOWEL RESECTION  . EYE SURGERY    . HEMORRHOID BANDING    . HERNIA REPAIR     [facility], Dr. Rise Patience  . JOINT REPLACEMENT    . PORT-A-CATH REMOVAL    . TONSILLECTOMY  2001   Diverticulitis  . TOTAL HIP ARTHROPLASTY Right 09/10/2014   Procedure: RIGHT TOTAL HIP ARTHROPLASTY ANTERIOR APPROACH;  Surgeon: Mcarthur Rossetti, MD;  Location: [facility];  Service: Orthopedics;  Laterality: Right;    Allergies  Allergen Reactions  . Amoxicillin Rash  . Diphenhydramine Rash    "It is the red dye in Benadryl that I''m allergic  to"  . Naproxen Sodium Rash    "It is the blue dye in naproxen that I'm allergic to."  . Lipitor [Atorvastatin]   . Meclizine Other (See Comments)    Worsening dizziness and blurred vision  . Amlodipine Other (See Comments)    Dizzy  . Cefdinir Other (See Comments)    Vertigo and headaches   . Codeine Other (See Comments)    unknown  . Ferrous Sulfate     "tears belly up"  . Methocarbamol Other (See Comments)    insomnia   . Prednisone Other (See Comments)    vertigo and headaches  . Skelaxin [Metaxalone] Other (See Comments)    Gastric upset.   . Tramadol Nausea Only  . Hctz [Hydrochlorothiazide] Itching    Outpatient Encounter Medications as of 03/18/2017  Medication Sig  . aspirin EC 81 MG tablet Take 81 mg by mouth.  . fish oil-omega-3  fatty acids 1000 MG capsule Take 2 g by mouth 2 (two) times daily.   Marland Kitchen lidocaine (XYLOCAINE) 5 % ointment Apply 1 application topically 3 (three) times daily as needed. For skin pain  . LORazepam (ATIVAN) 0.5 MG tablet Take one tablet daily as needed  . loteprednol (LOTEMAX) 0.5 % ophthalmic suspension Place 1 drop into both eyes as needed.  . mometasone (ELOCON) 0.1 % cream Apply 1 application topically daily as needed. For severe itching  . naproxen sodium (ANAPROX) 220 MG tablet Take 220 mg by mouth 2 (two) times daily.  Marland Kitchen neomycin-bacitracin-polymyxin (NEOSPORIN) ointment Apply 1 application topically daily as needed for wound care. Reported on 03/12/2015  . omeprazole (PRILOSEC) 40 MG capsule TAKE 1 CAPSULE DAILY  . SYNTHROID 100 MCG tablet TAKE 1 TABLET DAILY BEFORE BREAKFAST  . tamoxifen (NOLVADEX) 20 MG tablet Take 1 tablet (20 mg total) by mouth daily.  . [DISCONTINUED] doxycycline (VIBRAMYCIN) 100 MG capsule Take 1 capsule (100 mg total) by mouth 2 (two) times daily.   No facility-administered encounter medications on file as of 03/18/2017.     Review of Systems:  Review of Systems  Constitutional: Negative for chills and fever.  HENT: Negative for ear pain, hearing loss, sinus pain and tinnitus.        Ear popping to the Left   Eyes: Negative.   Respiratory: Negative.   Cardiovascular: Negative.   Gastrointestinal: Negative.   Genitourinary: Negative.   Musculoskeletal: Negative.   Neurological: Negative for weakness.       Soreness back of head/neck  Psychiatric/Behavioral: Negative.     Health Maintenance  Topic Date Due  . INFLUENZA VACCINE  09/26/2016  . TETANUS/TDAP  05/02/2022  . DEXA SCAN  Completed  . PNA vac Low Risk Adult  Completed    Physical Exam: Physical Exam  Constitutional: She is oriented to person, place, and time. She appears well-developed and well-nourished.  HENT:  Head: Normocephalic and atraumatic.  Right Ear: External ear normal.  Left  Ear: External ear normal.  Eyes: Conjunctivae are normal.  Neck: Normal range of motion. Neck supple.  Cardiovascular: Normal rate, regular rhythm, normal heart sounds and intact distal pulses.  Pulmonary/Chest: Effort normal and breath sounds normal.  Abdominal: Soft. Bowel sounds are normal.  Musculoskeletal: Normal range of motion.  Neurological: She is alert and oriented to person, place, and time.  Skin: Skin is warm and dry.  Psychiatric: She has a normal mood and affect. Her behavior is normal. Judgment and thought content normal.  Vitals reviewed.   Labs reviewed: Basic Metabolic  Panel: Recent Labs    06/06/16 1112 08/21/16 0903  NA 141 140  K 4.0 4.6  CL  --  103  CO2 28 30  GLUCOSE 82 90  BUN 18.7 17  CREATININE 0.8 0.81  CALCIUM 9.2 9.2  TSH  --  0.41   Liver Function Tests: Recent Labs    06/06/16 1112 08/21/16 0903  AST 20 17  ALT 15 10  ALKPHOS 36* 32*  BILITOT 0.42 0.4  PROT 6.4 5.9*  ALBUMIN 3.7 3.6   No results for input(s): LIPASE, AMYLASE in the last 8760 hours. No results for input(s): AMMONIA in the last 8760 hours. CBC: Recent Labs    07/06/16 1416 08/21/16 0903 08/27/16 1258  WBC 6.5 6.0 6.7  NEUTROABS 3.2 2,400 3.7  HGB 10.6* 11.4* 11.0*  HCT 32.9* 36.9 35.1  MCV 80.6 83.5 83.2  PLT 233.0 209 173   Lipid Panel: No results for input(s): CHOL, HDL, LDLCALC, TRIG, CHOLHDL, LDLDIRECT in the last 8760 hours. No results found for: HGBA1C  Procedures since last visit: No results found.  Assessment/Plan 1. Need for immunization against influenza She had not been back to the office yet for the flu shot. Will provided today. She has not had reactions in the past to it.   - Flu vaccine HIGH DOSE PF (Fluzone High dose)  2. Hypothyroidism due to acquired atrophy of thyroid She is stable at this time per her last labs she was clinically euthyroid.  Will maintain her current dose of synthroid. Will consider a reduction based on this  next TSH level.   - TSH  3. Iron deficiency anemia due to chronic blood loss She received two iron infusions July and August of 2018 at the cancer center. She states she has felt much better post the infusions. Will follow up today and make sure she is stable.   - CBC with Differential/Platelet - Basic metabolic panel  4. Hearing loss of left ear due to cerumen impaction She had noted some popping in her Left ear, upon examination TM could not be visualized due to cerumen impaction. There was attempts to irrigate and remove the cerumen, this was partial successful. Advise her to get some OTC debrox to help keep wax softened. Ear canals are smaller, was not able to fully visualize the TM even post removal of half the cerumen. Advised if the popping continues to return to clinic.   5. Malignant neoplasm of upper-outer quadrant of right breast in female, estrogen receptor positive ([place]) She is to continue the tamoxifen therapy for a full 5 years and will maintian follow up with heme/oncology. Advised about maintain her 6 month mammograms.     Labs/tests ordered:  Orders Placed This Encounter  Procedures  . Flu vaccine HIGH DOSE PF (Fluzone High dose)  . TSH  . CBC with Differential/Platelet  . Basic metabolic panel    Order Specific Question:   Has the patient fasted?    Answer:   Yes    Next appt:  Visit date not found    Karen Kays, MSN, RN, DNP [facility] [phone], [place] [address] Cell Phone (Mon-Fri 8am-5pm):  [phone] On Call:  [phone] & follow prompts after 5pm & weekends Office Phone:  [phone] Office Fax:  [phone]

## 2017-03-18 NOTE — Patient Instructions (Signed)
Buy some debrox drops and put 5 drops in each ear at bedtime once a week to keep wax moist and loose.

## 2017-03-20 ENCOUNTER — Ambulatory Visit: Payer: Self-pay | Admitting: Internal Medicine

## 2017-03-25 ENCOUNTER — Telehealth: Payer: Self-pay | Admitting: *Deleted

## 2017-03-25 NOTE — Telephone Encounter (Signed)
-----   Message from [name], DO sent at 03/24/2017  8:49 AM EST ----- Alice [name] left w/o getting her labs.  Cbc, bmp and tsh.  Can we make her a lab appt to return for this?  She also may not have made her f/u visit.

## 2017-03-25 NOTE — Telephone Encounter (Signed)
.  left message to have patient return my call.  

## 2017-03-26 NOTE — Telephone Encounter (Signed)
appointment and labs scheduled

## 2017-03-27 ENCOUNTER — Other Ambulatory Visit: Payer: Medicare Other

## 2017-03-27 ENCOUNTER — Other Ambulatory Visit: Payer: Self-pay

## 2017-03-27 DIAGNOSIS — D500 Iron deficiency anemia secondary to blood loss (chronic): Secondary | ICD-10-CM | POA: Diagnosis not present

## 2017-03-27 DIAGNOSIS — H04563 Stenosis of bilateral lacrimal punctum: Secondary | ICD-10-CM | POA: Diagnosis not present

## 2017-03-27 DIAGNOSIS — E034 Atrophy of thyroid (acquired): Secondary | ICD-10-CM | POA: Diagnosis not present

## 2017-03-27 DIAGNOSIS — H04223 Epiphora due to insufficient drainage, bilateral lacrimal glands: Secondary | ICD-10-CM | POA: Diagnosis not present

## 2017-03-27 LAB — CBC WITH DIFFERENTIAL/PLATELET
Basophils Absolute: 48 cells/uL (ref 0–200)
Basophils Relative: 0.7 %
Eosinophils Absolute: 258 cells/uL (ref 15–500)
Eosinophils Relative: 3.8 %
HCT: 41 % (ref 35.0–45.0)
Hemoglobin: 13.7 g/dL (ref 11.7–15.5)
Lymphs Abs: 3128 cells/uL (ref 850–3900)
MCH: 31 pg (ref 27.0–33.0)
MCHC: 33.4 g/dL (ref 32.0–36.0)
MCV: 92.8 fL (ref 80.0–100.0)
MPV: 10.3 fL (ref 7.5–12.5)
Monocytes Relative: 8.1 %
Neutro Abs: 2815 cells/uL (ref 1500–7800)
Neutrophils Relative %: 41.4 %
Platelets: 183 10*3/uL (ref 140–400)
RBC: 4.42 10*6/uL (ref 3.80–5.10)
RDW: 12.4 % (ref 11.0–15.0)
Total Lymphocyte: 46 %
WBC mixed population: 551 cells/uL (ref 200–950)
WBC: 6.8 10*3/uL (ref 3.8–10.8)

## 2017-03-27 LAB — BASIC METABOLIC PANEL
BUN: 22 mg/dL (ref 7–25)
CO2: 32 mmol/L (ref 20–32)
Calcium: 9.9 mg/dL (ref 8.6–10.4)
Chloride: 102 mmol/L (ref 98–110)
Creat: 0.84 mg/dL (ref 0.60–0.88)
Glucose, Bld: 88 mg/dL (ref 65–99)
Potassium: 4.1 mmol/L (ref 3.5–5.3)
Sodium: 141 mmol/L (ref 135–146)

## 2017-03-27 LAB — TSH: TSH: 1.8 mIU/L (ref 0.40–4.50)

## 2017-03-28 ENCOUNTER — Encounter: Payer: Self-pay | Admitting: *Deleted

## 2017-04-11 ENCOUNTER — Other Ambulatory Visit: Payer: Self-pay | Admitting: Internal Medicine

## 2017-04-11 MED ORDER — LORAZEPAM 0.5 MG PO TABS: ORAL_TABLET | ORAL | 0 refills | Status: DC

## 2017-04-11 NOTE — Telephone Encounter (Signed)
A medication refill was received from pharmacy for lorazepam 0.5 mg. Rx was called in to pharmacy after verifying last fill date, provider, and quantity on [place] database.   

## 2017-04-18 DIAGNOSIS — H02135 Senile ectropion of left lower eyelid: Secondary | ICD-10-CM | POA: Diagnosis not present

## 2017-04-18 DIAGNOSIS — H02885 Meibomian gland dysfunction left lower eyelid: Secondary | ICD-10-CM | POA: Diagnosis not present

## 2017-04-18 DIAGNOSIS — H02132 Senile ectropion of right lower eyelid: Secondary | ICD-10-CM | POA: Diagnosis not present

## 2017-04-18 DIAGNOSIS — H04563 Stenosis of bilateral lacrimal punctum: Secondary | ICD-10-CM | POA: Diagnosis not present

## 2017-04-18 DIAGNOSIS — H04551 Acquired stenosis of right nasolacrimal duct: Secondary | ICD-10-CM | POA: Diagnosis not present

## 2017-04-18 DIAGNOSIS — H04222 Epiphora due to insufficient drainage, left lacrimal gland: Secondary | ICD-10-CM | POA: Diagnosis not present

## 2017-04-18 DIAGNOSIS — H04221 Epiphora due to insufficient drainage, right lacrimal gland: Secondary | ICD-10-CM | POA: Diagnosis not present

## 2017-04-18 DIAGNOSIS — H04123 Dry eye syndrome of bilateral lacrimal glands: Secondary | ICD-10-CM | POA: Diagnosis not present

## 2017-04-18 DIAGNOSIS — H02882 Meibomian gland dysfunction right lower eyelid: Secondary | ICD-10-CM | POA: Diagnosis not present

## 2017-04-18 DIAGNOSIS — H04223 Epiphora due to insufficient drainage, bilateral lacrimal glands: Secondary | ICD-10-CM | POA: Diagnosis not present

## 2017-04-18 DIAGNOSIS — H04552 Acquired stenosis of left nasolacrimal duct: Secondary | ICD-10-CM | POA: Diagnosis not present

## 2017-05-24 ENCOUNTER — Other Ambulatory Visit: Payer: Self-pay | Admitting: *Deleted

## 2017-05-24 ENCOUNTER — Telehealth: Payer: Self-pay | Admitting: Internal Medicine

## 2017-05-24 MED ORDER — LORAZEPAM 0.5 MG PO TABS: ORAL_TABLET | ORAL | 0 refills | Status: DC

## 2017-05-24 NOTE — Telephone Encounter (Signed)
Patient requested refill [place] Verified Phoned Rx to pharmacy.

## 2017-05-24 NOTE — Telephone Encounter (Signed)
Pt called to see if we had found a pocket book, we told we had and she said she would be bye to pick it up.

## 2017-05-24 NOTE — Telephone Encounter (Signed)
LVM to let pt know that her pocket book was found in the lobby

## 2017-05-25 ENCOUNTER — Other Ambulatory Visit: Payer: Self-pay | Admitting: Internal Medicine

## 2017-06-04 ENCOUNTER — Telehealth: Payer: Self-pay | Admitting: Oncology

## 2017-06-04 NOTE — Telephone Encounter (Signed)
Spoke to patient regarding upcoming appointment updates.

## 2017-06-11 ENCOUNTER — Other Ambulatory Visit: Payer: Self-pay

## 2017-06-11 DIAGNOSIS — C50411 Malignant neoplasm of upper-outer quadrant of right female breast: Secondary | ICD-10-CM

## 2017-06-12 ENCOUNTER — Encounter (HOSPITAL_COMMUNITY): Payer: Self-pay

## 2017-06-12 ENCOUNTER — Inpatient Hospital Stay: Payer: Medicare Other | Attending: Oncology

## 2017-06-12 ENCOUNTER — Other Ambulatory Visit: Payer: Medicare Other

## 2017-06-12 ENCOUNTER — Ambulatory Visit (HOSPITAL_COMMUNITY)
Admission: RE | Admit: 2017-06-12 | Discharge: 2017-06-12 | Disposition: A | Discharge status: Home / Self Care | Payer: Medicare Other | Source: Ambulatory Visit | Attending: Oncology | Admitting: Oncology

## 2017-06-12 DIAGNOSIS — Z79899 Other long term (current) drug therapy: Secondary | ICD-10-CM | POA: Diagnosis not present

## 2017-06-12 DIAGNOSIS — Z7982 Long term (current) use of aspirin: Secondary | ICD-10-CM | POA: Insufficient documentation

## 2017-06-12 DIAGNOSIS — Z923 Personal history of irradiation: Secondary | ICD-10-CM | POA: Insufficient documentation

## 2017-06-12 DIAGNOSIS — Z9221 Personal history of antineoplastic chemotherapy: Secondary | ICD-10-CM | POA: Insufficient documentation

## 2017-06-12 DIAGNOSIS — E7800 Pure hypercholesterolemia, unspecified: Secondary | ICD-10-CM | POA: Insufficient documentation

## 2017-06-12 DIAGNOSIS — E039 Hypothyroidism, unspecified: Secondary | ICD-10-CM | POA: Insufficient documentation

## 2017-06-12 DIAGNOSIS — C50411 Malignant neoplasm of upper-outer quadrant of right female breast: Secondary | ICD-10-CM | POA: Insufficient documentation

## 2017-06-12 DIAGNOSIS — D509 Iron deficiency anemia, unspecified: Secondary | ICD-10-CM | POA: Insufficient documentation

## 2017-06-12 DIAGNOSIS — I2510 Atherosclerotic heart disease of native coronary artery without angina pectoris: Secondary | ICD-10-CM | POA: Diagnosis not present

## 2017-06-12 DIAGNOSIS — Z170 Estrogen receptor positive status [ER+]: Secondary | ICD-10-CM | POA: Insufficient documentation

## 2017-06-12 DIAGNOSIS — C50911 Malignant neoplasm of unspecified site of right female breast: Secondary | ICD-10-CM | POA: Diagnosis not present

## 2017-06-12 DIAGNOSIS — Z87891 Personal history of nicotine dependence: Secondary | ICD-10-CM | POA: Diagnosis not present

## 2017-06-12 DIAGNOSIS — I700 Atherosclerosis of aorta: Secondary | ICD-10-CM | POA: Insufficient documentation

## 2017-06-12 DIAGNOSIS — Z79810 Long term (current) use of selective estrogen receptor modulators (SERMs): Secondary | ICD-10-CM | POA: Diagnosis not present

## 2017-06-12 DIAGNOSIS — M5134 Other intervertebral disc degeneration, thoracic region: Secondary | ICD-10-CM | POA: Insufficient documentation

## 2017-06-12 LAB — CMP (CANCER CENTER ONLY)
ALT: 16 U/L (ref 0–55)
AST: 22 U/L (ref 5–34)
Albumin: 3.8 g/dL (ref 3.5–5.0)
Alkaline Phosphatase: 36 U/L — ABNORMAL LOW (ref 40–150)
Anion gap: 6 (ref 3–11)
BUN: 21 mg/dL (ref 7–26)
CO2: 28 mmol/L (ref 22–29)
Calcium: 9.2 mg/dL (ref 8.4–10.4)
Chloride: 105 mmol/L (ref 98–109)
Creatinine: 0.84 mg/dL (ref 0.60–1.10)
GFR, Est AFR Am: >60 mL/min
GFR, Estimated: >60 mL/min
Glucose, Bld: 102 mg/dL (ref 70–140)
Potassium: 4 mmol/L (ref 3.5–5.1)
Sodium: 139 mmol/L (ref 136–145)
Total Bilirubin: 0.4 mg/dL (ref 0.2–1.2)
Total Protein: 6.2 g/dL — ABNORMAL LOW (ref 6.4–8.3)

## 2017-06-12 LAB — CBC WITH DIFFERENTIAL (CANCER CENTER ONLY)
BASOS ABS: 0 10*3/uL (ref 0.0–0.1)
BASOS PCT: 1 %
EOS PCT: 3 %
Eosinophils Absolute: 0.2 10*3/uL (ref 0.0–0.5)
HCT: 39.7 % (ref 34.8–46.6)
Hemoglobin: 13.1 g/dL (ref 11.6–15.9)
Lymphocytes Relative: 34 %
Lymphs Abs: 2 10*3/uL (ref 0.9–3.3)
MCH: 31.4 pg (ref 25.1–34.0)
MCHC: 33 g/dL (ref 31.5–36.0)
MCV: 95.2 fL (ref 79.5–101.0)
MONO ABS: 0.4 10*3/uL (ref 0.1–0.9)
Monocytes Relative: 6 %
NEUTROS ABS: 3.4 10*3/uL (ref 1.5–6.5)
Neutrophils Relative %: 56 %
Platelet Count: 142 10*3/uL — ABNORMAL LOW (ref 145–400)
RBC: 4.17 MIL/uL (ref 3.70–5.45)
RDW: 13.7 % (ref 11.2–14.5)
WBC Count: 6 10*3/uL (ref 3.9–10.3)

## 2017-06-12 MED ORDER — IOHEXOL 300 MG/ML  SOLN
75.0000 mL | Freq: Once | INTRAMUSCULAR | Status: AC | PRN
  Administered 2017-06-12: 75 mL via INTRAVENOUS

## 2017-06-17 NOTE — Progress Notes (Signed)
ID: Alice [name]   DOB: [birth year]  MR#: [id number]  LTJ#:[id number]   Alice Curry, DO SU:  Alice Seltzer, MD;  OTHER:  Alice Silversmith, MD; Alice Rusk, MD, Alice Ramal NP, Alice Beckers MD    CHIEF COMPLAINT:  Right Breast Cancer  CURRENT THERAPY: Tamoxifen   HISTORY OF PRESENT ILLNESS: From the original intake note:  Alice [name] noted a mass in her right breast late December of 2011.  She brought this to Dr. Matthias [name] attention and he set her up for mammography at [place] in [facility].  This showed a large spiculated mass in the posterior upper outer quadrant of the right breast which was palpable.  By ultrasound it measured 3.6 cm.  It was hypoechoic with internal blood flow.  Biopsy was suggested and performed January 17th, 2012.  The pathology from that biopsy ([id number]) showed an invasive ductal carcinoma, Grade 3, ER 95% and PR 47% positive with an MIB-1 of 50%, no HER2 amplification with a CISH ratio of 1.63.    With this information, the patient was referred to Dr. Barry [name], and bilateral breast MRIs were obtained March 20, 2010.  This showed in the posterior 3rd of the outer right breast a 3.8 cm mass with a clip artifact and in addition in the axillary tail of the right breast, a 9 mm rounded mildly irregular mass, corresponding to an abnormal appearing lymph node seen at mammography.  There were no other areas suspicious for enhancement.     Her subsequent history is as summarized below  INTERVAL HISTORY: Alice [name] is here today for follow up of her breast cancer. She is accompanied by her lovely service dog/significant other, Alice [name]. She has been found to be iron deficient, and is here to discus IV iron since she cannot tolerate oral iron. She continues on tamoxifen, with good tolerance. She denies issues with hot flashes and vaginal dryness is not a major issue.   Since her last visit, she underwent diagnostic unilateral left breast mammography with CAD  and tomography on 12/07/2016 at [place] showing: breast density category C. Stable probably benign left breast asymmetry. Bilateral diagnostic mammography in 6 months was recommended.  She also completed a chest CT on 06/12/2017 showing: Stable exam. No evidence to suggest residual or recurrent tumor or metastatic disease within the chest. Aortic Atherosclerosis (ICD10-I70.0). Three vessel coronary artery atherosclerotic calcifications noted.   REVIEW OF SYSTEMS: Alice [name] reports that she is helping a friend with various medical issues. This has been a little stressful for her due to his medical demands and personality. For exercise, she walks Alice [name].  She denies unusual headaches, visual changes, nausea, vomiting, or dizziness. There has been no unusual cough, phlegm production, or pleurisy. This been no change in bowel or bladder habits. She denies unexplained fatigue or unexplained weight loss, bleeding, rash, or fever. A detailed review of systems was otherwise stable.    PAST MEDICAL HISTORY: Past Medical History:  Diagnosis Date  . Alopecia   . Breast cancer ([place])     Right , chemo, radiation  . DDD (degenerative disc disease)   . Heart palpitations   . History of diverticulitis   . History of diverticulitis of colon   . Hypercholesteremia   . Hypothyroidism   . Insect bites   . Internal and external bleeding hemorrhoids 08/11/2015  . Personal history of chemotherapy 2012  . Personal history of radiation therapy 2012  . Scoliosis    L4  L5  . Seasonal allergies   . SUI (stress urinary incontinence, female)   1. Greater than 30 pack-year tobacco abuse, the patient quitting in the sixties.  2. History of sling procedure. 3. Seasonal allergies. 4. Status post hernia repair under Dr. Rise Patience. 5. Status post small bowel resection for diverticular disease in 2001.  6. Status post left cataract surgery. 7. History of tonsillectomy. 8. History of palpitations.    9. History of degenerative disk disease. 10. Hypothyroidism. 11. Hypercholesterolemia. 12. Hypertension. Stress urinary incontinence  PAST SURGICAL HISTORY: Past Surgical History:  Procedure Laterality Date  . BLADDER SUSPENSION    . bowel rescetion    . BOWEL RESECTION    . BREAST BIOPSY Right 03/14/2010  . BREAST LUMPECTOMY  2012   right  . CATARACT EXTRACTION     left  . COLON SURGERY  20 YRS AGO   BOWEL RESECTION  . EYE SURGERY    . HEMORRHOID BANDING    . HERNIA REPAIR     RIH, Dr. Rise Patience  . JOINT REPLACEMENT    . PORT-A-CATH REMOVAL    . TONSILLECTOMY  2001   Diverticulitis  . TOTAL HIP ARTHROPLASTY Right 09/10/2014   Procedure: RIGHT TOTAL HIP ARTHROPLASTY ANTERIOR APPROACH;  Surgeon: Mcarthur Rossetti, MD;  Location: [facility];  Service: Orthopedics;  Laterality: Right;    FAMILY HISTORY The patient's father died from a stroke at the age of 47.  The patient's mother died with congestive heart failure at the age of 20.  The patient has one sister who died with non-Hodgkin's lymphoma at the age of 56.  She has a brother who was a Manufacturing engineer and died from unknown causes.    GYNECOLOGIC HISTORY: She is GX P3, first pregnancy to term at age 54.  She had menarche at age 50.  She went through menopause in her fifties. She took hormone replacement until the time of her breast cancer diagnosis  SOCIAL HISTORY:  (Updated 12/08/2012) She worked as a Designer, jewellery. She is divorced and lives by herself with her canine companion, Alice [name], who is a [name] terrier and works as a Chief Technology Officer. Son Alice [name] lives in [place] where he works in Engineer, technical sales.  He is the patient's healthcare power of attorney and may be reached at [phone].  The patient's son Alice [name] also works in IT in the [place] area.  Son Alice [name] Child psychotherapist was Scientist, physiological of [employer] but is currently suing [employer] which has let him go. He is stillt teaching there, however. The  patient has 5 grandchildren and 2 great-grandchildren. She attends [employer].      ADVANCED DIRECTIVES: In place  HEALTH MAINTENANCE: (Updated 12/08/2012) Social History   Tobacco Use  . Smoking status: Former Smoker    Last attempt to quit: 08/09/1964    Years since quitting: 52.8  . Smokeless tobacco: Never Used  Substance Use Topics  . Alcohol use: Yes    Comment: RARE  . Drug use: No    Colonoscopy: never  PAP: 2001  Bone density: July 2014, normal  Lipid panel: Followed by Dr. Velna Hatchet   Allergies  Allergen Reactions  . Amoxicillin Rash  . Diphenhydramine Rash    "It is the red dye in Benadryl that I''m allergic to"  . Naproxen Sodium Rash    "It is the blue dye in naproxen that I'm allergic to."  . Lipitor [Atorvastatin]   . Meclizine Other (See Comments)  Worsening dizziness and blurred vision  . Amlodipine Other (See Comments)    Dizzy  . Cefdinir Other (See Comments)    Vertigo and headaches   . Codeine Other (See Comments)    unknown  . Ferrous Sulfate     "tears belly up"  . Methocarbamol Other (See Comments)    insomnia   . Prednisone Other (See Comments)    vertigo and headaches  . Skelaxin [Metaxalone] Other (See Comments)    Gastric upset.   . Tramadol Nausea Only  . Hctz [Hydrochlorothiazide] Itching    Current Outpatient Medications  Medication Sig Dispense Refill  . aspirin EC 81 MG tablet Take 81 mg by mouth.    . fish oil-omega-3 fatty acids 1000 MG capsule Take 2 g by mouth 2 (two) times daily.     Marland Kitchen lidocaine (XYLOCAINE) 5 % ointment Apply 1 application topically 3 (three) times daily as needed. For skin [name] 30 g 0  . LORazepam (ATIVAN) 0.5 MG tablet Take one tablet daily as needed 30 tablet 0  . loteprednol (LOTEMAX) 0.5 % ophthalmic suspension Place 1 drop into both eyes as needed. 10 mL 5  . mometasone (ELOCON) 0.1 % cream Apply 1 application topically daily as needed. For severe itching 50 g 3  . naproxen  sodium (ANAPROX) 220 MG tablet Take 220 mg by mouth 2 (two) times daily.    Marland Kitchen neomycin-bacitracin-polymyxin (NEOSPORIN) ointment Apply 1 application topically daily as needed for wound care. Reported on 03/12/2015    . omeprazole (PRILOSEC) 40 MG capsule TAKE 1 CAPSULE DAILY 90 capsule 3  . SYNTHROID 100 MCG tablet TAKE 1 TABLET DAILY BEFORE BREAKFAST 90 tablet 1  . tamoxifen (NOLVADEX) 20 MG tablet Take 1 tablet (20 mg total) by mouth daily. 90 tablet 4   No current facility-administered medications for this visit.     OBJECTIVE: Older white woman in no acute distress  Vitals:   06/19/17 1319  BP: 122/90  Pulse: 90  Resp: 17  Temp: 97.7 F (36.5 C)  SpO2: 99%     Body mass index is 24.88 kg/m.    ECOG FS: 0 Filed Weights   06/19/17 1319  Weight: 131 lb 11.2 oz (59.7 kg)   Sclerae unicteric, EOMs intact Oropharynx clear and moist No cervical or supraclavicular adenopathy Lungs no rales or rhonchi Heart regular rate and rhythm Abd soft, nontender, positive bowel sounds MSK no focal spinal tenderness, no upper extremity lymphedema Neuro: nonfocal, well oriented, appropriate affect Breasts: The right breast is status post lumpectomy.  There is no evidence of local recurrence.  Left breast is benign.  Both axillae are benign.   LAB RESULTS: Lab Results  Component Value Date   WBC 6.0 06/12/2017   NEUTROABS 3.4 06/12/2017   HGB 13.1 06/12/2017   HCT 39.7 06/12/2017   MCV 95.2 06/12/2017   PLT 142 (L) 06/12/2017      Chemistry      Component Value Date/Time   NA 139 06/12/2017 1305   NA 141 06/06/2016 1112   K 4.0 06/12/2017 1305   K 4.0 06/06/2016 1112   CL 105 06/12/2017 1305   CL 104 08/04/2012 1350   CO2 28 06/12/2017 1305   CO2 28 06/06/2016 1112   BUN 21 06/12/2017 1305   BUN 18.7 06/06/2016 1112   CREATININE 0.84 06/12/2017 1305   CREATININE 0.84 03/27/2017 0914   CREATININE 0.8 06/06/2016 1112   GLU 91 10/21/2014      Component Value Date/Time  CALCIUM 9.2 06/12/2017 1305   CALCIUM 9.2 06/06/2016 1112   ALKPHOS 36 (L) 06/12/2017 1305   ALKPHOS 36 (L) 06/06/2016 1112   AST 22 06/12/2017 1305   AST 20 06/06/2016 1112   ALT 16 06/12/2017 1305   ALT 15 06/06/2016 1112   BILITOT 0.4 06/12/2017 1305   BILITOT 0.42 06/06/2016 1112      STUDIES: Ct Chest W Contrast  Result Date: 06/13/2017 CLINICAL DATA:  Right breast cancer.  Follow-up. EXAM: CT CHEST WITH CONTRAST TECHNIQUE: Multidetector CT imaging of the chest was performed during intravenous contrast administration. CONTRAST:  46m OMNIPAQUE IOHEXOL 300 MG/ML  SOLN COMPARISON:  03/30/2010 FINDINGS: Cardiovascular: Heart size is normal. No pericardial effusion. Aortic atherosclerosis. Calcifications within the left main coronary artery, LAD and RCA identified. Mediastinum/Nodes: Normal appearance of the thyroid gland. The trachea appears patent and is midline. Moderate size hiatal hernia identified. Calcified subcarinal lymph node is again identified. No enlarged mediastinal or hilar lymph nodes. No axillary or supraclavicular adenopathy. Lungs/Pleura: No pleural effusion. No airspace consolidation, atelectasis or pneumothorax. No suspicious pulmonary nodules or masses identified. Upper Abdomen: 8 mm low-attenuation structure within segment 8 is identified. This was present on previous exam measuring 5 mm. Likely slow-growing benign cyst. Simple appearing cyst within the posterior right lobe is again identified. No suspicious liver abnormalities. Chronic increase caliber of the common bile duct is identified. Calcified granulomas identified within the spleen. Musculoskeletal: There is degenerative disc disease throughout the thoracic spine. No suspicious bone lesions. Treated right breast lesion measures 1.4 cm with surrounding clips. Previously 2.8 cm. IMPRESSION: 1. Stable exam. No evidence to suggest residual or recurrent tumor or metastatic disease within the chest. 2. Aortic  Atherosclerosis (ICD10-I70.0). Three vessel coronary artery atherosclerotic calcifications noted. Electronically Signed   By: TKerby MoorsM.D.   On: 06/13/2017 10:46   Since her last visit, she underwent diagnostic unilateral left breast mammography with CAD [facility] breast density category C. Stable probably benign left breast asymmetry. Bilateral diagnostic mammography in 6 months was recommended.   ASSESSMENT: 84y.o. [place] woman,   (1)  status post right breast biopsy March 17, 2010 for a grade 3 invasive ductal carcinoma measuring 3.8 cm by MRI with a suspicious right axillary lymph node which was, however, negative by biopsy, the tumor being estrogen and progesterone receptor positive, HER2-negative, with an MIB-1 of 50%.    (2)  Neoadjuvantly, she was treated with dose-dense doxorubicin and cyclophosphamide x4 followed by weekly paclitaxel x 12, completed July 2012 followed by definitive right lumpectomy and sentinel lymph node sampling August 2012 showing a complete pathologic response.   (3)  She completed radiation treatments November 2012 and started tamoxifen at that time.  (4)  discontinued tamoxifen in approximately July of 2014, and was switched to anastrozole for approximately 2 weeks between 10/31/2012 and 11/12/2012,  discontinued secondary to bone and muscle [name].  (5)  started letrozole October 2014, discontinued because of headaches, nausea, vomiting, and aches and pains.  (6) resumed tamoxifen November 2014, plan is to continue that for 10 years  (7) iron deficiency anemia: Received Feraheme 09/20/2016 and 10/05/2016  PLAN:  DWanedais now nearly 7 years out from definitive surgery for her breast cancer with no evidence of disease recurrence.  This is very favorable.  She is tolerating tamoxifen well and the plan will be to continue that for a total of 10 years.  She did have an iron deficiency problem  which has  been corrected.  Her hemoglobin currently is fine.  We will continue to check her ferritin on a yearly basis  With regards to her current human companion I suggested a terminal date and gave her to options on how to meet better prospects  She will see me again in a year.  She knows to call for any other issues that may develop before then.    Glendi Mohiuddin, Virgie Dad, MD  06/19/17 1:55 PM Medical Oncology and Hematology [facility] [address] [place], [place] [address] Tel. [phone]    Fax. [phone]  This document serves as a record of services personally performed by Lurline Del, MD. It was created on his behalf by Sheron Nightingale, a trained medical scribe. The creation of this record is based on the scribe's personal observations and the provider's statements to them.   I have reviewed the above documentation for accuracy and completeness, and I agree with the above.

## 2017-06-19 ENCOUNTER — Inpatient Hospital Stay (HOSPITAL_BASED_OUTPATIENT_CLINIC_OR_DEPARTMENT_OTHER): Payer: Medicare Other | Admitting: Oncology

## 2017-06-19 VITALS — BP 122/90 | HR 90 | Temp 97.7°F | Resp 17 | Ht 61.0 in | Wt 131.7 lb

## 2017-06-19 DIAGNOSIS — D509 Iron deficiency anemia, unspecified: Secondary | ICD-10-CM | POA: Diagnosis not present

## 2017-06-19 DIAGNOSIS — Z9221 Personal history of antineoplastic chemotherapy: Secondary | ICD-10-CM | POA: Diagnosis not present

## 2017-06-19 DIAGNOSIS — C50411 Malignant neoplasm of upper-outer quadrant of right female breast: Secondary | ICD-10-CM | POA: Diagnosis not present

## 2017-06-19 DIAGNOSIS — I700 Atherosclerosis of aorta: Secondary | ICD-10-CM

## 2017-06-19 DIAGNOSIS — Z170 Estrogen receptor positive status [ER+]: Secondary | ICD-10-CM | POA: Diagnosis not present

## 2017-06-19 DIAGNOSIS — M5134 Other intervertebral disc degeneration, thoracic region: Secondary | ICD-10-CM | POA: Diagnosis not present

## 2017-06-19 DIAGNOSIS — E039 Hypothyroidism, unspecified: Secondary | ICD-10-CM | POA: Diagnosis not present

## 2017-06-19 DIAGNOSIS — Z79810 Long term (current) use of selective estrogen receptor modulators (SERMs): Secondary | ICD-10-CM | POA: Diagnosis not present

## 2017-06-19 DIAGNOSIS — Z923 Personal history of irradiation: Secondary | ICD-10-CM | POA: Diagnosis not present

## 2017-06-19 DIAGNOSIS — Z79899 Other long term (current) drug therapy: Secondary | ICD-10-CM

## 2017-06-19 DIAGNOSIS — Z7982 Long term (current) use of aspirin: Secondary | ICD-10-CM | POA: Diagnosis not present

## 2017-06-19 DIAGNOSIS — E7800 Pure hypercholesterolemia, unspecified: Secondary | ICD-10-CM | POA: Diagnosis not present

## 2017-06-19 DIAGNOSIS — Z87891 Personal history of nicotine dependence: Secondary | ICD-10-CM

## 2017-06-19 MED ORDER — TAMOXIFEN CITRATE 20 MG PO TABS: 20.0000 mg | ORAL_TABLET | Freq: Every day | ORAL | 4 refills | Status: DC

## 2017-06-26 ENCOUNTER — Other Ambulatory Visit: Payer: Self-pay | Admitting: Internal Medicine

## 2017-06-27 MED ORDER — LORAZEPAM 0.5 MG PO TABS: ORAL_TABLET | ORAL | 0 refills | Status: DC

## 2017-06-27 NOTE — Telephone Encounter (Signed)
A medication refill was received from pharmacy for lorazepam 0.5 mg. Rx was called in to pharmacy after verifying last fill date, provider, and quantity on [place] database.   

## 2017-08-04 ENCOUNTER — Other Ambulatory Visit: Payer: Self-pay | Admitting: Internal Medicine

## 2017-08-05 MED ORDER — LORAZEPAM 0.5 MG PO TABS: ORAL_TABLET | ORAL | 0 refills | Status: DC

## 2017-08-05 NOTE — Telephone Encounter (Signed)
A medication refill was received from pharmacy for lorazepam 0.5 mg. Rx was called in to pharmacy after verifying last fill date, provider, and quantity on [place] database.   

## 2017-09-06 ENCOUNTER — Other Ambulatory Visit: Payer: Self-pay | Admitting: Internal Medicine

## 2017-09-06 MED ORDER — LORAZEPAM 0.5 MG PO TABS: ORAL_TABLET | ORAL | 0 refills | Status: DC

## 2017-09-06 NOTE — Telephone Encounter (Signed)
A medication refill was received via mychart for lorazepam 0.5 mg. Rx was faxed to [facility] at [phone] after verifying last fill date, provider, and quantity on PMP [place].

## 2017-09-19 ENCOUNTER — Encounter: Payer: Self-pay | Admitting: Internal Medicine

## 2017-09-19 ENCOUNTER — Ambulatory Visit (INDEPENDENT_AMBULATORY_CARE_PROVIDER_SITE_OTHER): Payer: Medicare Other | Admitting: Internal Medicine

## 2017-09-19 VITALS — BP 160/80 | HR 78 | Temp 98.1°F | Resp 20 | Ht 61.0 in | Wt 130.2 lb

## 2017-09-19 DIAGNOSIS — E2839 Other primary ovarian failure: Secondary | ICD-10-CM

## 2017-09-19 DIAGNOSIS — Z780 Asymptomatic menopausal state: Secondary | ICD-10-CM | POA: Diagnosis not present

## 2017-09-19 DIAGNOSIS — Z5181 Encounter for therapeutic drug level monitoring: Secondary | ICD-10-CM

## 2017-09-19 DIAGNOSIS — D500 Iron deficiency anemia secondary to blood loss (chronic): Secondary | ICD-10-CM

## 2017-09-19 DIAGNOSIS — Z79810 Long term (current) use of selective estrogen receptor modulators (SERMs): Secondary | ICD-10-CM | POA: Diagnosis not present

## 2017-09-19 DIAGNOSIS — Z170 Estrogen receptor positive status [ER+]: Secondary | ICD-10-CM | POA: Diagnosis not present

## 2017-09-19 DIAGNOSIS — C50411 Malignant neoplasm of upper-outer quadrant of right female breast: Secondary | ICD-10-CM

## 2017-09-19 DIAGNOSIS — E034 Atrophy of thyroid (acquired): Secondary | ICD-10-CM | POA: Diagnosis not present

## 2017-09-19 NOTE — Progress Notes (Signed)
Location:  [facility] clinic Provider:  Garnet Overfield L. Mariea Clonts, D.O., C.M.D.  Code Status: DNR Goals of Care:  Advanced Directives 09/23/2016  Does Patient Have a Medical Advance Directive? Yes  Type of Paramedic of Alice [name];Living will  Does patient want to make changes to medical advance directive? No - Patient declined  Copy of [place] in Chart? No - copy requested   Chief Complaint  Patient presents with  . Medical Management of Chronic Issues    6 mo f/u- hypothyroidism, iron defiency,     HPI: Patient is a 84 y.o. female seen today for medical management of chronic diseases.  Comes to her appts with her service dog, Alice [name].    BP high off and on at appts, but ok at home per pt.  Admits to some h/o white coat syndrome.  Takes her bp at the pharmacy and it ranges in the 130s typically.    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  He kept her very busy.  He called the police and told them Alice [name] had kidnapped him.  She got him admitted to assisted living, but he checked himself out and went to Alice [name]. He is now wanting to go back to AL here in [place].  He has some memory loss but is not a Animator.  She asked [place] for a list of area facilities.  Her main proteins are cottage cheese and cheese, five guys cheeseburgers occasionally.  She does not eat a healthy diet, but is not into cooking for one.  Very rarely has acid indigestion.  Eats the same things over and over--doesn't like cooking anymore.    Walks two to three times a day.  No other exercises.    Past Medical History:  Diagnosis Date  . Alopecia   . Breast cancer ([place])     Right , chemo, radiation  . DDD (degenerative disc disease)   . Heart palpitations   . History of diverticulitis   . History of diverticulitis of colon   . Hypercholesteremia   . Hypothyroidism   . Insect bites   . Internal and external bleeding  hemorrhoids 08/11/2015  . Personal history of chemotherapy 2012  . Personal history of radiation therapy 2012  . Scoliosis    L4 L5  . Seasonal allergies   . SUI (stress urinary incontinence, female)     Past Surgical History:  Procedure Laterality Date  . BLADDER SUSPENSION    . bowel rescetion    . BOWEL RESECTION    . BREAST BIOPSY Right 03/14/2010  . BREAST LUMPECTOMY  2012   right  . CATARACT EXTRACTION     left  . COLON SURGERY  20 YRS AGO   BOWEL RESECTION  . EYE SURGERY    . HEMORRHOID BANDING    . HERNIA REPAIR     RIH, Dr. Rise Patience  . JOINT REPLACEMENT    . PORT-A-CATH REMOVAL    . TONSILLECTOMY  2001   Diverticulitis  . TOTAL HIP ARTHROPLASTY Right 09/10/2014   Procedure: RIGHT TOTAL HIP ARTHROPLASTY ANTERIOR APPROACH;  Surgeon: Mcarthur Rossetti, MD;  Location: [facility];  Service: Orthopedics;  Laterality: Right;    Allergies  Allergen Reactions  . Amoxicillin Rash  . Diphenhydramine Rash    "It is the red dye in Benadryl that I''m allergic to"  . Naproxen Sodium Rash    "It  is the blue dye in naproxen that I'm allergic to."  . Lipitor [Atorvastatin]   . Meclizine Other (See Comments)    Worsening dizziness and blurred vision  . Amlodipine Other (See Comments)    Dizzy  . Cefdinir Other (See Comments)    Vertigo and headaches   . Codeine Other (See Comments)    unknown  . Ferrous Sulfate     "tears belly up"  . Methocarbamol Other (See Comments)    insomnia   . Prednisone Other (See Comments)    vertigo and headaches  . Skelaxin [Metaxalone] Other (See Comments)    Gastric upset.   . Tramadol Nausea Only  . Hctz [Hydrochlorothiazide] Itching    Outpatient Encounter Medications as of 09/19/2017  Medication Sig  . fish oil-omega-3 fatty acids 1000 MG capsule Take 2 g by mouth 2 (two) times daily.   Marland Kitchen lidocaine (XYLOCAINE) 5 % ointment Apply 1 application topically 3 (three) times daily as needed. For skin pain  . LORazepam (ATIVAN) 0.5 MG  tablet Take one tablet daily as needed  . omeprazole (PRILOSEC) 40 MG capsule TAKE 1 CAPSULE DAILY  . SYNTHROID 100 MCG tablet TAKE 1 TABLET DAILY BEFORE BREAKFAST  . tamoxifen (NOLVADEX) 20 MG tablet Take 1 tablet (20 mg total) by mouth daily.  . naproxen sodium (ANAPROX) 220 MG tablet Take 220 mg by mouth 2 (two) times daily.  . [DISCONTINUED] aspirin EC 81 MG tablet Take 81 mg by mouth.  . [DISCONTINUED] loteprednol (LOTEMAX) 0.5 % ophthalmic suspension Place 1 drop into both eyes as needed.  . [DISCONTINUED] mometasone (ELOCON) 0.1 % cream Apply 1 application topically daily as needed. For severe itching  . [DISCONTINUED] neomycin-bacitracin-polymyxin (NEOSPORIN) ointment Apply 1 application topically daily as needed for wound care. Reported on 03/12/2015   No facility-administered encounter medications on file as of 09/19/2017.     Review of Systems:  Review of Systems  Constitutional: Negative for chills, fever and malaise/fatigue.  HENT: Negative for congestion and hearing loss.   Eyes: Negative for blurred vision.  Respiratory: Negative for shortness of breath.   Cardiovascular: Negative for chest pain, palpitations and leg swelling.  Gastrointestinal: Positive for heartburn. Negative for abdominal pain.  Genitourinary: Negative for dysuria.  Musculoskeletal: Negative for falls.  Skin: Negative for itching and rash.  Neurological: Negative for dizziness and loss of consciousness.  Psychiatric/Behavioral: Negative for depression and memory loss. The patient is nervous/anxious.     Health Maintenance  Topic Date Due  . INFLUENZA VACCINE  09/26/2017  . TETANUS/TDAP  05/02/2022  . DEXA SCAN  Completed  . PNA vac Low Risk Adult  Completed    Physical Exam: Vitals:   09/19/17 1036  BP: (!) 160/80  Pulse: 78  Resp: 20  Temp: 98.1 F (36.7 C)  SpO2: 94%  Weight: 130 lb 3.2 oz (59.1 kg)  Height: 5\' 1"  (1.549 m)   Body mass index is 24.6 kg/m. Physical Exam    Constitutional: She is oriented to person, place, and time. She appears well-developed and well-nourished. No distress.  Cardiovascular: Normal rate, regular rhythm, normal heart sounds and intact distal pulses.  Pulmonary/Chest: Effort normal and breath sounds normal. No respiratory distress.  Abdominal: Bowel sounds are normal.  Musculoskeletal: Normal range of motion.  Neurological: She is alert and oriented to person, place, and time.  Skin: Skin is warm and dry. Capillary refill takes less than 2 seconds.  Psychiatric: She has a normal mood and affect.    Labs  reviewed: Basic Metabolic Panel: Recent Labs    03/27/17 0914 06/12/17 1305  NA 141 139  K 4.1 4.0  CL 102 105  CO2 32 28  GLUCOSE 88 102  BUN 22 21  CREATININE 0.84 0.84  CALCIUM 9.9 9.2  TSH 1.80  --    Liver Function Tests: Recent Labs    06/12/17 1305  AST 22  ALT 16  ALKPHOS 36*  BILITOT 0.4  PROT 6.2*  ALBUMIN 3.8   No results for input(s): LIPASE, AMYLASE in the last 8760 hours. No results for input(s): AMMONIA in the last 8760 hours. CBC: Recent Labs    03/27/17 0914 06/12/17 1305  WBC 6.8 6.0  NEUTROABS 2,815 3.4  HGB 13.7 13.1  HCT 41.0 39.7  MCV 92.8 95.2  PLT 183 142*    Assessment/Plan 1. Postmenopausal - agrees to a bone density test, but says she won't take any of the medications to treat osteoporosis if she has it, remains on tamoxifen due to breast cancer - DG Bone Density; Future  2. Estrogen deficiency - DG Bone Density; Future  3. Hypothyroidism due to acquired atrophy of thyroid - continues on synthroid 155mcg each morning, stable, no concerns - TSH; Future  4. Iron deficiency anemia due to chronic blood loss -no longer anemic even with nsaid use - CBC with Differential/Platelet; Future - COMPLETE METABOLIC PANEL WITH GFR; Future  5. Malignant neoplasm of upper-outer quadrant of right breast in female, estrogen receptor positive ([place]) - has opted to continue  tamoxifen at this [name] - DG Bone Density; Future - COMPLETE METABOLIC PANEL WITH GFR; Future  6. Encounter for monitoring tamoxifen therapy - DG Bone Density; Future  Labs/tests ordered:   Orders Placed This Encounter  Procedures  . DG Bone Density    Standing Status:   Future    Standing Expiration Date:   11/21/2018    Order Specific Question:   Reason for Exam (SYMPTOM  OR DIAGNOSIS REQUIRED)    Answer:   h/o breast cancer and tamoxifen therapy, h/o chemo    Order Specific Question:   Preferred imaging location?    Answer:   [facility]  . CBC with Differential/Platelet    Standing Status:   Future    Standing Expiration Date:   09/20/2018  . COMPLETE METABOLIC PANEL WITH GFR    Standing Status:   Future    Standing Expiration Date:   09/20/2018  . TSH    Standing Status:   Future    Standing Expiration Date:   09/20/2018   Next appt:  6 mos med mgt, labs before  Alice [name], D.O. [facility] [address], [place] [address] Cell Phone (Mon-Fri 8am-5pm):  [phone] On Call:  [phone] & follow prompts after 5pm & weekends Office Phone:  [phone] Office Fax:  [phone]

## 2017-09-19 NOTE — Patient Instructions (Signed)
Carriage [facility] retirement communities:  [facility], [place], [place], [place].

## 2017-09-21 ENCOUNTER — Other Ambulatory Visit: Payer: Self-pay | Admitting: Internal Medicine

## 2017-09-28 ENCOUNTER — Other Ambulatory Visit: Payer: Self-pay | Admitting: Adult Health

## 2017-10-14 ENCOUNTER — Ambulatory Visit (INDEPENDENT_AMBULATORY_CARE_PROVIDER_SITE_OTHER): Payer: Medicare Other

## 2017-10-14 VITALS — BP 120/78 | HR 68 | Temp 98.1°F | Ht 61.0 in | Wt 131.0 lb

## 2017-10-14 DIAGNOSIS — Z0000 Encounter for general adult medical examination without abnormal findings: Secondary | ICD-10-CM | POA: Diagnosis not present

## 2017-10-14 MED ORDER — ZOSTER VAC RECOMB ADJUVANTED 50 MCG/0.5ML IM SUSR: 0.5000 mL | Freq: Once | INTRAMUSCULAR | 1 refills | Status: AC

## 2017-10-14 MED ORDER — LORAZEPAM 0.5 MG PO TABS: ORAL_TABLET | ORAL | 0 refills | Status: DC

## 2017-10-14 NOTE — Patient Instructions (Signed)
[name] , Thank you for taking time to come for your Medicare Wellness Visit. I appreciate your ongoing commitment to your health goals. Please review the following plan we discussed and let me know if I can assist you in the future.   Screening recommendations/referrals: Colonoscopy excluded, over age 82 Mammogram excluded, over age 31 Bone Density up to date Recommended yearly ophthalmology/optometry visit for glaucoma screening and checkup Recommended yearly dental visit for hygiene and checkup  Vaccinations: Influenza vaccine due Pneumococcal vaccine up to date, completed Tdap vaccine up to date, due 05/02/2022 Shingles vaccine due, ordered to pharmacy    Advanced directives: in chart  Conditions/risks identified: none  Next appointment: Dr. Mariea Clonts 03/27/2018 @ 10:30am            Tyson Dense, RN 10/20/2018 @ 10:30am   Preventive Care 65 Years and Older, Female Preventive care refers to lifestyle choices and visits with your health care provider that can promote health and wellness. What does preventive care include?  A yearly physical exam. This is also called an annual well check.  Dental exams once or twice a year.  Routine eye exams. Ask your health care provider how often you should have your eyes checked.  Personal lifestyle choices, including:  Daily care of your teeth and gums.  Regular physical activity.  Eating a healthy diet.  Avoiding tobacco and drug use.  Limiting alcohol use.  Practicing safe sex.  Taking low-dose aspirin every day.  Taking vitamin and mineral supplements as recommended by your health care provider. What happens during an annual well check? The services and screenings done by your health care provider during your annual well check will depend on your age, overall health, lifestyle risk factors, and family history of disease. Counseling  Your health care provider may ask you questions about your:  Alcohol use.  Tobacco  use.  Drug use.  Emotional well-being.  Home and relationship well-being.  Sexual activity.  Eating habits.  History of falls.  Memory and ability to understand (cognition).  Work and work Statistician.  Reproductive health. Screening  You may have the following tests or measurements:  Height, weight, and BMI.  Blood pressure.  Lipid and cholesterol levels. These may be checked every 5 years, or more frequently if you are over 77 years old.  Skin check.  Lung cancer screening. You may have this screening every year starting at age 80 if you have a 30-pack-year history of smoking and currently smoke or have quit within the past 15 years.  Fecal occult blood test (FOBT) of the stool. You may have this test every year starting at age 61.  Flexible sigmoidoscopy or colonoscopy. You may have a sigmoidoscopy every 5 years or a colonoscopy every 10 years starting at age 37.  Hepatitis C blood test.  Hepatitis B blood test.  Sexually transmitted disease (STD) testing.  Diabetes screening. This is done by checking your blood sugar (glucose) after you have not eaten for a while (fasting). You may have this done every 1-3 years.  Bone density scan. This is done to screen for osteoporosis. You may have this done starting at age 31.  Mammogram. This may be done every 1-2 years. Talk to your health care provider about how often you should have regular mammograms. Talk with your health care provider about your test results, treatment options, and if necessary, the need for more tests. Vaccines  Your health care provider may recommend certain vaccines, such as:  Influenza vaccine.  This is recommended every year.  Tetanus, diphtheria, and acellular pertussis (Tdap, Td) vaccine. You may need a Td booster every 10 years.  Zoster vaccine. You may need this after age 5.  Pneumococcal 13-valent conjugate (PCV13) vaccine. One dose is recommended after age 96.  Pneumococcal  polysaccharide (PPSV23) vaccine. One dose is recommended after age 53. Talk to your health care provider about which screenings and vaccines you need and how often you need them. This information is not intended to replace advice given to you by your health care provider. Make sure you discuss any questions you have with your health care provider. Document Released: 03/11/2015 Document Revised: 11/02/2015 Document Reviewed: 12/14/2014 Elsevier Interactive Patient Education  2017 [place] Prevention in the Home Falls can cause injuries. They can happen to people of all ages. There are many things you can do to make your home safe and to help prevent falls. What can I do on the outside of my home?  Regularly fix the edges of walkways and driveways and fix any cracks.  Remove anything that might make you trip as you walk through a door, such as a raised step or threshold.  Trim any bushes or trees on the path to your home.  Use bright outdoor lighting.  Clear any walking paths of anything that might make someone trip, such as rocks or tools.  Regularly check to see if handrails are loose or broken. Make sure that both sides of any steps have handrails.  Any raised decks and porches should have guardrails on the edges.  Have any leaves, snow, or ice cleared regularly.  Use sand or salt on walking paths during winter.  Clean up any spills in your garage right away. This includes oil or grease spills. What can I do in the bathroom?  Use night lights.  Install grab bars by the toilet and in the tub and shower. Do not use towel bars as grab bars.  Use non-skid mats or decals in the tub or shower.  If you need to sit down in the shower, use a plastic, non-slip stool.  Keep the floor dry. Clean up any water that spills on the floor as soon as it happens.  Remove soap buildup in the tub or shower regularly.  Attach bath mats securely with double-sided non-slip rug  tape.  Do not have throw rugs and other things on the floor that can make you trip. What can I do in the bedroom?  Use night lights.  Make sure that you have a light by your bed that is easy to reach.  Do not use any sheets or blankets that are too big for your bed. They should not hang down onto the floor.  Have a firm chair that has side arms. You can use this for support while you get dressed.  Do not have throw rugs and other things on the floor that can make you trip. What can I do in the kitchen?  Clean up any spills right away.  Avoid walking on wet floors.  Keep items that you use a lot in easy-to-reach places.  If you need to reach something above you, use a strong step stool that has a grab bar.  Keep electrical cords out of the way.  Do not use floor polish or wax that makes floors slippery. If you must use wax, use non-skid floor wax.  Do not have throw rugs and other things on the floor that can make  you trip. What can I do with my stairs?  Do not leave any items on the stairs.  Make sure that there are handrails on both sides of the stairs and use them. Fix handrails that are broken or loose. Make sure that handrails are as long as the stairways.  Check any carpeting to make sure that it is firmly attached to the stairs. Fix any carpet that is loose or worn.  Avoid having throw rugs at the top or bottom of the stairs. If you do have throw rugs, attach them to the floor with carpet tape.  Make sure that you have a light switch at the top of the stairs and the bottom of the stairs. If you do not have them, ask someone to add them for you. What else can I do to help prevent falls?  Wear shoes that:  Do not have high heels.  Have rubber bottoms.  Are comfortable and fit you well.  Are closed at the toe. Do not wear sandals.  If you use a stepladder:  Make sure that it is fully opened. Do not climb a closed stepladder.  Make sure that both sides of the  stepladder are locked into place.  Ask someone to hold it for you, if possible.  Clearly mark and make sure that you can see:  Any grab bars or handrails.  First and last steps.  Where the edge of each step is.  Use tools that help you move around (mobility aids) if they are needed. These include:  Canes.  Walkers.  Scooters.  Crutches.  Turn on the lights when you go into a dark area. Replace any light bulbs as soon as they burn out.  Set up your furniture so you have a clear path. Avoid moving your furniture around.  If any of your floors are uneven, fix them.  If there are any pets around you, be aware of where they are.  Review your medicines with your doctor. Some medicines can make you feel dizzy. This can increase your chance of falling. Ask your doctor what other things that you can do to help prevent falls. This information is not intended to replace advice given to you by your health care provider. Make sure you discuss any questions you have with your health care provider. Document Released: 12/09/2008 Document Revised: 07/21/2015 Document Reviewed: 03/19/2014 Elsevier Interactive Patient Education  2017 [employer].

## 2017-10-14 NOTE — Addendum Note (Signed)
Addended by: Despina Hidden on: 10/14/2017 11:23 AM   Modules accepted: Orders

## 2017-10-14 NOTE — Progress Notes (Signed)
Subjective:   Alice [name] is a 84 y.o. female who presents for Medicare Annual (Subsequent) preventive examination.  Last AWV-08/24/2016    Objective:     Vitals: BP 120/78 (BP Location: Left Arm, Patient Position: Sitting)   Pulse 68   Temp 98.1 F (36.7 C) (Oral)   Ht 5\' 1"  (1.549 m)   Wt 131 lb (59.4 kg)   SpO2 94%   BMI 24.75 kg/m   Body mass index is 24.75 kg/m.  Advanced Directives 10/14/2017 09/23/2016 08/24/2016 02/09/2016 02/09/2016 02/09/2016 08/11/2015  Does Patient Have a Medical Advance Directive? Yes Yes Yes Yes Yes Yes Yes  Type of Paramedic of [place];Living will;Out of facility DNR (pink MOST or yellow form) [place];Living will [place];Living will Out of facility DNR (pink MOST or yellow form) [place];Living will Healthcare Power of [place];Living will  Does patient want to make changes to medical advance directive? No - Patient declined No - Patient declined No - Patient declined - No - Patient declined - -  Copy of [place] in Chart? Yes No - copy requested No - copy requested Yes Yes No - copy requested Yes  Pre-existing out of facility DNR order (yellow form or pink MOST form) Yellow form placed in chart (order not valid for inpatient use) - - - - - -    Tobacco Social History   Tobacco Use  Smoking Status Former Smoker  . Last attempt to quit: 08/09/1964  . Years since quitting: 53.2  Smokeless Tobacco Never Used     Counseling given: Not Answered   Clinical Intake:  Pre-visit preparation completed: No  Pain : No/denies pain     Nutritional Risks: None Diabetes: No  How often do you need to have someone help you when you read instructions, pamphlets, or other written materials from your doctor or pharmacy?: 1 - Never What is the last grade level you completed in school?: Bachelors  Interpreter Needed?:  No  Information entered by :: Tyson Dense, RN  Past Medical History:  Diagnosis Date  . Alopecia   . Breast cancer ([place])     Right , chemo, radiation  . DDD (degenerative disc disease)   . Heart palpitations   . History of diverticulitis   . History of diverticulitis of colon   . Hypercholesteremia   . Hypothyroidism   . Insect bites   . Internal and external bleeding hemorrhoids 08/11/2015  . Personal history of chemotherapy 2012  . Personal history of radiation therapy 2012  . Scoliosis    L4 L5  . Seasonal allergies   . SUI (stress urinary incontinence, female)    Past Surgical History:  Procedure Laterality Date  . BLADDER SUSPENSION    . bowel rescetion    . BOWEL RESECTION    . BREAST BIOPSY Right 03/14/2010  . BREAST LUMPECTOMY  2012   right  . CATARACT EXTRACTION     left  . COLON SURGERY  20 YRS AGO   BOWEL RESECTION  . EYE SURGERY    . HEMORRHOID BANDING    . HERNIA REPAIR     [facility], Dr. Rise Patience  . JOINT REPLACEMENT    . PORT-A-CATH REMOVAL    . TONSILLECTOMY  2001   Diverticulitis  . TOTAL HIP ARTHROPLASTY Right 09/10/2014   Procedure: RIGHT TOTAL HIP ARTHROPLASTY ANTERIOR APPROACH;  Surgeon: Mcarthur Rossetti, MD;  Location: [facility];  Service: Orthopedics;  Laterality: Right;   Family History  Problem Relation Age of Onset  . Heart disease Mother   . Heart disease Father   . CVA Father 60  . Cancer Sister 70       lymphoma  . Asthma Son   . Asthma Son   . Asthma Son   . Skin cancer Brother 34       melanoma  . Colon cancer Unknown    Social History   Socioeconomic History  . Marital status: Divorced    Spouse name: Not on file  . Number of children: 3  . Years of education: Not on file  . Highest education level: Not on file  Occupational History  . Occupation: Retired    Fish farm manager: RETIRED  Social Needs  . Financial resource strain: Not hard at all  . Food insecurity:    Worry: Never true    Inability: Never true  .  Transportation needs:    Medical: No    Non-medical: No  Tobacco Use  . Smoking status: Former Smoker    Last attempt to quit: 08/09/1964    Years since quitting: 53.2  . Smokeless tobacco: Never Used  Substance and Sexual Activity  . Alcohol use: Yes    Comment: RARE  . Drug use: No  . Sexual activity: Not Currently  Lifestyle  . Physical activity:    Days per week: 7 days    Minutes per session: 90 min  . Stress: To some extent  Relationships  . Social connections:    Talks on phone: More than three times a week    Gets together: Three times a week    Attends religious service: Never    Active member of club or organization: Yes    Attends meetings of clubs or organizations: More than 4 times per year    Relationship status: Divorced  Other Topics Concern  . Not on file  Social History Narrative   Tobacco use, amount per day now: N/A      Past tobacco use, amount per day: 1 pack, Quit 1969      How many years did you use tobacco: N/A      Alcohol use (drinks per week): Occassionally. Glass of wine- Holidays      Diet: N/A      Do you drink/eat things with caffeine? Coffee and Tea      Marital status: Divorced             What year were you married?      Do you live in a house, apartment, assisted living, condo, trailer? Condo      Is it one or more stories? One      How many persons live in your home? Self and Service Dog      Do you have any pets in your home? 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 Walking            How often? Usually daily 2-4 times      Do you have a living will? Yes      Do you have a DNR form? N/A            If not, do you want to discuss one? N/A      Do you have signed POA/HPOA forms? No  Outpatient Encounter Medications as of 10/14/2017  Medication Sig  . fish oil-omega-3 fatty acids 1000 MG capsule Take 2 g by mouth 2 (two) times daily.   Marland Kitchen  lidocaine (XYLOCAINE) 5 % ointment Apply 1 application topically 3 (three) times daily as needed. For skin pain  . LORazepam (ATIVAN) 0.5 MG tablet Take one tablet daily as needed  . naproxen sodium (ANAPROX) 220 MG tablet Take 220 mg by mouth daily.   Marland Kitchen omeprazole (PRILOSEC) 40 MG capsule TAKE 1 CAPSULE DAILY (Patient taking differently: as needed. )  . SYNTHROID 100 MCG tablet TAKE 1 TABLET DAILY BEFORE BREAKFAST  . tamoxifen (NOLVADEX) 20 MG tablet Take 1 tablet (20 mg total) by mouth daily.  Marland Kitchen Zoster Vaccine Adjuvanted [facility]) injection Inject 0.5 mLs into the muscle once for 1 dose.  . [DISCONTINUED] Zoster Vaccine Adjuvanted [facility]) injection Inject 0.5 mLs into the muscle once.   No facility-administered encounter medications on file as of 10/14/2017.     Activities of Daily Living In your present state of health, do you have any difficulty performing the following activities: 10/14/2017  Hearing? N  Vision? N  Difficulty concentrating or making decisions? N  Walking or climbing stairs? N  Dressing or bathing? N  Doing errands, shopping? N  Preparing Food and eating ? N  Using the Toilet? N  In the past six months, have you accidently leaked urine? Y  Comment wears pads  Do you have problems with loss of bowel control? N  Managing your Medications? N  Managing your Finances? N  Housekeeping or managing your Housekeeping? N  Some recent data might be hidden    Patient Care Team: [name], DO as PCP - General (Geriatric Medicine) Mcarthur Rossetti, MD as Consulting Physician (Orthopedic Surgery) Magrinat, Virgie Dad, MD as Consulting Physician (Oncology)    Assessment:   This is a routine wellness examination for Alice [name].  Exercise Activities and Dietary recommendations Current Exercise Habits: Home exercise routine, Type of exercise: walking, Time (Minutes): > 60, Frequency (Times/Week): 7, Weekly Exercise (Minutes/Week): 0, Exercise limited by: None  identified  Goals    . Increase water intake     Pt will try to increase water intake.        Fall Risk Fall Risk  10/14/2017 03/18/2017 08/24/2016 08/20/2015 08/11/2015  Falls in the past year? Yes No No No No  Number falls in past yr: 1 - - - -  Injury with Fall? No - - - -   Is the patient's home free of loose throw rugs in walkways, pet beds, electrical cords, etc?   yes      Grab bars in the bathroom? no      Handrails on the stairs?   yes      Adequate lighting?   yes  Timed Get Up and Go performed: 12 seconds  Depression Screen PHQ 2/9 Scores 10/14/2017 08/24/2016 08/20/2015 08/11/2015  PHQ - 2 Score 0 1 0 0     Cognitive Function MMSE - Mini Mental State Exam 10/14/2017 08/24/2016 07/20/2015  Orientation to time 4 5 5   Orientation to Place 5 4 5   Registration 3 3 3   Attention/ Calculation 5 5 5   Recall 3 3 3   Language- name 2 objects 2 2 2   Language- repeat 1 1 1   Language- follow 3 step command 3 2 3   Language- read & follow direction 1 1 1   Write a sentence 1 1 1   Copy design 1 1  1  Total score 29 28 30         Immunization History  Administered Date(s) Administered  . Influenza, High Dose Seasonal PF 03/18/2017  . Zoster 02/27/1999    Qualifies for Shingles Vaccine? Yes, educated and ordered to pharmacy  Screening Tests Health Maintenance  Topic Date Due  . INFLUENZA VACCINE  09/26/2017  . TETANUS/TDAP  05/02/2022  . DEXA SCAN  Completed  . PNA vac Low Risk Adult  Completed    Cancer Screenings: Lung: Low Dose CT Chest recommended if Age 8-80 years, 30 pack-year currently smoking OR have quit w/in 15years. Patient does not qualify. Breast:  Up to date on Mammogram? Yes   Up to date of Bone Density/Dexa? Yes Colorectal: up to date  Additional Screenings:  Hepatitis C Screening: declined Flu vaccine due: patient wants to wait until we get the high dose     Plan:    I have personally reviewed and addressed the Medicare Annual Wellness  questionnaire and have noted the following in the patient's chart:  A. Medical and social history B. Use of alcohol, tobacco or illicit drugs  C. Current medications and supplements D. Functional ability and status E.  Nutritional status F.  Physical activity G. Advance directives H. List of other physicians I.  Hospitalizations, surgeries, and ER visits in previous 12 months J.  [place] to include hearing, vision, cognitive, depression L. Referrals and appointments - none  In addition, I have reviewed and discussed with patient certain preventive protocols, quality metrics, and best practice recommendations. A written personalized care plan for preventive services as well as general preventive health recommendations were provided to patient.  See attached scanned questionnaire for additional information.   Signed,   Tyson Dense, RN Nurse Health Advisor  Patient Concerns: Had a hot flash 2 days ago that was pretty strong. Dull pain L lower side of back when she wakes up.

## 2017-11-11 ENCOUNTER — Other Ambulatory Visit: Payer: Self-pay | Admitting: Internal Medicine

## 2017-11-20 ENCOUNTER — Other Ambulatory Visit: Payer: Self-pay | Admitting: *Deleted

## 2017-11-20 MED ORDER — LORAZEPAM 0.5 MG PO TABS: ORAL_TABLET | ORAL | 0 refills | Status: DC

## 2017-11-20 NOTE — Telephone Encounter (Signed)
Patient requested refill. Phoned to pharmacy.  

## 2017-12-18 ENCOUNTER — Other Ambulatory Visit: Payer: Self-pay | Admitting: *Deleted

## 2017-12-18 MED ORDER — LORAZEPAM 0.5 MG PO TABS: ORAL_TABLET | ORAL | 0 refills | Status: DC

## 2017-12-18 NOTE — Telephone Encounter (Signed)
Patient requested refill Phoned to pharmacy.  Patient stated that she is going out of town tomorrow for a week.

## 2018-01-20 ENCOUNTER — Other Ambulatory Visit: Payer: Self-pay | Admitting: Internal Medicine

## 2018-01-20 MED ORDER — LORAZEPAM 0.5 MG PO TABS: ORAL_TABLET | ORAL | 0 refills | Status: DC

## 2018-01-20 NOTE — Telephone Encounter (Signed)
A medication refill was received from pharmacy for lorazepam 0.5 mg. Rx was called in to pharmacy after verifying last fill date, provider, and quantity on PMP [place].

## 2018-02-07 ENCOUNTER — Telehealth: Payer: Self-pay | Admitting: *Deleted

## 2018-02-07 DIAGNOSIS — N3000 Acute cystitis without hematuria: Secondary | ICD-10-CM

## 2018-02-07 MED ORDER — CIPROFLOXACIN HCL 500 MG PO TABS: 500.0000 mg | ORAL_TABLET | Freq: Two times a day (BID) | ORAL | 0 refills | Status: AC

## 2018-02-07 NOTE — Telephone Encounter (Signed)
Patient called c/o possible UrinaryTract Infection (UTI)  1. What symptoms are you having (frequency, urgency, dysuria, incontinence, confusion)? Burning and stinging when urinates  2. Any fever or chills? No  3. Any suprapubic pain? No, just burns and stings when urinates.   4. Have you taken anything OTC for symptoms? No. Stated she Does NOT want to go out of house due to sick animal. Stated that she has had these for 50 years and knows what is going on.   5. How much water are you drinking daily? Has increased water intake.   6. How long have you had your symptoms (onset)? Since Yesterday.   I will forward this information to your provider and call with instructions, if your symptoms persist or progress seek medical attention at your nearest urgent care or emergency room. Patient verbalized understanding.

## 2018-02-07 NOTE — Telephone Encounter (Signed)
Of course, this just happened at noontime when we cannot have her come in to provide the urine sample.  She should be drinking 8 full glasses of water per day.  I suppose we can call in an antibiotic this time, but she will have to go out to pick it up.  I typically avoid doing that b/c we will not know if the bacteria in her urine are susceptible to the antibiotic I give her.  I have sent cipro to [facility].  I hope she feels better and that Max does, too.

## 2018-02-10 ENCOUNTER — Other Ambulatory Visit: Payer: Self-pay

## 2018-02-10 MED ORDER — SYNTHROID 100 MCG PO TABS: 100.0000 ug | ORAL_TABLET | Freq: Every day | ORAL | 0 refills | Status: DC

## 2018-03-03 ENCOUNTER — Other Ambulatory Visit: Payer: Self-pay | Admitting: *Deleted

## 2018-03-03 MED ORDER — LORAZEPAM 0.5 MG PO TABS: ORAL_TABLET | ORAL | 0 refills | Status: DC

## 2018-03-03 NOTE — Telephone Encounter (Signed)
Patient requested refill [place] Verified LR: 01/24/2018 Phoned Rx to pharmacy.

## 2018-03-19 ENCOUNTER — Encounter: Payer: Self-pay | Admitting: Internal Medicine

## 2018-03-20 ENCOUNTER — Other Ambulatory Visit: Payer: Self-pay | Admitting: *Deleted

## 2018-03-20 MED ORDER — SYNTHROID 100 MCG PO TABS: 100.0000 ug | ORAL_TABLET | Freq: Every day | ORAL | 1 refills | Status: DC

## 2018-03-24 ENCOUNTER — Other Ambulatory Visit: Payer: Medicare Other

## 2018-03-24 ENCOUNTER — Other Ambulatory Visit: Payer: Self-pay | Admitting: *Deleted

## 2018-03-24 DIAGNOSIS — D500 Iron deficiency anemia secondary to blood loss (chronic): Secondary | ICD-10-CM | POA: Diagnosis not present

## 2018-03-24 DIAGNOSIS — C50411 Malignant neoplasm of upper-outer quadrant of right female breast: Secondary | ICD-10-CM

## 2018-03-24 DIAGNOSIS — Z170 Estrogen receptor positive status [ER+]: Secondary | ICD-10-CM

## 2018-03-24 DIAGNOSIS — E034 Atrophy of thyroid (acquired): Secondary | ICD-10-CM

## 2018-03-24 MED ORDER — SYNTHROID 100 MCG PO TABS: 100.0000 ug | ORAL_TABLET | Freq: Every day | ORAL | 1 refills | Status: DC

## 2018-03-24 MED ORDER — LORAZEPAM 0.5 MG PO TABS: ORAL_TABLET | ORAL | 0 refills | Status: DC

## 2018-03-24 NOTE — Telephone Encounter (Signed)
Patient is requesting a 90 day supply to be sent to [facility].   Pended Rx and sent to Dr. Mariea Clonts for approval.

## 2018-03-25 LAB — COMPLETE METABOLIC PANEL WITH GFR
AG Ratio: 2 (calc) (ref 1.0–2.5)
ALT: 11 U/L (ref 6–29)
AST: 17 U/L (ref 10–35)
Albumin: 4.3 g/dL (ref 3.6–5.1)
Alkaline phosphatase (APISO): 32 U/L — ABNORMAL LOW (ref 33–130)
BUN: 18 mg/dL (ref 7–25)
CO2: 32 mmol/L (ref 20–32)
Calcium: 9.6 mg/dL (ref 8.6–10.4)
Chloride: 103 mmol/L (ref 98–110)
Creat: 0.79 mg/dL (ref 0.60–0.88)
GFR, Est African American: 79 mL/min/{1.73_m2} (ref >=60)
GFR, Est Non African American: 68 mL/min/{1.73_m2} (ref >=60)
Globulin: 2.2 g/dL (calc) (ref 1.9–3.7)
Glucose, Bld: 92 mg/dL (ref 65–99)
Potassium: 4.5 mmol/L (ref 3.5–5.3)
Sodium: 143 mmol/L (ref 135–146)
Total Bilirubin: 0.5 mg/dL (ref 0.2–1.2)
Total Protein: 6.5 g/dL (ref 6.1–8.1)

## 2018-03-25 LAB — CBC WITH DIFFERENTIAL/PLATELET
Absolute Monocytes: 554 cells/uL (ref 200–950)
Basophils Absolute: 50 cells/uL (ref 0–200)
Basophils Relative: 0.7 %
Eosinophils Absolute: 298 cells/uL (ref 15–500)
Eosinophils Relative: 4.2 %
HCT: 41.2 % (ref 35.0–45.0)
Hemoglobin: 13.8 g/dL (ref 11.7–15.5)
Lymphs Abs: 3373 cells/uL (ref 850–3900)
MCH: 30.9 pg (ref 27.0–33.0)
MCHC: 33.5 g/dL (ref 32.0–36.0)
MCV: 92.2 fL (ref 80.0–100.0)
MPV: 10.7 fL (ref 7.5–12.5)
Monocytes Relative: 7.8 %
Neutro Abs: 2826 cells/uL (ref 1500–7800)
Neutrophils Relative %: 39.8 %
Platelets: 206 10*3/uL (ref 140–400)
RBC: 4.47 10*6/uL (ref 3.80–5.10)
RDW: 12.6 % (ref 11.0–15.0)
Total Lymphocyte: 47.5 %
WBC: 7.1 10*3/uL (ref 3.8–10.8)

## 2018-03-25 LAB — TSH: TSH: 1.18 mIU/L (ref 0.40–4.50)

## 2018-03-27 ENCOUNTER — Encounter: Payer: Self-pay | Admitting: Internal Medicine

## 2018-03-27 ENCOUNTER — Encounter: Payer: Medicare Other | Admitting: Internal Medicine

## 2018-03-27 NOTE — Progress Notes (Signed)
Location:  [facility] clinic Provider:  Basia Mcginty L. Mariea Clonts, D.O., C.M.D.  Code Status:  Goals of Care:  Advanced Directives 10/14/2017  Does Patient Have a Medical Advance Directive? Yes  Type of Paramedic of [place];Living will;Out of facility DNR (pink MOST or yellow form)  Does patient want to make changes to medical advance directive? No - Patient declined  Copy of [place] in Chart? Yes  Pre-existing out of facility DNR order (yellow form or pink MOST form) Yellow form placed in chart (order not valid for inpatient use)     Chief Complaint  Patient presents with  . Medical Management of Chronic Issues    29mth follow-up    HPI: Patient is a 85 y.o. female seen today for medical management of chronic diseases.     Past Medical History:  Diagnosis Date  . Alopecia   . Breast cancer ([place])     Right , chemo, radiation  . DDD (degenerative disc disease)   . Heart palpitations   . History of diverticulitis   . History of diverticulitis of colon   . Hypercholesteremia   . Hypothyroidism   . Insect bites   . Internal and external bleeding hemorrhoids 08/11/2015  . Personal history of chemotherapy 2012  . Personal history of radiation therapy 2012  . Scoliosis    L4 L5  . Seasonal allergies   . SUI (stress urinary incontinence, female)     Past Surgical History:  Procedure Laterality Date  . BLADDER SUSPENSION    . bowel rescetion    . BOWEL RESECTION    . BREAST BIOPSY Right 03/14/2010  . BREAST LUMPECTOMY  2012   right  . CATARACT EXTRACTION     left  . COLON SURGERY  20 YRS AGO   BOWEL RESECTION  . EYE SURGERY    . HEMORRHOID BANDING    . HERNIA REPAIR     [facility], Dr. Rise Patience  . JOINT REPLACEMENT    . PORT-A-CATH REMOVAL    . TONSILLECTOMY  2001   Diverticulitis  . TOTAL HIP ARTHROPLASTY Right 09/10/2014   Procedure: RIGHT TOTAL HIP ARTHROPLASTY ANTERIOR APPROACH;  Surgeon: Mcarthur Rossetti, MD;  Location: [facility]  ORS;  Service: Orthopedics;  Laterality: Right;    Allergies  Allergen Reactions  . Amoxicillin Rash  . Diphenhydramine Rash    "It is the red dye in Benadryl that I''m allergic to"  . Naproxen Sodium Rash    "It is the blue dye in naproxen that I'm allergic to."  . Lipitor [Atorvastatin]   . Meclizine Other (See Comments)    Worsening dizziness and blurred vision  . Amlodipine Other (See Comments)    Dizzy  . Cefdinir Other (See Comments)    Vertigo and headaches   . Codeine Other (See Comments)    unknown  . Ferrous Sulfate     "tears belly up"  . Methocarbamol Other (See Comments)    insomnia   . Prednisone Other (See Comments)    vertigo and headaches  . Skelaxin [Metaxalone] Other (See Comments)    Gastric upset.   . Tramadol Nausea Only  . Hctz [Hydrochlorothiazide] Itching    Outpatient Encounter Medications as of 03/27/2018  Medication Sig  . fish oil-omega-3 fatty acids 1000 MG capsule Take 2 g by mouth 2 (two) times daily.   Marland Kitchen lidocaine (XYLOCAINE) 5 % ointment Apply 1 application topically 3 (three) times daily as needed. For skin pain  . LORazepam (  ATIVAN) 0.5 MG tablet Take one tablet daily as needed  . naproxen sodium (ANAPROX) 220 MG tablet Take 220 mg by mouth daily.   Marland Kitchen SYNTHROID 100 MCG tablet Take 1 tablet (100 mcg total) by mouth daily before breakfast.  . tamoxifen (NOLVADEX) 20 MG tablet Take 1 tablet (20 mg total) by mouth daily.  . [DISCONTINUED] omeprazole (PRILOSEC) 40 MG capsule TAKE 1 CAPSULE DAILY (Patient taking differently: as needed. )   No facility-administered encounter medications on file as of 03/27/2018.     Review of Systems:  ROS  Health Maintenance  Topic Date Due  . INFLUENZA VACCINE  09/26/2017  . TETANUS/TDAP  05/02/2022  . DEXA SCAN  Completed  . PNA vac Low Risk Adult  Completed    Physical Exam: Vitals:   03/27/18 1018  BP: 128/78  Pulse: 68  Temp: (!) 97.5 F (36.4 C)  TempSrc: Oral  SpO2: 98%  Weight: 130  lb (59 kg)  Height: 5\' 1"  (1.549 m)   Body mass index is 24.56 kg/m. Physical Exam  Labs reviewed: Basic Metabolic Panel: Recent Labs    06/12/17 1305 03/24/18 0910  NA 139 143  K 4.0 4.5  CL 105 103  CO2 28 32  GLUCOSE 102 92  BUN 21 18  CREATININE 0.84 0.79  CALCIUM 9.2 9.6  TSH  --  1.18   Liver Function Tests: Recent Labs    06/12/17 1305 03/24/18 0910  AST 22 17  ALT 16 11  ALKPHOS 36*  --   BILITOT 0.4 0.5  PROT 6.2* 6.5  ALBUMIN 3.8  --    No results for input(s): LIPASE, AMYLASE in the last 8760 hours. No results for input(s): AMMONIA in the last 8760 hours. CBC: Recent Labs    06/12/17 1305 03/24/18 0910  WBC 6.0 7.1  NEUTROABS 3.4 2,826  HGB 13.1 13.8  HCT 39.7 41.2  MCV 95.2 92.2  PLT 142* 206   Lipid Panel: No results for input(s): CHOL, HDL, LDLCALC, TRIG, CHOLHDL, LDLDIRECT in the last 8760 hours. No results found for: HGBA1C  Procedures since last visit: No results found.  Assessment/Plan There are no diagnoses linked to this encounter.   Labs/tests ordered:  @ORDERS @ Next appt:  Visit date not found   Kentarius Partington L. Celestina Gironda, D.O. [facility] [address],  [address] Cell Phone (Mon-Fri 8am-5pm):  [phone] On Call:  [phone] & follow prompts after 5pm & weekends Office Phone:  [phone] Office Fax:  [phone]   This encounter was created in error - please disregard.

## 2018-05-01 ENCOUNTER — Encounter: Payer: Self-pay | Admitting: Internal Medicine

## 2018-05-01 ENCOUNTER — Ambulatory Visit (INDEPENDENT_AMBULATORY_CARE_PROVIDER_SITE_OTHER): Payer: Medicare Other | Admitting: Internal Medicine

## 2018-05-01 VITALS — BP 140/82 | HR 80 | Temp 98.7°F | Ht 61.0 in | Wt 130.0 lb

## 2018-05-01 DIAGNOSIS — Z170 Estrogen receptor positive status [ER+]: Secondary | ICD-10-CM | POA: Diagnosis not present

## 2018-05-01 DIAGNOSIS — D500 Iron deficiency anemia secondary to blood loss (chronic): Secondary | ICD-10-CM | POA: Diagnosis not present

## 2018-05-01 DIAGNOSIS — M48061 Spinal stenosis, lumbar region without neurogenic claudication: Secondary | ICD-10-CM

## 2018-05-01 DIAGNOSIS — E034 Atrophy of thyroid (acquired): Secondary | ICD-10-CM

## 2018-05-01 DIAGNOSIS — G2581 Restless legs syndrome: Secondary | ICD-10-CM

## 2018-05-01 DIAGNOSIS — C50411 Malignant neoplasm of upper-outer quadrant of right female breast: Secondary | ICD-10-CM | POA: Diagnosis not present

## 2018-05-01 MED ORDER — TAMOXIFEN CITRATE 20 MG PO TABS: 20.0000 mg | ORAL_TABLET | Freq: Every day | ORAL | 3 refills | Status: DC

## 2018-05-01 NOTE — Progress Notes (Signed)
Location:  [facility] clinic Provider:  Linzey Ramser L. Mariea Clonts, D.O., C.M.D.  Code Status: DNR Goals of Care:  Advanced Directives 10/14/2017  Does Patient Have a Medical Advance Directive? Yes  Type of Paramedic of [place];Living will;Out of facility DNR (pink MOST or yellow form)  Does patient want to make changes to medical advance directive? No - Patient declined  Copy of [place] in Chart? Yes  Pre-existing out of facility DNR order (yellow form or pink MOST form) Yellow form placed in chart (order not valid for inpatient use)     Chief Complaint  Patient presents with  . Medical Management of Chronic Issues    16mth follow-up    HPI: Patient is a 85 y.o. female seen today for medical management of chronic diseases.    Her lower back is hurting more.  She does not drink water unless she is forced to take pills.  It just started around Tuesday to be different than before.  Has not taken anything for it.  She does a lot of pruning of bushes.  Outside of that she sits a lot doing computer things.  She's also been moving a friend's walker in and out of her trunk.    She is still taking her tamoxifen as per oncology and requests refills.    She has no other new concerns to speak of.  She continues to enjoy spending time with her dog, Max, who  Is a service dog for her anxiety.  We reviewed risks of lorazepam at her request and increased tolerance she is developing.  She does seem to need this type of medication for her nerves and wants to continue it.    Lab Results  Component Value Date   TSH 1.18 03/24/2018  thyroid was normal on labs.    She continues to use naproxen daily for her back as tylenol does not provide relief.    Past Medical History:  Diagnosis Date  . Alopecia   . Breast cancer ([place])     Right , chemo, radiation  . DDD (degenerative disc disease)   . Heart palpitations   . History of diverticulitis   . History of  diverticulitis of colon   . Hypercholesteremia   . Hypothyroidism   . Insect bites   . Internal and external bleeding hemorrhoids 08/11/2015  . Personal history of chemotherapy 2012  . Personal history of radiation therapy 2012  . Scoliosis    L4 L5  . Seasonal allergies   . SUI (stress urinary incontinence, female)     Past Surgical History:  Procedure Laterality Date  . BLADDER SUSPENSION    . bowel rescetion    . BOWEL RESECTION    . BREAST BIOPSY Right 03/14/2010  . BREAST LUMPECTOMY  2012   right  . CATARACT EXTRACTION     left  . COLON SURGERY  20 YRS AGO   BOWEL RESECTION  . EYE SURGERY    . HEMORRHOID BANDING    . HERNIA REPAIR     [facility], Dr. Rise Patience  . JOINT REPLACEMENT    . PORT-A-CATH REMOVAL    . TONSILLECTOMY  2001   Diverticulitis  . TOTAL HIP ARTHROPLASTY Right 09/10/2014   Procedure: RIGHT TOTAL HIP ARTHROPLASTY ANTERIOR APPROACH;  Surgeon: Mcarthur Rossetti, MD;  Location: [facility];  Service: Orthopedics;  Laterality: Right;    Allergies  Allergen Reactions  . Amoxicillin Rash  . Diphenhydramine Rash    "It is  the red dye in Benadryl that I''m allergic to"  . Naproxen Sodium Rash    "It is the blue dye in naproxen that I'm allergic to."  . Lipitor [Atorvastatin]   . Meclizine Other (See Comments)    Worsening dizziness and blurred vision  . Amlodipine Other (See Comments)    Dizzy  . Cefdinir Other (See Comments)    Vertigo and headaches   . Codeine Other (See Comments)    unknown  . Ferrous Sulfate     "tears belly up"  . Methocarbamol Other (See Comments)    insomnia   . Prednisone Other (See Comments)    vertigo and headaches  . Skelaxin [Metaxalone] Other (See Comments)    Gastric upset.   . Tramadol Nausea Only  . Hctz [Hydrochlorothiazide] Itching    Outpatient Encounter Medications as of 05/01/2018  Medication Sig  . fish oil-omega-3 fatty acids 1000 MG capsule Take 2 g by mouth 2 (two) times daily.   Marland Kitchen lidocaine (XYLOCAINE)  5 % ointment Apply 1 application topically 3 (three) times daily as needed. For skin pain  . LORazepam (ATIVAN) 0.5 MG tablet Take one tablet daily as needed  . naproxen sodium (ANAPROX) 220 MG tablet Take 220 mg by mouth daily.   Marland Kitchen SYNTHROID 100 MCG tablet Take 1 tablet (100 mcg total) by mouth daily before breakfast.  . tamoxifen (NOLVADEX) 20 MG tablet Take 1 tablet (20 mg total) by mouth daily.   No facility-administered encounter medications on file as of 05/01/2018.     Review of Systems:  Review of Systems  Constitutional: Positive for malaise/fatigue. Negative for chills and fever.  HENT: Positive for hearing loss.   Eyes: Negative for blurred vision.  Respiratory: Negative for cough and shortness of breath.   Cardiovascular: Negative for chest pain, palpitations and leg swelling.  Gastrointestinal: Negative for abdominal pain, blood in stool, constipation and melena.  Genitourinary: Positive for flank pain. Negative for dysuria, frequency, hematuria and urgency.       Dribbling  Musculoskeletal: Positive for back pain. Negative for falls.  Skin: Negative for itching and rash.  Neurological: Negative for dizziness and loss of consciousness.       Restless legs  Endo/Heme/Allergies: Bruises/bleeds easily.  Psychiatric/Behavioral: Positive for memory loss. Negative for depression. The patient is nervous/anxious. The patient does not have insomnia.     Health Maintenance  Topic Date Due  . INFLUENZA VACCINE  09/26/2017  . TETANUS/TDAP  05/02/2022  . DEXA SCAN  Completed  . PNA vac Low Risk Adult  Completed    Physical Exam: Vitals:   05/01/18 1029  BP: 140/82  Pulse: 80  Temp: 98.7 F (37.1 C)  TempSrc: Oral  SpO2: 95%  Weight: 130 lb (59 kg)  Height: 5\' 1"  (1.549 m)   Body mass index is 24.56 kg/m. Physical Exam Vitals signs reviewed.  Constitutional:      Appearance: Normal appearance.  HENT:     Head: Normocephalic and atraumatic.  Cardiovascular:      Rate and Rhythm: Normal rate and regular rhythm.     Pulses: Normal pulses.     Heart sounds: Normal heart sounds.  Pulmonary:     Effort: Pulmonary effort is normal.     Breath sounds: Normal breath sounds.  Abdominal:     General: Bowel sounds are normal.  Musculoskeletal: Normal range of motion.        General: Tenderness present.     Comments: Tender over left sacroiliac area--had  been pointing to cva regions, but not tender there on exam  Skin:    General: Skin is warm and dry.  Neurological:     General: No focal deficit present.     Mental Status: She is alert and oriented to person, place, and time.     Motor: No weakness.     Gait: Gait normal.  Psychiatric:        Mood and Affect: Mood normal.     Labs reviewed: Basic Metabolic Panel: Recent Labs    06/12/17 1305 03/24/18 0910  NA 139 143  K 4.0 4.5  CL 105 103  CO2 28 32  GLUCOSE 102 92  BUN 21 18  CREATININE 0.84 0.79  CALCIUM 9.2 9.6  TSH  --  1.18   Liver Function Tests: Recent Labs    06/12/17 1305 03/24/18 0910  AST 22 17  ALT 16 11  ALKPHOS 36*  --   BILITOT 0.4 0.5  PROT 6.2* 6.5  ALBUMIN 3.8  --    No results for input(s): LIPASE, AMYLASE in the last 8760 hours. No results for input(s): AMMONIA in the last 8760 hours. CBC: Recent Labs    06/12/17 1305 03/24/18 0910  WBC 6.0 7.1  NEUTROABS 3.4 2,826  HGB 13.1 13.8  HCT 39.7 41.2  MCV 95.2 92.2  PLT 142* 206   Lipid Panel: No results for input(s): CHOL, HDL, LDLCALC, TRIG, CHOLHDL, LDLDIRECT in the last 8760 hours. No results found for: HGBA1C  Assessment/Plan 1. Malignant neoplasm of upper-outer quadrant of right breast in female, estrogen receptor positive ([place]) - cont hormonal therapy - tamoxifen (NOLVADEX) 20 MG tablet; Take 1 tablet (20 mg total) by mouth daily.  Dispense: 90 tablet; Refill: 3  2. Iron deficiency anemia due to chronic blood loss H/h wnl now, monitor  3. Hypothyroidism due to acquired atrophy of  thyroid -cont current levothyroxine--clinically and chemically euthyroid  4. Spinal stenosis of lumbar region without neurogenic claudication -ongoing, now with increased pain in right SI region since lifting friend's walker into trunk several times -she is using naproxen sometimes, but really has not been changing anything in her regimen for this in the past 2 days -we decided to watch it and she would see Dr. Ninfa Linden if it persisted (per her intention)  5. Restless legs syndrome (RLS) -cont ativan which helps this as well as her anxiety--she is aware of the risks of chronic benzos in older adults  Labs/tests ordered:  No orders of the defined types were placed in this encounter.  Next appt:  10/21/2018   Jerzee Jerome L. Janaiyah Blackard, D.O. [facility] [address], [place] [address] Cell Phone (Mon-Fri 8am-5pm):  [phone] On Call:  [phone] & follow prompts after 5pm & weekends Office Phone:  [phone] Office Fax:  [phone]

## 2018-06-19 ENCOUNTER — Telehealth: Payer: Self-pay | Admitting: Oncology

## 2018-06-19 NOTE — Telephone Encounter (Signed)
Spoke with pt regarding her [employer] visit - they stated that they do not have the capability to do it. They would request a phone call visit. °

## 2018-06-22 ENCOUNTER — Encounter: Payer: Self-pay | Admitting: Internal Medicine

## 2018-06-23 ENCOUNTER — Inpatient Hospital Stay: Payer: Medicare Other | Attending: Oncology | Admitting: Oncology

## 2018-06-23 ENCOUNTER — Other Ambulatory Visit: Payer: Medicare Other

## 2018-06-23 DIAGNOSIS — Z923 Personal history of irradiation: Secondary | ICD-10-CM

## 2018-06-23 DIAGNOSIS — D509 Iron deficiency anemia, unspecified: Secondary | ICD-10-CM

## 2018-06-23 DIAGNOSIS — Z79810 Long term (current) use of selective estrogen receptor modulators (SERMs): Secondary | ICD-10-CM

## 2018-06-23 DIAGNOSIS — Z170 Estrogen receptor positive status [ER+]: Secondary | ICD-10-CM

## 2018-06-23 DIAGNOSIS — C50411 Malignant neoplasm of upper-outer quadrant of right female breast: Secondary | ICD-10-CM

## 2018-06-23 DIAGNOSIS — Z9221 Personal history of antineoplastic chemotherapy: Secondary | ICD-10-CM | POA: Diagnosis not present

## 2018-06-23 MED ORDER — TAMOXIFEN CITRATE 20 MG PO TABS: 20.0000 mg | ORAL_TABLET | Freq: Every day | ORAL | 3 refills | Status: DC

## 2018-06-23 NOTE — Progress Notes (Signed)
ID: Alice [name]   DOB: May 11, 1930  MR#: [id number]  IOX#:[id number]   Alice [name] SU:  Alice Seltzer, MD;  OTHER:  Alice Silversmith, MD; Alice Rusk, MD, Alice Ramal NP, Alice Beckers MD   CHIEF COMPLAINT:  Right Breast Cancer, history of iron deficiency  CURRENT THERAPY: Tamoxifen   I connected with Alice [name] on 06/23/2018 at  2:30 PM EDT by telephone visit and verified that I am speaking with the correct person using two identifiers.   I discussed the limitations, risks, security and privacy concerns of performing an evaluation and management service by telemedicine and the availability of in-person appointments. I also discussed with the patient that there may be a patient responsible charge related to this service. The patient expressed understanding and agreed to proceed.   Other persons participating in the visit and their role in the encounter: Alice [name], scribe; Alice [name], patient's service dog.  Patient's location: home  Provider's location: [facility]   Chief Complaint: right breast cancer   INTERVAL HISTORY: Alice [name] is here today for follow up of her breast cancer.   She continues on tamoxifen, with good tolerance. She denies any side effects.   Since her last visit, Alice [name] has not undergone any additional studies. She is due for bilateral breast mammogram, which we will schedule for her in October 2020.   REVIEW OF SYSTEMS: [name] reports doing well overall. She walks 2-3 times a day with Alice [name]. The patient denies unusual headaches, visual changes, nausea, vomiting, stiff neck, dizziness, or gait imbalance. There has been no cough, phlegm production, or pleurisy, no chest [name] or pressure, and no change in bowel or bladder habits. The patient denies fever, rash, bleeding, unexplained fatigue or unexplained weight loss. A detailed review of systems was otherwise entirely negative.   HISTORY OF PRESENT ILLNESS: From the original  intake note:  Alice [name] noted a mass in her right breast late December of 2011.  She brought this to Dr. Matthias [name] attention and he set her up for mammography at [place] in [facility].  This showed a large spiculated mass in the posterior upper outer quadrant of the right breast which was palpable.  By ultrasound it measured 3.6 cm.  It was hypoechoic with internal blood flow.  Biopsy was suggested and performed January 17th, 2012.  The pathology from that biopsy ([id number]) showed an invasive ductal carcinoma, Grade 3, ER 95% and PR 47% positive with an MIB-1 of 50%, no HER2 amplification with a CISH ratio of 1.63.    With this information, the patient was referred to Dr. Barry [name], and bilateral breast MRIs were obtained March 20, 2010.  This showed in the posterior 3rd of the outer right breast a 3.8 cm mass with a clip artifact and in addition in the axillary tail of the right breast, a 9 mm rounded mildly irregular mass, corresponding to an abnormal appearing lymph node seen at mammography.  There were no other areas suspicious for enhancement.    Her subsequent history is as summarized above.   PAST MEDICAL HISTORY: Past Medical History:  Diagnosis Date  . Alopecia   . Breast cancer ([place])     Right , chemo, radiation  . DDD (degenerative disc disease)   . Heart palpitations   . History of diverticulitis   . History of diverticulitis of colon   . Hypercholesteremia   . Hypothyroidism   . Insect bites   . Internal and external bleeding hemorrhoids 08/11/2015  .  Personal history of chemotherapy 2012  . Personal history of radiation therapy 2012  . Scoliosis    L4 L5  . Seasonal allergies   . SUI (stress urinary incontinence, female)   1. Greater than 30 pack-year tobacco abuse, the patient quitting in the sixties.  2. History of sling procedure. 3. Seasonal allergies. 4. Status post hernia repair under Alice [name]. 5. Status post small bowel resection for diverticular  disease in 2001.  6. Status post left cataract surgery. 7. History of tonsillectomy. 8. History of palpitations.   9. History of degenerative disk disease. 10. Hypothyroidism. 11. Hypercholesterolemia. 12. Hypertension. Stress urinary incontinence  PAST SURGICAL HISTORY: Past Surgical History:  Procedure Laterality Date  . BLADDER SUSPENSION    . bowel rescetion    . BOWEL RESECTION    . BREAST BIOPSY Right 03/14/2010  . BREAST LUMPECTOMY  2012   right  . CATARACT EXTRACTION     left  . COLON SURGERY  20 YRS AGO   BOWEL RESECTION  . EYE SURGERY    . HEMORRHOID BANDING    . HERNIA REPAIR     RIH, Alice [name]  . JOINT REPLACEMENT    . PORT-A-CATH REMOVAL    . TONSILLECTOMY  2001   Diverticulitis  . TOTAL HIP ARTHROPLASTY Right 09/10/2014   Procedure: RIGHT TOTAL HIP ARTHROPLASTY ANTERIOR APPROACH;  Surgeon: Alice Rossetti, MD;  Location: [facility];  Service: Orthopedics;  Laterality: Right;    FAMILY HISTORY The patient's father died from a stroke at the age of 51.  The patient's mother died with congestive heart failure at the age of 37.  The patient has one sister who died with non-Hodgkin's lymphoma at the age of 50.  She has a brother who was a Manufacturing engineer and died from unknown causes.    GYNECOLOGIC HISTORY: She is GX P3, first pregnancy to term at age 59.  She had menarche at age 34.  She went through menopause in her fifties. She took hormone replacement until the time of her breast cancer diagnosis  SOCIAL HISTORY:  (Updated 12/08/2012) She worked as a Designer, jewellery. She is divorced and lives by herself with her canine companion, Alice [name], who is a Alice [name] terrier and works as a Chief Technology Officer. Son Alice [name] lives in [place] where he works in Engineer, technical sales.  He is the patient's healthcare power of attorney and may be reached at [phone].  The patient's son Alice [name] also works in IT in the [place] area.  Son Alice [name] Child psychotherapist was Scientist, physiological of [employer] but is currently suing [employer] which has let him go. He is stillt teaching there, however. The patient has 5 grandchildren and 2 great-grandchildren. She attends [employer].      ADVANCED DIRECTIVES: In place  HEALTH MAINTENANCE: (Updated 12/08/2012) Social History   Tobacco Use  . Smoking status: Former Smoker    Last attempt to quit: 08/09/1964    Years since quitting: 53.9  . Smokeless tobacco: Never Used  Substance Use Topics  . Alcohol use: Yes    Comment: RARE  . Drug use: No    Colonoscopy: never  PAP: 2001  Bone density: July 2014, normal  Lipid panel: Followed by Dr. Velna Hatchet   Allergies  Allergen Reactions  . Amoxicillin Rash  . Diphenhydramine Rash    "It is the red dye in Benadryl that I''m allergic to"  . Naproxen Sodium Rash    "It is the blue dye  in naproxen that I'm allergic to."  . Lipitor [Atorvastatin]   . Meclizine Other (See Comments)    Worsening dizziness and blurred vision  . Amlodipine Other (See Comments)    Dizzy  . Cefdinir Other (See Comments)    Vertigo and headaches   . Codeine Other (See Comments)    unknown  . Ferrous Sulfate     "tears belly up"  . Methocarbamol Other (See Comments)    insomnia   . Prednisone Other (See Comments)    vertigo and headaches  . Skelaxin [Metaxalone] Other (See Comments)    Gastric upset.   . Tramadol Nausea Only  . Hctz [Hydrochlorothiazide] Itching    Current Outpatient Medications  Medication Sig Dispense Refill  . fish oil-omega-3 fatty acids 1000 MG capsule Take 2 g by mouth 2 (two) times daily.     Marland Kitchen lidocaine (XYLOCAINE) 5 % ointment Apply 1 application topically 3 (three) times daily as needed. For skin [name] 30 g 0  . LORazepam (ATIVAN) 0.5 MG tablet Take one tablet daily as needed 90 tablet 0  . naproxen sodium (ANAPROX) 220 MG tablet Take 220 mg by mouth daily.     Marland Kitchen SYNTHROID 100 MCG tablet Take 1 tablet (100 mcg total) by mouth daily before  breakfast. 90 tablet 1  . tamoxifen (NOLVADEX) 20 MG tablet Take 1 tablet (20 mg total) by mouth daily. 90 tablet 3   No current facility-administered medications for this visit.     OBJECTIVE: Older white woman in no acute distress  There were no vitals filed for this visit.   There is no height or weight on file to calculate BMI.    ECOG FS: 0 There were no vitals filed for this visit.   LAB RESULTS: Lab Results  Component Value Date   WBC 7.1 03/24/2018   NEUTROABS 2,826 03/24/2018   HGB 13.8 03/24/2018   HCT 41.2 03/24/2018   MCV 92.2 03/24/2018   PLT 206 03/24/2018      Chemistry      Component Value Date/Time   NA 143 03/24/2018 0910   NA 141 06/06/2016 1112   K 4.5 03/24/2018 0910   K 4.0 06/06/2016 1112   CL 103 03/24/2018 0910   CL 104 08/04/2012 1350   CO2 32 03/24/2018 0910   CO2 28 06/06/2016 1112   BUN 18 03/24/2018 0910   BUN 18.7 06/06/2016 1112   CREATININE 0.79 03/24/2018 0910   CREATININE 0.8 06/06/2016 1112   GLU 91 10/21/2014      Component Value Date/Time   CALCIUM 9.6 03/24/2018 0910   CALCIUM 9.2 06/06/2016 1112   ALKPHOS 36 (L) 06/12/2017 1305   ALKPHOS 36 (L) 06/06/2016 1112   AST 17 03/24/2018 0910   AST 22 06/12/2017 1305   AST 20 06/06/2016 1112   ALT 11 03/24/2018 0910   ALT 16 06/12/2017 1305   ALT 15 06/06/2016 1112   BILITOT 0.5 03/24/2018 0910   BILITOT 0.4 06/12/2017 1305   BILITOT 0.42 06/06/2016 1112      STUDIES: No results found. Since her last visit, she underwent diagnostic unilateral left breast mammography with CAD and tomography on 12/07/2016 at [place] showing: breast density category C. Stable probably benign left breast asymmetry. Bilateral diagnostic mammography in 6 months was recommended.   ASSESSMENT: 85 y.o. [place] woman,   (1)  status post right breast biopsy March 17, 2010 for a grade 3 invasive ductal carcinoma measuring 3.8 cm by MRI with  a suspicious right axillary lymph node which  was, however, negative by biopsy, the tumor being estrogen and progesterone receptor positive, HER2-negative, with an MIB-1 of 50%.    (2)  Neoadjuvantly, she was treated with dose-dense doxorubicin and cyclophosphamide x4 followed by weekly paclitaxel x 12, completed July 2012 followed by definitive right lumpectomy and sentinel lymph node sampling August 2012 showing a complete pathologic response.   (3)  She completed radiation treatments November 2012 and started tamoxifen at that time.  (4)  discontinued tamoxifen in approximately July of 2014, and was switched to anastrozole for approximately 2 weeks between 10/31/2012 and 11/12/2012,  discontinued secondary to bone and muscle [name].  (5)  started letrozole October 2014, discontinued because of headaches, nausea, vomiting, and aches and pains.  (6) resumed tamoxifen November 2014, plan is to continue that for 10 years  (7) iron deficiency anemia: Received Feraheme 09/20/2016 and 10/05/2016  PLAN:  Autumm is now 7-1/2 years out from definitive surgery for her breast cancer with no evidence of disease recurrence.  This is very favorable.  She is tolerating tamoxifen well and the plan is to continue that a total of 10 years.  She will be due for mammography in October of this year.  I have put in the appropriate order  She is going to try to get back strained as a therapy dog  She knows to call for any other issues that may develop before the next visit.    Keisy Strickler, Virgie Dad, MD  06/23/18 2:44 PM Medical Oncology and Hematology [facility] [address] [place], [place] [address] Tel. [phone]    Fax. [phone]   I, Alice [name], am acting as scribe for Dr. Virgie Dad. Pegi Milazzo.  I, Lurline Del MD, have reviewed the above documentation for accuracy and completeness, and I agree with the above.

## 2018-07-31 ENCOUNTER — Other Ambulatory Visit: Payer: Self-pay | Admitting: Internal Medicine

## 2018-07-31 ENCOUNTER — Telehealth: Payer: Self-pay | Admitting: Internal Medicine

## 2018-07-31 DIAGNOSIS — E034 Atrophy of thyroid (acquired): Secondary | ICD-10-CM

## 2018-07-31 DIAGNOSIS — F419 Anxiety disorder, unspecified: Secondary | ICD-10-CM

## 2018-07-31 MED ORDER — SYNTHROID 100 MCG PO TABS: 100.0000 ug | ORAL_TABLET | Freq: Every day | ORAL | 1 refills | Status: DC

## 2018-07-31 MED ORDER — LORAZEPAM 0.5 MG PO TABS: ORAL_TABLET | ORAL | 1 refills | Status: DC

## 2018-07-31 NOTE — Telephone Encounter (Signed)
Copied from [place] [id number]. Topic: Quick Communication - Rx Refill/Question >> Jul 31, 2018 11:59 AM Margot Ables wrote: Medication: LORazepam (ATIVAN) 0.5 MG tablet & SYNTHROID 100 MCG tablet - pt requesting 90 day supply refills on her medications - she called the [facility] MyChart line and system would not allow pt in - will contact IT for pt  Has the patient contacted their pharmacy? No - pt was trying to request thru mychart and account was deactivated 2x even after resetting password.  Preferred Pharmacy (with phone number or street name): [facility], [place] [phone] (Phone) [phone] (Fax)    Agent: Please be advised that RX refills may take up to 3 business days. We ask that you follow-up with your pharmacy.

## 2018-07-31 NOTE — Progress Notes (Signed)
Medications refilled

## 2018-08-27 ENCOUNTER — Encounter: Payer: Self-pay | Admitting: Orthopaedic Surgery

## 2018-08-27 ENCOUNTER — Ambulatory Visit (INDEPENDENT_AMBULATORY_CARE_PROVIDER_SITE_OTHER): Payer: Medicare Other

## 2018-08-27 ENCOUNTER — Ambulatory Visit (INDEPENDENT_AMBULATORY_CARE_PROVIDER_SITE_OTHER): Payer: Medicare Other | Admitting: Orthopaedic Surgery

## 2018-08-27 ENCOUNTER — Other Ambulatory Visit: Payer: Self-pay

## 2018-08-27 DIAGNOSIS — M25552 Pain in left hip: Secondary | ICD-10-CM

## 2018-08-27 DIAGNOSIS — M545 Low back pain, unspecified: Secondary | ICD-10-CM

## 2018-08-27 NOTE — Progress Notes (Signed)
Office Visit Note   Patient: Alice [name]           Date of Birth: 23-Mar-1930           MRN: [id number] Visit Date: 08/27/2018              Requested by: [name], DO [place],  [place] [id number] PCP: [name], DO   Assessment & Plan: Visit Diagnoses:  1. Pain in left hip   2. Bilateral low back pain, unspecified chronicity, unspecified whether sciatica present     Plan: She does have Voltaren gel at home soft certainly recommended that she use this on her back and her hip.  She is not interested in any type of injection or other intervention.  She is really getting a good idea of what her baseline is per her words.  Follow-up can be as needed.  All question concerns were answered and addressed.  Follow-Up Instructions: Return if symptoms worsen or fail to improve.   Orders:  Orders Placed This Encounter  Procedures  . XR HIP UNILAT W OR W/O PELVIS 1V LEFT  . XR Lumbar Spine 2-3 Views   No orders of the defined types were placed in this encounter.     Procedures: No procedures performed   Clinical Data: No additional findings.   Subjective: Chief Complaint  Patient presents with  . Lower Back - Pain  The patient is a very pleasant 85 year old female well-known to me that I followed for many years now.  She does have a history of a right total hip arthroplasty.  She comes in with left hip pain that hurts on the lateral aspect of her hip.  She also has chronic low back pain in the mid lumbar spine.  She denies any radicular components.  She denies any sciatica.  She is a very young appearing 85.  She does not walk with an assistive device at all.  She denies any numbness and tingling her feet or weakness in her legs.  HPI  Review of Systems She currently denies any headache, chest pain, shortness of breath, fever, chills, nausea, vomiting  Objective: Vital Signs: There were no vitals taken for this visit.  Physical Exam She is alert and  orient x3 and in no acute distress Ortho Exam Examination of both hips show the move fluidly with no pain in the groin at all.  Her left hip shows pain to palpation of the trochanteric area only.  She has negative straight leg raise bilaterally and excellent strength in both lower extremities.  She has normal sensation in both lower extremities.  She does have pain with flexion extension of the lumbar spine and an obvious scoliosis. Specialty Comments:  No specialty comments available.  Imaging: Xr Hip Unilat W Or W/o Pelvis 1v Left  Result Date: 08/27/2018 An AP pelvis and lateral left hip shows a well-maintained hip joint space with no significant arthritis at all in that hip.  There is no acute findings.  Xr Lumbar Spine 2-3 Views  Result Date: 08/27/2018 An AP and lateral lumbar spine shows severe degenerative scoliosis.  There is disc space narrowing at multiple levels and large osteophytes at multiple levels.    PMFS History: Patient Active Problem List   Diagnosis Date Noted  . Iron deficiency anemia 08/12/2016  . Mild anemia 07/06/2016  . Left torticollis 08/11/2015  . Hypothyroidism due to acquired atrophy of thyroid 08/11/2015  . Internal and external  bleeding hemorrhoids 08/11/2015  . Osteoarthritis of right hip 09/10/2014  . Status post total replacement of right hip 09/10/2014  . Malignant neoplasm of upper-outer quadrant of right breast in female, estrogen receptor positive ([place]) 01/07/2013  . Arthralgia 12/08/2012  . Anxiety 02/12/2012  . Cat bite of finger 08/10/2011  . Cellulitis 08/10/2011   Past Medical History:  Diagnosis Date  . Alopecia   . Breast cancer ([place])     Right , chemo, radiation  . DDD (degenerative disc disease)   . Heart palpitations   . History of diverticulitis   . History of diverticulitis of colon   . Hypercholesteremia   . Hypothyroidism   . Insect bites   . Internal and external bleeding hemorrhoids 08/11/2015  . Personal history of  chemotherapy 2012  . Personal history of radiation therapy 2012  . Scoliosis    L4 L5  . Seasonal allergies   . SUI (stress urinary incontinence, female)     Family History  Problem Relation Age of Onset  . Heart disease Mother   . Heart disease Father   . CVA Father 101  . Cancer Sister 54       lymphoma  . Asthma Son   . Asthma Son   . Asthma Son   . Skin cancer Brother 77       melanoma  . Colon cancer Unknown     Past Surgical History:  Procedure Laterality Date  . BLADDER SUSPENSION    . bowel rescetion    . BOWEL RESECTION    . BREAST BIOPSY Right 03/14/2010  . BREAST LUMPECTOMY  2012   right  . CATARACT EXTRACTION     left  . COLON SURGERY  20 YRS AGO   BOWEL RESECTION  . EYE SURGERY    . HEMORRHOID BANDING    . HERNIA REPAIR     RIH, Dr. Rise Patience  . JOINT REPLACEMENT    . PORT-A-CATH REMOVAL    . TONSILLECTOMY  2001   Diverticulitis  . TOTAL HIP ARTHROPLASTY Right 09/10/2014   Procedure: RIGHT TOTAL HIP ARTHROPLASTY ANTERIOR APPROACH;  Surgeon: Mcarthur Rossetti, MD;  Location: [facility];  Service: Orthopedics;  Laterality: Right;   Social History   Occupational History  . Occupation: Retired    Fish farm manager: RETIRED  Tobacco Use  . Smoking status: Former Smoker    Quit date: 08/09/1964    Years since quitting: 54.0  . Smokeless tobacco: Never Used  Substance and Sexual Activity  . Alcohol use: Yes    Comment: RARE  . Drug use: No  . Sexual activity: Not Currently

## 2018-09-15 ENCOUNTER — Encounter: Payer: Self-pay | Admitting: Internal Medicine

## 2018-09-15 ENCOUNTER — Other Ambulatory Visit: Payer: Self-pay

## 2018-09-15 ENCOUNTER — Ambulatory Visit (INDEPENDENT_AMBULATORY_CARE_PROVIDER_SITE_OTHER): Payer: Medicare Other | Admitting: Internal Medicine

## 2018-09-15 VITALS — BP 140/96 | HR 71 | Temp 97.6°F | Resp 20 | Ht 61.0 in | Wt 131.0 lb

## 2018-09-15 DIAGNOSIS — C50411 Malignant neoplasm of upper-outer quadrant of right female breast: Secondary | ICD-10-CM | POA: Diagnosis not present

## 2018-09-15 DIAGNOSIS — D500 Iron deficiency anemia secondary to blood loss (chronic): Secondary | ICD-10-CM | POA: Diagnosis not present

## 2018-09-15 DIAGNOSIS — I700 Atherosclerosis of aorta: Secondary | ICD-10-CM | POA: Insufficient documentation

## 2018-09-15 DIAGNOSIS — F419 Anxiety disorder, unspecified: Secondary | ICD-10-CM | POA: Diagnosis not present

## 2018-09-15 DIAGNOSIS — N3941 Urge incontinence: Secondary | ICD-10-CM

## 2018-09-15 DIAGNOSIS — Z170 Estrogen receptor positive status [ER+]: Secondary | ICD-10-CM | POA: Diagnosis not present

## 2018-09-15 DIAGNOSIS — M48061 Spinal stenosis, lumbar region without neurogenic claudication: Secondary | ICD-10-CM | POA: Diagnosis not present

## 2018-09-15 DIAGNOSIS — E034 Atrophy of thyroid (acquired): Secondary | ICD-10-CM

## 2018-09-15 NOTE — Patient Instructions (Addendum)
Try to urinate on a regular schedule to prevent reaching urgency.  Start hourly and spread out the trips to the restroom as your body allows.   Kegel exercises may help the muscle strength.    Kegel Exercises  Kegel exercises can help strengthen your pelvic floor muscles. The pelvic floor is a group of muscles that support your rectum, small intestine, and bladder. In females, pelvic floor muscles also help support the womb (uterus). These muscles help you control the flow of urine and stool. Kegel exercises are painless and simple, and they do not require any equipment. Your provider may suggest Kegel exercises to:  Improve bladder and bowel control.  Improve sexual response.  Improve weak pelvic floor muscles after surgery to remove the uterus (hysterectomy) or pregnancy (females).  Improve weak pelvic floor muscles after prostate gland removal or surgery (males). Kegel exercises involve squeezing your pelvic floor muscles, which are the same muscles you squeeze when you try to stop the flow of urine or keep from passing gas. The exercises can be done while sitting, standing, or lying down, but it is best to vary your position. Exercises How to do Kegel exercises: 1. Squeeze your pelvic floor muscles tight. You should feel a tight lift in your rectal area. If you are a female, you should also feel a tightness in your vaginal area. Keep your stomach, buttocks, and legs relaxed. 2. Hold the muscles tight for up to 10 seconds. 3. Breathe normally. 4. Relax your muscles. 5. Repeat as told by your health care provider. Repeat this exercise daily as told by your health care provider. Continue to do this exercise for at least 4-6 weeks, or for as long as told by your health care provider. You may be referred to a physical therapist who can help you learn more about how to do Kegel exercises. Depending on your condition, your health care provider may recommend:  Varying how long you squeeze your  muscles.  Doing several sets of exercises every day.  Doing exercises for several weeks.  Making Kegel exercises a part of your regular exercise routine. This information is not intended to replace advice given to you by your health care provider. Make sure you discuss any questions you have with your health care provider. Document Released: 01/30/2012 Document Revised: 10/02/2017 Document Reviewed: 10/02/2017 Elsevier Patient Education  2020 [employer].

## 2018-09-15 NOTE — Progress Notes (Signed)
Location:  [facility] clinic Provider:  Roberts Bon L. Mariea Clonts, D.O., C.M.D.  Code Status: DNR Goals of Care:  Advanced Directives 10/14/2017  Does Patient Have a Medical Advance Directive? Yes  Type of Paramedic of [place];Living will;Out of facility DNR (pink MOST or yellow form)  Does patient want to make changes to medical advance directive? No - Patient declined  Copy of [place] in Chart? Yes  Pre-existing out of facility DNR order (yellow form or pink MOST form) Yellow form placed in chart (order not valid for inpatient use)     Chief Complaint  Patient presents with  . Acute Visit    back pain    HPI: Patient is a 85 y.o. female seen today for acute visit for back pain.  Currently she uses naproxen for pain.   Weight is stable.   She is having pain across her lower back.  She saw Dr. Ninfa Linden who recommended a shot.  She declined.  Says it's not just spinal.  She's tired most of the time though she "doesn't do a damn thing".  She admits to 2 years on earth.  It's too hot to do walks or garden except very early or late.  7am or 8pm in the neighborhood allows a little socialization and exercise.  She does not really use the voltaren gel b/c she cannot rub her own back.  Discussed use of heat or ice for pain--then says she thinks it is kidneys.  She got a bee sting on the bottom of her left foot that hurt, but it's not a big deal.    Her son advised she should tell me that she used to chew poison ivy to show off that she didn't  She pulled all the poison ivy at her son's b/c he is terribly allergic.    She dribbles a lot.  No hematuria.  No dysuria, frequency.  Has to wash her PJs a lot.  Does have urgency and leakage.    She has not felt motivated lately.  Not helped by weather, virus, Trump.  She does not want to take medication for depression.    She has two states left to visit on her bucket list--[place] and [place].    Past Medical  History:  Diagnosis Date  . Alopecia   . Breast cancer ([place])     Right , chemo, radiation  . DDD (degenerative disc disease)   . Heart palpitations   . History of diverticulitis   . History of diverticulitis of colon   . Hypercholesteremia   . Hypothyroidism   . Insect bites   . Internal and external bleeding hemorrhoids 08/11/2015  . Personal history of chemotherapy 2012  . Personal history of radiation therapy 2012  . Scoliosis    L4 L5  . Seasonal allergies   . SUI (stress urinary incontinence, female)     Past Surgical History:  Procedure Laterality Date  . BLADDER SUSPENSION    . bowel rescetion    . BOWEL RESECTION    . BREAST BIOPSY Right 03/14/2010  . BREAST LUMPECTOMY  2012   right  . CATARACT EXTRACTION     left  . COLON SURGERY  20 YRS AGO   BOWEL RESECTION  . EYE SURGERY    . HEMORRHOID BANDING    . HERNIA REPAIR     RIH, Dr. Rise Patience  . JOINT REPLACEMENT    . PORT-A-CATH REMOVAL    . TONSILLECTOMY  2001  Diverticulitis  . TOTAL HIP ARTHROPLASTY Right 09/10/2014   Procedure: RIGHT TOTAL HIP ARTHROPLASTY ANTERIOR APPROACH;  Surgeon: Mcarthur Rossetti, MD;  Location: [facility];  Service: Orthopedics;  Laterality: Right;    Allergies  Allergen Reactions  . Amoxicillin Rash  . Diphenhydramine Rash    "It is the red dye in Benadryl that I''m allergic to"  . Naproxen Sodium Rash    "It is the blue dye in naproxen that I'm allergic to."  . Lipitor [Atorvastatin]   . Meclizine Other (See Comments)    Worsening dizziness and blurred vision  . Amlodipine Other (See Comments)    Dizzy  . Cefdinir Other (See Comments)    Vertigo and headaches   . Codeine Other (See Comments)    unknown  . Ferrous Sulfate     "tears belly up"  . Methocarbamol Other (See Comments)    insomnia   . Prednisone Other (See Comments)    vertigo and headaches  . Skelaxin [Metaxalone] Other (See Comments)    Gastric upset.   . Tramadol Nausea Only  . Hctz  [Hydrochlorothiazide] Itching    Outpatient Encounter Medications as of 09/15/2018  Medication Sig  . fish oil-omega-3 fatty acids 1000 MG capsule Take 2 g by mouth 2 (two) times daily.   Marland Kitchen lidocaine (XYLOCAINE) 5 % ointment Apply 1 application topically 3 (three) times daily as needed. For skin pain  . LORazepam (ATIVAN) 0.5 MG tablet Take one tablet daily as needed  . Multiple Vitamin (MULTIVITAMIN) tablet Take 1 tablet by mouth daily.  . naproxen sodium (ANAPROX) 220 MG tablet Take 220 mg by mouth daily.   Marland Kitchen SYNTHROID 100 MCG tablet Take 1 tablet (100 mcg total) by mouth daily before breakfast.  . tamoxifen (NOLVADEX) 20 MG tablet Take 1 tablet (20 mg total) by mouth daily.   No facility-administered encounter medications on file as of 09/15/2018.     Review of Systems:  Review of Systems  Constitutional: Positive for malaise/fatigue. Negative for chills, fever and weight loss.  HENT: Negative for congestion.   Eyes: Negative for blurred vision.       Glasses  Respiratory: Negative for cough and shortness of breath.   Cardiovascular: Negative for chest pain, palpitations and leg swelling.  Gastrointestinal: Negative for abdominal pain, blood in stool, constipation, diarrhea and melena.  Genitourinary: Positive for urgency. Negative for dysuria, flank pain, frequency and hematuria.  Musculoskeletal: Positive for back pain, joint pain and myalgias. Negative for falls.       Admits to sitting in front of her computer a lot  Skin: Negative for itching and rash.  Neurological: Negative for dizziness and loss of consciousness.  Endo/Heme/Allergies: Bruises/bleeds easily.  Psychiatric/Behavioral: Positive for depression and memory loss. The patient is nervous/anxious and has insomnia.        Apathy, lack of motivation, admits to lack of physical activity affecting sleep    Health Maintenance  Topic Date Due  . INFLUENZA VACCINE  09/27/2018  . TETANUS/TDAP  05/02/2022  . DEXA SCAN   Completed  . PNA vac Low Risk Adult  Completed    Physical Exam: Vitals:   09/15/18 1004  BP: (!) 140/96  Pulse: 71  Resp: 20  Temp: 97.6 F (36.4 C)  SpO2: 97%  Weight: 131 lb (59.4 kg)  Height: 5\' 1"  (1.549 m)   Body mass index is 24.75 kg/m. Physical Exam Constitutional:      General: She is not in acute distress.  Appearance: Normal appearance. She is normal weight. She is not ill-appearing or toxic-appearing.  HENT:     Head: Normocephalic and atraumatic.  Eyes:     Comments: glasses  Cardiovascular:     Rate and Rhythm: Normal rate and regular rhythm.     Pulses: Normal pulses.     Heart sounds: Normal heart sounds.  Pulmonary:     Effort: Pulmonary effort is normal.     Breath sounds: Normal breath sounds. No wheezing, rhonchi or rales.  Musculoskeletal: Normal range of motion.        General: Tenderness present.     Comments: Tender over lower back lateral aspects above SI joints  Skin:    General: Skin is warm and dry.     Capillary Refill: Capillary refill takes less than 2 seconds.  Neurological:     General: No focal deficit present.     Mental Status: She is alert and oriented to person, place, and time.  Psychiatric:        Mood and Affect: Mood normal.     Labs reviewed: Basic Metabolic Panel: Recent Labs    03/24/18 0910  NA 143  K 4.5  CL 103  CO2 32  GLUCOSE 92  BUN 18  CREATININE 0.79  CALCIUM 9.6  TSH 1.18   Liver Function Tests: Recent Labs    03/24/18 0910  AST 17  ALT 11  BILITOT 0.5  PROT 6.5   No results for input(s): LIPASE, AMYLASE in the last 8760 hours. No results for input(s): AMMONIA in the last 8760 hours. CBC: Recent Labs    03/24/18 0910  WBC 7.1  NEUTROABS 2,826  HGB 13.8  HCT 41.2  MCV 92.2  PLT 206   Lipid Panel: No results for input(s): CHOL, HDL, LDLCALC, TRIG, CHOLHDL, LDLDIRECT in the last 8760 hours. No results found for: HGBA1C  Procedures since last visit: Xr Hip Unilat W Or W/o  Pelvis 1v Left  Result Date: 08/27/2018 An AP pelvis and lateral left hip shows a well-maintained hip joint space with no significant arthritis at all in that hip.  There is no acute findings.  Xr Lumbar Spine 2-3 Views  Result Date: 08/27/2018 An AP and lateral lumbar spine shows severe degenerative scoliosis.  There is disc space narrowing at multiple levels and large osteophytes at multiple levels.   Assessment/Plan 1. Malignant neoplasm of upper-outer quadrant of right breast in female, estrogen receptor positive ([place]) -continues on tamoxifen through Dr. Jana Hakim, doing fine  2. Iron deficiency anemia due to chronic blood loss -has been stable, monitor 1-2 times a year  3. Spinal stenosis of lumbar region without neurogenic claudication -ongoing, refused injections for this and hip pain with Dr. Ninfa Linden, continues to use daily nsaid -discussed heat/ice--she thinks it's her kidneys, but they have been normal each time checked  4. Hypothyroidism due to acquired atrophy of thyroid -has been euthyroid  5. Anxiety -ongoing, cont lorazepam as needed -refuses antidepressant therapy  6. Urge incontinence -counseled on scheduled toileting and kegels  7. Atherosclerosis of aorta ([place]) -noted incidentally on imaging and persists -cont secondary prevention as able  Labs/tests ordered:  No new today  Next appt:  10/21/2018   Amazing Cowman L. Axyl Sitzman, D.O. [facility] [address],  [address] Cell Phone (Mon-Fri 8am-5pm):  [phone] On Call:  [phone] & follow prompts after 5pm & weekends Office Phone:  [phone] Office Fax:  [phone]

## 2018-10-20 ENCOUNTER — Encounter: Payer: Medicare Other | Admitting: Family

## 2018-10-20 ENCOUNTER — Ambulatory Visit: Payer: Self-pay

## 2018-10-21 ENCOUNTER — Encounter: Payer: Medicare Other | Admitting: Family

## 2018-10-22 ENCOUNTER — Ambulatory Visit (INDEPENDENT_AMBULATORY_CARE_PROVIDER_SITE_OTHER): Payer: Medicare Other | Admitting: Nurse Practitioner

## 2018-10-22 ENCOUNTER — Encounter: Payer: Self-pay | Admitting: Nurse Practitioner

## 2018-10-22 ENCOUNTER — Other Ambulatory Visit: Payer: Self-pay

## 2018-10-22 ENCOUNTER — Encounter: Payer: Medicare Other | Admitting: Family

## 2018-10-22 VITALS — BP 142/80 | HR 67 | Temp 98.0°F | Ht 61.0 in | Wt 132.0 lb

## 2018-10-22 DIAGNOSIS — Z23 Encounter for immunization: Secondary | ICD-10-CM | POA: Diagnosis not present

## 2018-10-22 DIAGNOSIS — E2839 Other primary ovarian failure: Secondary | ICD-10-CM

## 2018-10-22 DIAGNOSIS — Z0000 Encounter for general adult medical examination without abnormal findings: Secondary | ICD-10-CM

## 2018-10-22 NOTE — Progress Notes (Signed)
Subjective:   Alice [name] is a 85 y.o. female who presents for Medicare Annual (Subsequent) preventive examination. Pt is seen in office   Review of Systems:   Cardiac Risk Factors include: advanced age (>93men, >25 women);dyslipidemia;family history of premature cardiovascular disease     Objective:     Vitals: BP (!) 142/80   Pulse 67   Temp 98 F (36.7 C) (Oral)   Ht 5\' 1"  (1.549 m)   Wt 132 lb (59.9 kg)   SpO2 97%   BMI 24.94 kg/m   Body mass index is 24.94 kg/m.  Advanced Directives 10/14/2017 09/23/2016 08/24/2016 02/09/2016 02/09/2016 02/09/2016 08/11/2015  Does Patient Have a Medical Advance Directive? Yes Yes Yes Yes Yes Yes Yes  Type of Paramedic of [place];Living will;Out of facility DNR (pink MOST or yellow form) [place];Living will [place];Living will Out of facility DNR (pink MOST or yellow form) [place];Living will Healthcare Power of [place];Living will  Does patient want to make changes to medical advance directive? No - Patient declined No - Patient declined No - Patient declined - No - Patient declined - -  Copy of [place] in Chart? Yes No - copy requested No - copy requested Yes Yes No - copy requested Yes  Pre-existing out of facility DNR order (yellow form or pink MOST form) Yellow form placed in chart (order not valid for inpatient use) - - - - - -    Tobacco Social History   Tobacco Use  Smoking Status Former Smoker  . Quit date: 08/09/1964  . Years since quitting: 54.2  Smokeless Tobacco Never Used     Counseling given: Not Answered   Clinical Intake:  Pre-visit preparation completed: Yes  Pain : No/denies pain     BMI - recorded: 24.94 Nutritional Status: BMI of 19-24  Normal Diabetes: No  How often do you need to have someone help you when you read instructions, pamphlets, or other written  materials from your doctor or pharmacy?: 1 - Never What is the last grade level you completed in school?: associates degress and BA  Interpreter Needed?: No     Past Medical History:  Diagnosis Date  . Alopecia   . Breast cancer ([place])     Right , chemo, radiation  . DDD (degenerative disc disease)   . Heart palpitations   . History of diverticulitis   . History of diverticulitis of colon   . Hypercholesteremia   . Hypothyroidism   . Insect bites   . Internal and external bleeding hemorrhoids 08/11/2015  . Personal history of chemotherapy 2012  . Personal history of radiation therapy 2012  . Scoliosis    L4 L5  . Seasonal allergies   . SUI (stress urinary incontinence, female)    Past Surgical History:  Procedure Laterality Date  . BLADDER SUSPENSION    . bowel rescetion    . BOWEL RESECTION    . BREAST BIOPSY Right 03/14/2010  . BREAST LUMPECTOMY  2012   right  . CATARACT EXTRACTION     left  . COLON SURGERY  20 YRS AGO   BOWEL RESECTION  . EYE SURGERY    . HEMORRHOID BANDING    . HERNIA REPAIR     RIH, Dr. Rise Patience  . JOINT REPLACEMENT    . PORT-A-CATH REMOVAL    . TONSILLECTOMY  2001   Diverticulitis  . TOTAL HIP  ARTHROPLASTY Right 09/10/2014   Procedure: RIGHT TOTAL HIP ARTHROPLASTY ANTERIOR APPROACH;  Surgeon: Mcarthur Rossetti, MD;  Location: [facility];  Service: Orthopedics;  Laterality: Right;   Family History  Problem Relation Age of Onset  . Heart disease Mother   . Heart disease Father   . CVA Father 19  . Cancer Sister 32       lymphoma  . Asthma Son   . Asthma Son   . Asthma Son   . Skin cancer Brother 53       melanoma  . Colon cancer Other    Social History   Socioeconomic History  . Marital status: Divorced    Spouse name: Not on file  . Number of children: 3  . Years of education: Not on file  . Highest education level: Not on file  Occupational History  . Occupation: Retired    Fish farm manager: RETIRED  Social Needs  . Financial  resource strain: Not hard at all  . Food insecurity    Worry: Never true    Inability: Never true  . Transportation needs    Medical: No    Non-medical: No  Tobacco Use  . Smoking status: Former Smoker    Quit date: 08/09/1964    Years since quitting: 54.2  . Smokeless tobacco: Never Used  Substance and Sexual Activity  . Alcohol use: Yes    Comment: RARE  . Drug use: No  . Sexual activity: Not Currently  Lifestyle  . Physical activity    Days per week: 7 days    Minutes per session: 90 min  . Stress: To some extent  Relationships  . Social connections    Talks on phone: More than three times a week    Gets together: Three times a week    Attends religious service: Never    Active member of club or organization: Yes    Attends meetings of clubs or organizations: More than 4 times per year    Relationship status: Divorced  Other Topics Concern  . Not on file  Social History Narrative   Tobacco use, amount per day now: N/A      Past tobacco use, amount per day: 1 pack, Quit 1969      How many years did you use tobacco: N/A      Alcohol use (drinks per week): Occassionally. Glass of wine- Holidays      Diet: N/A      Do you drink/eat things with caffeine? Coffee and Tea      Marital status: Divorced             What year were you married?      Do you live in a house, apartment, assisted living, condo, trailer? Condo      Is it one or more stories? One      How many persons live in your home? Self and Service Dog      Do you have any pets in your home? 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 Walking            How often? Usually daily 2-4 times      Do you have a living will? Yes      Do you have a DNR form? N/A            If not, do you want  to discuss one? N/A      Do you have signed POA/HPOA forms? No                 Outpatient Encounter Medications as of 10/22/2018  Medication Sig  .  fish oil-omega-3 fatty acids 1000 MG capsule Take 1 g by mouth daily.   Marland Kitchen lidocaine (XYLOCAINE) 5 % ointment Apply 1 application topically 3 (three) times daily as needed. For skin pain  . LORazepam (ATIVAN) 0.5 MG tablet Take one tablet daily as needed  . Multiple Vitamin (MULTIVITAMIN) tablet Take 1 tablet by mouth daily.  . naproxen sodium (ANAPROX) 220 MG tablet Take 220 mg by mouth daily.   Marland Kitchen SYNTHROID 100 MCG tablet Take 1 tablet (100 mcg total) by mouth daily before breakfast.  . tamoxifen (NOLVADEX) 20 MG tablet Take 1 tablet (20 mg total) by mouth daily.   No facility-administered encounter medications on file as of 10/22/2018.     Activities of Daily Living In your present state of health, do you have any difficulty performing the following activities: 10/22/2018  Hearing? N  Vision? N  Difficulty concentrating or making decisions? N  Walking or climbing stairs? N  Dressing or bathing? N  Doing errands, shopping? N  Preparing Food and eating ? N  Using the Toilet? N  In the past six months, have you accidently leaked urine? Y  Do you have problems with loss of bowel control? N  Managing your Medications? N  Managing your Finances? N  Housekeeping or managing your Housekeeping? N  Some recent data might be hidden    Patient Care Team: [name], DO as PCP - General (Geriatric Medicine) Mcarthur Rossetti, MD as Consulting Physician (Orthopedic Surgery) Magrinat, Virgie Dad, MD as Consulting Physician (Oncology)    Assessment:   This is a routine wellness examination for Alice [name].  Exercise Activities and Dietary recommendations Current Exercise Habits: Home exercise routine, Type of exercise: walking, Time (Minutes): 30, Frequency (Times/Week): 7, Weekly Exercise (Minutes/Week): 210, Intensity: Moderate  Goals    . Increase water intake     Pt will try to increase water intake.        Fall Risk Fall Risk  10/22/2018 09/15/2018 05/01/2018 03/27/2018 10/14/2017   Falls in the past year? 0 0 0 0 Yes  Number falls in past yr: 0 - 0 0 1  Injury with Fall? 0 - 0 0 No   Is the patient's home free of loose throw rugs in walkways, pet beds, electrical cords, etc?   yes      Grab bars in the bathroom? no      Handrails on the stairs?   no      Adequate lighting?   yes  Timed Get Up and Go performed: na  Depression Screen PHQ 2/9 Scores 10/22/2018 09/15/2018 05/01/2018 03/27/2018  PHQ - 2 Score 0 0 0 0     Cognitive Function MMSE - Mini Mental State Exam 10/22/2018 10/14/2017 08/24/2016 07/20/2015  Orientation to time 5 4 5 5   Orientation to Place 5 5 4 5   Registration 3 3 3 3   Attention/ Calculation 5 5 5 5   Recall 3 3 3 3   Language- name 2 objects 2 2 2 2   Language- repeat 1 1 1 1   Language- follow 3 step command 3 3 2 3   Language- read & follow direction 1 1 1 1   Write a sentence 1 1 1 1   Copy design 1 1 1  1  Total score 30 29 28 30         Immunization History  Administered Date(s) Administered  . Influenza, High Dose Seasonal PF 03/18/2017  . Zoster 02/27/1999    Qualifies for Shingles Vaccine?yes   Screening Tests Health Maintenance  Topic Date Due  . INFLUENZA VACCINE  09/27/2018  . TETANUS/TDAP  05/02/2022  . DEXA SCAN  Completed  . PNA vac Low Risk Adult  Completed    Cancer Screenings: Lung: Low Dose CT Chest recommended if Age 69-80 years, 30 pack-year currently smoking OR have quit w/in 15years. Patient does not qualify. Breast:  Up to date on Mammogram? Yes   Up to date of Bone Density/Dexa? No Colorectal: AGED OUT   Additional Screenings:  Hepatitis C Screening: na     Plan:      I have personally reviewed and noted the following in the patient's chart:   . Medical and social history . Use of alcohol, tobacco or illicit drugs  . Current medications and supplements . Functional ability and status . Nutritional status . Physical activity . Advanced directives . List of other physicians . Hospitalizations,  surgeries, and ER visits in previous 12 months . Vitals . Screenings to include cognitive, depression, and falls . Referrals and appointments  In addition, I have reviewed and discussed with patient certain preventive protocols, quality metrics, and best practice recommendations. A written personalized care plan for preventive services as well as general preventive health recommendations were provided to patient.     Lauree Chandler, NP  10/22/2018

## 2018-10-22 NOTE — Patient Instructions (Signed)
Alice [name] , Thank you for taking time to come for your Medicare Wellness Visit. I appreciate your ongoing commitment to your health goals. Please review the following plan we discussed and let me know if I can assist you in the future.   Screening recommendations/referrals: Colonoscopy aged out Mammogram up to date Bone Density order placed, can call [phone] to make appt Recommended yearly ophthalmology/optometry visit for glaucoma screening and checkup Recommended yearly dental visit for hygiene and checkup  Vaccinations: Influenza vaccine- GIVEN TODAY Pneumococcal vaccine- due for both prevnar 13 and pneumococcal 23  Tdap vaccine- declines  Shingles vaccine to request at pharmacy if interested     Advanced directives: on file.  Conditions/risks identified: none.  Next appointment: 1 year.    Preventive Care 83 Years and Older, Female Preventive care refers to lifestyle choices and visits with your health care provider that can promote health and wellness. What does preventive care include?  A yearly physical exam. This is also called an annual well check.  Dental exams once or twice a year.  Routine eye exams. Ask your health care provider how often you should have your eyes checked.  Personal lifestyle choices, including:  Daily care of your teeth and gums.  Regular physical activity.  Eating a healthy diet.  Avoiding tobacco and drug use.  Limiting alcohol use.  Practicing safe sex.  Taking low-dose aspirin every day.  Taking vitamin and mineral supplements as recommended by your health care provider. What happens during an annual well check? The services and screenings done by your health care provider during your annual well check will depend on your age, overall health, lifestyle risk factors, and family history of disease. Counseling  Your health care provider may ask you questions about your:  Alcohol use.  Tobacco use.  Drug use.   Emotional well-being.  Home and relationship well-being.  Sexual activity.  Eating habits.  History of falls.  Memory and ability to understand (cognition).  Work and work Statistician.  Reproductive health. Screening  You may have the following tests or measurements:  Height, weight, and BMI.  Blood pressure.  Lipid and cholesterol levels. These may be checked every 5 years, or more frequently if you are over 83 years old.  Skin check.  Lung cancer screening. You may have this screening every year starting at age 83 if you have a 30-pack-year history of smoking and currently smoke or have quit within the past 15 years.  Fecal occult blood test (FOBT) of the stool. You may have this test every year starting at age 83.  Flexible sigmoidoscopy or colonoscopy. You may have a sigmoidoscopy every 5 years or a colonoscopy every 10 years starting at age 83.  Hepatitis C blood test.  Hepatitis B blood test.  Sexually transmitted disease (STD) testing.  Diabetes screening. This is done by checking your blood sugar (glucose) after you have not eaten for a while (fasting). You may have this done every 1-3 years.  Bone density scan. This is done to screen for osteoporosis. You may have this done starting at age 83.  Mammogram. This may be done every 1-2 years. Talk to your health care provider about how often you should have regular mammograms. Talk with your health care provider about your test results, treatment options, and if necessary, the need for more tests. Vaccines  Your health care provider may recommend certain vaccines, such as:  Influenza vaccine. This is recommended every year.  Tetanus, diphtheria, and acellular  pertussis (Tdap, Td) vaccine. You may need a Td booster every 10 years.  Zoster vaccine. You may need this after age 83.  Pneumococcal 13-valent conjugate (PCV13) vaccine. One dose is recommended after age 83.  Pneumococcal polysaccharide (PPSV23)  vaccine. One dose is recommended after age 83. Talk to your health care provider about which screenings and vaccines you need and how often you need them. This information is not intended to replace advice given to you by your health care provider. Make sure you discuss any questions you have with your health care provider. Document Released: 03/11/2015 Document Revised: 11/02/2015 Document Reviewed: 12/14/2014 Elsevier Interactive Patient Education  2017 [employer] Prevention in the Home Falls can cause injuries. They can happen to people of all ages. There are many things you can do to make your home safe and to help prevent falls. What can I do on the outside of my home?  Regularly fix the edges of walkways and driveways and fix any cracks.  Remove anything that might make you trip as you walk through a door, such as a raised step or threshold.  Trim any bushes or trees on the path to your home.  Use bright outdoor lighting.  Clear any walking paths of anything that might make someone trip, such as rocks or tools.  Regularly check to see if handrails are loose or broken. Make sure that both sides of any steps have handrails.  Any raised decks and porches should have guardrails on the edges.  Have any leaves, snow, or ice cleared regularly.  Use sand or salt on walking paths during winter.  Clean up any spills in your garage right away. This includes oil or grease spills. What can I do in the bathroom?  Use night lights.  Install grab bars by the toilet and in the tub and shower. Do not use towel bars as grab bars.  Use non-skid mats or decals in the tub or shower.  If you need to sit down in the shower, use a plastic, non-slip stool.  Keep the floor dry. Clean up any water that spills on the floor as soon as it happens.  Remove soap buildup in the tub or shower regularly.  Attach bath mats securely with double-sided non-slip rug tape.  Do not have throw rugs  and other things on the floor that can make you trip. What can I do in the bedroom?  Use night lights.  Make sure that you have a light by your bed that is easy to reach.  Do not use any sheets or blankets that are too big for your bed. They should not hang down onto the floor.  Have a firm chair that has side arms. You can use this for support while you get dressed.  Do not have throw rugs and other things on the floor that can make you trip. What can I do in the kitchen?  Clean up any spills right away.  Avoid walking on wet floors.  Keep items that you use a lot in easy-to-reach places.  If you need to reach something above you, use a strong step stool that has a grab bar.  Keep electrical cords out of the way.  Do not use floor polish or wax that makes floors slippery. If you must use wax, use non-skid floor wax.  Do not have throw rugs and other things on the floor that can make you trip. What can I do with my stairs?  Do not leave any items on the stairs.  Make sure that there are handrails on both sides of the stairs and use them. Fix handrails that are broken or loose. Make sure that handrails are as long as the stairways.  Check any carpeting to make sure that it is firmly attached to the stairs. Fix any carpet that is loose or worn.  Avoid having throw rugs at the top or bottom of the stairs. If you do have throw rugs, attach them to the floor with carpet tape.  Make sure that you have a light switch at the top of the stairs and the bottom of the stairs. If you do not have them, ask someone to add them for you. What else can I do to help prevent falls?  Wear shoes that:  Do not have high heels.  Have rubber bottoms.  Are comfortable and fit you well.  Are closed at the toe. Do not wear sandals.  If you use a stepladder:  Make sure that it is fully opened. Do not climb a closed stepladder.  Make sure that both sides of the stepladder are locked into  place.  Ask someone to hold it for you, if possible.  Clearly mark and make sure that you can see:  Any grab bars or handrails.  First and last steps.  Where the edge of each step is.  Use tools that help you move around (mobility aids) if they are needed. These include:  Canes.  Walkers.  Scooters.  Crutches.  Turn on the lights when you go into a dark area. Replace any light bulbs as soon as they burn out.  Set up your furniture so you have a clear path. Avoid moving your furniture around.  If any of your floors are uneven, fix them.  If there are any pets around you, be aware of where they are.  Review your medicines with your doctor. Some medicines can make you feel dizzy. This can increase your chance of falling. Ask your doctor what other things that you can do to help prevent falls. This information is not intended to replace advice given to you by your health care provider. Make sure you discuss any questions you have with your health care provider. Document Released: 12/09/2008 Document Revised: 07/21/2015 Document Reviewed: 03/19/2014 Elsevier Interactive Patient Education  2017 [employer].

## 2018-10-23 NOTE — Progress Notes (Signed)
  This encounter was created in error - please disregard.appt changed to Sherrie Mustache NP

## 2018-10-30 ENCOUNTER — Ambulatory Visit: Payer: Medicare Other | Admitting: Internal Medicine

## 2018-11-06 ENCOUNTER — Ambulatory Visit: Payer: Medicare Other | Admitting: Internal Medicine

## 2018-11-13 ENCOUNTER — Encounter: Payer: Self-pay | Admitting: Orthopaedic Surgery

## 2018-11-13 ENCOUNTER — Encounter: Payer: Self-pay | Admitting: Internal Medicine

## 2018-11-13 ENCOUNTER — Ambulatory Visit (INDEPENDENT_AMBULATORY_CARE_PROVIDER_SITE_OTHER): Payer: Medicare Other | Admitting: Orthopaedic Surgery

## 2018-11-13 ENCOUNTER — Other Ambulatory Visit: Payer: Self-pay

## 2018-11-13 ENCOUNTER — Ambulatory Visit (INDEPENDENT_AMBULATORY_CARE_PROVIDER_SITE_OTHER): Payer: Medicare Other

## 2018-11-13 ENCOUNTER — Ambulatory Visit (INDEPENDENT_AMBULATORY_CARE_PROVIDER_SITE_OTHER): Payer: Medicare Other | Admitting: Internal Medicine

## 2018-11-13 VITALS — BP 130/70 | HR 73 | Temp 97.7°F | Ht 61.0 in | Wt 129.8 lb

## 2018-11-13 DIAGNOSIS — E034 Atrophy of thyroid (acquired): Secondary | ICD-10-CM | POA: Diagnosis not present

## 2018-11-13 DIAGNOSIS — M25552 Pain in left hip: Secondary | ICD-10-CM

## 2018-11-13 DIAGNOSIS — C50411 Malignant neoplasm of upper-outer quadrant of right female breast: Secondary | ICD-10-CM | POA: Diagnosis not present

## 2018-11-13 DIAGNOSIS — M48061 Spinal stenosis, lumbar region without neurogenic claudication: Secondary | ICD-10-CM

## 2018-11-13 DIAGNOSIS — D500 Iron deficiency anemia secondary to blood loss (chronic): Secondary | ICD-10-CM | POA: Diagnosis not present

## 2018-11-13 DIAGNOSIS — F419 Anxiety disorder, unspecified: Secondary | ICD-10-CM | POA: Diagnosis not present

## 2018-11-13 DIAGNOSIS — Z170 Estrogen receptor positive status [ER+]: Secondary | ICD-10-CM

## 2018-11-13 MED ORDER — METHOCARBAMOL 500 MG PO TABS: 500.0000 mg | ORAL_TABLET | Freq: Four times a day (QID) | ORAL | 1 refills | Status: DC | PRN

## 2018-11-13 MED ORDER — ACETAMINOPHEN-CODEINE #3 300-30 MG PO TABS: 1.0000 | ORAL_TABLET | Freq: Three times a day (TID) | ORAL | 0 refills | Status: DC | PRN

## 2018-11-13 MED ORDER — LORAZEPAM 0.5 MG PO TABS: ORAL_TABLET | ORAL | 1 refills | Status: DC

## 2018-11-13 MED ORDER — TAMOXIFEN CITRATE 20 MG PO TABS: 20.0000 mg | ORAL_TABLET | Freq: Every day | ORAL | 3 refills | Status: DC

## 2018-11-13 NOTE — Addendum Note (Signed)
Addended by: Ruthell Rummage A on: 11/13/2018 02:03 PM   Modules accepted: Orders

## 2018-11-13 NOTE — Progress Notes (Signed)
Location:  [facility]   Advanced Directives 11/13/2018  Does Patient Have a Medical Advance Directive? Yes  Type of Paramedic of [place];Living will;Out of facility DNR (pink MOST or yellow form)  Does patient want to make changes to medical advance directive? No - Patient declined  Copy of [place] in Chart? Yes - validated most recent copy scanned in chart (See row information)  Pre-existing out of facility DNR order (yellow form or pink MOST form) Yellow form placed in chart (order not valid for inpatient use)     Chief Complaint  Patient presents with  . Medical Management of Chronic Issues    Routine 14-month followup for history of breast neoplasm.    HPI: Patient is a 85 y.o. female seen today for medical management of chronic diseases.    She continues to to suffer from anxiety. Takes her ativan daily. Besides medication, walks a few miles daily, active in clubs and involved with her HOA. Also, has a service dog, Max, that helps reduce her anxiety. She believes when the election is over her anxiety will go away.   At times she is very fatigued. She takes her Synthroid as prescribed. Sleeping between 6-8 hours a night. Denies excessive daytime sleepiness. Denies any signs of blood loss.  She has a history of iron deficiency anemia. Requesting blood work to check for this disorder today.   Her lower back pain and left hip pain has not subsided. She is scheduled to see Dr. Ninfa Linden this afternoon. Uses naproxen as needed.   Mammogram scheduled 12/09/2018. She continues to be followed by Dr. Jana Hakim. Takes tamoxifen daily.   Requesting her bone density be done with mammogram.      Past Medical History:  Diagnosis Date  . Alopecia   . Breast cancer ([place])     Right , chemo, radiation  . DDD (degenerative disc disease)   . Heart palpitations   . History of diverticulitis   . History of diverticulitis of colon   .  Hypercholesteremia   . Hypothyroidism   . Insect bites   . Internal and external bleeding hemorrhoids 08/11/2015  . Personal history of chemotherapy 2012  . Personal history of radiation therapy 2012  . Scoliosis    L4 L5  . Seasonal allergies   . SUI (stress urinary incontinence, female)     Past Surgical History:  Procedure Laterality Date  . BLADDER SUSPENSION    . bowel rescetion    . BOWEL RESECTION    . BREAST BIOPSY Right 03/14/2010  . BREAST LUMPECTOMY  2012   right  . CATARACT EXTRACTION     left  . COLON SURGERY  20 YRS AGO   BOWEL RESECTION  . EYE SURGERY    . HEMORRHOID BANDING    . HERNIA REPAIR     [facility], Dr. Rise Patience  . JOINT REPLACEMENT    . PORT-A-CATH REMOVAL    . TONSILLECTOMY  2001   Diverticulitis  . TOTAL HIP ARTHROPLASTY Right 09/10/2014   Procedure: RIGHT TOTAL HIP ARTHROPLASTY ANTERIOR APPROACH;  Surgeon: Mcarthur Rossetti, MD;  Location: [facility];  Service: Orthopedics;  Laterality: Right;    Allergies  Allergen Reactions  . Amoxicillin Rash  . Diphenhydramine Rash    "It is the red dye in Benadryl that I''m allergic to"  . Naproxen Sodium Rash    "It is the blue dye in naproxen that I'm allergic to."  . Lipitor [Atorvastatin]   .  Meclizine Other (See Comments)    Worsening dizziness and blurred vision  . Amlodipine Other (See Comments)    Dizzy  . Cefdinir Other (See Comments)    Vertigo and headaches   . Codeine Other (See Comments)    unknown  . Ferrous Sulfate     "tears belly up"  . Methocarbamol Other (See Comments)    insomnia   . Prednisone Other (See Comments)    vertigo and headaches  . Skelaxin [Metaxalone] Other (See Comments)    Gastric upset.   . Tramadol Nausea Only  . Hctz [Hydrochlorothiazide] Itching    Outpatient Encounter Medications as of 11/13/2018  Medication Sig  . fish oil-omega-3 fatty acids 1000 MG capsule Take 1 g by mouth daily.   Marland Kitchen lidocaine (XYLOCAINE) 5 % ointment Apply 1 application topically  3 (three) times daily as needed. For skin pain  . LORazepam (ATIVAN) 0.5 MG tablet Take one tablet daily as needed  . Multiple Vitamin (MULTIVITAMIN) tablet Take 1 tablet by mouth daily.  . naproxen sodium (ANAPROX) 220 MG tablet Take 220 mg by mouth daily.   Marland Kitchen SYNTHROID 100 MCG tablet Take 1 tablet (100 mcg total) by mouth daily before breakfast.  . tamoxifen (NOLVADEX) 20 MG tablet Take 1 tablet (20 mg total) by mouth daily.   No facility-administered encounter medications on file as of 11/13/2018.     Review of Systems:  Review of Systems  Constitutional: Positive for fatigue. Negative for activity change and appetite change.  HENT: Negative for dental problem, hearing loss, sore throat and trouble swallowing.   Respiratory: Negative for cough, shortness of breath and wheezing.   Cardiovascular: Negative for chest pain, palpitations and leg swelling.  Gastrointestinal: Negative for abdominal pain, constipation, diarrhea and nausea.  Genitourinary: Negative for dysuria, frequency, hematuria and vaginal bleeding.  Musculoskeletal: Positive for back pain and myalgias.       Left hip pain  Skin:       Insect bites on bilateral lower legs  Neurological: Negative for dizziness, numbness and headaches.  Psychiatric/Behavioral: Positive for dysphoric mood. Negative for sleep disturbance. The patient is nervous/anxious.   All other systems reviewed and are negative.   Health Maintenance  Topic Date Due  . INFLUENZA VACCINE  09/27/2018  . TETANUS/TDAP  05/02/2022  . DEXA SCAN  Completed  . PNA vac Low Risk Adult  Completed    Physical Exam: Vitals:   11/13/18 1318  BP: 130/70  Pulse: 73  Temp: 97.7 F (36.5 C)  TempSrc: Oral  SpO2: 95%  Weight: 129 lb 12.8 oz (58.9 kg)  Height: 5\' 1"  (1.549 m)   Body mass index is 24.53 kg/m. Physical Exam Vitals signs reviewed.  Constitutional:      General: She is not in acute distress.    Appearance: Normal appearance. She is normal  weight.  HENT:     Head: Normocephalic.  Neck:     Musculoskeletal: Normal range of motion.  Cardiovascular:     Rate and Rhythm: Normal rate and regular rhythm.     Pulses: Normal pulses.     Heart sounds: Normal heart sounds. No murmur.  Pulmonary:     Effort: Pulmonary effort is normal. No respiratory distress.     Breath sounds: Normal breath sounds. No wheezing.  Abdominal:     General: Abdomen is flat. Bowel sounds are normal.     Palpations: Abdomen is soft.  Musculoskeletal: Normal range of motion.     Right lower leg:  No edema.     Left lower leg: No edema.  Skin:    General: Skin is warm and dry.     Capillary Refill: Capillary refill takes less than 2 seconds.     Comments: Small insect bites to bilateral lower legs. No redness, pain or purulent drainage noted.   Neurological:     General: No focal deficit present.     Mental Status: She is alert and oriented to person, place, and time. Mental status is at baseline.     Gait: Gait normal.  Psychiatric:        Mood and Affect: Mood normal.        Behavior: Behavior normal.        Thought Content: Thought content normal.        Judgment: Judgment normal.     Labs reviewed: Basic Metabolic Panel: Recent Labs    03/24/18 0910  NA 143  K 4.5  CL 103  CO2 32  GLUCOSE 92  BUN 18  CREATININE 0.79  CALCIUM 9.6  TSH 1.18   Liver Function Tests: Recent Labs    03/24/18 0910  AST 17  ALT 11  BILITOT 0.5  PROT 6.5   No results for input(s): LIPASE, AMYLASE in the last 8760 hours. No results for input(s): AMMONIA in the last 8760 hours. CBC: Recent Labs    03/24/18 0910  WBC 7.1  NEUTROABS 2,826  HGB 13.8  HCT 41.2  MCV 92.2  PLT 206   Lipid Panel: No results for input(s): CHOL, HDL, LDLCALC, TRIG, CHOLHDL, LDLDIRECT in the last 8760 hours. No results found for: HGBA1C  Procedures since last visit: No results found.  Assessment/Plan 1. Anxiety - increased at times due to politics and covid  - encourage healthy coping skills, like walking, being parts of committes and being social  - LORazepam (ATIVAN) 0.5 MG tablet; Take one tablet daily as needed  Dispense: 90 tablet; Refill: 1  2. Malignant neoplasm of upper-outer quadrant of right breast in female, estrogen receptor positive ([place]) - followed by Dr. Jana Hakim - continue tamoxifen tonight - tamoxifen (NOLVADEX) 20 MG tablet; Take 1 tablet (20 mg total) by mouth daily.  Dispense: 90 tablet; Refill: 3  3. Spinal stenosis of lumbar region without neurogenic claudication - ongoing, will be seen by Dr. Ninfa Linden today to r/o OA of hip - discussed heat and ice therapy  4. Iron deficiency anemia due to chronic blood loss - patient reports fatigue, in past has been monitored 1-2 times a year - complete blood count with differential/platelets- today - iron level- today - TIBC- today - Ferritiin panel- today  5. Hypothyroidism due to acquired atrophy of thyroid  - has been euthyroid - continue current synthroid dose daily    Labs/tests ordered:  Complete blood count with differential/platelets, iron, TIBC, ferritin panel- today Next appt:  Visit date not found

## 2018-11-13 NOTE — Progress Notes (Signed)
Office Visit Note   Patient: Alice [name]           Date of Birth: 11/13/1930           MRN: [id number] Visit Date: 11/13/2018              Requested by: [name], DO [place],  [place] [id number] PCP: [name], DO   Assessment & Plan: Visit Diagnoses:  1. Pain in left hip     Plan: I gave her reassurance that I do not see anything broken.  She is mobilizing well and so this is likely just a strain and contusion.  We will try some Robaxin and Tylenol 3 for her.  She can also use intermittent ice and heat.  All question concerns were answered and addressed.  Follow-up will be as needed.  Follow-Up Instructions: Return if symptoms worsen or fail to improve.   Orders:  Orders Placed This Encounter  Procedures  . XR HIP UNILAT W OR W/O PELVIS 1V LEFT   Meds ordered this encounter  Medications  . methocarbamol (ROBAXIN) 500 MG tablet    Sig: Take 1 tablet (500 mg total) by mouth every 6 (six) hours as needed for muscle spasms.    Dispense:  30 tablet    Refill:  1  . acetaminophen-codeine (TYLENOL #3) 300-30 MG tablet    Sig: Take 1-2 tablets by mouth every 8 (eight) hours as needed for moderate pain.    Dispense:  30 tablet    Refill:  0      Procedures: No procedures performed   Clinical Data: No additional findings.   Subjective: Chief Complaint  Patient presents with  . Left Hip - Pain  . Lower Back - Pain  The patient is 85 year old female well-known to me.  She comes in today with her dog to evaluate and treat low back and left hip pain after recent fall about 2 days ago.  She has known significant degenerative scoliosis and stenosis of her lumbar spine.  She is had a previous right total hip arthroplasty.  She has not had any left hip pain before.  Most of her pain on exam today she says is in her lower back.  She walks without assistive device.  She denies any significant groin pain.  She has been able to weight-bear as tolerated.   HPI  Review of Systems She currently denies any headache, chest pain, shortness of breath, fever, chills, nausea, vomiting  Objective: Vital Signs: There were no vitals taken for this visit.  Physical Exam She is alert and oriented x3 and in no acute distress Ortho Exam Examination of her left hip shows that it moves smoothly and fluidly and there is no pain in the groin and no pain when compressing her hip or stressing her left hip.  Most of her pain seems to be in the mid to lower lumbar spine to the left side. Specialty Comments:  No specialty comments available.  Imaging: Xr Hip Unilat W Or W/o Pelvis 1v Left  Result Date: 11/13/2018 An AP pelvis and lateral left hip shows no acute findings in light of the patient's recent fall.  There is a right total hip arthroplasty with no complicating features.  The left hip joint space is well-maintained.    PMFS History: Patient Active Problem List   Diagnosis Date Noted  . Atherosclerosis of aorta ([place]) 09/15/2018  . Iron deficiency anemia 08/12/2016  . Mild  anemia 07/06/2016  . Left torticollis 08/11/2015  . Hypothyroidism due to acquired atrophy of thyroid 08/11/2015  . Internal and external bleeding hemorrhoids 08/11/2015  . Osteoarthritis of right hip 09/10/2014  . Status post total replacement of right hip 09/10/2014  . Malignant neoplasm of upper-outer quadrant of right breast in female, estrogen receptor positive ([place]) 01/07/2013  . Arthralgia 12/08/2012  . Anxiety 02/12/2012  . Cat bite of finger 08/10/2011  . Cellulitis 08/10/2011   Past Medical History:  Diagnosis Date  . Alopecia   . Breast cancer ([place])     Right , chemo, radiation  . DDD (degenerative disc disease)   . Heart palpitations   . History of diverticulitis   . History of diverticulitis of colon   . Hypercholesteremia   . Hypothyroidism   . Insect bites   . Internal and external bleeding hemorrhoids 08/11/2015  . Personal history of chemotherapy  2012  . Personal history of radiation therapy 2012  . Scoliosis    L4 L5  . Seasonal allergies   . SUI (stress urinary incontinence, female)     Family History  Problem Relation Age of Onset  . Heart disease Mother   . Heart disease Father   . CVA Father 12  . Cancer Sister 65       lymphoma  . Asthma Son   . Asthma Son   . Asthma Son   . Skin cancer Brother 82       melanoma  . Colon cancer Other     Past Surgical History:  Procedure Laterality Date  . BLADDER SUSPENSION    . bowel rescetion    . BOWEL RESECTION    . BREAST BIOPSY Right 03/14/2010  . BREAST LUMPECTOMY  2012   right  . CATARACT EXTRACTION     left  . COLON SURGERY  20 YRS AGO   BOWEL RESECTION  . EYE SURGERY    . HEMORRHOID BANDING    . HERNIA REPAIR     [facility], Dr. Rise Patience  . JOINT REPLACEMENT    . PORT-A-CATH REMOVAL    . TONSILLECTOMY  2001   Diverticulitis  . TOTAL HIP ARTHROPLASTY Right 09/10/2014   Procedure: RIGHT TOTAL HIP ARTHROPLASTY ANTERIOR APPROACH;  Surgeon: Mcarthur Rossetti, MD;  Location: [facility];  Service: Orthopedics;  Laterality: Right;   Social History   Occupational History  . Occupation: Retired    Fish farm manager: RETIRED  Tobacco Use  . Smoking status: Former Smoker    Quit date: 08/09/1964    Years since quitting: 54.2  . Smokeless tobacco: Never Used  Substance and Sexual Activity  . Alcohol use: Yes    Comment: RARE  . Drug use: No  . Sexual activity: Not Currently

## 2018-11-13 NOTE — Patient Instructions (Signed)
[facility] for your bone density to be done with your mammogram.

## 2018-11-14 LAB — CBC WITH DIFFERENTIAL/PLATELET
Absolute Monocytes: 490 cells/uL (ref 200–950)
Basophils Absolute: 48 cells/uL (ref 0–200)
Basophils Relative: 0.7 %
Eosinophils Absolute: 173 cells/uL (ref 15–500)
Eosinophils Relative: 2.5 %
HCT: 40.5 % (ref 35.0–45.0)
Hemoglobin: 13.6 g/dL (ref 11.7–15.5)
Lymphs Abs: 3057 cells/uL (ref 850–3900)
MCH: 30.8 pg (ref 27.0–33.0)
MCHC: 33.6 g/dL (ref 32.0–36.0)
MCV: 91.8 fL (ref 80.0–100.0)
MPV: 10.7 fL (ref 7.5–12.5)
Monocytes Relative: 7.1 %
Neutro Abs: 3133 cells/uL (ref 1500–7800)
Neutrophils Relative %: 45.4 %
Platelets: 199 10*3/uL (ref 140–400)
RBC: 4.41 10*6/uL (ref 3.80–5.10)
RDW: 12.8 % (ref 11.0–15.0)
Total Lymphocyte: 44.3 %
WBC: 6.9 10*3/uL (ref 3.8–10.8)

## 2018-11-14 LAB — IRON,TIBC AND FERRITIN PANEL
%SAT: 19 % (calc) (ref 16–45)
Ferritin: 28 ng/mL (ref 16–288)
Iron: 69 ug/dL (ref 45–160)
TIBC: 355 mcg/dL (calc) (ref 250–450)

## 2018-12-09 ENCOUNTER — Other Ambulatory Visit: Payer: Self-pay

## 2018-12-09 ENCOUNTER — Ambulatory Visit
Admission: RE | Admit: 2018-12-09 | Discharge: 2018-12-09 | Disposition: A | Discharge status: Home / Self Care | Payer: Medicare Other | Source: Ambulatory Visit | Attending: Oncology | Admitting: Oncology

## 2018-12-09 DIAGNOSIS — R928 Other abnormal and inconclusive findings on diagnostic imaging of breast: Secondary | ICD-10-CM | POA: Diagnosis not present

## 2018-12-09 DIAGNOSIS — C50411 Malignant neoplasm of upper-outer quadrant of right female breast: Secondary | ICD-10-CM

## 2018-12-09 DIAGNOSIS — Z170 Estrogen receptor positive status [ER+]: Secondary | ICD-10-CM

## 2019-01-05 ENCOUNTER — Other Ambulatory Visit: Payer: Self-pay

## 2019-01-05 ENCOUNTER — Encounter: Payer: Self-pay | Admitting: Internal Medicine

## 2019-01-05 ENCOUNTER — Ambulatory Visit (INDEPENDENT_AMBULATORY_CARE_PROVIDER_SITE_OTHER): Payer: Medicare Other | Admitting: Internal Medicine

## 2019-01-05 VITALS — BP 165/86 | HR 96 | Temp 97.9°F | Resp 18 | Ht 61.0 in | Wt 132.6 lb

## 2019-01-05 DIAGNOSIS — H6121 Impacted cerumen, right ear: Secondary | ICD-10-CM | POA: Diagnosis not present

## 2019-01-05 DIAGNOSIS — F339 Major depressive disorder, recurrent, unspecified: Secondary | ICD-10-CM | POA: Diagnosis not present

## 2019-01-05 DIAGNOSIS — R030 Elevated blood-pressure reading, without diagnosis of hypertension: Secondary | ICD-10-CM | POA: Diagnosis not present

## 2019-01-05 NOTE — Progress Notes (Signed)
Location:  [facility] Provider:  Khalin Royce L. Mariea Clonts, D.O., C.M.D.  Code Status: DNR Goals of Care:  Advanced Directives 01/05/2019  Does Patient Have a Medical Advance Directive? Yes  Type of Advance Directive Out of facility DNR (pink MOST or yellow form);Healthcare Power of Attorney  Does patient want to make changes to medical advance directive? -  Copy of [place] in Chart? Yes - validated most recent copy scanned in chart (See row information)  Pre-existing out of facility DNR order (yellow form or pink MOST form) Yellow form placed in chart (order not valid for inpatient use)     Chief Complaint  Patient presents with  . Acute Visit    Right Ear Blockage    HPI: Patient is a 85 y.o. female seen today for acute visit for right ear blockage. Previously had blockage of left and it was irrigated with resolution.  She has been using debrox in both ears to no avail--no wax has come out and she is having trouble hearing when lying on her left side.   Reports being borderline depressed with covid and recent times pre-election when she was considering a move to [place].  Fortunately, this time has passed.  She's still a bit down, but does have friends she meets to walk with in the evenings and has wine or coffee or cheese and crackers with them.  Of course, she has Max, her service dog.  BP at home was 130/80.  High here after drama about being scheduled with provider that has fear of dogs and staff having fear of dogs.    Past Medical History:  Diagnosis Date  . Alopecia   . Breast cancer ([place])     Right , chemo, radiation  . DDD (degenerative disc disease)   . Heart palpitations   . History of diverticulitis   . History of diverticulitis of colon   . Hypercholesteremia   . Hypothyroidism   . Insect bites   . Internal and external bleeding hemorrhoids 08/11/2015  . Personal history of chemotherapy 2012  . Personal history of radiation therapy 2012  . Scoliosis     L4 L5  . Seasonal allergies   . SUI (stress urinary incontinence, female)     Past Surgical History:  Procedure Laterality Date  . BLADDER SUSPENSION    . bowel rescetion    . BOWEL RESECTION    . BREAST BIOPSY Right 03/14/2010  . BREAST LUMPECTOMY  2012   right  . CATARACT EXTRACTION     left  . COLON SURGERY  20 YRS AGO   BOWEL RESECTION  . EYE SURGERY    . HEMORRHOID BANDING    . HERNIA REPAIR     [facility], Dr. Rise Patience  . JOINT REPLACEMENT    . PORT-A-CATH REMOVAL    . TONSILLECTOMY  2001   Diverticulitis  . TOTAL HIP ARTHROPLASTY Right 09/10/2014   Procedure: RIGHT TOTAL HIP ARTHROPLASTY ANTERIOR APPROACH;  Surgeon: Mcarthur Rossetti, MD;  Location: [facility];  Service: Orthopedics;  Laterality: Right;    Allergies  Allergen Reactions  . Amoxicillin Rash  . Diphenhydramine Rash    "It is the red dye in Benadryl that I''m allergic to"  . Naproxen Sodium Rash    "It is the blue dye in naproxen that I'm allergic to."  . Lipitor [Atorvastatin]   . Meclizine Other (See Comments)    Worsening dizziness and blurred vision  . Amlodipine Other (See Comments)  Dizzy  . Cefdinir Other (See Comments)    Vertigo and headaches   . Codeine Other (See Comments)    unknown  . Ferrous Sulfate     "tears belly up"  . Methocarbamol Other (See Comments)    insomnia   . Prednisone Other (See Comments)    vertigo and headaches  . Skelaxin [Metaxalone] Other (See Comments)    Gastric upset.   . Tramadol Nausea Only  . Hctz [Hydrochlorothiazide] Itching    Outpatient Encounter Medications as of 01/05/2019  Medication Sig  . Acetaminophen (TYLENOL) 325 MG CAPS Take 325 mg by mouth daily.  . fish oil-omega-3 fatty acids 1000 MG capsule Take 1 g by mouth daily.   Marland Kitchen lidocaine (XYLOCAINE) 5 % ointment Apply 1 application topically 3 (three) times daily as needed. For skin pain  . LORazepam (ATIVAN) 0.5 MG tablet Take one tablet daily as needed  . Multiple Vitamin  (MULTIVITAMIN) tablet Take 1 tablet by mouth daily.  Marland Kitchen SYNTHROID 100 MCG tablet Take 1 tablet (100 mcg total) by mouth daily before breakfast.  . tamoxifen (NOLVADEX) 20 MG tablet Take 1 tablet (20 mg total) by mouth daily.  . [DISCONTINUED] acetaminophen-codeine (TYLENOL #3) 300-30 MG tablet Take 1-2 tablets by mouth every 8 (eight) hours as needed for moderate pain.  . [DISCONTINUED] methocarbamol (ROBAXIN) 500 MG tablet Take 1 tablet (500 mg total) by mouth every 6 (six) hours as needed for muscle spasms.  . [DISCONTINUED] naproxen sodium (ANAPROX) 220 MG tablet Take 220 mg by mouth daily.    No facility-administered encounter medications on file as of 01/05/2019.     Review of Systems:  Review of Systems  Constitutional: Positive for malaise/fatigue. Negative for chills and fever.  HENT: Positive for hearing loss.   Eyes: Negative for blurred vision.  Respiratory: Negative for shortness of breath.   Cardiovascular: Negative for chest pain, palpitations and leg swelling.  Genitourinary: Negative for dysuria.  Musculoskeletal: Negative for falls and joint pain.  Skin: Negative for itching and rash.  Neurological: Negative for dizziness and loss of consciousness.  Psychiatric/Behavioral: Positive for depression. Negative for memory loss. The patient is nervous/anxious. The patient does not have insomnia.     Health Maintenance  Topic Date Due  . TETANUS/TDAP  05/02/2022  . INFLUENZA VACCINE  Completed  . DEXA SCAN  Completed  . PNA vac Low Risk Adult  Completed    Physical Exam: Vitals:   01/05/19 1504  BP: (!) 165/86  Pulse: 96  Resp: 18  Temp: 97.9 F (36.6 C)  TempSrc: Oral  SpO2: 97%  Weight: 132 lb 9.6 oz (60.1 kg)  Height: 5\' 1"  (1.549 m)   Body mass index is 25.05 kg/m. Physical Exam Vitals signs reviewed.  Constitutional:      General: She is not in acute distress.    Appearance: Normal appearance. She is not ill-appearing or toxic-appearing.  HENT:      Head: Normocephalic and atraumatic.     Right Ear: There is impacted cerumen.     Left Ear: Tympanic membrane, ear canal and external ear normal.  Cardiovascular:     Rate and Rhythm: Normal rate and regular rhythm.  Pulmonary:     Effort: Pulmonary effort is normal.     Breath sounds: Normal breath sounds.  Skin:    General: Skin is warm and dry.  Neurological:     General: No focal deficit present.     Mental Status: She is alert and oriented to  person, place, and time.     Labs reviewed: Basic Metabolic Panel: Recent Labs    03/24/18 0910  NA 143  K 4.5  CL 103  CO2 32  GLUCOSE 92  BUN 18  CREATININE 0.79  CALCIUM 9.6  TSH 1.18   Liver Function Tests: Recent Labs    03/24/18 0910  AST 17  ALT 11  BILITOT 0.5  PROT 6.5   No results for input(s): LIPASE, AMYLASE in the last 8760 hours. No results for input(s): AMMONIA in the last 8760 hours. CBC: Recent Labs    03/24/18 0910 11/13/18 1449  WBC 7.1 6.9  NEUTROABS 2,826 3,133  HGB 13.8 13.6  HCT 41.2 40.5  MCV 92.2 91.8  PLT 206 199   Lipid Panel: No results for input(s): CHOL, HDL, LDLCALC, TRIG, CHOLHDL, LDLDIRECT in the last 8760 hours. No results found for: HGBA1C  Procedures since last visit: Mm Diag Breast Tomo Bilateral  Result Date: 12/09/2018 CLINICAL DATA:  Right lumpectomy. The patient is being followed for a probably benign left breast asymmetry. EXAM: DIGITAL DIAGNOSTIC BILATERAL MAMMOGRAM WITH CAD AND TOMO COMPARISON:  Previous exam(s). ACR Breast Density Category b: There are scattered areas of fibroglandular density. FINDINGS: The right lumpectomy site is stable. The asymmetry in the superior left breast on the MLO view is stable for greater than 2 years. No other suspicious findings. Mammographic images were processed with CAD. IMPRESSION: No mammographic evidence of malignancy. RECOMMENDATION: Annual screening mammography. I have discussed the findings and recommendations with the  patient. If applicable, a reminder letter will be sent to the patient regarding the next appointment. BI-RADS CATEGORY  2: Benign. Electronically Signed   By: Dorise Bullion III M.D   On: 12/09/2018 11:24    Assessment/Plan 1. Hearing loss of right ear due to cerumen impaction - does have cerumen impaction so right ear was irrigated with warm water and peroxide - Ear Lavage  2. Depression, recurrent ([place]) -associated with covid, pre-election times and some chronic baseline anxiety and depression, but does not want medications  3. White coat syndrome without hypertension -bp at goal normally at home, but upset today b/c other providers would not see her due to dog so I added her to my schedule  Labs/tests ordered:  Ear lavage right Next appt:  prn  Findley Vi L. Merideth Bosque, D.O. [facility] [address], [place] [address] Cell Phone (Mon-Fri 8am-5pm):  [phone] On Call:  [phone] & follow prompts after 5pm & weekends Office Phone:  [phone] Office Fax:  [phone]

## 2019-01-10 ENCOUNTER — Other Ambulatory Visit: Payer: Self-pay | Admitting: Internal Medicine

## 2019-01-10 DIAGNOSIS — E034 Atrophy of thyroid (acquired): Secondary | ICD-10-CM

## 2019-01-13 ENCOUNTER — Ambulatory Visit: Payer: Medicare Other | Admitting: Orthopaedic Surgery

## 2019-01-15 ENCOUNTER — Other Ambulatory Visit: Payer: Self-pay

## 2019-01-15 ENCOUNTER — Encounter: Payer: Self-pay | Admitting: Orthopaedic Surgery

## 2019-01-15 ENCOUNTER — Ambulatory Visit (INDEPENDENT_AMBULATORY_CARE_PROVIDER_SITE_OTHER): Payer: Medicare Other | Admitting: Orthopaedic Surgery

## 2019-01-15 VITALS — Ht 61.0 in | Wt 132.0 lb

## 2019-01-15 DIAGNOSIS — M7062 Trochanteric bursitis, left hip: Secondary | ICD-10-CM

## 2019-01-15 MED ORDER — METHYLPREDNISOLONE ACETATE 40 MG/ML IJ SUSP
40.0000 mg | INTRAMUSCULAR | Status: AC | PRN
  Administered 2019-01-15: 16:00:00 40 mg via INTRA_ARTICULAR

## 2019-01-15 MED ORDER — LIDOCAINE HCL 1 % IJ SOLN
3.0000 mL | INTRAMUSCULAR | Status: AC | PRN
  Administered 2019-01-15: 3 mL

## 2019-01-15 NOTE — Progress Notes (Signed)
--------------------------------------+++++----------------------------------------------------------------------   Office Visit Note   Patient: Alice [name]           Date of Birth: August 04, 1930           MRN: [id number] Visit Date: 01/15/2019              Requested by: [name], DO [place],  [place] [address] PCP: [name], DO   Assessment & Plan: Visit Diagnoses:  1. Trochanteric bursitis, left hip     Plan: I did recommend a steroid injection of the trochanteric area and she agreed with this.  Given the fact that she has significant complaints about her pain and mobility I do feel that she would benefit from outpatient physical therapy for various modalities that can help decrease her hip pain and improve her strength overall.  All question concerns were answered and addressed.  She is definitely in favor of outpatient physical therapy.  She did tolerate the injection well.  Follow-Up Instructions: Return in about 6 weeks (around 02/26/2019).   Orders:  Orders Placed This Encounter  Procedures  . Large Joint Inj: L greater trochanter   No orders of the defined types were placed in this encounter.     Procedures: Large Joint Inj: L greater trochanter on 01/15/2019 4:27 PM Indications: pain and diagnostic evaluation Details: 22 G 1.5 in needle, lateral approach  Arthrogram: No  Medications: 3 mL lidocaine 1 %; 40 mg methylPREDNISolone acetate 40 MG/ML Outcome: tolerated well, no immediate complications Procedure, treatment alternatives, risks and benefits explained, specific risks discussed. Consent was given by the patient. Immediately prior to procedure a time out was called to verify the correct patient, procedure, equipment, support staff and site/side marked as required. Patient was prepped and draped in the usual sterile fashion.       Clinical Data: No additional findings.   Subjective: Chief Complaint  Patient presents with  .  Left Hip - Pain  The patient is well-known to [place].  She is a very active 85 year old female who appears younger than her stated age.  She does have a history of a right total hip arthroplasty.  She also has severe scoliosis of her lumbar spine.  She has been having left hip pain since a mechanical fall a few months ago.  X-rays of her left hip did not show any type of fracture.  Her hip joint space is well-maintained.  She still has a lot of pain over the trochanteric area and she is frustrated by the pain she is having.  She feels like she is not making good progress in her mobility and the pain is frustrating to her as well.  She cannot take any type of pain medications or muscle relaxants because they do cause too much lightheadedness.  HPI  Review of Systems She currently denies any headache, chest pain, shortness of breath, fever, chills, nausea, vomiting  Objective: Vital Signs: Ht 5\' 1"  (1.549 m)   Wt 132 lb (59.9 kg)   BMI 24.94 kg/m   Physical Exam She is alert and orient x3 and in no acute distress Ortho Exam Examination of her left hip shows that it moves smoothly and fluidly with no pain in the groin at all.  She is very tender over the trochanteric area of the hip. Specialty Comments:  No specialty comments available.  Imaging: No results found.   PMFS History: Patient Active Problem List   Diagnosis Date Noted  . Atherosclerosis of  aorta ([place]) 09/15/2018  . Iron deficiency anemia 08/12/2016  . Mild anemia 07/06/2016  . Left torticollis 08/11/2015  . Hypothyroidism due to acquired atrophy of thyroid 08/11/2015  . Internal and external bleeding hemorrhoids 08/11/2015  . Osteoarthritis of right hip 09/10/2014  . Status post total replacement of right hip 09/10/2014  . Malignant neoplasm of upper-outer quadrant of right breast in female, estrogen receptor positive ([place]) 01/07/2013  . Arthralgia 12/08/2012  . Anxiety 02/12/2012  . Cat bite of finger 08/10/2011  .  Cellulitis 08/10/2011   Past Medical History:  Diagnosis Date  . Alopecia   . Breast cancer ([place])     Right , chemo, radiation  . DDD (degenerative disc disease)   . Heart palpitations   . History of diverticulitis   . History of diverticulitis of colon   . Hypercholesteremia   . Hypothyroidism   . Insect bites   . Internal and external bleeding hemorrhoids 08/11/2015  . Personal history of chemotherapy 2012  . Personal history of radiation therapy 2012  . Scoliosis    L4 L5  . Seasonal allergies   . SUI (stress urinary incontinence, female)     Family History  Problem Relation Age of Onset  . Heart disease Mother   . Heart disease Father   . CVA Father 60  . Cancer Sister 33       lymphoma  . Asthma Son   . Asthma Son   . Asthma Son   . Skin cancer Brother 5       melanoma  . Colon cancer Other     Past Surgical History:  Procedure Laterality Date  . BLADDER SUSPENSION    . bowel rescetion    . BOWEL RESECTION    . BREAST BIOPSY Right 03/14/2010  . BREAST LUMPECTOMY  2012   right  . CATARACT EXTRACTION     left  . COLON SURGERY  20 YRS AGO   BOWEL RESECTION  . EYE SURGERY    . HEMORRHOID BANDING    . HERNIA REPAIR     [facility], Dr. Rise Patience  . JOINT REPLACEMENT    . PORT-A-CATH REMOVAL    . TONSILLECTOMY  2001   Diverticulitis  . TOTAL HIP ARTHROPLASTY Right 09/10/2014   Procedure: RIGHT TOTAL HIP ARTHROPLASTY ANTERIOR APPROACH;  Surgeon: Mcarthur Rossetti, MD;  Location: [facility];  Service: Orthopedics;  Laterality: Right;   Social History   Occupational History  . Occupation: Retired    Fish farm manager: RETIRED  Tobacco Use  . Smoking status: Former Smoker    Quit date: 08/09/1964    Years since quitting: 54.4  . Smokeless tobacco: Never Used  Substance and Sexual Activity  . Alcohol use: Yes    Comment: RARE  . Drug use: No  . Sexual activity: Not Currently

## 2019-01-16 ENCOUNTER — Other Ambulatory Visit: Payer: Self-pay

## 2019-01-16 DIAGNOSIS — M25552 Pain in left hip: Secondary | ICD-10-CM

## 2019-01-20 ENCOUNTER — Ambulatory Visit: Payer: Medicare Other | Admitting: Physical Therapy

## 2019-02-09 ENCOUNTER — Ambulatory Visit: Payer: Medicare Other | Admitting: Physical Therapy

## 2019-02-26 ENCOUNTER — Ambulatory Visit (INDEPENDENT_AMBULATORY_CARE_PROVIDER_SITE_OTHER): Payer: Medicare Other | Admitting: Orthopaedic Surgery

## 2019-02-26 ENCOUNTER — Ambulatory Visit (INDEPENDENT_AMBULATORY_CARE_PROVIDER_SITE_OTHER): Payer: Medicare Other

## 2019-02-26 ENCOUNTER — Other Ambulatory Visit: Payer: Self-pay

## 2019-02-26 ENCOUNTER — Encounter: Payer: Self-pay | Admitting: Orthopaedic Surgery

## 2019-02-26 DIAGNOSIS — M7062 Trochanteric bursitis, left hip: Secondary | ICD-10-CM

## 2019-02-26 DIAGNOSIS — M25562 Pain in left knee: Secondary | ICD-10-CM

## 2019-02-26 NOTE — Progress Notes (Signed)
The patient comes in today after having a trochanteric injection around her left hip.  She is 85 year old and very active.  She is well-known to me.  She said the injection did not really help much for her.  She is also been having knee pain but she points to the anterior aspect of her knee as a source of her knee pain.  On exam she has a little bit of patellar tendinitis on exam with good range of motion of the left knee with no effusion and a ligamentously stable knee.  There is no significant patellofemoral crepitation and no joint line tenderness.  2 views of the knee show no acute findings with a well-maintained joint space.  I did go over hip x-rays with her again to show her a normal hip on the left hip.  Only thing I recommend for her with the stretching exercises and activity modification as well as topical anti-inflammatories.  All question concerns were answered addressed.  Follow-up as otherwise as needed.

## 2019-02-26 NOTE — Progress Notes (Signed)
Left

## 2019-04-09 ENCOUNTER — Ambulatory Visit (INDEPENDENT_AMBULATORY_CARE_PROVIDER_SITE_OTHER): Payer: Medicare Other

## 2019-04-09 ENCOUNTER — Encounter: Payer: Self-pay | Admitting: Orthopaedic Surgery

## 2019-04-09 ENCOUNTER — Ambulatory Visit: Payer: Self-pay

## 2019-04-09 ENCOUNTER — Other Ambulatory Visit: Payer: Self-pay

## 2019-04-09 ENCOUNTER — Ambulatory Visit (INDEPENDENT_AMBULATORY_CARE_PROVIDER_SITE_OTHER): Payer: Medicare Other | Admitting: Orthopaedic Surgery

## 2019-04-09 DIAGNOSIS — M25552 Pain in left hip: Secondary | ICD-10-CM | POA: Diagnosis not present

## 2019-04-09 DIAGNOSIS — M25562 Pain in left knee: Secondary | ICD-10-CM

## 2019-04-09 DIAGNOSIS — M25561 Pain in right knee: Secondary | ICD-10-CM

## 2019-04-09 NOTE — Progress Notes (Signed)
Office Visit Note   Patient: Alice [name]           Date of Birth: March 12, 1930           MRN: [id number] Visit Date: 04/09/2019              Requested by: [name], DO [place],  [place] [address] PCP: [name], DO   Assessment & Plan: Visit Diagnoses:  1. Acute pain of left knee   2. Acute pain of right knee   3. Pain in left hip     Plan: I reviewed all of her x-rays with her.  She looks good overall and seems to just be dealing with some contusions as it relates to her fall.  I gave her reassurance that the x-rays look good and she seems to doing well.  I want her to be careful with her gait.  All question concerns were answered and addressed.  Follow-up will be as needed.  Follow-Up Instructions: Return if symptoms worsen or fail to improve.   Orders:  Orders Placed This Encounter  Procedures  . XR Knee 1-2 Views Left  . XR Knee 1-2 Views Right  . XR HIP UNILAT W OR W/O PELVIS 1V LEFT   No orders of the defined types were placed in this encounter.     Procedures: No procedures performed   Clinical Data: No additional findings.   Subjective: Chief Complaint  Patient presents with  . Left Hip - Injury  . Left Knee - Injury  . Right Knee - Injury  Patient is a 86 year old well-known to [place].  She had a mechanical fall yesterday when she tripped going from one room to the next. This was an accidental fall wearing her slippers and tripping over something in her house.  She reports bilateral knee pain and left hip pain.  She says she fell on her knees.  She states that she had more difficulty ambulating yesterday.  She appears to be ambulating more normal today.  HPI  Review of Systems She currently denies any headache, chest pain, shortness of breath, fever, chills, nausea, vomiting  Objective: Vital Signs: There were no vitals taken for this visit.  Physical Exam She is alert and orient x3 and in no acute distress Ortho  Exam Examination of both her knees shows no effusion.  Both knees are ligamentously stable with full range of motion.  There is no bruising.  Her left hip moves normally.  There is only some pain over the trochanteric area but otherwise normal-appearing exam.  The right hip moves normally as well. Specialty Comments:  No specialty comments available.  Imaging: XR HIP UNILAT W OR W/O PELVIS 1V LEFT  Result Date: 04/09/2019 A low AP pelvis and lateral left hip shows no acute findings.  The patient had a mechanical fall yesterday.  The left hip appears normal with no evidence of fracture.  The right total hip arthroplasty appears normal.  There is no fracture lines in the pelvis seen.  XR Knee 1-2 Views Left  Result Date: 04/09/2019 2 views of the left knee show no acute findings.  There is no evidence of fracture.  The joint space is well-maintained.    PMFS History: Patient Active Problem List   Diagnosis Date Noted  . Atherosclerosis of aorta ([place]) 09/15/2018  . Iron deficiency anemia 08/12/2016  . Mild anemia 07/06/2016  . Left torticollis 08/11/2015  . Hypothyroidism due to acquired atrophy  of thyroid 08/11/2015  . Internal and external bleeding hemorrhoids 08/11/2015  . Osteoarthritis of right hip 09/10/2014  . Status post total replacement of right hip 09/10/2014  . Malignant neoplasm of upper-outer quadrant of right breast in female, estrogen receptor positive ([place]) 01/07/2013  . Arthralgia 12/08/2012  . Anxiety 02/12/2012  . Cat bite of finger 08/10/2011  . Cellulitis 08/10/2011   Past Medical History:  Diagnosis Date  . Alopecia   . Breast cancer ([place])     Right , chemo, radiation  . DDD (degenerative disc disease)   . Heart palpitations   . History of diverticulitis   . History of diverticulitis of colon   . Hypercholesteremia   . Hypothyroidism   . Insect bites   . Internal and external bleeding hemorrhoids 08/11/2015  . Personal history of chemotherapy 2012  .  Personal history of radiation therapy 2012  . Scoliosis    L4 L5  . Seasonal allergies   . SUI (stress urinary incontinence, female)     Family History  Problem Relation Age of Onset  . Heart disease Mother   . Heart disease Father   . CVA Father 63  . Cancer Sister 56       lymphoma  . Asthma Son   . Asthma Son   . Asthma Son   . Skin cancer Brother 59       melanoma  . Colon cancer Other     Past Surgical History:  Procedure Laterality Date  . BLADDER SUSPENSION    . bowel rescetion    . BOWEL RESECTION    . BREAST BIOPSY Right 03/14/2010  . BREAST LUMPECTOMY  2012   right  . CATARACT EXTRACTION     left  . COLON SURGERY  20 YRS AGO   BOWEL RESECTION  . EYE SURGERY    . HEMORRHOID BANDING    . HERNIA REPAIR     [facility], Dr. Rise Patience  . JOINT REPLACEMENT    . PORT-A-CATH REMOVAL    . TONSILLECTOMY  2001   Diverticulitis  . TOTAL HIP ARTHROPLASTY Right 09/10/2014   Procedure: RIGHT TOTAL HIP ARTHROPLASTY ANTERIOR APPROACH;  Surgeon: Mcarthur Rossetti, MD;  Location: [facility];  Service: Orthopedics;  Laterality: Right;   Social History   Occupational History  . Occupation: Retired    Fish farm manager: RETIRED  Tobacco Use  . Smoking status: Former Smoker    Quit date: 08/09/1964    Years since quitting: 54.7  . Smokeless tobacco: Never Used  Substance and Sexual Activity  . Alcohol use: Yes    Comment: RARE  . Drug use: No  . Sexual activity: Not Currently

## 2019-06-24 ENCOUNTER — Inpatient Hospital Stay: Payer: Medicare Other | Attending: Oncology

## 2019-06-24 ENCOUNTER — Inpatient Hospital Stay (HOSPITAL_BASED_OUTPATIENT_CLINIC_OR_DEPARTMENT_OTHER): Payer: Medicare Other | Admitting: Oncology

## 2019-06-24 ENCOUNTER — Other Ambulatory Visit: Payer: Self-pay

## 2019-06-24 VITALS — BP 150/80 | HR 75 | Temp 98.9°F | Resp 18 | Ht 61.0 in | Wt 131.5 lb

## 2019-06-24 DIAGNOSIS — Z170 Estrogen receptor positive status [ER+]: Secondary | ICD-10-CM | POA: Diagnosis not present

## 2019-06-24 DIAGNOSIS — Z87891 Personal history of nicotine dependence: Secondary | ICD-10-CM | POA: Diagnosis not present

## 2019-06-24 DIAGNOSIS — Z79810 Long term (current) use of selective estrogen receptor modulators (SERMs): Secondary | ICD-10-CM | POA: Insufficient documentation

## 2019-06-24 DIAGNOSIS — N951 Menopausal and female climacteric states: Secondary | ICD-10-CM | POA: Diagnosis not present

## 2019-06-24 DIAGNOSIS — D509 Iron deficiency anemia, unspecified: Secondary | ICD-10-CM | POA: Insufficient documentation

## 2019-06-24 DIAGNOSIS — Z9221 Personal history of antineoplastic chemotherapy: Secondary | ICD-10-CM | POA: Insufficient documentation

## 2019-06-24 DIAGNOSIS — Z79899 Other long term (current) drug therapy: Secondary | ICD-10-CM | POA: Insufficient documentation

## 2019-06-24 DIAGNOSIS — Z923 Personal history of irradiation: Secondary | ICD-10-CM | POA: Insufficient documentation

## 2019-06-24 DIAGNOSIS — C50411 Malignant neoplasm of upper-outer quadrant of right female breast: Secondary | ICD-10-CM

## 2019-06-24 DIAGNOSIS — Z800 Family history of malignant neoplasm of digestive organs: Secondary | ICD-10-CM | POA: Insufficient documentation

## 2019-06-24 DIAGNOSIS — Z8249 Family history of ischemic heart disease and other diseases of the circulatory system: Secondary | ICD-10-CM | POA: Diagnosis not present

## 2019-06-24 DIAGNOSIS — Z807 Family history of other malignant neoplasms of lymphoid, hematopoietic and related tissues: Secondary | ICD-10-CM | POA: Insufficient documentation

## 2019-06-24 DIAGNOSIS — Z808 Family history of malignant neoplasm of other organs or systems: Secondary | ICD-10-CM | POA: Insufficient documentation

## 2019-06-24 DIAGNOSIS — E039 Hypothyroidism, unspecified: Secondary | ICD-10-CM | POA: Diagnosis not present

## 2019-06-24 DIAGNOSIS — E7800 Pure hypercholesterolemia, unspecified: Secondary | ICD-10-CM | POA: Insufficient documentation

## 2019-06-24 LAB — COMPREHENSIVE METABOLIC PANEL
ALT: 17 U/L (ref 0–44)
AST: 21 U/L (ref 15–41)
Albumin: 3.5 g/dL (ref 3.5–5.0)
Alkaline Phosphatase: 34 U/L — ABNORMAL LOW (ref 38–126)
Anion gap: 7 (ref 5–15)
BUN: 22 mg/dL (ref 8–23)
CO2: 29 mmol/L (ref 22–32)
Calcium: 9 mg/dL (ref 8.9–10.3)
Chloride: 107 mmol/L (ref 98–111)
Creatinine, Ser: 0.92 mg/dL (ref 0.44–1.00)
GFR calc Af Amer: >60 mL/min (ref >60)
GFR calc non Af Amer: 56 mL/min — ABNORMAL LOW (ref >60)
Glucose, Bld: 123 mg/dL — ABNORMAL HIGH (ref 70–99)
Potassium: 4.1 mmol/L (ref 3.5–5.1)
Sodium: 143 mmol/L (ref 135–145)
Total Bilirubin: 0.3 mg/dL (ref 0.3–1.2)
Total Protein: 6.1 g/dL — ABNORMAL LOW (ref 6.5–8.1)

## 2019-06-24 LAB — CBC WITH DIFFERENTIAL/PLATELET
Abs Immature Granulocytes: 0.01 10*3/uL (ref 0.00–0.07)
Basophils Absolute: 0.1 10*3/uL (ref 0.0–0.1)
Basophils Relative: 1 %
Eosinophils Absolute: 0.2 10*3/uL (ref 0.0–0.5)
Eosinophils Relative: 3 %
HCT: 40.4 % (ref 36.0–46.0)
Hemoglobin: 13.2 g/dL (ref 12.0–15.0)
Immature Granulocytes: 0 %
Lymphocytes Relative: 42 %
Lymphs Abs: 3 10*3/uL (ref 0.7–4.0)
MCH: 31.2 pg (ref 26.0–34.0)
MCHC: 32.7 g/dL (ref 30.0–36.0)
MCV: 95.5 fL (ref 80.0–100.0)
Monocytes Absolute: 0.5 10*3/uL (ref 0.1–1.0)
Monocytes Relative: 6 %
Neutro Abs: 3.5 10*3/uL (ref 1.7–7.7)
Neutrophils Relative %: 48 %
Platelets: 200 10*3/uL (ref 150–400)
RBC: 4.23 MIL/uL (ref 3.87–5.11)
RDW: 13.7 % (ref 11.5–15.5)
WBC: 7.3 10*3/uL (ref 4.0–10.5)
nRBC: 0 % (ref 0.0–0.2)

## 2019-06-24 LAB — FERRITIN: Ferritin: 41 ng/mL (ref 11–307)

## 2019-06-24 NOTE — Progress Notes (Signed)
ID: Alice [name]   DOB: [birth year]  MR#: [id number]  CSN#:[id number]   Patient Care Team: [name], DO as PCP - General (Geriatric Medicine) Mcarthur Rossetti, MD as Consulting Physician (Orthopedic Surgery) Vir Whetstine, Virgie Dad, MD as Consulting Physician (Oncology) OTHER: Silvano Rusk, MD, [name] NP   CHIEF COMPLAINT:  Right Breast Cancer, history of iron deficiency  CURRENT THERAPY: Tamoxifen    INTERVAL HISTORY: Alice [name] is here today for follow up of her breast cancer accompanied by her canine companion Max.   She continues on tamoxifen, with good tolerance.  Hot flashes and vaginal dryness are noted issues for her.  She obtains the drug at a good price..   Since her last visit, she underwent bilateral diagnostic mammography with tomography at [facility] on 12/09/2018 showing: breast density category B; no evidence of malignancy in either breast.  She is followed by Dr. Ninfa Linden in orthopedics for a history of falls.  The most recent one was about a week ago and she has a little scar on her right cheekbone to show for it.  She tells me she was walking down the yard with a ceramic pot containing implant and when she fell she wanted to make sure she did not break the pot or break her nose so she turned her head.  She does not have any unusual [name] in that area she says.    She underwent lumbar spine x-ray on 08/27/2018 showing severe degenerative scoliosis.  Her most recent left hip x-ray was performed after a fall on 04/09/2019 showing no acute findings. Bilateral knee x-rays were also obtained that day and were also negative for acute findings.   REVIEW OF SYSTEMS: Keilee walks with the dog at least a half an hour at least twice daily.  She is otherwise entirely independent.  She received both her Pfizer vaccine doses with no complications.  A detailed review of systems today was otherwise entirely stable   HISTORY OF PRESENT ILLNESS: From the original  intake note:  Alice [name] noted a mass in her right breast late December of 2011.  She brought this to Dr. Matthias Hughs attention and he set her up for mammography at [facility].  This showed a large spiculated mass in the posterior upper outer quadrant of the right breast which was palpable.  By ultrasound it measured 3.6 cm.  It was hypoechoic with internal blood flow.  Biopsy was suggested and performed January 17th, 2012.  The pathology from that biopsy ([id number]) showed an invasive ductal carcinoma, Grade 3, ER 95% and PR 47% positive with an MIB-1 of 50%, no HER2 amplification with a CISH ratio of 1.63.    With this information, the patient was referred to Dr. Barry Dienes, and bilateral breast MRIs were obtained March 20, 2010.  This showed in the posterior 3rd of the outer right breast a 3.8 cm mass with a clip artifact and in addition in the axillary tail of the right breast, a 9 mm rounded mildly irregular mass, corresponding to an abnormal appearing lymph node seen at mammography.  There were no other areas suspicious for enhancement.    Her subsequent history is as summarized above.   PAST MEDICAL HISTORY: Past Medical History:  Diagnosis Date  . Alopecia   . Breast cancer ([place])     Right , chemo, radiation  . DDD (degenerative disc disease)   . Heart palpitations   . History of diverticulitis   . History of  diverticulitis of colon   . Hypercholesteremia   . Hypothyroidism   . Insect bites   . Internal and external bleeding hemorrhoids 08/11/2015  . Personal history of chemotherapy 2012  . Personal history of radiation therapy 2012  . Scoliosis    L4 L5  . Seasonal allergies   . SUI (stress urinary incontinence, female)   History of palpitations   PAST SURGICAL HISTORY: Past Surgical History:  Procedure Laterality Date  . BLADDER SUSPENSION    . bowel rescetion    . BOWEL RESECTION    . BREAST BIOPSY Right 03/14/2010  . BREAST LUMPECTOMY  2012    right  . CATARACT EXTRACTION     left  . COLON SURGERY  20 YRS AGO   BOWEL RESECTION  . EYE SURGERY    . HEMORRHOID BANDING    . HERNIA REPAIR     [facility], Dr. Rise Patience  . JOINT REPLACEMENT    . PORT-A-CATH REMOVAL    . TONSILLECTOMY  2001   Diverticulitis  . TOTAL HIP ARTHROPLASTY Right 09/10/2014   Procedure: RIGHT TOTAL HIP ARTHROPLASTY ANTERIOR APPROACH;  Surgeon: Mcarthur Rossetti, MD;  Location: [facility];  Service: Orthopedics;  Laterality: Right;  History of sling procedure.   FAMILY HISTORY Family History  Problem Relation Age of Onset  . Heart disease Mother   . Heart disease Father   . CVA Father 61  . Cancer Sister 20       lymphoma  . Asthma Son   . Asthma Son   . Asthma Son   . Skin cancer Brother 67       melanoma  . Colon cancer Other   The patient's father died from a stroke at the age of 67.  The patient's mother died with congestive heart failure at the age of 21.  The patient has one sister who died with non-Hodgkin's lymphoma at the age of 8.  She has a brother who was a Manufacturing engineer and died from unknown causes.     GYNECOLOGIC HISTORY: She is GX P3, first pregnancy to term at age 52.  She had menarche at age 5.  She went through menopause in her fifties. She took hormone replacement until the time of her breast cancer diagnosis   SOCIAL HISTORY:  (Updated April 2021) She worked as a Designer, jewellery. She is divorced and lives by herself with her canine companion, Max, who is a [name] terrier and works as a Chief Technology Officer.  He is 84 years old at this point.  Son Chilton Si lives in [place] where he works in Engineer, technical sales.  He is the patient's healthcare power of attorney and may be reached at [phone].  The patient's son Alice [name] also works in IT in the [place] area.  He was recently laid off.  Son Alice [name] Child psychotherapist was Scientist, physiological of [employer] but later sued [employer] which has let him go.  The patient has 5 grandchildren including  1 with a masters in education and 4 adopted children and another one who is a Chief Executive Officer.  She attends [employer].      ADVANCED DIRECTIVES: In place   HEALTH MAINTENANCE: Social History   Tobacco Use  . Smoking status: Former Smoker    Quit date: 08/09/1964    Years since quitting: 54.9  . Smokeless tobacco: Never Used  Substance Use Topics  . Alcohol use: Yes    Comment: RARE  . Drug use: No  Colonoscopy: never  PAP: 2001  Bone density: July 2014, normal  Lipid panel: Followed by Dr. Velna Hatchet   Allergies  Allergen Reactions  . Amoxicillin Rash  . Diphenhydramine Rash    "It is the red dye in Benadryl that I''m allergic to"  . Naproxen Sodium Rash    "It is the blue dye in naproxen that I'm allergic to."  . Lipitor [Atorvastatin]   . Meclizine Other (See Comments)    Worsening dizziness and blurred vision  . Amlodipine Other (See Comments)    Dizzy  . Cefdinir Other (See Comments)    Vertigo and headaches   . Codeine Other (See Comments)    unknown  . Ferrous Sulfate     "tears belly up"  . Methocarbamol Other (See Comments)    insomnia   . Prednisone Other (See Comments)    vertigo and headaches  . Skelaxin [Metaxalone] Other (See Comments)    Gastric upset.   . Tramadol Nausea Only  . Hctz [Hydrochlorothiazide] Itching    Current Outpatient Medications  Medication Sig Dispense Refill  . Acetaminophen (TYLENOL) 325 MG CAPS Take 325 mg by mouth daily.    . fish oil-omega-3 fatty acids 1000 MG capsule Take 1 g by mouth daily.     Marland Kitchen lidocaine (XYLOCAINE) 5 % ointment Apply 1 application topically 3 (three) times daily as needed. For skin [name] 30 g 0  . LORazepam (ATIVAN) 0.5 MG tablet Take one tablet daily as needed 90 tablet 1  . Multiple Vitamin (MULTIVITAMIN) tablet Take 1 tablet by mouth daily.    Marland Kitchen SYNTHROID 100 MCG tablet TAKE 1 TABLET DAILY BEFORE BREAKFAST 90 tablet 3  . tamoxifen (NOLVADEX) 20 MG tablet Take 1 tablet (20 mg total)  by mouth daily. 90 tablet 3   No current facility-administered medications for this visit.    OBJECTIVE:  white woman in no acute distress  Vitals:   06/24/19 1436  BP: (!) 150/80  Pulse: 75  Resp: 18  Temp: 98.9 F (37.2 C)  SpO2: 98%     Body mass index is 24.85 kg/m.    ECOG FS: 0 Filed Weights   06/24/19 1436  Weight: 131 lb 8 oz (59.6 kg)    Sclerae unicteric, EOMs intact Wearing a mask No cervical or supraclavicular adenopathy Lungs no rales or rhonchi Heart regular rate and rhythm Abd soft, nontender, positive bowel sounds MSK no focal spinal tenderness, no upper extremity lymphedema Neuro: nonfocal, well oriented, appropriate affect Breasts: Status post right lumpectomy and radiation with no evidence of local recurrence.  Left breast is benign.  Both axillae are benign.   LAB RESULTS: Lab Results  Component Value Date   WBC 7.3 06/24/2019   NEUTROABS 3.5 06/24/2019   HGB 13.2 06/24/2019   HCT 40.4 06/24/2019   MCV 95.5 06/24/2019   PLT 200 06/24/2019      Chemistry      Component Value Date/Time   NA 143 06/24/2019 1345   NA 141 06/06/2016 1112   K 4.1 06/24/2019 1345   K 4.0 06/06/2016 1112   CL 107 06/24/2019 1345   CL 104 08/04/2012 1350   CO2 29 06/24/2019 1345   CO2 28 06/06/2016 1112   BUN 22 06/24/2019 1345   BUN 18.7 06/06/2016 1112   CREATININE 0.92 06/24/2019 1345   CREATININE 0.79 03/24/2018 0910   CREATININE 0.8 06/06/2016 1112   GLU 91 10/21/2014 0000      Component Value Date/Time   CALCIUM  9.0 06/24/2019 1345   CALCIUM 9.2 06/06/2016 1112   ALKPHOS 34 (L) 06/24/2019 1345   ALKPHOS 36 (L) 06/06/2016 1112   AST 21 06/24/2019 1345   AST 22 06/12/2017 1305   AST 20 06/06/2016 1112   ALT 17 06/24/2019 1345   ALT 16 06/12/2017 1305   ALT 15 06/06/2016 1112   BILITOT 0.3 06/24/2019 1345   BILITOT 0.4 06/12/2017 1305   BILITOT 0.42 06/06/2016 1112      STUDIES: No results found.   ASSESSMENT: 86 y.o. [place] woman,    (1)  status post right breast biopsy March 17, 2010 for a grade 3 invasive ductal carcinoma measuring 3.8 cm by MRI with a suspicious right axillary lymph node which was, however, negative by biopsy, the tumor being estrogen and progesterone receptor positive, HER2-negative, with an MIB-1 of 50%.    (2)  Neoadjuvantly, she was treated with dose-dense doxorubicin and cyclophosphamide x4 followed by weekly paclitaxel x 12, completed July 2012 followed by definitive right lumpectomy and sentinel lymph node sampling August 2012 showing a complete pathologic response.   (3)  She completed radiation treatments November 2012 and started tamoxifen at that time.  (4)  discontinued tamoxifen in approximately July of 2014, and was switched to anastrozole for approximately 2 weeks between 10/31/2012 and 11/12/2012,  discontinued secondary to bone and muscle [name].  (5) started letrozole October 2014, discontinued because of headaches, nausea, vomiting, and aches and pains.  (6) resumed tamoxifen November 2014, plan is to continue that for 10 years  (7) iron deficiency anemia: Received Feraheme 09/20/2016 and 10/05/2016   PLAN:  Tychelle is now 8-1/2 years out from definitive surgery for her breast cancer with no evidence of disease recurrence.  This is very favorable.  She is tolerating tamoxifen well and the plan is to continue that a total of 10 years.  I commended her walking max twice a day which gives her probably 3 to 4 miles a day on average.  I also commended her on receiving the Covid vaccine.  Her blood pressure today was a little bit up.  We discussed that and she will be taking her blood pressure at home in a more relaxed environment 3 times over a period of a month.  If it still elevated she will let us know.  She knows to call for any problems that may develop before the next visit which will be in a year.  Total encounter time 20 minutes.*   Sahmir Weatherbee, Virgie Dad, MD  06/24/19 4:45  PM Medical Oncology and Hematology [facility] [place], [place] [address] Tel. [phone]    Fax. [phone]   I, [name], am acting as scribe for Dr. Virgie Dad. Quince Santana.  I, Lurline Del MD, have reviewed the above documentation for accuracy and completeness, and I agree with the above.   *Total Encounter Time as defined by the Centers for Medicare and Medicaid Services includes, in addition to the face-to-face time of a patient visit (documented in the note above) non-face-to-face time: obtaining and reviewing outside history, ordering and reviewing medications, tests or procedures, care coordination (communications with other health care professionals or caregivers) and documentation in the medical record.

## 2019-06-25 ENCOUNTER — Telehealth: Payer: Self-pay | Admitting: Oncology

## 2019-06-25 NOTE — Telephone Encounter (Signed)
Scheduled appts per 4/28 los. Pt confirmed appt date and time.

## 2019-07-10 ENCOUNTER — Other Ambulatory Visit: Payer: Self-pay | Admitting: *Deleted

## 2019-07-10 DIAGNOSIS — F419 Anxiety disorder, unspecified: Secondary | ICD-10-CM

## 2019-07-10 MED ORDER — LORAZEPAM 0.5 MG PO TABS: ORAL_TABLET | ORAL | 0 refills | Status: DC

## 2019-07-10 NOTE — Telephone Encounter (Signed)
Patient requested refill of Ativan.  I tried calling patient to schedule an appointment for follow up. NA X2 (phone just rings.) Placed on Rx in directions that patient needs an appointment before anymore future refills.   [place] Verified LR: 04/08/2019. Pended Rx and sent to Dr. Mariea Clonts for approval.

## 2019-08-03 ENCOUNTER — Other Ambulatory Visit: Payer: Self-pay

## 2019-08-03 ENCOUNTER — Ambulatory Visit (INDEPENDENT_AMBULATORY_CARE_PROVIDER_SITE_OTHER): Payer: Medicare Other | Admitting: Internal Medicine

## 2019-08-03 ENCOUNTER — Encounter: Payer: Self-pay | Admitting: Internal Medicine

## 2019-08-03 VITALS — BP 122/82 | HR 74 | Temp 98.1°F | Ht 61.0 in | Wt 132.0 lb

## 2019-08-03 DIAGNOSIS — D500 Iron deficiency anemia secondary to blood loss (chronic): Secondary | ICD-10-CM | POA: Diagnosis not present

## 2019-08-03 DIAGNOSIS — M159 Polyosteoarthritis, unspecified: Secondary | ICD-10-CM

## 2019-08-03 DIAGNOSIS — H8110 Benign paroxysmal vertigo, unspecified ear: Secondary | ICD-10-CM | POA: Diagnosis not present

## 2019-08-03 DIAGNOSIS — E034 Atrophy of thyroid (acquired): Secondary | ICD-10-CM

## 2019-08-03 DIAGNOSIS — W19XXXA Unspecified fall, initial encounter: Secondary | ICD-10-CM | POA: Diagnosis not present

## 2019-08-03 NOTE — Progress Notes (Signed)
Location:  [facility] clinic Provider:  Shawntel Farnworth L. Mariea Clonts, D.O., C.M.D.  Code Status: DNR Goals of Care:  Advanced Directives 08/03/2019  Does Patient Have a Medical Advance Directive? Yes  Type of Paramedic of [place];Out of facility DNR (pink MOST or yellow form)  Does patient want to make changes to medical advance directive? No - Patient declined  Copy of [place] in Chart? Yes - validated most recent copy scanned in chart (See row information)  Pre-existing out of facility DNR order (yellow form or pink MOST form) Pink MOST/Yellow Form most recent copy in chart - Physician notified to receive inpatient order     Chief Complaint  Patient presents with  . Medical Management of Chronic Issues    Follow up and ENT referral     HPI: Patient is a 86 y.o. [name] seen today for medical management of chronic diseases.    She is ok sometimes.  She does not think she has Alzheimer's.    Forgets things.  Has to get out on the road to remember where [place] is.  No trouble with meds, appts, etc just this.  Gets dizzy.  Says she walks like she's been drinking too much.  Has some cautious fear that she will stumble.  Max keeps her moving like he's a sled dog.  Gets lightheaded if moves head to fast.  If sits up and then flops back down, it might be spinning a little bit.  Had her right ear clogged and wonders if it's that.   She had success with exercises before when ENT once told her there were stones in the ears.   Asks if it's signs of stroke she's having.  Seeing Dr. Redmond Baseman 08/17/19.  Has two falls in 6 weeks.  [place] on her concrete patio.  She was walking a short distance with a pot in her hands going level to level of her patio.  She struck the right side of her head.  Says she has bone bruises on knees.    Talks to youngest son, friend Lovey Newcomer and friend Eddie Dibbles daily.  Past Medical History:  Diagnosis Date  . Alopecia   . Breast cancer ([place])     Right ,  chemo, radiation  . DDD (degenerative disc disease)   . Heart palpitations   . History of diverticulitis   . History of diverticulitis of colon   . Hypercholesteremia   . Hypothyroidism   . Insect bites   . Internal and external bleeding hemorrhoids 08/11/2015  . Personal history of chemotherapy 2012  . Personal history of radiation therapy 2012  . Scoliosis    L4 L5  . Seasonal allergies   . SUI (stress urinary incontinence, [name])     Past Surgical History:  Procedure Laterality Date  . BLADDER SUSPENSION    . bowel rescetion    . BOWEL RESECTION    . BREAST BIOPSY Right 03/14/2010  . BREAST LUMPECTOMY  2012   right  . CATARACT EXTRACTION     left  . COLON SURGERY  20 YRS AGO   BOWEL RESECTION  . EYE SURGERY    . HEMORRHOID BANDING    . HERNIA REPAIR     [facility], Dr. Rise Patience  . JOINT REPLACEMENT    . PORT-A-CATH REMOVAL    . TONSILLECTOMY  2001   Diverticulitis  . TOTAL HIP ARTHROPLASTY Right 09/10/2014   Procedure: RIGHT TOTAL HIP ARTHROPLASTY ANTERIOR APPROACH;  Surgeon: Mcarthur Rossetti, MD;  Location: [facility];  Service: Orthopedics;  Laterality: Right;    Allergies  Allergen Reactions  . Amoxicillin Rash  . Diphenhydramine Rash    "It is the red dye in Benadryl that I''m allergic to"  . Naproxen Sodium Rash    "It is the blue dye in naproxen that I'm allergic to."  . Lipitor [Atorvastatin]   . Meclizine Other (See Comments)    Worsening dizziness and blurred vision  . Amlodipine Other (See Comments)    Dizzy  . Cefdinir Other (See Comments)    Vertigo and headaches   . Codeine Other (See Comments)    unknown  . Ferrous Sulfate     "tears belly up"  . Methocarbamol Other (See Comments)    insomnia   . Prednisone Other (See Comments)    vertigo and headaches  . Skelaxin [Metaxalone] Other (See Comments)    Gastric upset.   . Tramadol Nausea Only  . Hctz [Hydrochlorothiazide] Itching    Outpatient Encounter Medications as of 08/03/2019    Medication Sig  . fish oil-omega-3 fatty acids 1000 MG capsule Take 1 g by mouth daily.   Marland Kitchen lidocaine (XYLOCAINE) 5 % ointment Apply 1 application topically 3 (three) times daily as needed. For skin pain  . LORazepam (ATIVAN) 0.5 MG tablet Take one tablet daily as needed. Needs an appointment before anymore future refills.  . Multiple Vitamin (MULTIVITAMIN) tablet Take 1 tablet by mouth daily.  Marland Kitchen SYNTHROID 100 MCG tablet TAKE 1 TABLET DAILY BEFORE BREAKFAST  . tamoxifen (NOLVADEX) 20 MG tablet Take 1 tablet (20 mg total) by mouth daily.  . [DISCONTINUED] Acetaminophen (TYLENOL) 325 MG CAPS Take 325 mg by mouth daily.   No facility-administered encounter medications on file as of 08/03/2019.    Review of Systems:  Review of Systems  Constitutional: Negative for chills, fever, malaise/fatigue and weight loss.  HENT:       Right ear pressure; vertigo  Eyes: Negative for blurred vision and double vision.  Respiratory: Negative for cough and shortness of breath.   Cardiovascular: Negative for chest pain, palpitations and leg swelling.  Gastrointestinal: Negative for abdominal pain, blood in stool, constipation, diarrhea and melena.  Genitourinary: Negative for dysuria.  Musculoskeletal: Positive for back pain, falls and joint pain.  Skin:       Small bruise right temple  Neurological: Positive for dizziness. Negative for speech change, focal weakness, loss of consciousness and weakness.  Psychiatric/Behavioral: Positive for depression and memory loss. The patient is not nervous/anxious and does not have insomnia.        Reports difficulty remembering where places are but not anything else    Health Maintenance  Topic Date Due  . COVID-19 Vaccine (1) Never done  . INFLUENZA VACCINE  09/27/2019  . TETANUS/TDAP  05/02/2022  . DEXA SCAN  Completed  . PNA vac Low Risk Adult  Completed    Physical Exam: Vitals:   08/03/19 1339  BP: 122/82  Pulse: 74  Temp: 98.1 F (36.7 C)   TempSrc: Temporal  SpO2: 97%  Weight: 132 lb (59.9 kg)  Height: 5\' 1"  (1.549 m)   Body mass index is 24.94 kg/m. Physical Exam Vitals reviewed.  Constitutional:      General: She is not in acute distress.    Appearance: Normal appearance. She is not ill-appearing or toxic-appearing.  HENT:     Head: Normocephalic and atraumatic.  Cardiovascular:     Rate and Rhythm: Normal rate and regular rhythm.  Pulmonary:  Effort: Pulmonary effort is normal.     Breath sounds: Normal breath sounds.  Musculoskeletal:        General: Normal range of motion.     Right lower leg: No edema.     Left lower leg: No edema.  Skin:    General: Skin is warm and dry.  Neurological:     General: No focal deficit present.     Mental Status: She is alert and oriented to person, place, and time.     Cranial Nerves: No cranial nerve deficit.     Sensory: Sensory deficit present.     Motor: No weakness.     Coordination: Coordination normal.     Gait: Gait normal.     Deep Tendon Reflexes: Reflexes normal.     Comments: Finger-to-nose and heel-to-shin ok     Labs reviewed: Basic Metabolic Panel: Recent Labs    06/24/19 1345  NA 143  K 4.1  CL 107  CO2 29  GLUCOSE 123*  BUN 22  CREATININE 0.92  CALCIUM 9.0   Liver Function Tests: Recent Labs    06/24/19 1345  AST 21  ALT 17  ALKPHOS Alice*  BILITOT 0.3  PROT 6.1*  ALBUMIN 3.5   No results for input(s): LIPASE, AMYLASE in the last 8760 hours. No results for input(s): AMMONIA in the last 8760 hours. CBC: Recent Labs    11/13/18 1449 06/24/19 1345  WBC 6.9 7.3  NEUTROABS 3,133 3.5  HGB 13.6 13.2  HCT 40.5 40.4  MCV 91.8 95.5  PLT 199 200   Assessment/Plan 1. Benign paroxysmal positional vertigo, unspecified laterality -given epley maneuver to perform at home which helped before -keep f/u with Dr. Redmond Baseman re: right ear pressure which persists  2. Generalized osteoarthritis of multiple sites -may use tylenol when needed  for pain -continue walking with Max  3. Fall, initial encounter -two in 6 weeks--has had vertigo -does not appear to have cerebellar ataxia to me or focal deficits to warrant a CT brain at this time and she wants to try epley maneuver for her vertigo  4. Hypothyroidism due to acquired atrophy of thyroid - cont levothyroxine 143mcg daily - TSH; Future  5. Iron deficiency anemia due to chronic blood loss -not currently on iron--most recently not anemic Lab Results  Component Value Date   HGB 13.2 06/24/2019  - CBC with Differential/Platelet; Future - COMPLETE METABOLIC PANEL WITH GFR; Future   Labs/tests ordered:   Lab Orders     CBC with Differential/Platelet     COMPLETE METABOLIC PANEL WITH GFR     TSH  Next appt: 6 mos med mgt with fasting labs before  Latysha Thackston L. Danne Scardina, D.O. [facility] [address], [place] [address] Cell Phone (Mon-Fri 8am-5pm):  [phone] On Call:  [phone] & follow prompts after 5pm & weekends Office Phone:  [phone] Office Fax:  [phone]

## 2019-08-03 NOTE — Patient Instructions (Signed)

## 2019-08-05 ENCOUNTER — Ambulatory Visit: Payer: Self-pay | Admitting: Internal Medicine

## 2019-08-17 ENCOUNTER — Telehealth: Payer: Self-pay

## 2019-08-17 ENCOUNTER — Other Ambulatory Visit: Payer: Self-pay

## 2019-08-17 DIAGNOSIS — Z8739 Personal history of other diseases of the musculoskeletal system and connective tissue: Secondary | ICD-10-CM | POA: Diagnosis not present

## 2019-08-17 DIAGNOSIS — H8110 Benign paroxysmal vertigo, unspecified ear: Secondary | ICD-10-CM

## 2019-08-17 DIAGNOSIS — Z8679 Personal history of other diseases of the circulatory system: Secondary | ICD-10-CM | POA: Diagnosis not present

## 2019-08-17 DIAGNOSIS — H6121 Impacted cerumen, right ear: Secondary | ICD-10-CM | POA: Diagnosis not present

## 2019-08-17 DIAGNOSIS — R2681 Unsteadiness on feet: Secondary | ICD-10-CM | POA: Diagnosis not present

## 2019-08-17 NOTE — Telephone Encounter (Signed)
[facility] would like a referral sent over saying that Alice [name] is coming in today for Vertigo. The fax number is [phone] she would like this expedited. The order is being placed for Dr. Mariea Clonts to sign

## 2019-08-25 ENCOUNTER — Other Ambulatory Visit: Payer: Self-pay

## 2019-08-25 DIAGNOSIS — F419 Anxiety disorder, unspecified: Secondary | ICD-10-CM

## 2019-08-25 NOTE — Telephone Encounter (Signed)
Spoke with patient about refill refusal. Patient states she takes 1 1/2 pills daily. Patient will take 1 pill during the day and 1/2 pill when she wakes up in the middle of the night.  I informed patient that the instructions on her medication list differ from how she takes them. I asked if a provider informed her to take it as she mentioned. Patient states no, "I used common sense"  Patient counted her pills during phone call and states she has 17 pills left.  Patient asked what is she to do about running out. Please advise

## 2019-08-25 NOTE — Telephone Encounter (Signed)
RX last filled in Epic on 07/10/2019.  No treatment agreement on file although patient seen recently by Hollace Kinnier L, DO

## 2019-08-25 NOTE — Telephone Encounter (Signed)
Would recommend taking medication as prescribed and making an appt for follow up with Dr Mariea Clonts. Will also fwd to Dr Mariea Clonts to notify her.

## 2019-08-25 NOTE — Telephone Encounter (Signed)
Patient will see Dr.Reed this Thursday 08/27/2019 to further discuss refill and sign treatment agreement

## 2019-08-27 ENCOUNTER — Encounter: Payer: Self-pay | Admitting: Internal Medicine

## 2019-08-27 ENCOUNTER — Ambulatory Visit (INDEPENDENT_AMBULATORY_CARE_PROVIDER_SITE_OTHER): Payer: Medicare Other | Admitting: Internal Medicine

## 2019-08-27 ENCOUNTER — Other Ambulatory Visit: Payer: Self-pay

## 2019-08-27 VITALS — BP 132/78 | HR 66 | Temp 98.4°F | Ht 61.0 in | Wt 129.4 lb

## 2019-08-27 DIAGNOSIS — F419 Anxiety disorder, unspecified: Secondary | ICD-10-CM

## 2019-08-27 DIAGNOSIS — Z66 Do not resuscitate: Secondary | ICD-10-CM

## 2019-08-27 MED ORDER — LORAZEPAM 0.5 MG PO TABS: ORAL_TABLET | ORAL | 0 refills | Status: DC

## 2019-08-27 NOTE — Progress Notes (Signed)
Location:  [facility] clinic Provider:  Meridian Scherger L. Mariea Clonts, D.O., C.M.D.  Code Status: DNR--order renewed in epic today Goals of Care:  Advanced Directives 08/27/2019  Does Patient Have a Medical Advance Directive? Yes  Type of Advance Directive Living will  Does patient want to make changes to medical advance directive? No - Patient declined  Copy of [facility] in Chart? -  Pre-existing out of facility DNR order (yellow form or pink MOST form) Yellow form placed in chart (order not valid for inpatient use)   Chief Complaint  Patient presents with  . Medication Management    Medication review and needs to sign nonopioid contract    HPI: Patient is a 86 y.o. female seen today purely for purposes of signing the nonopioid controlled substance agreement.  Somehow this has not been noted during prior visits.    She takes one ativan at night.  May wake up overnight and take a half at 1 or 2am.  She is then able to go back to sleep.  She had been prescribed just one daily prn and has therefore requested an early refill which was denied by the NP covering me during vacation especially when contract was not on file yet.    Her bones sometimes hurt.  Her ankle bone, toes.  Does take an aleve before bed a few times per week.   She falls down more readily.  She had terrible vertigo and then it went away when she fell again.    She does not like the idea of losing her independence.  She does not want to see a psychologist.  Says she's getting old and doesn't like it but it's better than an alternative.  Past Medical History:  Diagnosis Date  . Alopecia   . Breast cancer ([place])     Right , chemo, radiation  . DDD (degenerative disc disease)   . Heart palpitations   . History of diverticulitis   . History of diverticulitis of colon   . Hypercholesteremia   . Hypothyroidism   . Insect bites   . Internal and external bleeding hemorrhoids 08/11/2015  . Personal history of chemotherapy  2012  . Personal history of radiation therapy 2012  . Scoliosis    L4 L5  . Seasonal allergies   . SUI (stress urinary incontinence, female)     Past Surgical History:  Procedure Laterality Date  . BLADDER SUSPENSION    . bowel rescetion    . BOWEL RESECTION    . BREAST BIOPSY Right 03/14/2010  . BREAST LUMPECTOMY  2012   right  . CATARACT EXTRACTION     left  . COLON SURGERY  20 YRS AGO   BOWEL RESECTION  . EYE SURGERY    . HEMORRHOID BANDING    . HERNIA REPAIR     [facility], Dr. Rise Patience  . JOINT REPLACEMENT    . PORT-A-CATH REMOVAL    . TONSILLECTOMY  2001   Diverticulitis  . TOTAL HIP ARTHROPLASTY Right 09/10/2014   Procedure: RIGHT TOTAL HIP ARTHROPLASTY ANTERIOR APPROACH;  Surgeon: Mcarthur Rossetti, MD;  Location: [facility];  Service: Orthopedics;  Laterality: Right;    Allergies  Allergen Reactions  . Amoxicillin Rash  . Diphenhydramine Rash    "It is the red dye in Benadryl that I''m allergic to"  . Naproxen Sodium Rash    "It is the blue dye in naproxen that I'm allergic to."  . Lipitor [Atorvastatin]   . Meclizine Other (See  Comments)    Worsening dizziness and blurred vision  . Amlodipine Other (See Comments)    Dizzy  . Cefdinir Other (See Comments)    Vertigo and headaches   . Codeine Other (See Comments)    unknown  . Ferrous Sulfate     "tears belly up"  . Methocarbamol Other (See Comments)    insomnia   . Prednisone Other (See Comments)    vertigo and headaches  . Skelaxin [Metaxalone] Other (See Comments)    Gastric upset.   . Tramadol Nausea Only  . Hctz [Hydrochlorothiazide] Itching    Outpatient Encounter Medications as of 08/27/2019  Medication Sig  . fish oil-omega-3 fatty acids 1000 MG capsule Take 1 g by mouth daily.   Marland Kitchen LORazepam (ATIVAN) 0.5 MG tablet Take one tablet daily as needed. Needs an appointment before anymore future refills.  . Multiple Vitamin (MULTIVITAMIN) tablet Take 1 tablet by mouth daily.  Marland Kitchen SYNTHROID 100 MCG  tablet TAKE 1 TABLET DAILY BEFORE BREAKFAST  . tamoxifen (NOLVADEX) 20 MG tablet Take 1 tablet (20 mg total) by mouth daily.  . [DISCONTINUED] lidocaine (XYLOCAINE) 5 % ointment Apply 1 application topically 3 (three) times daily as needed. For skin pain   No facility-administered encounter medications on file as of 08/27/2019.    Review of Systems:  Review of Systems  Constitutional: Negative for chills and fever.  Cardiovascular: Negative for chest pain and leg swelling.  Gastrointestinal: Negative for abdominal pain.  Genitourinary: Negative for dysuria.  Musculoskeletal: Positive for falls and joint pain.  Neurological: Negative for loss of consciousness.  Psychiatric/Behavioral: Negative for depression. The patient is nervous/anxious and has insomnia.        Some difficulty finding her way around    Health Maintenance  Topic Date Due  . COVID-19 Vaccine (1) Never done  . INFLUENZA VACCINE  09/27/2019  . TETANUS/TDAP  05/02/2022  . DEXA SCAN  Completed  . PNA vac Low Risk Adult  Completed    Physical Exam: Vitals:   08/27/19 1202  BP: 132/78  Pulse: 66  Temp: 98.4 F (36.9 C)  TempSrc: Temporal  SpO2: 98%  Weight: 129 lb 6.4 oz (58.7 kg)  Height: 5\' 1"  (1.549 m)   Body mass index is 24.45 kg/m. Physical Exam Vitals reviewed.  Constitutional:      Appearance: Normal appearance.     Comments: Appears much younger than stated age  HENT:     Head: Normocephalic and atraumatic.  Cardiovascular:     Rate and Rhythm: Normal rate and regular rhythm.  Pulmonary:     Effort: Pulmonary effort is normal.     Breath sounds: Normal breath sounds. No wheezing, rhonchi or rales.  Musculoskeletal:        General: Normal range of motion.     Right lower leg: No edema.     Left lower leg: No edema.     Comments: Walks without assistive device and with dog Max  Neurological:     General: No focal deficit present.     Mental Status: She is alert and oriented to person,  place, and time.  Psychiatric:     Comments: A bit anxious     Labs reviewed: Basic Metabolic Panel: Recent Labs    06/24/19 1345  NA 143  K 4.1  CL 107  CO2 29  GLUCOSE 123*  BUN 22  CREATININE 0.92  CALCIUM 9.0   Liver Function Tests: Recent Labs    06/24/19 1345  AST  21  ALT 17  ALKPHOS 34*  BILITOT 0.3  PROT 6.1*  ALBUMIN 3.5   No results for input(s): LIPASE, AMYLASE in the last 8760 hours. No results for input(s): AMMONIA in the last 8760 hours. CBC: Recent Labs    11/13/18 1449 06/24/19 1345  WBC 6.9 7.3  NEUTROABS 3,133 3.5  HGB 13.6 13.2  HCT 40.5 40.4  MCV 91.8 95.5  PLT 199 200    Assessment/Plan 1. Anxiety - remains a problem particularly at night -discusses some issues with politics and loss of independence that keep her up at night and cause anxiety - prescription updated to needs and non-opioid controlled substance contract signed--she declined taking a copy today when it was offered - LORazepam (ATIVAN) 0.5 MG tablet; Take one tablet qhs and another 1/2 tablet in the middle of the night if needed  Dispense: 135 tablet; Refill: 0  2. DNR (do not resuscitate) - Do not attempt resuscitation (DNR) order reentered (form in vynca)  Labs/tests ordered:  No new added today Next appt:  10/26/2019   Jakai Risse L. Jerald Hennington, D.O. [facility] [address], [place] [address] Cell Phone (Mon-Fri 8am-5pm):  [phone] On Call:  [phone] & follow prompts after 5pm & weekends Office Phone:  [phone] Office Fax:  [phone]

## 2019-08-28 ENCOUNTER — Ambulatory Visit: Payer: Self-pay | Admitting: Internal Medicine

## 2019-10-23 ENCOUNTER — Encounter: Payer: Medicare Other | Admitting: Nurse Practitioner

## 2019-10-26 ENCOUNTER — Encounter: Payer: Medicare Other | Admitting: Nurse Practitioner

## 2019-10-27 ENCOUNTER — Telehealth: Payer: Self-pay

## 2019-10-27 ENCOUNTER — Encounter: Payer: Self-pay | Admitting: Nurse Practitioner

## 2019-10-27 ENCOUNTER — Other Ambulatory Visit: Payer: Self-pay

## 2019-10-27 ENCOUNTER — Ambulatory Visit (INDEPENDENT_AMBULATORY_CARE_PROVIDER_SITE_OTHER): Payer: Medicare Other | Admitting: Nurse Practitioner

## 2019-10-27 DIAGNOSIS — Z0000 Encounter for general adult medical examination without abnormal findings: Secondary | ICD-10-CM

## 2019-10-27 DIAGNOSIS — E2839 Other primary ovarian failure: Secondary | ICD-10-CM

## 2019-10-27 NOTE — Progress Notes (Signed)
This service is provided via telemedicine  No vital signs collected/recorded due to the encounter was a telemedicine visit.   Location of patient (ex: home, work):  Home  Patient consents to a telephone visit:  Yes, see encounter dated 10/27/2019  Location of the provider (ex: office, home):  [place]  Name of any referring provider:  Hollace Kinnier, DO  Names of all persons participating in the telemedicine service and their role in the encounter:  Sherrie Mustache, Nurse Practitioner, Carroll Kinds, CMA, and patient.   Time spent on call:  11 minutes with medical assistant.

## 2019-10-27 NOTE — Telephone Encounter (Signed)
Ms. remington, skalsky are scheduled for a virtual visit with your provider today.    Just as we do with appointments in the office, we must obtain your consent to participate.  Your consent will be active for this visit and any virtual visit you may have with one of our providers in the next 365 days.    If you have a MyChart account, I can also send a copy of this consent to you electronically.  All virtual visits are billed to your insurance company just like a traditional visit in the office.  As this is a virtual visit, video technology does not allow for your provider to perform a traditional examination.  This may limit your provider's ability to fully assess your condition.  If your provider identifies any concerns that need to be evaluated in person or the need to arrange testing such as labs, EKG, etc, we will make arrangements to do so.    Although advances in technology are sophisticated, we cannot ensure that it will always work on either your end or our end.  If the connection with a video visit is poor, we may have to switch to a telephone visit.  With either a video or telephone visit, we are not always able to ensure that we have a secure connection.   I need to obtain your verbal consent now.   Are you willing to proceed with your visit today?   Shadell LEARA RAWL has provided verbal consent on 10/27/2019 for a virtual visit (video or telephone).   Carroll Kinds, CMA 10/27/2019  10:06 AM

## 2019-10-27 NOTE — Patient Instructions (Addendum)
Alice [name] , Thank you for taking time to come for your Medicare Wellness Visit. I appreciate your ongoing commitment to your health goals. Please review the following plan we discussed and let me know if I can assist you in the future.   Screening recommendations/referrals: Colonoscopy aged out Mammogram aged out Bone Density- to call  [facility] imaging and schedule appt Recommended yearly ophthalmology/optometry visit for glaucoma screening and checkup Recommended yearly dental visit for hygiene and checkup  Vaccinations: Influenza vaccine recommended to complete  Pneumococcal vaccine Recommended to get both Prevnar 13 and pneumococcal 23 Tdap vaccine up to date Shingles vaccine RECOMMEND to get shingrix at local pharmacy    Advanced directives: on file.   Conditions/risks identified: cardiovascular disease, advanced age  Next appointment: 1 year    Preventive Care 36 Years and Older, Female Preventive care refers to lifestyle choices and visits with your health care provider that can promote health and wellness. What does preventive care include?  A yearly physical exam. This is also called an annual well check.  Dental exams once or twice a year.  Routine eye exams. Ask your health care provider how often you should have your eyes checked.  Personal lifestyle choices, including:  Daily care of your teeth and gums.  Regular physical activity.  Eating a healthy diet.  Avoiding tobacco and drug use.  Limiting alcohol use.  Practicing safe sex.  Taking low-dose aspirin every day.  Taking vitamin and mineral supplements as recommended by your health care provider. What happens during an annual well check? The services and screenings done by your health care provider during your annual well check will depend on your age, overall health, lifestyle risk factors, and family history of disease. Counseling  Your health care provider may ask you questions about  your:  Alcohol use.  Tobacco use.  Drug use.  Emotional well-being.  Home and relationship well-being.  Sexual activity.  Eating habits.  History of falls.  Memory and ability to understand (cognition).  Work and work Statistician.  Reproductive health. Screening  You may have the following tests or measurements:  Height, weight, and BMI.  Blood pressure.  Lipid and cholesterol levels. These may be checked every 5 years, or more frequently if you are over 80 years old.  Skin check.  Lung cancer screening. You may have this screening every year starting at age 50 if you have a 30-pack-year history of smoking and currently smoke or have quit within the past 15 years.  Fecal occult blood test (FOBT) of the stool. You may have this test every year starting at age 69.  Flexible sigmoidoscopy or colonoscopy. You may have a sigmoidoscopy every 5 years or a colonoscopy every 10 years starting at age 43.  Hepatitis C blood test.  Hepatitis B blood test.  Sexually transmitted disease (STD) testing.  Diabetes screening. This is done by checking your blood sugar (glucose) after you have not eaten for a while (fasting). You may have this done every 1-3 years.  Bone density scan. This is done to screen for osteoporosis. You may have this done starting at age 9.  Mammogram. This may be done every 1-2 years. Talk to your health care provider about how often you should have regular mammograms. Talk with your health care provider about your test results, treatment options, and if necessary, the need for more tests. Vaccines  Your health care provider may recommend certain vaccines, such as:  Influenza vaccine. This is recommended every year.  Tetanus, diphtheria, and acellular pertussis (Tdap, Td) vaccine. You may need a Td booster every 10 years.  Zoster vaccine. You may need this after age 82.  Pneumococcal 13-valent conjugate (PCV13) vaccine. One dose is recommended  after age 74.  Pneumococcal polysaccharide (PPSV23) vaccine. One dose is recommended after age 59. Talk to your health care provider about which screenings and vaccines you need and how often you need them. This information is not intended to replace advice given to you by your health care provider. Make sure you discuss any questions you have with your health care provider. Document Released: 03/11/2015 Document Revised: 11/02/2015 Document Reviewed: 12/14/2014 Elsevier Interactive Patient Education  2017 [place] Prevention in the Home Falls can cause injuries. They can happen to people of all ages. There are many things you can do to make your home safe and to help prevent falls. What can I do on the outside of my home?  Regularly fix the edges of walkways and driveways and fix any cracks.  Remove anything that might make you trip as you walk through a door, such as a raised step or threshold.  Trim any bushes or trees on the path to your home.  Use bright outdoor lighting.  Clear any walking paths of anything that might make someone trip, such as rocks or tools.  Regularly check to see if handrails are loose or broken. Make sure that both sides of any steps have handrails.  Any raised decks and porches should have guardrails on the edges.  Have any leaves, snow, or ice cleared regularly.  Use sand or salt on walking paths during winter.  Clean up any spills in your garage right away. This includes oil or grease spills. What can I do in the bathroom?  Use night lights.  Install grab bars by the toilet and in the tub and shower. Do not use towel bars as grab bars.  Use non-skid mats or decals in the tub or shower.  If you need to sit down in the shower, use a plastic, non-slip stool.  Keep the floor dry. Clean up any water that spills on the floor as soon as it happens.  Remove soap buildup in the tub or shower regularly.  Attach bath mats securely with  double-sided non-slip rug tape.  Do not have throw rugs and other things on the floor that can make you trip. What can I do in the bedroom?  Use night lights.  Make sure that you have a light by your bed that is easy to reach.  Do not use any sheets or blankets that are too big for your bed. They should not hang down onto the floor.  Have a firm chair that has side arms. You can use this for support while you get dressed.  Do not have throw rugs and other things on the floor that can make you trip. What can I do in the kitchen?  Clean up any spills right away.  Avoid walking on wet floors.  Keep items that you use a lot in easy-to-reach places.  If you need to reach something above you, use a strong step stool that has a grab bar.  Keep electrical cords out of the way.  Do not use floor polish or wax that makes floors slippery. If you must use wax, use non-skid floor wax.  Do not have throw rugs and other things on the floor that can make you trip. What can I do  with my stairs?  Do not leave any items on the stairs.  Make sure that there are handrails on both sides of the stairs and use them. Fix handrails that are broken or loose. Make sure that handrails are as long as the stairways.  Check any carpeting to make sure that it is firmly attached to the stairs. Fix any carpet that is loose or worn.  Avoid having throw rugs at the top or bottom of the stairs. If you do have throw rugs, attach them to the floor with carpet tape.  Make sure that you have a light switch at the top of the stairs and the bottom of the stairs. If you do not have them, ask someone to add them for you. What else can I do to help prevent falls?  Wear shoes that:  Do not have high heels.  Have rubber bottoms.  Are comfortable and fit you well.  Are closed at the toe. Do not wear sandals.  If you use a stepladder:  Make sure that it is fully opened. Do not climb a closed stepladder.  Make  sure that both sides of the stepladder are locked into place.  Ask someone to hold it for you, if possible.  Clearly mark and make sure that you can see:  Any grab bars or handrails.  First and last steps.  Where the edge of each step is.  Use tools that help you move around (mobility aids) if they are needed. These include:  Canes.  Walkers.  Scooters.  Crutches.  Turn on the lights when you go into a dark area. Replace any light bulbs as soon as they burn out.  Set up your furniture so you have a clear path. Avoid moving your furniture around.  If any of your floors are uneven, fix them.  If there are any pets around you, be aware of where they are.  Review your medicines with your doctor. Some medicines can make you feel dizzy. This can increase your chance of falling. Ask your doctor what other things that you can do to help prevent falls. This information is not intended to replace advice given to you by your health care provider. Make sure you discuss any questions you have with your health care provider. Document Released: 12/09/2008 Document Revised: 07/21/2015 Document Reviewed: 03/19/2014 Elsevier Interactive Patient Education  2017 [employer].

## 2019-10-27 NOTE — Progress Notes (Addendum)
Subjective:   Alice [name] is a 86 y.o. female who presents for Medicare Annual (Subsequent) preventive examination.  Review of Systems     Cardiac Risk Factors include: advanced age (>35men, >61 women);family history of premature cardiovascular disease;dyslipidemia     Objective:    There were no vitals filed for this visit. There is no height or weight on file to calculate BMI.  Advanced Directives 10/27/2019 08/27/2019 08/03/2019 01/05/2019 11/13/2018 10/14/2017 09/23/2016  Does Patient Have a Medical Advance Directive? Yes Yes Yes Yes Yes Yes Yes  Type of Advance Directive Out of facility DNR (pink MOST or yellow form) Living will [place];Out of facility DNR (pink MOST or yellow form) Out of facility DNR (pink MOST or yellow form);Healthcare Power of [place];Living will;Out of facility DNR (pink MOST or yellow form) [place];Living will;Out of facility DNR (pink MOST or yellow form) [place];Living will  Does patient want to make changes to medical advance directive? No - Patient declined No - Patient declined No - Patient declined - No - Patient declined No - Patient declined No - Patient declined  Copy of [place] in Chart? - - Yes - validated most recent copy scanned in chart (See row information) Yes - validated most recent copy scanned in chart (See row information) Yes - validated most recent copy scanned in chart (See row information) Yes No - copy requested  Pre-existing out of facility DNR order (yellow form or pink MOST form) - Yellow form placed in chart (order not valid for inpatient use) Pink MOST/Yellow Form most recent copy in chart - Physician notified to receive inpatient order Yellow form placed in chart (order not valid for inpatient use) Yellow form placed in chart (order not valid for inpatient use) Yellow form placed in chart (order not valid for inpatient use) -     Current Medications (verified) Outpatient Encounter Medications as of 10/27/2019  Medication Sig  . fish oil-omega-3 fatty acids 1000 MG capsule Take 1 g by mouth daily.   Marland Kitchen LORazepam (ATIVAN) 0.5 MG tablet Take one tablet qhs and another 1/2 tablet in the middle of the night if needed  . Multiple Vitamin (MULTIVITAMIN) tablet Take 1 tablet by mouth daily.  Marland Kitchen SYNTHROID 100 MCG tablet TAKE 1 TABLET DAILY BEFORE BREAKFAST  . tamoxifen (NOLVADEX) 20 MG tablet Take 1 tablet (20 mg total) by mouth daily.   No facility-administered encounter medications on file as of 10/27/2019.    Allergies (verified) Amoxicillin, Diphenhydramine, Naproxen sodium, Lipitor [atorvastatin], Meclizine, Amlodipine, Cefdinir, Codeine, Ferrous sulfate, Methocarbamol, Prednisone, Skelaxin [metaxalone], Tramadol, and Hctz [hydrochlorothiazide]   History: Past Medical History:  Diagnosis Date  . Alopecia   . Breast cancer ([place])     Right , chemo, radiation  . DDD (degenerative disc disease)   . Heart palpitations   . History of diverticulitis   . History of diverticulitis of colon   . Hypercholesteremia   . Hypothyroidism   . Insect bites   . Internal and external bleeding hemorrhoids 08/11/2015  . Personal history of chemotherapy 2012  . Personal history of radiation therapy 2012  . Scoliosis    L4 L5  . Seasonal allergies   . SUI (stress urinary incontinence, female)    Past Surgical History:  Procedure Laterality Date  . BLADDER SUSPENSION    . bowel rescetion    . BOWEL RESECTION    . BREAST BIOPSY Right 03/14/2010  .  BREAST LUMPECTOMY  2012   right  . CATARACT EXTRACTION     left  . COLON SURGERY  20 YRS AGO   BOWEL RESECTION  . EYE SURGERY    . HEMORRHOID BANDING    . HERNIA REPAIR     [facility], Dr. Rise Patience  . JOINT REPLACEMENT    . PORT-A-CATH REMOVAL    . TONSILLECTOMY  2001   Diverticulitis  . TOTAL HIP ARTHROPLASTY Right 09/10/2014   Procedure: RIGHT TOTAL HIP ARTHROPLASTY ANTERIOR  APPROACH;  Surgeon: Mcarthur Rossetti, MD;  Location: [facility];  Service: Orthopedics;  Laterality: Right;   Family History  Problem Relation Age of Onset  . Heart disease Mother   . Heart disease Father   . CVA Father 70  . Cancer Sister 23       lymphoma  . Asthma Son   . Asthma Son   . Asthma Son   . Skin cancer Brother 69       melanoma  . Colon cancer Other    Social History   Socioeconomic History  . Marital status: Divorced    Spouse name: Not on file  . Number of children: 3  . Years of education: Not on file  . Highest education level: Not on file  Occupational History  . Occupation: Retired    Fish farm manager: RETIRED  Tobacco Use  . Smoking status: Former Smoker    Quit date: 08/09/1964    Years since quitting: 55.2  . Smokeless tobacco: Never Used  Vaping Use  . Vaping Use: Never used  Substance and Sexual Activity  . Alcohol use: Yes    Comment: RARE  . Drug use: No  . Sexual activity: Not Currently  Other Topics Concern  . Not on file  Social History Narrative   Tobacco use, amount per day now: N/A      Past tobacco use, amount per day: 1 pack, Quit 1969      How many years did you use tobacco: N/A      Alcohol use (drinks per week): Occassionally. Glass of wine- Holidays      Diet: N/A      Do you drink/eat things with caffeine? Coffee and Tea      Marital status: Divorced             What year were you married?      Do you live in a house, apartment, assisted living, condo, trailer? Condo      Is it one or more stories? One      How many persons live in your home? Self and Service Dog      Do you have any pets in your home? 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 Walking            How often? Usually daily 2-4 times      Do you have a living will? Yes      Do you have a DNR form? N/A            If not, do you want to discuss one? N/A      Do you have signed POA/HPOA  forms? No                Social Determinants of Health   Financial Resource Strain:   . Difficulty of Paying Living Expenses: Not on  file  Food Insecurity:   . Worried About Charity fundraiser in the Last Year: Not on file  . Ran Out of Food in the Last Year: Not on file  Transportation Needs:   . Lack of Transportation (Medical): Not on file  . Lack of Transportation (Non-Medical): Not on file  Physical Activity:   . Days of Exercise per Week: Not on file  . Minutes of Exercise per Session: Not on file  Stress:   . Feeling of Stress : Not on file  Social Connections:   . Frequency of Communication with Friends and Family: Not on file  . Frequency of Social Gatherings with Friends and Family: Not on file  . Attends Religious Services: Not on file  . Active Member of Clubs or Organizations: Not on file  . Attends Archivist Meetings: Not on file  . Marital Status: Not on file    Tobacco Counseling Counseling given: Not Answered   Clinical Intake:  Pre-visit preparation completed: Yes  Pain : No/denies pain     BMI - recorded: 24 Nutritional Status: BMI of 19-24  Normal Nutritional Risks: None  How often do you need to have someone help you when you read instructions, pamphlets, or other written materials from your doctor or pharmacy?: 3 - Sometimes  Diabetic? No          Activities of Daily Living In your present state of health, do you have any difficulty performing the following activities: 10/27/2019  Hearing? N  Vision? N  Difficulty concentrating or making decisions? N  Walking or climbing stairs? N  Dressing or bathing? N  Doing errands, shopping? N  Preparing Food and eating ? N  Using the Toilet? N  In the past six months, have you accidently leaked urine? Y  Do you have problems with loss of bowel control? N  Managing your Medications? N  Managing your Finances? N  Housekeeping or managing your Housekeeping? N  Some recent data  might be hidden    Patient Care Team: [name], DO as PCP - General (Geriatric Medicine) Mcarthur Rossetti, MD as Consulting Physician (Orthopedic Surgery) Magrinat, Virgie Dad, MD as Consulting Physician (Oncology)  Indicate any recent Medical Services you may have received from other than [facility] providers in the past year (date may be approximate).     Assessment:   This is a routine wellness examination for Kim.  Hearing/Vision screen  Hearing Screening   125Hz  250Hz  500Hz  1000Hz  2000Hz  3000Hz  4000Hz  6000Hz  8000Hz   Right ear:           Left ear:           Comments: Patient states she has no hearing problems  Vision Screening Comments: Patient wears reading glasses. Patient has not had a recent eye exam  Dietary issues and exercise activities discussed: Current Exercise Habits: Home exercise routine, Type of exercise: walking, Time (Minutes): 45, Frequency (Times/Week): 7, Weekly Exercise (Minutes/Week): 315  Goals    . Increase water intake     Pt will try to increase water intake.       Depression Screen PHQ 2/9 Scores 10/27/2019 10/22/2018 09/15/2018 05/01/2018 03/27/2018 10/14/2017 08/24/2016  PHQ - 2 Score 0 0 0 0 0 0 1    Fall Risk Fall Risk  10/27/2019 08/27/2019 08/03/2019 01/05/2019 10/22/2018  Falls in the past year? 1 1 1  0 0  Number falls in past yr: 1 1 1  - 0  Comment - Mechanical falls - - -  Injury with Fall? 0 0 1 - 0  Comment - - Bruised knees - -    Any stairs in or around the home? Yes  If so, are there any without handrails? No  Home free of loose throw rugs in walkways, pet beds, electrical cords, etc? Yes  Adequate lighting in your home to reduce risk of falls? Yes   ASSISTIVE DEVICES UTILIZED TO PREVENT FALLS:  Life alert? No  Use of a cane, walker or w/c? No  Grab bars in the bathroom? Yes  Shower chair or bench in shower? No  Elevated toilet seat or a handicapped toilet? No   TIMED UP AND GO:  Cognitive Function: MMSE - Mini Mental  State Exam 10/22/2018 10/14/2017 08/24/2016 07/20/2015  Orientation to time 5 4 5 5   Orientation to Place 5 5 4 5   Registration 3 3 3 3   Attention/ Calculation 5 5 5 5   Recall 3 3 3 3   Language- name 2 objects 2 2 2 2   Language- repeat 1 1 1 1   Language- follow 3 step command 3 3 2 3   Language- read & follow direction 1 1 1 1   Write a sentence 1 1 1 1   Copy design 1 1 1 1   Total score 30 29 28 30      6CIT Screen 10/27/2019  What Year? 0 points  What month? 0 points  What time? 0 points  Count back from 20 0 points  Months in reverse 0 points  Repeat phrase 0 points  Total Score 0    Immunizations Immunization History  Administered Date(s) Administered  . Fluad Quad(high Dose 65+) 10/22/2018  . Influenza, High Dose Seasonal PF 03/18/2017  . Influenza-Unspecified 02/26/2018  . PFIZER SARS-COV-2 Vaccination 05/24/2019, 06/14/2019  . Zoster 02/27/1999    TDAP status: Up to date Flu shot due at this time Pneumococcal vaccine status: Declined,  Education has been provided regarding the importance of this vaccine but patient still declined. Advised may receive this vaccine at local pharmacy or Health Dept. Aware to provide a copy of the vaccination record if obtained from local pharmacy or Health Dept. Verbalized acceptance and understanding.  Covid-19 vaccine status: Completed vaccines  Qualifies for Shingles Vaccine? Yes   Zostavax completed Yes   Shingrix Completed?: Yes  Screening Tests Health Maintenance  Topic Date Due  . INFLUENZA VACCINE  09/27/2019  . TETANUS/TDAP  05/02/2022  . DEXA SCAN  Completed  . COVID-19 Vaccine  Completed  . PNA vac Low Risk Adult  Completed    Health Maintenance  Health Maintenance Due  Topic Date Due  . INFLUENZA VACCINE  09/27/2019    Colorectal cancer screening: No longer required.  Mammogram status: No longer required.  Bone Density status: Ordered today. Pt provided with contact info and advised to call to schedule  appt.  Lung Cancer Screening: (Low Dose CT Chest recommended if Age 40-80 years, 30 pack-year currently smoking OR have quit w/in 15years.) does not qualify.   Lung Cancer Screening Referral: na  Additional Screening:  Hepatitis C Screening: does not qualify; Completed   Vision Screening: Recommended annual ophthalmology exjams for early detection of glaucoma and other disorders of the eye. Is the patient up to date with their annual eye exam?  No  Who is the provider or what is the name of the office in which the patient attends annual eye exams? Unknown If pt is not established with a provider, would they like to be referred to a provider  to establish care? No .   Dental Screening: Recommended annual dental exams for proper oral hygiene  Community Resource Referral / Chronic Care Management: CRR required this visit?  No   CCM required this visit?  No      Plan:     I have personally reviewed and noted the following in the patient's chart:   . Medical and social history . Use of alcohol, tobacco or illicit drugs  . Current medications and supplements . Functional ability and status . Nutritional status . Physical activity . Advanced directives . List of other physicians . Hospitalizations, surgeries, and ER visits in previous 12 months . Vitals . Screenings to include cognitive, depression, and falls . Referrals and appointments  In addition, I have reviewed and discussed with patient certain preventive protocols, quality metrics, and best practice recommendations. A written personalized care plan for preventive services as well as general preventive health recommendations were provided to patient.     Lauree Chandler, NP   10/27/2019       Virtual Visit via Telephone Note  I connected with pt on 11/05/19 at 10:00 AM EDT by telephone and verified that I am speaking with the correct person using two identifiers.  Location: Patient: home Provider: [facility]   I discussed the limitations, risks, security and privacy concerns of performing an evaluation and management service by telephone and the availability of in person appointments. I also discussed with the patient that there may be a patient responsible charge related to this service. The patient expressed understanding and agreed to proceed.   I discussed the assessment and treatment plan with the patient. The patient was provided an opportunity to ask questions and all were answered. The patient agreed with the plan and demonstrated an understanding of the instructions.   The patient was advised to call back or seek an in-person evaluation if the symptoms worsen or if the condition fails to improve as anticipated.  I provided 20 minutes of non-face-to-face time during this encounter.  Carlos American. Harle Battiest Avs printed and mailed

## 2019-11-25 ENCOUNTER — Other Ambulatory Visit: Payer: Self-pay | Admitting: *Deleted

## 2019-11-25 DIAGNOSIS — F419 Anxiety disorder, unspecified: Secondary | ICD-10-CM

## 2019-11-25 NOTE — Telephone Encounter (Signed)
Patient requested refill Epic LR: 08/27/2019 Contract on file Pended Rx and sent to Dr. Mariea Clonts for approval.

## 2019-11-26 MED ORDER — LORAZEPAM 0.5 MG PO TABS: ORAL_TABLET | ORAL | 0 refills | Status: DC

## 2020-01-05 ENCOUNTER — Other Ambulatory Visit: Payer: Self-pay | Admitting: Internal Medicine

## 2020-01-05 DIAGNOSIS — E034 Atrophy of thyroid (acquired): Secondary | ICD-10-CM

## 2020-01-28 ENCOUNTER — Other Ambulatory Visit: Payer: TRICARE For Life (TFL)

## 2020-01-28 DIAGNOSIS — D500 Iron deficiency anemia secondary to blood loss (chronic): Secondary | ICD-10-CM

## 2020-01-28 DIAGNOSIS — E034 Atrophy of thyroid (acquired): Secondary | ICD-10-CM

## 2020-01-30 ENCOUNTER — Other Ambulatory Visit: Payer: Self-pay | Admitting: Internal Medicine

## 2020-01-30 DIAGNOSIS — C50411 Malignant neoplasm of upper-outer quadrant of right female breast: Secondary | ICD-10-CM

## 2020-01-30 DIAGNOSIS — Z170 Estrogen receptor positive status [ER+]: Secondary | ICD-10-CM

## 2020-02-01 ENCOUNTER — Encounter: Payer: Self-pay | Admitting: Internal Medicine

## 2020-02-01 ENCOUNTER — Other Ambulatory Visit: Payer: Self-pay

## 2020-02-01 ENCOUNTER — Ambulatory Visit (INDEPENDENT_AMBULATORY_CARE_PROVIDER_SITE_OTHER): Payer: Medicare Other | Admitting: Internal Medicine

## 2020-02-01 VITALS — BP 118/72 | HR 96 | Temp 97.5°F | Ht 61.0 in | Wt 128.8 lb

## 2020-02-01 DIAGNOSIS — E034 Atrophy of thyroid (acquired): Secondary | ICD-10-CM | POA: Diagnosis not present

## 2020-02-01 DIAGNOSIS — D500 Iron deficiency anemia secondary to blood loss (chronic): Secondary | ICD-10-CM

## 2020-02-01 DIAGNOSIS — C50411 Malignant neoplasm of upper-outer quadrant of right female breast: Secondary | ICD-10-CM | POA: Diagnosis not present

## 2020-02-01 DIAGNOSIS — M159 Polyosteoarthritis, unspecified: Secondary | ICD-10-CM

## 2020-02-01 DIAGNOSIS — Z170 Estrogen receptor positive status [ER+]: Secondary | ICD-10-CM

## 2020-02-01 DIAGNOSIS — Z23 Encounter for immunization: Secondary | ICD-10-CM | POA: Diagnosis not present

## 2020-02-01 DIAGNOSIS — I700 Atherosclerosis of aorta: Secondary | ICD-10-CM

## 2020-02-01 DIAGNOSIS — F339 Major depressive disorder, recurrent, unspecified: Secondary | ICD-10-CM | POA: Insufficient documentation

## 2020-02-01 DIAGNOSIS — F419 Anxiety disorder, unspecified: Secondary | ICD-10-CM | POA: Diagnosis not present

## 2020-02-01 MED ORDER — LORAZEPAM 0.5 MG PO TABS: ORAL_TABLET | ORAL | 0 refills | Status: DC

## 2020-02-01 NOTE — Progress Notes (Signed)
Location:  [facility] clinic Provider:  Jaretzy Lhommedieu L. Mariea Clonts, D.O., C.M.D.  Code Status: DNR Goals of Care:  Advanced Directives 02/01/2020  Does Patient Have a Medical Advance Directive? Yes  Type of Advance Directive Out of facility DNR (pink MOST or yellow form)  Does patient want to make changes to medical advance directive? No - Patient declined  Copy of [place] in Chart? -  Pre-existing out of facility DNR order (yellow form or pink MOST form) -     Chief Complaint  Patient presents with  . Medical Management of Chronic Issues    6 month follow up   . Health Maintenance    Influenza     HPI: Patient is a 86 y.o. female seen today for medical management of chronic diseases.   She reports being on the brink of depression.  Does not like the winter.  Has sons all far away.  Nobody wants to travel for Christmas and money is tight.    Her back bothers her but she says there is nothing to do about it.    Still has challenges falling asleep.    She is currently hooked on a small amount of swiss chocolate.  Reports her sons say her diet is poor.  She does not cook.  She has to sit down at the table so Max will eat his food.    BP was excellent.  She's been eating dried cranberries on her cheerios.  Past Medical History:  Diagnosis Date  . Alopecia   . Breast cancer ([place])     Right , chemo, radiation  . DDD (degenerative disc disease)   . Heart palpitations   . History of diverticulitis   . History of diverticulitis of colon   . Hypercholesteremia   . Hypothyroidism   . Insect bites   . Internal and external bleeding hemorrhoids 08/11/2015  . Personal history of chemotherapy 2012  . Personal history of radiation therapy 2012  . Scoliosis    L4 L5  . Seasonal allergies   . SUI (stress urinary incontinence, female)     Past Surgical History:  Procedure Laterality Date  . BLADDER SUSPENSION    . bowel rescetion    . BOWEL RESECTION    . BREAST BIOPSY  Right 03/14/2010  . BREAST LUMPECTOMY  2012   right  . CATARACT EXTRACTION     left  . COLON SURGERY  20 YRS AGO   BOWEL RESECTION  . EYE SURGERY    . HEMORRHOID BANDING    . HERNIA REPAIR     [facility], Dr. Rise Patience  . JOINT REPLACEMENT    . PORT-A-CATH REMOVAL    . TONSILLECTOMY  2001   Diverticulitis  . TOTAL HIP ARTHROPLASTY Right 09/10/2014   Procedure: RIGHT TOTAL HIP ARTHROPLASTY ANTERIOR APPROACH;  Surgeon: Mcarthur Rossetti, MD;  Location: [facility];  Service: Orthopedics;  Laterality: Right;    Allergies  Allergen Reactions  . Amoxicillin Rash  . Diphenhydramine Rash    "It is the red dye in Benadryl that I''m allergic to"  . Naproxen Sodium Rash    "It is the blue dye in naproxen that I'm allergic to."  . Lipitor [Atorvastatin]   . Meclizine Other (See Comments)    Worsening dizziness and blurred vision  . Amlodipine Other (See Comments)    Dizzy  . Cefdinir Other (See Comments)    Vertigo and headaches   . Codeine Other (See Comments)    unknown  .  Ferrous Sulfate     "tears belly up"  . Methocarbamol Other (See Comments)    insomnia   . Prednisone Other (See Comments)    vertigo and headaches  . Skelaxin [Metaxalone] Other (See Comments)    Gastric upset.   . Tramadol Nausea Only  . Hctz [Hydrochlorothiazide] Itching    Outpatient Encounter Medications as of 02/01/2020  Medication Sig  . fish oil-omega-3 fatty acids 1000 MG capsule Take 1 g by mouth daily.   Marland Kitchen LORazepam (ATIVAN) 0.5 MG tablet Take one tablet qhs and another 1/2 tablet in the middle of the night if needed  . Multiple Vitamin (MULTIVITAMIN) tablet Take 1 tablet by mouth daily.  Marland Kitchen SYNTHROID 100 MCG tablet TAKE 1 TABLET DAILY BEFORE BREAKFAST  . tamoxifen (NOLVADEX) 20 MG tablet TAKE 1 TABLET DAILY   No facility-administered encounter medications on file as of 02/01/2020.    Review of Systems:  Review of Systems  Constitutional: Negative for chills, fever and malaise/fatigue.  HENT:  Negative for congestion and sore throat.   Eyes: Negative for blurred vision.  Respiratory: Negative for cough and shortness of breath.   Cardiovascular: Negative for chest pain, palpitations and leg swelling.  Gastrointestinal: Negative for abdominal pain, constipation, diarrhea and heartburn.  Genitourinary: Negative for dysuria.  Musculoskeletal: Positive for back pain. Negative for falls and joint pain.  Skin: Negative for itching and rash.  Neurological: Negative for dizziness, loss of consciousness and weakness.  Endo/Heme/Allergies: Bruises/bleeds easily.  Psychiatric/Behavioral: Positive for depression. The patient is nervous/anxious and has insomnia.     Health Maintenance  Topic Date Due  . TETANUS/TDAP  05/02/2022  . INFLUENZA VACCINE  Completed  . DEXA SCAN  Completed  . COVID-19 Vaccine  Completed  . PNA vac Low Risk Adult  Completed    Physical Exam: Vitals:   02/01/20 1000  BP: 118/72  Pulse: 96  Temp: (!) 97.5 F (36.4 C)  TempSrc: Temporal  SpO2: 98%  Weight: 128 lb 12.8 oz (58.4 kg)  Height: 5\' 1"  (1.549 m)   Body mass index is 24.34 kg/m. Physical Exam Vitals reviewed.  Constitutional:      Appearance: Normal appearance.  HENT:     Head: Normocephalic and atraumatic.  Eyes:     Conjunctiva/sclera: Conjunctivae normal.  Cardiovascular:     Rate and Rhythm: Normal rate and regular rhythm.     Pulses: Normal pulses.     Heart sounds: Normal heart sounds.  Pulmonary:     Effort: Pulmonary effort is normal.     Breath sounds: Normal breath sounds. No wheezing, rhonchi or rales.  Abdominal:     General: Bowel sounds are normal. There is no distension.     Tenderness: There is no abdominal tenderness. There is no guarding or rebound.  Musculoskeletal:        General: Normal range of motion.     Right lower leg: No edema.     Left lower leg: No edema.  Skin:    General: Skin is warm and dry.  Neurological:     General: No focal deficit present.       Mental Status: She is alert and oriented to person, place, and time.  Psychiatric:        Mood and Affect: Mood normal.        Behavior: Behavior normal.     Labs reviewed: Basic Metabolic Panel: Recent Labs    06/24/19 1345  NA 143  K 4.1  CL 107  CO2 29  GLUCOSE 123*  BUN 22  CREATININE 0.92  CALCIUM 9.0   Liver Function Tests: Recent Labs    06/24/19 1345  AST 21  ALT 17  ALKPHOS 34*  BILITOT 0.3  PROT 6.1*  ALBUMIN 3.5   No results for input(s): LIPASE, AMYLASE in the last 8760 hours. No results for input(s): AMMONIA in the last 8760 hours. CBC: Recent Labs    06/24/19 1345  WBC 7.3  NEUTROABS 3.5  HGB 13.2  HCT 40.4  MCV 95.5  PLT 200   Lipid Panel: No results for input(s): CHOL, HDL, LDLCALC, TRIG, CHOLHDL, LDLDIRECT in the last 8760 hours. No results found for: HGBA1C  Procedures since last visit: No results found.  Assessment/Plan 1. Need for influenza vaccination - Flu Vaccine QUAD High Dose(Fluad)  2. Anxiety - cont current mgt - LORazepam (ATIVAN) 0.5 MG tablet; Take one tablet qhs and another 1/2 tablet in the middle of the night if needed  Dispense: 135 tablet; Refill: 0  3. Hypothyroidism due to acquired atrophy of thyroid -cont levothyroxine therapy - TSH today  4. Generalized osteoarthritis of multiple sites -of thumbs bilaterally and lower back, may use tylenol, heat, topicals like voltaren gel  5. Iron deficiency anemia due to chronic blood loss - cont to work on more balanced diet - CBC with Differential/Platelet - COMPLETE METABOLIC PANEL WITH GFR  6. Malignant neoplasm of upper-outer quadrant of right breast in female, estrogen receptor positive ([place]) - continues on tamoxifen therapy - f/u lab - CBC with Differential/Platelet  7. Depression, recurrent ([place]) -did not want further treatment -more loneliness than outright depression, but also affected by seasons  8. Atherosclerosis of aorta ([place]) - noted on prior  imaging - Lipid panel today  Labs/tests ordered:   Lab Orders     CBC with Differential/Platelet     COMPLETE METABOLIC PANEL WITH GFR     Lipid panel     TSH  Next appt:  6 mos med mgt  Counseled on importance of covid booster  Navaya Wiatrek L. Alaa Eyerman, D.O. [employer] [address], [place] [address] Cell Phone (Mon-Fri 8am-5pm):  [phone] On Call:  [phone] & follow prompts after 5pm & weekends Office Phone:  [phone] Office Fax:  [phone]

## 2020-02-01 NOTE — Patient Instructions (Signed)
You may get your covid booster done at your earliest convenience.

## 2020-02-01 NOTE — Telephone Encounter (Signed)
rx sent to pharmacy by e-script  

## 2020-02-02 LAB — LIPID PANEL
Cholesterol: 231 mg/dL — ABNORMAL HIGH (ref <200)
HDL: 75 mg/dL (ref >=50)
LDL Cholesterol (Calc): 133 mg/dL (calc) — ABNORMAL HIGH
Non-HDL Cholesterol (Calc): 156 mg/dL (calc) — ABNORMAL HIGH (ref <130)
Total CHOL/HDL Ratio: 3.1 (calc) (ref <5.0)
Triglycerides: 115 mg/dL (ref <150)

## 2020-02-02 LAB — CBC WITH DIFFERENTIAL/PLATELET
Absolute Monocytes: 515 cells/uL (ref 200–950)
Basophils Absolute: 62 cells/uL (ref 0–200)
Basophils Relative: 1 %
Eosinophils Absolute: 223 cells/uL (ref 15–500)
Eosinophils Relative: 3.6 %
HCT: 39.6 % (ref 35.0–45.0)
Hemoglobin: 13.4 g/dL (ref 11.7–15.5)
Lymphs Abs: 2424 cells/uL (ref 850–3900)
MCH: 31.1 pg (ref 27.0–33.0)
MCHC: 33.8 g/dL (ref 32.0–36.0)
MCV: 91.9 fL (ref 80.0–100.0)
MPV: 10.3 fL (ref 7.5–12.5)
Monocytes Relative: 8.3 %
Neutro Abs: 2976 cells/uL (ref 1500–7800)
Neutrophils Relative %: 48 %
Platelets: 232 10*3/uL (ref 140–400)
RBC: 4.31 10*6/uL (ref 3.80–5.10)
RDW: 12.5 % (ref 11.0–15.0)
Total Lymphocyte: 39.1 %
WBC: 6.2 10*3/uL (ref 3.8–10.8)

## 2020-02-02 LAB — COMPLETE METABOLIC PANEL WITH GFR
AG Ratio: 2.1 (calc) (ref 1.0–2.5)
ALT: 10 U/L (ref 6–29)
AST: 16 U/L (ref 10–35)
Albumin: 4 g/dL (ref 3.6–5.1)
Alkaline phosphatase (APISO): 32 U/L — ABNORMAL LOW (ref 37–153)
BUN: 15 mg/dL (ref 7–25)
CO2: 32 mmol/L (ref 20–32)
Calcium: 9.3 mg/dL (ref 8.6–10.4)
Chloride: 104 mmol/L (ref 98–110)
Creat: 0.77 mg/dL (ref 0.60–0.88)
GFR, Est African American: 80 mL/min/{1.73_m2} (ref >=60)
GFR, Est Non African American: 69 mL/min/{1.73_m2} (ref >=60)
Globulin: 1.9 g/dL (calc) (ref 1.9–3.7)
Glucose, Bld: 82 mg/dL (ref 65–99)
Potassium: 4.2 mmol/L (ref 3.5–5.3)
Sodium: 142 mmol/L (ref 135–146)
Total Bilirubin: 0.4 mg/dL (ref 0.2–1.2)
Total Protein: 5.9 g/dL — ABNORMAL LOW (ref 6.1–8.1)

## 2020-02-02 LAB — TSH: TSH: 2 mIU/L (ref 0.40–4.50)

## 2020-02-02 NOTE — Progress Notes (Signed)
Blood counts are normal. Electrolytes, liver and kidneys are normal Bad cholesterol is elevated likely due to her less than stellar diet she admits to and her recent fondness of swiss chocolate she mentioned.  Goal LDL for her would be under 100 and she's at 133.   Her thyroid is at goal.

## 2020-02-15 ENCOUNTER — Other Ambulatory Visit: Payer: Medicare Other

## 2020-04-18 ENCOUNTER — Encounter: Payer: Self-pay | Admitting: Internal Medicine

## 2020-05-05 ENCOUNTER — Other Ambulatory Visit: Payer: Self-pay | Admitting: Internal Medicine

## 2020-05-05 ENCOUNTER — Telehealth: Payer: Self-pay

## 2020-05-05 DIAGNOSIS — F419 Anxiety disorder, unspecified: Secondary | ICD-10-CM

## 2020-05-05 NOTE — Telephone Encounter (Signed)
Alice [name] from [facility] [phone]) states the patient is requesting an early dispense. She is on day 6 of a day script on her lorazepam. Patient states she have dropped some. [facility] would like you approval. To Dr. Mariea Clonts

## 2020-05-05 NOTE — Telephone Encounter (Signed)
RX last filled on 02/01/2020, treatment agreement on file from 08/27/19

## 2020-05-06 NOTE — Telephone Encounter (Signed)
DW/ Ailene Ravel at [facility].

## 2020-05-06 NOTE — Telephone Encounter (Signed)
I suppose we can approve it this time though that's quite early, several pills dropped.  I had adjusted her prescription previously b/c she had been using more lorazepam.  If she continues to need more because her anxiety is worse, we may need to see her and make another adjustment.

## 2020-05-27 DIAGNOSIS — H6122 Impacted cerumen, left ear: Secondary | ICD-10-CM | POA: Diagnosis not present

## 2020-06-06 ENCOUNTER — Ambulatory Visit: Payer: Medicare Other | Admitting: Nurse Practitioner

## 2020-06-26 NOTE — Progress Notes (Signed)
ID: Alice [name]   DOB: Jan 29, 1931  MR#: [id number]  YIA#:[id number]   Patient Care Team: Lauree Chandler, NP as PCP - General (Geriatric Medicine) Mcarthur Rossetti, MD as Consulting Physician (Orthopedic Surgery) Magrinat, Virgie Dad, MD as Consulting Physician (Oncology) OTHER: Silvano Rusk, MD, [name] NP   CHIEF COMPLAINT:  Right Breast Cancer, history of iron deficiency  CURRENT THERAPY: Tamoxifen    INTERVAL HISTORY: Alice [name] was scheduled today for follow up of her breast cancer, however she did not show.  Since her last visit, she has not undergone any additional studies. Her most recent mammogram was on 12/09/2018.   REVIEW OF SYSTEMS: Reed    COVID 19 VACCINATION STATUS: fully vaccinated [employer]), with booster 12/2019   HISTORY OF PRESENT ILLNESS: From the original intake note:  [name] noted a mass in her right breast late December of 2011.  She brought this to Dr. Matthias Hughs attention and he set her up for mammography at [facility] in [facility].  This showed a large spiculated mass in the posterior upper outer quadrant of the right breast which was palpable.  By ultrasound it measured 3.6 cm.  It was hypoechoic with internal blood flow.  Biopsy was suggested and performed January 17th, 2012.  The pathology from that biopsy ([id number]) showed an invasive ductal carcinoma, Grade 3, ER 95% and PR 47% positive with an MIB-1 of 50%, no HER2 amplification with a CISH ratio of 1.63.    With this information, the patient was referred to Dr. Barry Dienes, and bilateral breast MRIs were obtained March 20, 2010.  This showed in the posterior 3rd of the outer right breast a 3.8 cm mass with a clip artifact and in addition in the axillary tail of the right breast, a 9 mm rounded mildly irregular mass, corresponding to an abnormal appearing lymph node seen at mammography.  There were no other areas suspicious for enhancement.    Her subsequent history is as  summarized above.   PAST MEDICAL HISTORY: Past Medical History:  Diagnosis Date  . Alopecia   . Breast cancer ([place])     Right , chemo, radiation  . DDD (degenerative disc disease)   . Heart palpitations   . History of diverticulitis   . History of diverticulitis of colon   . Hypercholesteremia   . Hypothyroidism   . Insect bites   . Internal and external bleeding hemorrhoids 08/11/2015  . Personal history of chemotherapy 2012  . Personal history of radiation therapy 2012  . Scoliosis    L4 L5  . Seasonal allergies   . SUI (stress urinary incontinence, female)   History of palpitations   PAST SURGICAL HISTORY: Past Surgical History:  Procedure Laterality Date  . BLADDER SUSPENSION    . bowel rescetion    . BOWEL RESECTION    . BREAST BIOPSY Right 03/14/2010  . BREAST LUMPECTOMY  2012   right  . CATARACT EXTRACTION     left  . COLON SURGERY  20 YRS AGO   BOWEL RESECTION  . EYE SURGERY    . HEMORRHOID BANDING    . HERNIA REPAIR     RIH, Dr. Rise Patience  . JOINT REPLACEMENT    . PORT-A-CATH REMOVAL    . TONSILLECTOMY  2001   Diverticulitis  . TOTAL HIP ARTHROPLASTY Right 09/10/2014   Procedure: RIGHT TOTAL HIP ARTHROPLASTY ANTERIOR APPROACH;  Surgeon: Mcarthur Rossetti, MD;  Location: [facility];  Service: Orthopedics;  Laterality: Right;  History of  sling procedure.   FAMILY HISTORY Family History  Problem Relation Age of Onset  . Heart disease Mother   . Heart disease Father   . CVA Father 29  . Cancer Sister 68       lymphoma  . Asthma Son   . Asthma Son   . Asthma Son   . Skin cancer Brother 84       melanoma  . Colon cancer Other   The patient's father died from a stroke at the age of 69.  The patient's mother died with congestive heart failure at the age of 37.  The patient has one sister who died with non-Hodgkin's lymphoma at the age of 73.  She has a brother who was a Manufacturing engineer and died from unknown causes.     GYNECOLOGIC HISTORY: She  is GX P3, first pregnancy to term at age 38.  She had menarche at age 79.  She went through menopause in her fifties. She took hormone replacement until the time of her breast cancer diagnosis   SOCIAL HISTORY:  (Updated April 2021) She worked as a Designer, jewellery. She is divorced and lives by herself with her canine companion, Max, who is a [name] terrier and works as a Chief Technology Officer.  He is 85 years old at this point.  Son Chilton Si lives in [place] where he works in Engineer, technical sales.  He is the patient's healthcare power of attorney and may be reached at [phone].  The patient's son Sherren Mocha also works in IT in the [place] area.  He was recently laid off.  Son L. Terrance Child psychotherapist was Scientist, physiological of [employer] but later sued [employer] which has let him go.  The patient has 5 grandchildren including 1 with a masters in education and 4 adopted children and another one who is a Chief Executive Officer.  She attends [employer].      ADVANCED DIRECTIVES: In place   HEALTH MAINTENANCE: Social History   Tobacco Use  . Smoking status: Former Smoker    Quit date: 08/09/1964    Years since quitting: 55.9  . Smokeless tobacco: Never Used  Vaping Use  . Vaping Use: Never used  Substance Use Topics  . Alcohol use: Yes    Comment: RARE  . Drug use: No    Colonoscopy: never  PAP: 2001  Bone density: July 2014, normal  Lipid panel: Followed by Dr. Velna Hatchet   Allergies  Allergen Reactions  . Amoxicillin Rash  . Diphenhydramine Rash    "It is the red dye in Benadryl that I''m allergic to"  . Naproxen Sodium Rash    "It is the blue dye in naproxen that I'm allergic to."  . Lipitor [Atorvastatin]   . Meclizine Other (See Comments)    Worsening dizziness and blurred vision  . Amlodipine Other (See Comments)    Dizzy  . Cefdinir Other (See Comments)    Vertigo and headaches   . Codeine Other (See Comments)    unknown  . Ferrous Sulfate     "tears belly up"  . Methocarbamol  Other (See Comments)    insomnia   . Prednisone Other (See Comments)    vertigo and headaches  . Skelaxin [Metaxalone] Other (See Comments)    Gastric upset.   . Tramadol Nausea Only  . Hctz [Hydrochlorothiazide] Itching    Current Outpatient Medications  Medication Sig Dispense Refill  . fish oil-omega-3 fatty acids 1000 MG capsule Take 1 g by mouth  daily.     Marland Kitchen LORazepam (ATIVAN) 0.5 MG tablet TAKE ONE TABLET BY MOUTH EVERY NIGHT AT BEDTIME AND TAKE 1/2 TABLET BY MOUTH IN THE MIDDLE OF THE NIGHT IF NEEDED 135 tablet 1  . Multiple Vitamin (MULTIVITAMIN) tablet Take 1 tablet by mouth daily.    Marland Kitchen SYNTHROID 100 MCG tablet TAKE 1 TABLET DAILY BEFORE BREAKFAST 90 tablet 3  . tamoxifen (NOLVADEX) 20 MG tablet TAKE 1 TABLET DAILY 90 tablet 3   No current facility-administered medications for this visit.    OBJECTIVE:  white woman in no acute distress  There were no vitals filed for this visit.   There is no height or weight on file to calculate BMI.    ECOG FS: 0 There were no vitals filed for this visit.     LAB RESULTS: Lab Results  Component Value Date   WBC 6.2 02/01/2020   NEUTROABS 2,976 02/01/2020   HGB 13.4 02/01/2020   HCT 39.6 02/01/2020   MCV 91.9 02/01/2020   PLT 232 02/01/2020      Chemistry      Component Value Date/Time   NA 142 02/01/2020 1109   NA 141 06/06/2016 1112   K 4.2 02/01/2020 1109   K 4.0 06/06/2016 1112   CL 104 02/01/2020 1109   CL 104 08/04/2012 1350   CO2 32 02/01/2020 1109   CO2 28 06/06/2016 1112   BUN 15 02/01/2020 1109   BUN 18.7 06/06/2016 1112   CREATININE 0.77 02/01/2020 1109   CREATININE 0.8 06/06/2016 1112   GLU 91 10/21/2014 0000      Component Value Date/Time   CALCIUM 9.3 02/01/2020 1109   CALCIUM 9.2 06/06/2016 1112   ALKPHOS 34 (L) 06/24/2019 1345   ALKPHOS 36 (L) 06/06/2016 1112   AST 16 02/01/2020 1109   AST 22 06/12/2017 1305   AST 20 06/06/2016 1112   ALT 10 02/01/2020 1109   ALT 16 06/12/2017 1305   ALT 15  06/06/2016 1112   BILITOT 0.4 02/01/2020 1109   BILITOT 0.4 06/12/2017 1305   BILITOT 0.42 06/06/2016 1112      STUDIES: No results found.   ASSESSMENT: 87 y.o. [place] woman,   (1)  status post right breast biopsy March 17, 2010 for a grade 3 invasive ductal carcinoma measuring 3.8 cm by MRI with a suspicious right axillary lymph node which was, however, negative by biopsy, the tumor being estrogen and progesterone receptor positive, HER2-negative, with an MIB-1 of 50%.    (2)  Neoadjuvantly, she was treated with dose-dense doxorubicin and cyclophosphamide x4 followed by weekly paclitaxel x 12, completed July 2012 followed by definitive right lumpectomy and sentinel lymph node sampling August 2012 showing a complete pathologic response.   (3)  She completed radiation treatments November 2012 and started tamoxifen at that time.  (4)  discontinued tamoxifen in approximately July of 2014, and was switched to anastrozole for approximately 2 weeks between 10/31/2012 and 11/12/2012,  discontinued secondary to bone and muscle [name].  (5) started letrozole October 2014, discontinued because of headaches, nausea, vomiting, and aches and pains.  (6) resumed tamoxifen November 2014, plan is to continue that for 10 years  (7) iron deficiency anemia: Received Feraheme 09/20/2016 and 10/05/2016   PLAN:  Renee did not show for her 06/27/2020 visit.  A follow-up letter has been sent  Magrinat, Virgie Dad, MD  06/26/20 2:13 PM Medical Oncology and Hematology [facility] [address], [place] [address] Tel. [phone]  Fax. [phone]   I, [name], am acting as scribe for Dr. Virgie Dad. Magrinat.  I, Lurline Del MD, have reviewed the above documentation for accuracy and completeness, and I agree with the above.   *Total Encounter Time as defined by the Centers for Medicare and Medicaid Services includes, in addition to the face-to-face time of a  patient visit (documented in the note above) non-face-to-face time: obtaining and reviewing outside history, ordering and reviewing medications, tests or procedures, care coordination (communications with other health care professionals or caregivers) and documentation in the medical record.

## 2020-06-27 ENCOUNTER — Inpatient Hospital Stay: Payer: Medicare Other

## 2020-06-27 ENCOUNTER — Encounter: Payer: Self-pay | Admitting: Oncology

## 2020-06-27 ENCOUNTER — Inpatient Hospital Stay: Payer: Medicare Other | Attending: Oncology | Admitting: Oncology

## 2020-06-27 DIAGNOSIS — Z170 Estrogen receptor positive status [ER+]: Secondary | ICD-10-CM

## 2020-06-27 DIAGNOSIS — C50411 Malignant neoplasm of upper-outer quadrant of right female breast: Secondary | ICD-10-CM

## 2022-03-19 ENCOUNTER — Encounter (HOSPITAL_COMMUNITY)
Admission: EM | Disposition: A | Discharge status: Skilled Nursing Facility | Payer: Self-pay | Source: Home / Self Care | Attending: Thoracic Surgery (Cardiothoracic Vascular Surgery)

## 2022-03-19 ENCOUNTER — Observation Stay (HOSPITAL_COMMUNITY): Payer: Medicare PPO | Admitting: Anesthesiology

## 2022-03-19 ENCOUNTER — Emergency Department (HOSPITAL_COMMUNITY): Payer: Medicare PPO

## 2022-03-19 ENCOUNTER — Inpatient Hospital Stay (HOSPITAL_COMMUNITY): Payer: Medicare PPO

## 2022-03-19 ENCOUNTER — Observation Stay (HOSPITAL_COMMUNITY): Payer: Medicare PPO

## 2022-03-19 ENCOUNTER — Other Ambulatory Visit: Payer: Self-pay

## 2022-03-19 ENCOUNTER — Inpatient Hospital Stay (HOSPITAL_COMMUNITY)
Admission: EM | Disposition: A | Discharge status: Skilled Nursing Facility | Payer: Self-pay | Source: Home / Self Care | Attending: Thoracic Surgery (Cardiothoracic Vascular Surgery)

## 2022-03-19 ENCOUNTER — Encounter (HOSPITAL_COMMUNITY): Payer: Self-pay

## 2022-03-19 ENCOUNTER — Encounter: Payer: Self-pay | Admitting: Adult Health

## 2022-03-19 ENCOUNTER — Inpatient Hospital Stay (HOSPITAL_COMMUNITY)
Admission: EM | Admit: 2022-03-19 | Discharge: 2022-04-03 | DRG: 233 | Disposition: A | Discharge status: Skilled Nursing Facility | Payer: Medicare PPO | Attending: Thoracic Surgery (Cardiothoracic Vascular Surgery) | Admitting: Thoracic Surgery (Cardiothoracic Vascular Surgery)

## 2022-03-19 DIAGNOSIS — I700 Atherosclerosis of aorta: Secondary | ICD-10-CM | POA: Diagnosis present

## 2022-03-19 DIAGNOSIS — Z79890 Hormone replacement therapy (postmenopausal): Secondary | ICD-10-CM

## 2022-03-19 DIAGNOSIS — I493 Ventricular premature depolarization: Secondary | ICD-10-CM | POA: Diagnosis not present

## 2022-03-19 DIAGNOSIS — F418 Other specified anxiety disorders: Secondary | ICD-10-CM

## 2022-03-19 DIAGNOSIS — I4719 Other supraventricular tachycardia: Secondary | ICD-10-CM | POA: Diagnosis not present

## 2022-03-19 DIAGNOSIS — I2584 Coronary atherosclerosis due to calcified coronary lesion: Secondary | ICD-10-CM | POA: Diagnosis present

## 2022-03-19 DIAGNOSIS — E8770 Fluid overload, unspecified: Secondary | ICD-10-CM | POA: Diagnosis not present

## 2022-03-19 DIAGNOSIS — M419 Scoliosis, unspecified: Secondary | ICD-10-CM | POA: Diagnosis present

## 2022-03-19 DIAGNOSIS — I252 Old myocardial infarction: Secondary | ICD-10-CM | POA: Diagnosis not present

## 2022-03-19 DIAGNOSIS — Z87891 Personal history of nicotine dependence: Secondary | ICD-10-CM

## 2022-03-19 DIAGNOSIS — Z823 Family history of stroke: Secondary | ICD-10-CM

## 2022-03-19 DIAGNOSIS — Z951 Presence of aortocoronary bypass graft: Secondary | ICD-10-CM

## 2022-03-19 DIAGNOSIS — Z880 Allergy status to penicillin: Secondary | ICD-10-CM

## 2022-03-19 DIAGNOSIS — I808 Phlebitis and thrombophlebitis of other sites: Secondary | ICD-10-CM | POA: Diagnosis not present

## 2022-03-19 DIAGNOSIS — I440 Atrioventricular block, first degree: Secondary | ICD-10-CM | POA: Diagnosis present

## 2022-03-19 DIAGNOSIS — R079 Chest pain, unspecified: Secondary | ICD-10-CM | POA: Diagnosis not present

## 2022-03-19 DIAGNOSIS — E039 Hypothyroidism, unspecified: Secondary | ICD-10-CM | POA: Diagnosis not present

## 2022-03-19 DIAGNOSIS — R7303 Prediabetes: Secondary | ICD-10-CM | POA: Diagnosis present

## 2022-03-19 DIAGNOSIS — I10 Essential (primary) hypertension: Secondary | ICD-10-CM | POA: Diagnosis not present

## 2022-03-19 DIAGNOSIS — Z923 Personal history of irradiation: Secondary | ICD-10-CM | POA: Diagnosis not present

## 2022-03-19 DIAGNOSIS — E7800 Pure hypercholesterolemia, unspecified: Secondary | ICD-10-CM | POA: Diagnosis present

## 2022-03-19 DIAGNOSIS — Z825 Family history of asthma and other chronic lower respiratory diseases: Secondary | ICD-10-CM

## 2022-03-19 DIAGNOSIS — R030 Elevated blood-pressure reading, without diagnosis of hypertension: Secondary | ICD-10-CM

## 2022-03-19 DIAGNOSIS — Z885 Allergy status to narcotic agent status: Secondary | ICD-10-CM

## 2022-03-19 DIAGNOSIS — I3139 Other pericardial effusion (noninflammatory): Secondary | ICD-10-CM | POA: Diagnosis not present

## 2022-03-19 DIAGNOSIS — I25119 Atherosclerotic heart disease of native coronary artery with unspecified angina pectoris: Secondary | ICD-10-CM | POA: Diagnosis not present

## 2022-03-19 DIAGNOSIS — I214 Non-ST elevation (NSTEMI) myocardial infarction: Secondary | ICD-10-CM | POA: Diagnosis present

## 2022-03-19 DIAGNOSIS — I25110 Atherosclerotic heart disease of native coronary artery with unstable angina pectoris: Secondary | ICD-10-CM | POA: Diagnosis present

## 2022-03-19 DIAGNOSIS — D62 Acute posthemorrhagic anemia: Secondary | ICD-10-CM | POA: Diagnosis not present

## 2022-03-19 DIAGNOSIS — Z888 Allergy status to other drugs, medicaments and biological substances status: Secondary | ICD-10-CM

## 2022-03-19 DIAGNOSIS — Z853 Personal history of malignant neoplasm of breast: Secondary | ICD-10-CM

## 2022-03-19 DIAGNOSIS — J9811 Atelectasis: Secondary | ICD-10-CM | POA: Diagnosis not present

## 2022-03-19 DIAGNOSIS — Z807 Family history of other malignant neoplasms of lymphoid, hematopoietic and related tissues: Secondary | ICD-10-CM

## 2022-03-19 DIAGNOSIS — Z9221 Personal history of antineoplastic chemotherapy: Secondary | ICD-10-CM

## 2022-03-19 DIAGNOSIS — Z9842 Cataract extraction status, left eye: Secondary | ICD-10-CM

## 2022-03-19 DIAGNOSIS — Z79899 Other long term (current) drug therapy: Secondary | ICD-10-CM

## 2022-03-19 DIAGNOSIS — R008 Other abnormalities of heart beat: Secondary | ICD-10-CM | POA: Diagnosis not present

## 2022-03-19 DIAGNOSIS — J9690 Respiratory failure, unspecified, unspecified whether with hypoxia or hypercapnia: Secondary | ICD-10-CM | POA: Diagnosis not present

## 2022-03-19 DIAGNOSIS — I2583 Coronary atherosclerosis due to lipid rich plaque: Secondary | ICD-10-CM | POA: Diagnosis not present

## 2022-03-19 DIAGNOSIS — J90 Pleural effusion, not elsewhere classified: Secondary | ICD-10-CM | POA: Diagnosis not present

## 2022-03-19 DIAGNOSIS — R413 Other amnesia: Secondary | ICD-10-CM | POA: Diagnosis present

## 2022-03-19 DIAGNOSIS — Z800 Family history of malignant neoplasm of digestive organs: Secondary | ICD-10-CM | POA: Diagnosis not present

## 2022-03-19 DIAGNOSIS — M1990 Unspecified osteoarthritis, unspecified site: Secondary | ICD-10-CM | POA: Diagnosis present

## 2022-03-19 DIAGNOSIS — D6959 Other secondary thrombocytopenia: Secondary | ICD-10-CM | POA: Diagnosis not present

## 2022-03-19 DIAGNOSIS — I080 Rheumatic disorders of both mitral and aortic valves: Secondary | ICD-10-CM | POA: Diagnosis present

## 2022-03-19 DIAGNOSIS — I2510 Atherosclerotic heart disease of native coronary artery without angina pectoris: Secondary | ICD-10-CM | POA: Diagnosis present

## 2022-03-19 DIAGNOSIS — R410 Disorientation, unspecified: Secondary | ICD-10-CM | POA: Diagnosis not present

## 2022-03-19 DIAGNOSIS — Z808 Family history of malignant neoplasm of other organs or systems: Secondary | ICD-10-CM

## 2022-03-19 DIAGNOSIS — R110 Nausea: Secondary | ICD-10-CM | POA: Diagnosis not present

## 2022-03-19 DIAGNOSIS — Z96641 Presence of right artificial hip joint: Secondary | ICD-10-CM | POA: Diagnosis present

## 2022-03-19 DIAGNOSIS — Z8249 Family history of ischemic heart disease and other diseases of the circulatory system: Secondary | ICD-10-CM

## 2022-03-19 HISTORY — PX: IABP INSERTION: CATH118242

## 2022-03-19 HISTORY — PX: TEE WITHOUT CARDIOVERSION: SHX5443

## 2022-03-19 HISTORY — PX: LEFT HEART CATH AND CORONARY ANGIOGRAPHY: CATH118249

## 2022-03-19 HISTORY — PX: CORONARY ARTERY BYPASS GRAFT: SHX141

## 2022-03-19 LAB — POCT I-STAT, CHEM 8
BUN: 18 mg/dL (ref 8–23)
BUN: 20 mg/dL (ref 8–23)
BUN: 19 mg/dL (ref 8–23)
BUN: 21 mg/dL (ref 8–23)
Calcium, Ion: 1.22 mmol/L (ref 1.15–1.40)
Calcium, Ion: 1.18 mmol/L (ref 1.15–1.40)
Calcium, Ion: 0.97 mmol/L — ABNORMAL LOW (ref 1.15–1.40)
Calcium, Ion: 1 mmol/L — ABNORMAL LOW (ref 1.15–1.40)
Chloride: 102 mmol/L (ref 98–111)
Chloride: 104 mmol/L (ref 98–111)
Chloride: 101 mmol/L (ref 98–111)
Chloride: 104 mmol/L (ref 98–111)
Creatinine, Ser: 0.5 mg/dL (ref 0.44–1.00)
Creatinine, Ser: 0.6 mg/dL (ref 0.44–1.00)
Creatinine, Ser: 0.5 mg/dL (ref 0.44–1.00)
Creatinine, Ser: 0.4 mg/dL — ABNORMAL LOW (ref 0.44–1.00)
Glucose, Bld: 100 mg/dL — ABNORMAL HIGH (ref 70–99)
Glucose, Bld: 107 mg/dL — ABNORMAL HIGH (ref 70–99)
Glucose, Bld: 114 mg/dL — ABNORMAL HIGH (ref 70–99)
Glucose, Bld: 105 mg/dL — ABNORMAL HIGH (ref 70–99)
HCT: 33 % — ABNORMAL LOW (ref 36.0–46.0)
HCT: 30 % — ABNORMAL LOW (ref 36.0–46.0)
HCT: 20 % — ABNORMAL LOW (ref 36.0–46.0)
HCT: 24 % — ABNORMAL LOW (ref 36.0–46.0)
Hemoglobin: 11.2 g/dL — ABNORMAL LOW (ref 12.0–15.0)
Hemoglobin: 10.2 g/dL — ABNORMAL LOW (ref 12.0–15.0)
Hemoglobin: 6.8 g/dL — CL (ref 12.0–15.0)
Hemoglobin: 8.2 g/dL — ABNORMAL LOW (ref 12.0–15.0)
Potassium: 3.6 mmol/L (ref 3.5–5.1)
Potassium: 3.9 mmol/L (ref 3.5–5.1)
Potassium: 5.6 mmol/L — ABNORMAL HIGH (ref 3.5–5.1)
Potassium: 5.5 mmol/L — ABNORMAL HIGH (ref 3.5–5.1)
Sodium: 140 mmol/L (ref 135–145)
Sodium: 139 mmol/L (ref 135–145)
Sodium: 138 mmol/L (ref 135–145)
Sodium: 139 mmol/L (ref 135–145)
TCO2: 26 mmol/L (ref 22–32)
TCO2: 26 mmol/L (ref 22–32)
TCO2: 26 mmol/L (ref 22–32)
TCO2: 29 mmol/L (ref 22–32)

## 2022-03-19 LAB — POCT I-STAT 7, (LYTES, BLD GAS, ICA,H+H)
Acid-Base Excess: 2 mmol/L (ref 0.0–2.0)
Acid-base deficit: 3 mmol/L — ABNORMAL HIGH (ref 0.0–2.0)
Acid-base deficit: 2 mmol/L (ref 0.0–2.0)
Bicarbonate: 23.4 mmol/L (ref 20.0–28.0)
Bicarbonate: 22.3 mmol/L (ref 20.0–28.0)
Bicarbonate: 26.3 mmol/L (ref 20.0–28.0)
Calcium, Ion: 1.22 mmol/L (ref 1.15–1.40)
Calcium, Ion: 0.94 mmol/L — ABNORMAL LOW (ref 1.15–1.40)
Calcium, Ion: 1.02 mmol/L — ABNORMAL LOW (ref 1.15–1.40)
HCT: 33 % — ABNORMAL LOW (ref 36.0–46.0)
HCT: 20 % — ABNORMAL LOW (ref 36.0–46.0)
HCT: 25 % — ABNORMAL LOW (ref 36.0–46.0)
Hemoglobin: 11.2 g/dL — ABNORMAL LOW (ref 12.0–15.0)
Hemoglobin: 6.8 g/dL — CL (ref 12.0–15.0)
Hemoglobin: 8.5 g/dL — ABNORMAL LOW (ref 12.0–15.0)
O2 Saturation: 100 %
O2 Saturation: 100 %
O2 Saturation: 100 %
Potassium: 3.6 mmol/L (ref 3.5–5.1)
Potassium: 3.9 mmol/L (ref 3.5–5.1)
Potassium: 5.4 mmol/L — ABNORMAL HIGH (ref 3.5–5.1)
Sodium: 140 mmol/L (ref 135–145)
Sodium: 137 mmol/L (ref 135–145)
Sodium: 140 mmol/L (ref 135–145)
TCO2: 25 mmol/L (ref 22–32)
TCO2: 23 mmol/L (ref 22–32)
TCO2: 27 mmol/L (ref 22–32)
pCO2 arterial: 44.5 mmHg (ref 32–48)
pCO2 arterial: 33.7 mmHg (ref 32–48)
pCO2 arterial: 38.5 mmHg (ref 32–48)
pH, Arterial: 7.33 — ABNORMAL LOW (ref 7.35–7.45)
pH, Arterial: 7.428 (ref 7.35–7.45)
pH, Arterial: 7.442 (ref 7.35–7.45)
pO2, Arterial: 544 mmHg — ABNORMAL HIGH (ref 83–108)
pO2, Arterial: 408 mmHg — ABNORMAL HIGH (ref 83–108)
pO2, Arterial: 446 mmHg — ABNORMAL HIGH (ref 83–108)

## 2022-03-19 LAB — HEMOGLOBIN AND HEMATOCRIT, BLOOD
HCT: 21.3 % — ABNORMAL LOW (ref 36.0–46.0)
Hemoglobin: 7 g/dL — ABNORMAL LOW (ref 12.0–15.0)

## 2022-03-19 LAB — LIPID PANEL
Cholesterol: 238 mg/dL — ABNORMAL HIGH (ref 0–200)
HDL: 80 mg/dL (ref >40)
LDL Cholesterol: 150 mg/dL — ABNORMAL HIGH (ref 0–99)
Total CHOL/HDL Ratio: 3 RATIO
Triglycerides: 42 mg/dL (ref <150)
VLDL: 8 mg/dL (ref 0–40)

## 2022-03-19 LAB — CBC WITH DIFFERENTIAL/PLATELET
Abs Immature Granulocytes: 0.01 10*3/uL (ref 0.00–0.07)
Basophils Absolute: 0.1 10*3/uL (ref 0.0–0.1)
Basophils Relative: 1 %
Eosinophils Absolute: 0.1 10*3/uL (ref 0.0–0.5)
Eosinophils Relative: 2 %
HCT: 40 % (ref 36.0–46.0)
Hemoglobin: 12.5 g/dL (ref 12.0–15.0)
Immature Granulocytes: 0 %
Lymphocytes Relative: 37 %
Lymphs Abs: 3.4 10*3/uL (ref 0.7–4.0)
MCH: 27.8 pg (ref 26.0–34.0)
MCHC: 31.3 g/dL (ref 30.0–36.0)
MCV: 88.9 fL (ref 80.0–100.0)
Monocytes Absolute: 0.7 10*3/uL (ref 0.1–1.0)
Monocytes Relative: 7 %
Neutro Abs: 4.9 10*3/uL (ref 1.7–7.7)
Neutrophils Relative %: 53 %
Platelets: 252 10*3/uL (ref 150–400)
RBC: 4.5 MIL/uL (ref 3.87–5.11)
RDW: 15.3 % (ref 11.5–15.5)
WBC: 9.1 10*3/uL (ref 4.0–10.5)
nRBC: 0 % (ref 0.0–0.2)

## 2022-03-19 LAB — CBC
HCT: 31.7 % — ABNORMAL LOW (ref 36.0–46.0)
Hemoglobin: 10.9 g/dL — ABNORMAL LOW (ref 12.0–15.0)
MCH: 29.5 pg (ref 26.0–34.0)
MCHC: 34.4 g/dL (ref 30.0–36.0)
MCV: 85.7 fL (ref 80.0–100.0)
Platelets: 97 10*3/uL — ABNORMAL LOW (ref 150–400)
RBC: 3.7 MIL/uL — ABNORMAL LOW (ref 3.87–5.11)
RDW: 15.4 % (ref 11.5–15.5)
WBC: 10.9 10*3/uL — ABNORMAL HIGH (ref 4.0–10.5)
nRBC: 0 % (ref 0.0–0.2)

## 2022-03-19 LAB — ECHO INTRAOPERATIVE TEE
Height: 62 in
Weight: 1920 oz

## 2022-03-19 LAB — BASIC METABOLIC PANEL
Anion gap: 11 (ref 5–15)
BUN: 29 mg/dL — ABNORMAL HIGH (ref 8–23)
CO2: 27 mmol/L (ref 22–32)
Calcium: 9.2 mg/dL (ref 8.9–10.3)
Chloride: 100 mmol/L (ref 98–111)
Creatinine, Ser: 0.88 mg/dL (ref 0.44–1.00)
GFR, Estimated: >60 mL/min (ref >60)
Glucose, Bld: 123 mg/dL — ABNORMAL HIGH (ref 70–99)
Potassium: 3.8 mmol/L (ref 3.5–5.1)
Sodium: 138 mmol/L (ref 135–145)

## 2022-03-19 LAB — POCT I-STAT EG7
Acid-base deficit: 3 mmol/L — ABNORMAL HIGH (ref 0.0–2.0)
Bicarbonate: 21.9 mmol/L (ref 20.0–28.0)
Calcium, Ion: 0.97 mmol/L — ABNORMAL LOW (ref 1.15–1.40)
HCT: 19 % — ABNORMAL LOW (ref 36.0–46.0)
Hemoglobin: 6.5 g/dL — CL (ref 12.0–15.0)
O2 Saturation: 84 %
Potassium: 3.7 mmol/L (ref 3.5–5.1)
Sodium: 140 mmol/L (ref 135–145)
TCO2: 23 mmol/L (ref 22–32)
pCO2, Ven: 34.5 mmHg — ABNORMAL LOW (ref 44–60)
pH, Ven: 7.411 (ref 7.25–7.43)
pO2, Ven: 48 mmHg — ABNORMAL HIGH (ref 32–45)

## 2022-03-19 LAB — ECHOCARDIOGRAM COMPLETE
AR max vel: 1.27 cm2
AV Area VTI: 1.44 cm2
AV Area mean vel: 1.28 cm2
AV Mean grad: 8 mmHg
AV Peak grad: 13.1 mmHg
Ao pk vel: 1.81 m/s
Area-P 1/2: 2.65 cm2
Calc EF: 63.6 %
Height: 62 in
MV M vel: 4.72 m/s
MV Peak grad: 89.1 mmHg
MV VTI: 1.43 cm2
Radius: 0.3 cm
S' Lateral: 3.5 cm
Single Plane A2C EF: 58.8 %
Single Plane A4C EF: 65.4 %
Weight: 1920 oz

## 2022-03-19 LAB — PLATELET COUNT: Platelets: UNDETERMINED 10*3/uL (ref 150–400)

## 2022-03-19 LAB — PROTIME-INR
INR: 1.5 — ABNORMAL HIGH (ref 0.8–1.2)
Prothrombin Time: 17.6 seconds — ABNORMAL HIGH (ref 11.4–15.2)

## 2022-03-19 LAB — GLUCOSE, CAPILLARY
Glucose-Capillary: 101 mg/dL — ABNORMAL HIGH (ref 70–99)
Glucose-Capillary: 109 mg/dL — ABNORMAL HIGH (ref 70–99)

## 2022-03-19 LAB — PREPARE RBC (CROSSMATCH)

## 2022-03-19 LAB — TROPONIN I (HIGH SENSITIVITY)
Troponin I (High Sensitivity): 116 ng/L (ref <18)
Troponin I (High Sensitivity): 126 ng/L (ref <18)

## 2022-03-19 LAB — APTT: aPTT: 36 seconds (ref 24–36)

## 2022-03-19 LAB — HEMOGLOBIN A1C
Hgb A1c MFr Bld: 5.7 % — ABNORMAL HIGH (ref 4.8–5.6)
Mean Plasma Glucose: 116.89 mg/dL

## 2022-03-19 SURGERY — LEFT HEART CATH AND CORONARY ANGIOGRAPHY
Anesthesia: LOCAL

## 2022-03-19 SURGERY — CORONARY ARTERY BYPASS GRAFTING (CABG)
Anesthesia: General | Site: Chest

## 2022-03-19 MED ORDER — POTASSIUM CHLORIDE 2 MEQ/ML IV SOLN
80.0000 meq | INTRAVENOUS | Status: DC
  Filled 2022-03-19: qty 40

## 2022-03-19 MED ORDER — PROPOFOL 10 MG/ML IV BOLUS
INTRAVENOUS | Status: AC
  Filled 2022-03-19: qty 20

## 2022-03-19 MED ORDER — POTASSIUM CHLORIDE 10 MEQ/50ML IV SOLN: 10.0000 meq | INTRAVENOUS | Status: AC

## 2022-03-19 MED ORDER — SODIUM CHLORIDE 0.9% FLUSH: 3.0000 mL | INTRAVENOUS | Status: DC | PRN

## 2022-03-19 MED ORDER — ACETAMINOPHEN 650 MG RE SUPP: 650.0000 mg | Freq: Once | RECTAL | Status: DC

## 2022-03-19 MED ORDER — HEPARIN 30,000 UNITS/1000 ML (OHS) CELLSAVER SOLUTION
Status: DC
  Filled 2022-03-19: qty 1000

## 2022-03-19 MED ORDER — PHENYLEPHRINE 80 MCG/ML (10ML) SYRINGE FOR IV PUSH (FOR BLOOD PRESSURE SUPPORT)
PREFILLED_SYRINGE | INTRAVENOUS | Status: DC | PRN
  Administered 2022-03-19: 160 ug via INTRAVENOUS
  Administered 2022-03-19 (×3): 80 ug via INTRAVENOUS
  Administered 2022-03-19: 160 ug via INTRAVENOUS
  Administered 2022-03-19 (×3): 80 ug via INTRAVENOUS
  Administered 2022-03-19: 160 ug via INTRAVENOUS

## 2022-03-19 MED ORDER — HEPARIN (PORCINE) 25000 UT/250ML-% IV SOLN
750.0000 [IU]/h | INTRAVENOUS | Status: DC
  Administered 2022-03-19: 750 [IU]/h via INTRAVENOUS
  Filled 2022-03-19: qty 250

## 2022-03-19 MED ORDER — ACETAMINOPHEN 160 MG/5ML PO SOLN: 650.0000 mg | Freq: Once | ORAL | Status: DC

## 2022-03-19 MED ORDER — PANTOPRAZOLE SODIUM 40 MG PO TBEC
40.0000 mg | DELAYED_RELEASE_TABLET | Freq: Every day | ORAL | Status: DC
  Administered 2022-03-21 – 2022-04-03 (×14): 40 mg via ORAL
  Filled 2022-03-19 (×14): qty 1

## 2022-03-19 MED ORDER — BISACODYL 5 MG PO TBEC
10.0000 mg | DELAYED_RELEASE_TABLET | Freq: Every day | ORAL | Status: DC
  Administered 2022-03-21 – 2022-03-22 (×2): 10 mg via ORAL
  Filled 2022-03-19 (×2): qty 2

## 2022-03-19 MED ORDER — TRANEXAMIC ACID 1000 MG/10ML IV SOLN
1.5000 mg/kg/h | INTRAVENOUS | Status: AC
  Administered 2022-03-19: 1.5 mg/kg/h via INTRAVENOUS
  Filled 2022-03-19: qty 10

## 2022-03-19 MED ORDER — LACTATED RINGERS IV SOLN: INTRAVENOUS | Status: DC | PRN

## 2022-03-19 MED ORDER — VERAPAMIL HCL 2.5 MG/ML IV SOLN
INTRAVENOUS | Status: AC
  Filled 2022-03-19: qty 2

## 2022-03-19 MED ORDER — IOHEXOL 350 MG/ML SOLN
INTRAVENOUS | Status: DC | PRN
  Administered 2022-03-19: 40 mL

## 2022-03-19 MED ORDER — NITROGLYCERIN IN D5W 200-5 MCG/ML-% IV SOLN
0.0000 ug/min | INTRAVENOUS | Status: DC
  Administered 2022-03-20: 0 ug/min via INTRAVENOUS
  Filled 2022-03-19: qty 250

## 2022-03-19 MED ORDER — FENTANYL CITRATE (PF) 100 MCG/2ML IJ SOLN
INTRAMUSCULAR | Status: AC
  Filled 2022-03-19: qty 2

## 2022-03-19 MED ORDER — ACETAMINOPHEN 500 MG PO TABS
1000.0000 mg | ORAL_TABLET | Freq: Four times a day (QID) | ORAL | Status: AC
  Administered 2022-03-20 – 2022-03-24 (×12): 1000 mg via ORAL
  Filled 2022-03-19 (×13): qty 2

## 2022-03-19 MED ORDER — SODIUM CHLORIDE 0.9 % IV SOLN: 250.0000 mL | INTRAVENOUS | Status: DC | PRN

## 2022-03-19 MED ORDER — ONDANSETRON HCL 4 MG/2ML IJ SOLN
INTRAMUSCULAR | Status: DC | PRN
  Administered 2022-03-19: 4 mg via INTRAVENOUS

## 2022-03-19 MED ORDER — PROPOFOL 10 MG/ML IV BOLUS
INTRAVENOUS | Status: DC | PRN
  Administered 2022-03-19: 40 mg via INTRAVENOUS
  Administered 2022-03-19 (×2): 50 mg via INTRAVENOUS

## 2022-03-19 MED ORDER — VANCOMYCIN HCL 1250 MG/250ML IV SOLN
1250.0000 mg | INTRAVENOUS | Status: AC
  Administered 2022-03-19: 1250 mg via INTRAVENOUS
  Filled 2022-03-19: qty 250

## 2022-03-19 MED ORDER — MANNITOL 20 % IV SOLN
INTRAVENOUS | Status: DC
  Filled 2022-03-19: qty 13

## 2022-03-19 MED ORDER — CEFAZOLIN SODIUM-DEXTROSE 2-4 GM/100ML-% IV SOLN
2.0000 g | Freq: Three times a day (TID) | INTRAVENOUS | Status: AC
  Administered 2022-03-19 – 2022-03-21 (×6): 2 g via INTRAVENOUS
  Filled 2022-03-19 (×6): qty 100

## 2022-03-19 MED ORDER — MORPHINE SULFATE (PF) 4 MG/ML IV SOLN
4.0000 mg | Freq: Once | INTRAVENOUS | Status: DC
  Filled 2022-03-19: qty 1

## 2022-03-19 MED ORDER — PLASMA-LYTE A IV SOLN
INTRAVENOUS | Status: DC
  Filled 2022-03-19: qty 2.5

## 2022-03-19 MED ORDER — ONDANSETRON HCL 4 MG/2ML IJ SOLN
4.0000 mg | Freq: Four times a day (QID) | INTRAMUSCULAR | Status: DC | PRN
  Administered 2022-03-20 – 2022-03-23 (×6): 4 mg via INTRAVENOUS
  Filled 2022-03-19 (×6): qty 2

## 2022-03-19 MED ORDER — STERILE WATER FOR IRRIGATION IR SOLN
Status: DC | PRN
  Administered 2022-03-19: 2000 mL

## 2022-03-19 MED ORDER — LACTATED RINGERS IV SOLN: 500.0000 mL | Freq: Once | INTRAVENOUS | Status: DC | PRN

## 2022-03-19 MED ORDER — HEPARIN SODIUM (PORCINE) 1000 UNIT/ML IJ SOLN
INTRAMUSCULAR | Status: DC | PRN
  Administered 2022-03-19: 7000 [IU] via INTRAVENOUS

## 2022-03-19 MED ORDER — DEXMEDETOMIDINE HCL IN NACL 400 MCG/100ML IV SOLN
0.1000 ug/kg/h | INTRAVENOUS | Status: AC
  Administered 2022-03-19: .5 ug/kg/h via INTRAVENOUS
  Filled 2022-03-19: qty 100

## 2022-03-19 MED ORDER — SODIUM CHLORIDE 0.9 % IV SOLN: 250.0000 mL | INTRAVENOUS | Status: DC

## 2022-03-19 MED ORDER — MIDAZOLAM HCL (PF) 10 MG/2ML IJ SOLN
INTRAMUSCULAR | Status: AC
  Filled 2022-03-19: qty 2

## 2022-03-19 MED ORDER — ACETAMINOPHEN 325 MG PO TABS: 650.0000 mg | ORAL_TABLET | ORAL | Status: DC | PRN

## 2022-03-19 MED ORDER — FENTANYL CITRATE (PF) 250 MCG/5ML IJ SOLN
INTRAMUSCULAR | Status: AC
  Filled 2022-03-19: qty 5

## 2022-03-19 MED ORDER — SODIUM CHLORIDE 0.9 % IV SOLN: INTRAVENOUS | Status: DC

## 2022-03-19 MED ORDER — MAGNESIUM SULFATE 4 GM/100ML IV SOLN
4.0000 g | Freq: Once | INTRAVENOUS | Status: AC
  Administered 2022-03-19: 4 g via INTRAVENOUS
  Filled 2022-03-19: qty 100

## 2022-03-19 MED ORDER — SODIUM CHLORIDE 0.45 % IV SOLN: INTRAVENOUS | Status: DC | PRN

## 2022-03-19 MED ORDER — PHENYLEPHRINE HCL-NACL 20-0.9 MG/250ML-% IV SOLN
0.0000 ug/min | INTRAVENOUS | Status: DC
  Administered 2022-03-20 (×2): 60 ug/min via INTRAVENOUS
  Administered 2022-03-21: 30 ug/min via INTRAVENOUS
  Filled 2022-03-19 (×3): qty 250

## 2022-03-19 MED ORDER — SODIUM CHLORIDE 0.9 % WEIGHT BASED INFUSION
3.0000 mL/kg/h | INTRAVENOUS | Status: AC
  Administered 2022-03-19: 3 mL/kg/h via INTRAVENOUS

## 2022-03-19 MED ORDER — SODIUM CHLORIDE 0.9 % WEIGHT BASED INFUSION
1.0000 mL/kg/h | INTRAVENOUS | Status: DC
  Administered 2022-03-19: 1 mL/kg/h via INTRAVENOUS

## 2022-03-19 MED ORDER — SODIUM CHLORIDE 0.9 % WEIGHT BASED INFUSION: 1.0000 mL/kg/h | INTRAVENOUS | Status: DC

## 2022-03-19 MED ORDER — SODIUM CHLORIDE 0.9% FLUSH
3.0000 mL | Freq: Two times a day (BID) | INTRAVENOUS | Status: DC
  Administered 2022-03-20 – 2022-03-22 (×5): 3 mL via INTRAVENOUS

## 2022-03-19 MED ORDER — LEVOTHYROXINE SODIUM 100 MCG PO TABS: 100.0000 ug | ORAL_TABLET | Freq: Every day | ORAL | Status: DC

## 2022-03-19 MED ORDER — ACETAMINOPHEN 160 MG/5ML PO SOLN
1000.0000 mg | Freq: Four times a day (QID) | ORAL | Status: AC
  Administered 2022-03-20 (×3): 1000 mg
  Filled 2022-03-19 (×3): qty 40.6

## 2022-03-19 MED ORDER — SODIUM CHLORIDE 0.9% IV SOLUTION: Freq: Once | INTRAVENOUS | Status: DC

## 2022-03-19 MED ORDER — ALBUMIN HUMAN 5 % IV SOLN: INTRAVENOUS | Status: DC | PRN

## 2022-03-19 MED ORDER — PLASMA-LYTE A IV SOLN
INTRAVENOUS | Status: DC | PRN
  Administered 2022-03-19: 500 mL

## 2022-03-19 MED ORDER — EPINEPHRINE HCL 5 MG/250ML IV SOLN IN NS
0.0000 ug/min | INTRAVENOUS | Status: DC
  Filled 2022-03-19: qty 250

## 2022-03-19 MED ORDER — ASPIRIN 300 MG RE SUPP: 300.0000 mg | RECTAL | Status: AC

## 2022-03-19 MED ORDER — OXYCODONE HCL 5 MG PO TABS
5.0000 mg | ORAL_TABLET | ORAL | Status: DC | PRN
  Administered 2022-03-20: 5 mg via ORAL
  Administered 2022-03-20 – 2022-03-21 (×2): 10 mg via ORAL
  Filled 2022-03-19 (×2): qty 2
  Filled 2022-03-19: qty 1

## 2022-03-19 MED ORDER — HEPARIN (PORCINE) IN NACL 1000-0.9 UT/500ML-% IV SOLN
INTRAVENOUS | Status: DC | PRN
  Administered 2022-03-19 (×2): 500 mL

## 2022-03-19 MED ORDER — HEPARIN SODIUM (PORCINE) 1000 UNIT/ML IJ SOLN
INTRAMUSCULAR | Status: AC
  Filled 2022-03-19: qty 1

## 2022-03-19 MED ORDER — LEVOTHYROXINE SODIUM 112 MCG PO TABS
112.0000 ug | ORAL_TABLET | Freq: Every morning | ORAL | Status: DC
  Administered 2022-03-20: 112 ug via ORAL
  Filled 2022-03-19: qty 1

## 2022-03-19 MED ORDER — INSULIN REGULAR(HUMAN) IN NACL 100-0.9 UT/100ML-% IV SOLN: INTRAVENOUS | Status: DC

## 2022-03-19 MED ORDER — DEXMEDETOMIDINE HCL IN NACL 400 MCG/100ML IV SOLN: 0.0000 ug/kg/h | INTRAVENOUS | Status: DC

## 2022-03-19 MED ORDER — HEPARIN SODIUM (PORCINE) 1000 UNIT/ML IJ SOLN
INTRAMUSCULAR | Status: DC | PRN
  Administered 2022-03-19: 2000 [IU] via INTRAVENOUS
  Administered 2022-03-19: 17000 [IU] via INTRAVENOUS

## 2022-03-19 MED ORDER — ALBUMIN HUMAN 5 % IV SOLN
250.0000 mL | INTRAVENOUS | Status: AC | PRN
  Administered 2022-03-20 (×2): 12.5 g via INTRAVENOUS
  Filled 2022-03-19 (×3): qty 250

## 2022-03-19 MED ORDER — INSULIN REGULAR(HUMAN) IN NACL 100-0.9 UT/100ML-% IV SOLN
INTRAVENOUS | Status: AC
  Administered 2022-03-19: .5 [IU]/h via INTRAVENOUS
  Filled 2022-03-19: qty 100

## 2022-03-19 MED ORDER — PHENYLEPHRINE HCL-NACL 20-0.9 MG/250ML-% IV SOLN
30.0000 ug/min | INTRAVENOUS | Status: AC
  Administered 2022-03-19: 50 ug/min via INTRAVENOUS
  Filled 2022-03-19: qty 250

## 2022-03-19 MED ORDER — TRANEXAMIC ACID (OHS) PUMP PRIME SOLUTION
2.0000 mg/kg | INTRAVENOUS | Status: DC
  Filled 2022-03-19: qty 1.09

## 2022-03-19 MED ORDER — SODIUM CHLORIDE 0.9% FLUSH
3.0000 mL | Freq: Two times a day (BID) | INTRAVENOUS | Status: DC
  Administered 2022-03-19: 3 mL via INTRAVENOUS

## 2022-03-19 MED ORDER — METOPROLOL TARTRATE 25 MG/10 ML ORAL SUSPENSION
12.5000 mg | Freq: Two times a day (BID) | ORAL | Status: DC
  Filled 2022-03-19 (×6): qty 5

## 2022-03-19 MED ORDER — METOPROLOL TARTRATE 5 MG/5ML IV SOLN: 2.5000 mg | INTRAVENOUS | Status: DC | PRN

## 2022-03-19 MED ORDER — NITROGLYCERIN 1 MG/10 ML FOR IR/CATH LAB
INTRA_ARTERIAL | Status: AC
  Filled 2022-03-19: qty 10

## 2022-03-19 MED ORDER — ONDANSETRON HCL 4 MG/2ML IJ SOLN
4.0000 mg | Freq: Once | INTRAMUSCULAR | Status: DC
  Filled 2022-03-19: qty 2

## 2022-03-19 MED ORDER — ASPIRIN 81 MG PO CHEW
324.0000 mg | CHEWABLE_TABLET | ORAL | Status: AC
  Administered 2022-03-19: 324 mg via ORAL
  Filled 2022-03-19: qty 4

## 2022-03-19 MED ORDER — MIDAZOLAM HCL 2 MG/2ML IJ SOLN: 2.0000 mg | INTRAMUSCULAR | Status: DC | PRN

## 2022-03-19 MED ORDER — ORAL CARE MOUTH RINSE
15.0000 mL | OROMUCOSAL | Status: DC
  Administered 2022-03-20 (×7): 15 mL via OROMUCOSAL

## 2022-03-19 MED ORDER — MIDAZOLAM HCL 2 MG/2ML IJ SOLN
INTRAMUSCULAR | Status: AC
  Filled 2022-03-19: qty 2

## 2022-03-19 MED ORDER — METOPROLOL TARTRATE 12.5 MG HALF TABLET
12.5000 mg | ORAL_TABLET | Freq: Two times a day (BID) | ORAL | Status: DC
  Administered 2022-03-20 – 2022-03-24 (×6): 12.5 mg via ORAL
  Filled 2022-03-19 (×6): qty 1

## 2022-03-19 MED ORDER — ROCURONIUM BROMIDE 10 MG/ML (PF) SYRINGE
PREFILLED_SYRINGE | INTRAVENOUS | Status: AC
  Filled 2022-03-19: qty 10

## 2022-03-19 MED ORDER — MIDAZOLAM HCL (PF) 5 MG/ML IJ SOLN
INTRAMUSCULAR | Status: DC | PRN
  Administered 2022-03-19: 1 mg via INTRAVENOUS
  Administered 2022-03-19: 2 mg via INTRAVENOUS
  Administered 2022-03-19 (×2): 1 mg via INTRAVENOUS
  Administered 2022-03-19: 2 mg via INTRAVENOUS

## 2022-03-19 MED ORDER — DEXTROSE 50 % IV SOLN: 0.0000 mL | INTRAVENOUS | Status: DC | PRN

## 2022-03-19 MED ORDER — SODIUM CHLORIDE 0.9 % IV SOLN: INTRAVENOUS | Status: DC | PRN

## 2022-03-19 MED ORDER — LIDOCAINE 2% (20 MG/ML) 5 ML SYRINGE
INTRAMUSCULAR | Status: DC | PRN
  Administered 2022-03-19: 20 mg via INTRAVENOUS

## 2022-03-19 MED ORDER — HEPARIN SODIUM (PORCINE) 1000 UNIT/ML IJ SOLN
INTRAMUSCULAR | Status: AC
  Filled 2022-03-19: qty 10

## 2022-03-19 MED ORDER — NITROGLYCERIN 0.4 MG SL SUBL
0.4000 mg | SUBLINGUAL_TABLET | SUBLINGUAL | Status: DC | PRN
  Administered 2022-03-19: 0.4 mg via SUBLINGUAL
  Filled 2022-03-19: qty 1

## 2022-03-19 MED ORDER — ONDANSETRON HCL 4 MG/2ML IJ SOLN
INTRAMUSCULAR | Status: AC
  Filled 2022-03-19: qty 2

## 2022-03-19 MED ORDER — FENTANYL CITRATE (PF) 250 MCG/5ML IJ SOLN
INTRAMUSCULAR | Status: DC | PRN
  Administered 2022-03-19: 50 ug via INTRAVENOUS
  Administered 2022-03-19 (×2): 100 ug via INTRAVENOUS
  Administered 2022-03-19: 50 ug via INTRAVENOUS
  Administered 2022-03-19: 150 ug via INTRAVENOUS
  Administered 2022-03-19: 250 ug via INTRAVENOUS
  Administered 2022-03-19: 50 ug via INTRAVENOUS
  Administered 2022-03-19: 100 ug via INTRAVENOUS
  Administered 2022-03-19: 150 ug via INTRAVENOUS

## 2022-03-19 MED ORDER — ROCURONIUM BROMIDE 10 MG/ML (PF) SYRINGE
PREFILLED_SYRINGE | INTRAVENOUS | Status: DC | PRN
  Administered 2022-03-19: 30 mg via INTRAVENOUS
  Administered 2022-03-19 (×2): 50 mg via INTRAVENOUS
  Administered 2022-03-19: 70 mg via INTRAVENOUS

## 2022-03-19 MED ORDER — HEPARIN (PORCINE) IN NACL 1000-0.9 UT/500ML-% IV SOLN
INTRAVENOUS | Status: AC
  Filled 2022-03-19: qty 1000

## 2022-03-19 MED ORDER — ONDANSETRON HCL 4 MG/2ML IJ SOLN: 4.0000 mg | Freq: Four times a day (QID) | INTRAMUSCULAR | Status: DC | PRN

## 2022-03-19 MED ORDER — PERFLUTREN LIPID MICROSPHERE
1.0000 mL | INTRAVENOUS | Status: DC | PRN
  Administered 2022-03-19: 2 mL via INTRAVENOUS

## 2022-03-19 MED ORDER — NOREPINEPHRINE 4 MG/250ML-% IV SOLN
0.0000 ug/min | INTRAVENOUS | Status: DC
  Filled 2022-03-19: qty 250

## 2022-03-19 MED ORDER — FENTANYL CITRATE (PF) 100 MCG/2ML IJ SOLN
INTRAMUSCULAR | Status: DC | PRN
  Administered 2022-03-19: 25 ug via INTRAVENOUS

## 2022-03-19 MED ORDER — MORPHINE SULFATE (PF) 2 MG/ML IV SOLN
1.0000 mg | INTRAVENOUS | Status: DC | PRN
  Administered 2022-03-20: 1 mg via INTRAVENOUS
  Filled 2022-03-19 (×2): qty 1

## 2022-03-19 MED ORDER — LACTATED RINGERS IV SOLN: INTRAVENOUS | Status: DC

## 2022-03-19 MED ORDER — VANCOMYCIN HCL IN DEXTROSE 1-5 GM/200ML-% IV SOLN
1000.0000 mg | Freq: Once | INTRAVENOUS | Status: AC
  Administered 2022-03-20: 1000 mg via INTRAVENOUS
  Filled 2022-03-19: qty 200

## 2022-03-19 MED ORDER — PROTAMINE SULFATE 10 MG/ML IV SOLN
INTRAVENOUS | Status: AC
  Filled 2022-03-19: qty 25

## 2022-03-19 MED ORDER — HEPARIN (PORCINE) IN NACL 1000-0.9 UT/500ML-% IV SOLN
INTRAVENOUS | Status: AC
  Filled 2022-03-19: qty 500

## 2022-03-19 MED ORDER — CEFAZOLIN SODIUM-DEXTROSE 2-4 GM/100ML-% IV SOLN
2.0000 g | INTRAVENOUS | Status: DC
  Filled 2022-03-19: qty 100

## 2022-03-19 MED ORDER — LIDOCAINE HCL (PF) 1 % IJ SOLN
INTRAMUSCULAR | Status: DC | PRN
  Administered 2022-03-19: 2 mL
  Administered 2022-03-19: 10 mL

## 2022-03-19 MED ORDER — SODIUM CHLORIDE 0.9 % WEIGHT BASED INFUSION: 3.0000 mL/kg/h | INTRAVENOUS | Status: DC

## 2022-03-19 MED ORDER — ORAL CARE MOUTH RINSE: 15.0000 mL | OROMUCOSAL | Status: DC | PRN

## 2022-03-19 MED ORDER — OMEGA-3-ACID ETHYL ESTERS 1 G PO CAPS
1.0000 g | ORAL_CAPSULE | Freq: Every day | ORAL | Status: DC
  Administered 2022-03-21 – 2022-04-03 (×14): 1 g via ORAL
  Filled 2022-03-19 (×15): qty 1

## 2022-03-19 MED ORDER — BISACODYL 10 MG RE SUPP: 10.0000 mg | Freq: Every day | RECTAL | Status: DC

## 2022-03-19 MED ORDER — MILRINONE LACTATE IN DEXTROSE 20-5 MG/100ML-% IV SOLN
0.3000 ug/kg/min | INTRAVENOUS | Status: DC
  Filled 2022-03-19: qty 100

## 2022-03-19 MED ORDER — CHLORHEXIDINE GLUCONATE 0.12 % MT SOLN: 15.0000 mL | OROMUCOSAL | Status: AC

## 2022-03-19 MED ORDER — PHENYLEPHRINE 80 MCG/ML (10ML) SYRINGE FOR IV PUSH (FOR BLOOD PRESSURE SUPPORT)
PREFILLED_SYRINGE | INTRAVENOUS | Status: AC
  Filled 2022-03-19: qty 20

## 2022-03-19 MED ORDER — FAMOTIDINE IN NACL 20-0.9 MG/50ML-% IV SOLN
20.0000 mg | Freq: Two times a day (BID) | INTRAVENOUS | Status: AC
  Administered 2022-03-19 – 2022-03-20 (×2): 20 mg via INTRAVENOUS
  Filled 2022-03-19 (×2): qty 50

## 2022-03-19 MED ORDER — 0.9 % SODIUM CHLORIDE (POUR BTL) OPTIME
TOPICAL | Status: DC | PRN
  Administered 2022-03-19: 4000 mL

## 2022-03-19 MED ORDER — CEFAZOLIN SODIUM-DEXTROSE 2-4 GM/100ML-% IV SOLN
2.0000 g | INTRAVENOUS | Status: AC
  Administered 2022-03-19: 2 g via INTRAVENOUS
  Filled 2022-03-19: qty 100

## 2022-03-19 MED ORDER — TRANEXAMIC ACID (OHS) BOLUS VIA INFUSION
15.0000 mg/kg | INTRAVENOUS | Status: AC
  Administered 2022-03-19: 816 mg via INTRAVENOUS
  Filled 2022-03-19: qty 816

## 2022-03-19 MED ORDER — MIDAZOLAM HCL 2 MG/2ML IJ SOLN
INTRAMUSCULAR | Status: DC | PRN
  Administered 2022-03-19: 1 mg via INTRAVENOUS

## 2022-03-19 MED ORDER — VERAPAMIL HCL 2.5 MG/ML IV SOLN: INTRA_ARTERIAL | Status: DC | PRN

## 2022-03-19 MED ORDER — LIDOCAINE HCL (PF) 1 % IJ SOLN
INTRAMUSCULAR | Status: AC
  Filled 2022-03-19: qty 30

## 2022-03-19 MED ORDER — DOCUSATE SODIUM 100 MG PO CAPS: 200.0000 mg | ORAL_CAPSULE | Freq: Every day | ORAL | Status: DC

## 2022-03-19 MED ORDER — PROTAMINE SULFATE 10 MG/ML IV SOLN
INTRAVENOUS | Status: DC | PRN
  Administered 2022-03-19: 190 mg via INTRAVENOUS

## 2022-03-19 MED ORDER — LIDOCAINE 2% (20 MG/ML) 5 ML SYRINGE
INTRAMUSCULAR | Status: AC
  Filled 2022-03-19: qty 5

## 2022-03-19 SURGICAL SUPPLY — 89 items
ANCHOR CATH FOLEY SECURE (MISCELLANEOUS) IMPLANT
BAG DECANTER FOR FLEXI CONT (MISCELLANEOUS) ×2 IMPLANT
BLADE STERNUM SYSTEM 6 (BLADE) ×2 IMPLANT
BNDG ELASTIC 4X5.8 VLCR STR LF (GAUZE/BANDAGES/DRESSINGS) ×2 IMPLANT
BNDG ELASTIC 6X10 VLCR STRL LF (GAUZE/BANDAGES/DRESSINGS) IMPLANT
BNDG ELASTIC 6X5.8 VLCR STR LF (GAUZE/BANDAGES/DRESSINGS) ×2 IMPLANT
BNDG GAUZE DERMACEA FLUFF 4 (GAUZE/BANDAGES/DRESSINGS) ×2 IMPLANT
CANISTER SUCT 3000ML PPV (MISCELLANEOUS) ×2 IMPLANT
CANNULA EZ GLIDE AORTIC 21FR (CANNULA) ×2 IMPLANT
CANNULA GUNDRY RCSP 15FR (MISCELLANEOUS) IMPLANT
CANNULA MC2 2 STG 36/46 NON-V (CANNULA) IMPLANT
CANNULA VENOUS 2 STG 34/46 (CANNULA) ×2
CATH CPB KIT HENDRICKSON (MISCELLANEOUS) ×2 IMPLANT
CATH ROBINSON RED A/P 18FR (CATHETERS) ×2 IMPLANT
CATH THORACIC 36FR (CATHETERS) ×2 IMPLANT
CATH THORACIC 36FR RT ANG (CATHETERS) ×2 IMPLANT
CLIP TI WIDE RED SMALL 24 (CLIP) IMPLANT
CLIP VESOCCLUDE MED 24/CT (CLIP) IMPLANT
CLIP VESOCCLUDE SM WIDE 24/CT (CLIP) IMPLANT
CONTAINER PROTECT SURGISLUSH (MISCELLANEOUS) ×4 IMPLANT
DRAPE CARDIOVASCULAR INCISE (DRAPES) ×2
DRAPE SRG 135X102X78XABS (DRAPES) ×2 IMPLANT
DRAPE WARM FLUID 44X44 (DRAPES) ×2 IMPLANT
DRSG COVADERM 4X14 (GAUZE/BANDAGES/DRESSINGS) ×2 IMPLANT
ELECT REM PT RETURN 9FT ADLT (ELECTROSURGICAL) ×4
ELECTRODE REM PT RTRN 9FT ADLT (ELECTROSURGICAL) ×4 IMPLANT
FELT TEFLON 1X6 (MISCELLANEOUS) ×4 IMPLANT
GAUZE 4X4 16PLY ~~LOC~~+RFID DBL (SPONGE) ×2 IMPLANT
GAUZE SPONGE 4X4 12PLY STRL (GAUZE/BANDAGES/DRESSINGS) ×4 IMPLANT
GLOVE BIOGEL PI IND STRL 6.5 (GLOVE) IMPLANT
GLOVE SS BIOGEL STRL SZ 7.5 (GLOVE) ×2 IMPLANT
GLOVE SURG SIGNA 7.5 PF LTX (GLOVE) ×6 IMPLANT
GLOVE SURG SS PI 6.5 STRL IVOR (GLOVE) IMPLANT
GLOVE SURG SS PI 7.5 STRL IVOR (GLOVE) IMPLANT
GOWN STRL REUS W/ TWL LRG LVL3 (GOWN DISPOSABLE) ×8 IMPLANT
GOWN STRL REUS W/ TWL XL LVL3 (GOWN DISPOSABLE) ×4 IMPLANT
GOWN STRL REUS W/TWL LRG LVL3 (GOWN DISPOSABLE) ×10
GOWN STRL REUS W/TWL XL LVL3 (GOWN DISPOSABLE) ×8
HEMOSTAT POWDER SURGIFOAM 1G (HEMOSTASIS) ×6 IMPLANT
HEMOSTAT SURGICEL 2X14 (HEMOSTASIS) ×2 IMPLANT
INSERT FOGARTY XLG (MISCELLANEOUS) IMPLANT
KIT BASIN OR (CUSTOM PROCEDURE TRAY) ×2 IMPLANT
KIT SUCTION CATH 14FR (SUCTIONS) ×4 IMPLANT
KIT TURNOVER KIT B (KITS) ×2 IMPLANT
KIT VASOVIEW HEMOPRO 2 VH 4000 (KITS) ×2 IMPLANT
MARKER GRAFT CORONARY BYPASS (MISCELLANEOUS) ×6 IMPLANT
NS IRRIG 1000ML POUR BTL (IV SOLUTION) ×10 IMPLANT
PACK E OPEN HEART (SUTURE) ×2 IMPLANT
PACK OPEN HEART (CUSTOM PROCEDURE TRAY) ×2 IMPLANT
PAD ARMBOARD 7.5X6 YLW CONV (MISCELLANEOUS) ×4 IMPLANT
PAD ELECT DEFIB RADIOL ZOLL (MISCELLANEOUS) ×2 IMPLANT
PENCIL BUTTON HOLSTER BLD 10FT (ELECTRODE) ×2 IMPLANT
POSITIONER HEAD DONUT 9IN (MISCELLANEOUS) ×2 IMPLANT
PUNCH AORTIC ROTATE 4.5MM 8IN (MISCELLANEOUS) IMPLANT
SET MPS 3-ND DEL (MISCELLANEOUS) IMPLANT
SPONGE T-LAP 4X18 ~~LOC~~+RFID (SPONGE) ×2 IMPLANT
SUPPORT HEART JANKE-BARRON (MISCELLANEOUS) ×2 IMPLANT
SUT BONE WAX W31G (SUTURE) ×2 IMPLANT
SUT MNCRL AB 4-0 PS2 18 (SUTURE) IMPLANT
SUT PROLENE 3 0 SH DA (SUTURE) ×2 IMPLANT
SUT PROLENE 4 0 RB 1 (SUTURE)
SUT PROLENE 4 0 SH DA (SUTURE) IMPLANT
SUT PROLENE 4-0 RB1 .5 CRCL 36 (SUTURE) IMPLANT
SUT PROLENE 6 0 C 1 30 (SUTURE) ×4 IMPLANT
SUT PROLENE 7 0 BV 1 (SUTURE) IMPLANT
SUT PROLENE 7 0 BV1 MDA (SUTURE) ×2 IMPLANT
SUT PROLENE 8 0 BV175 6 (SUTURE) IMPLANT
SUT STEEL 6MS V (SUTURE) ×2 IMPLANT
SUT STEEL STERNAL CCS#1 18IN (SUTURE) IMPLANT
SUT STEEL SZ 6 DBL 3X14 BALL (SUTURE) ×2 IMPLANT
SUT VIC AB 1 CTX 36 (SUTURE) ×4
SUT VIC AB 1 CTX36XBRD ANBCTR (SUTURE) ×4 IMPLANT
SUT VIC AB 2-0 CT1 27 (SUTURE)
SUT VIC AB 2-0 CT1 36 (SUTURE) IMPLANT
SUT VIC AB 2-0 CT1 TAPERPNT 27 (SUTURE) IMPLANT
SUT VIC AB 2-0 CTX 27 (SUTURE) IMPLANT
SUT VIC AB 3-0 SH 27 (SUTURE)
SUT VIC AB 3-0 SH 27X BRD (SUTURE) IMPLANT
SUT VIC AB 3-0 X1 27 (SUTURE) IMPLANT
SUT VICRYL 4-0 PS2 18IN ABS (SUTURE) IMPLANT
SYSTEM SAHARA CHEST DRAIN ATS (WOUND CARE) ×2 IMPLANT
TAPE CLOTH SURG 4X10 WHT LF (GAUZE/BANDAGES/DRESSINGS) IMPLANT
TAPE PAPER 2X10 WHT MICROPORE (GAUZE/BANDAGES/DRESSINGS) IMPLANT
TOWEL GREEN STERILE (TOWEL DISPOSABLE) ×2 IMPLANT
TOWEL GREEN STERILE FF (TOWEL DISPOSABLE) ×2 IMPLANT
TRAY FOLEY SLVR 16FR TEMP STAT (SET/KITS/TRAYS/PACK) ×2 IMPLANT
TUBING LAP HI FLOW INSUFFLATIO (TUBING) ×2 IMPLANT
UNDERPAD 30X36 HEAVY ABSORB (UNDERPADS AND DIAPERS) ×2 IMPLANT
WATER STERILE IRR 1000ML POUR (IV SOLUTION) ×4 IMPLANT

## 2022-03-19 SURGICAL SUPPLY — 14 items
CATH 5FR JL3.5 JR4 ANG PIG MP (CATHETERS) IMPLANT
CATH IAB 7FR 34ML (CATHETERS) IMPLANT
ELECT DEFIB PAD ADLT CADENCE (PAD) IMPLANT
GLIDESHEATH SLEND A-KIT 6F 22G (SHEATH) IMPLANT
GUIDEWIRE INQWIRE 1.5J.035X260 (WIRE) IMPLANT
INQWIRE 1.5J .035X260CM (WIRE) ×1
KIT HEART LEFT (KITS) ×1 IMPLANT
PACK CARDIAC CATHETERIZATION (CUSTOM PROCEDURE TRAY) ×1 IMPLANT
SHEATH PINNACLE 5F 10CM (SHEATH) IMPLANT
SHEATH PROBE COVER 6X72 (BAG) IMPLANT
TRANSDUCER W/STOPCOCK (MISCELLANEOUS) ×1 IMPLANT
TUBING CIL FLEX 10 FLL-RA (TUBING) ×1 IMPLANT
WIRE EMERALD 3MM-J .035X150CM (WIRE) IMPLANT
WIRE HI TORQ VERSACORE-J 145CM (WIRE) IMPLANT

## 2022-03-19 NOTE — Brief Op Note (Addendum)
03/19/2022  8:37 PM  PATIENT:  Alice [name]  89 y.o. female  PRE-OPERATIVE DIAGNOSIS:  CRITICAL LEFT MAIN CORONARY STENOSIS, ACUTE NON-STEMI  POST-OPERATIVE DIAGNOSIS:  CRITICAL LEFT MAIN CORONARY STENOSIS, ACUTE NON-STEMI  PROCEDURE:   EMERGENCY CORONARY ARTERY BYPASS GRAFTING (CABG) x 2 USING LEFT INTERNAL MAMMARY ARTERY AND RIGHT LEG GREATER SAPHENOUS VEIN HARVESTED ENDOSCOPICALLY   LIMA-LAD SVG-OM  Vein harvest time: 29mn Vein prep time: 281m X Clamp: 36 min CPB time: 63 min  TRANSESOPHAGEAL ECHOCARDIOGRAM (N/A)  SURGEON:  HeMelrose NakayamaMD - Primary  PHYSICIAN ASSISTANT: Roddenberry  ASSISTANTS: HaSammie BenchRN, Scrub Person         PrGaylyn CheersRN, RN First Assistant   ANESTHESIA:   general  EBL:  25091mBLOOD ADMINISTERED: PRBC's x 2 units, 300 ml cell saver  DRAINS:  Left pleural and mediastinal drains    LOCAL MEDICATIONS USED:  NONE  SPECIMEN:  No Specimen  DISPOSITION OF SPECIMEN:  N/A  COUNTS:  Correct  DICTATION: .Dragon Dictation  PLAN OF CARE: Admit to inpatient   PATIENT DISPOSITION:  ICU - intubated and hemodynamically stable.   Delay start of Pharmacological VTE agent (>24hrs) due to surgical blood loss or risk of bleeding: yes

## 2022-03-19 NOTE — Consult Note (Addendum)
Cardiology Consultation   Patient ID: ARITHA HUCKEBA MRN: [id number]; DOB: 1930-10-10  Admit date: 03/19/2022 Date of Consult: 03/19/2022  PCP:  Lauree Chandler, NP   [place] Providers Cardiologist:  None       Patient Profile:   MIGDALIA OLEJNICZAK is a 89 y.o. female with a hx of HLD, prior tobacco abuse, hypothyroidism and history of breast cancer who is being seen 03/19/2022 for the evaluation of chest pain and elevated troponin at the request of Dr. Gustavus Messing.  History of Present Illness:   Ms. Colantonio is a 89 year old female with no significant cardiac history who presents to the ER with chest pain that has been progressing over the past several days. Patient states that about 2 months ago, she began to feel a discomfort in her chest. She states it was mild at first and she did not seek medical attention. Over the course of the past several weeks, the pain has been progressing and became associated with SOB and diaphoresis. She could not relate it to any particular activity but does state it seem to worsen when walking an incline. Over the past 2 days, her symptoms progressed prompting her to come to the ER.  In the ER, trop 116. ECG with subtle STD in the lateral leads. CXR without acute process. She was given nitro and aspirin. Patient is a difficult historian but she does state she thinks the nitro made her feel better as she is "not noticing the chest discomfort now."   Past Medical History:  Diagnosis Date   Alopecia    Breast cancer ([place])     Right , chemo, radiation   DDD (degenerative disc disease)    Heart palpitations    History of diverticulitis    History of diverticulitis of colon    Hypercholesteremia    Hypothyroidism    Insect bites    Internal and external bleeding hemorrhoids 08/11/2015   Personal history of chemotherapy 2012   Personal history of radiation therapy 2012   Scoliosis    L4 L5   Seasonal allergies    SUI (stress urinary  incontinence, female)     Past Surgical History:  Procedure Laterality Date   BLADDER SUSPENSION     bowel rescetion     BOWEL RESECTION     BREAST BIOPSY Right 03/14/2010   BREAST LUMPECTOMY  2012   right   CATARACT EXTRACTION     left   COLON SURGERY  20 YRS AGO   BOWEL RESECTION   EYE SURGERY     HEMORRHOID BANDING     HERNIA REPAIR     RIH, Dr. Rise Patience   JOINT REPLACEMENT     PORT-A-CATH REMOVAL     TONSILLECTOMY  2001   Diverticulitis   TOTAL HIP ARTHROPLASTY Right 09/10/2014   Procedure: RIGHT TOTAL HIP ARTHROPLASTY ANTERIOR APPROACH;  Surgeon: Mcarthur Rossetti, MD;  Location: [facility];  Service: Orthopedics;  Laterality: Right;     Home Medications:  Prior to Admission medications   Medication Sig Start Date End Date Taking? Authorizing Provider  fish oil-omega-3 fatty acids 1000 MG capsule Take 1 g by mouth daily.     [provider]  LORazepam (ATIVAN) 0.5 MG tablet TAKE ONE TABLET BY MOUTH EVERY NIGHT AT BEDTIME AND TAKE 1/2 TABLET BY MOUTH IN THE MIDDLE OF THE NIGHT IF NEEDED 05/05/20   Reed, Tiffany L, DO  Multiple Vitamin (MULTIVITAMIN) tablet Take 1 tablet by mouth daily.  [provider]  SYNTHROID 100 MCG tablet TAKE 1 TABLET DAILY BEFORE BREAKFAST Patient taking differently: Take 100 mcg by mouth daily before breakfast. 01/05/20   Reed, Tiffany L, DO  tamoxifen (NOLVADEX) 20 MG tablet TAKE 1 TABLET DAILY 02/01/20   [name], DO    Inpatient Medications: Scheduled Meds:   morphine injection  4 mg Intravenous Once   ondansetron (ZOFRAN) IV  4 mg Intravenous Once   Continuous Infusions:  PRN Meds:   Allergies:    Allergies  Allergen Reactions   Amoxicillin Rash   Diphenhydramine Rash    "It is the red dye in Benadryl that I''m allergic to"   Naproxen Sodium Rash    "It is the blue dye in naproxen that I'm allergic to."   Lipitor [Atorvastatin]     Unknown reaction   Meclizine Other (See Comments)    Worsening  dizziness and blurred vision   Amlodipine Other (See Comments)    Dizzy   Cefdinir Other (See Comments)    Vertigo and headaches    Codeine Other (See Comments)    unknown   Ferrous Sulfate     "tears belly up"   Methocarbamol Other (See Comments)    insomnia    Prednisone Other (See Comments)    vertigo and headaches   Skelaxin [Metaxalone] Other (See Comments)    Gastric upset.    Tramadol Nausea Only   Hctz [Hydrochlorothiazide] Itching    Social History:   Social History   Socioeconomic History   Marital status: Divorced    Spouse name: Not on file   Number of children: 3   Years of education: Not on file   Highest education level: Not on file  Occupational History   Occupation: Retired    Fish farm manager: RETIRED  Tobacco Use   Smoking status: Former    Types: Cigarettes    Quit date: 08/09/1964    Years since quitting: 32.6   Smokeless tobacco: Never  Vaping Use   Vaping Use: Never used  Substance and Sexual Activity   Alcohol use: Yes    Comment: RARE   Drug use: No   Sexual activity: Not Currently  Other Topics Concern   Not on file  Social History Narrative   Tobacco use, amount per day now: N/A      Past tobacco use, amount per day: 1 pack, Quit 1969      How many years did you use tobacco: N/A      Alcohol use (drinks per week): Occassionally. Glass of wine- Holidays      Diet: N/A      Do you drink/eat things with caffeine? Coffee and Tea      Marital status: Divorced             What year were you married?      Do you live in a house, apartment, assisted living, condo, trailer? Condo      Is it one or more stories? One      How many persons live in your home? Self and Service Dog      Do you have any pets in your home? 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 Walking            How often? Usually daily 2-4 times      Do you have a living will? Yes  Do you have a DNR  form? N/A            If not, do you want to discuss one? N/A      Do you have signed POA/HPOA forms? No                Social Determinants of Health   Financial Resource Strain: Low Risk  (10/14/2017)   Overall Financial Resource Strain (CARDIA)    Difficulty of Paying Living Expenses: Not hard at all  Food Insecurity: No Food Insecurity (10/14/2017)   Hunger Vital Sign    Worried About Running Out of Food in the Last Year: Never true    Ran Out of Food in the Last Year: Never true  Transportation Needs: No Transportation Needs (10/14/2017)   PRAPARE - Hydrologist (Medical): No    Lack of Transportation (Non-Medical): No  Physical Activity: Sufficiently Active (10/14/2017)   Exercise Vital Sign    Days of Exercise per Week: 7 days    Minutes of Exercise per Session: 90 min  Stress: Stress Concern Present (10/14/2017)   [place]    Feeling of Stress : To some extent  Social Connections: Somewhat Isolated (10/14/2017)   Social Connection and Isolation Panel [NHANES]    Frequency of Communication with Friends and Family: More than three times a week    Frequency of Social Gatherings with Friends and Family: Three times a week    Attends Religious Services: Never    Active Member of Clubs or Organizations: Yes    Attends Archivist Meetings: More than 4 times per year    Marital Status: Divorced  Intimate Partner Violence: Not At Risk (10/14/2017)   Humiliation, Afraid, Rape, and Kick questionnaire    Fear of Current or Ex-Partner: No    Emotionally Abused: No    Physically Abused: No    Sexually Abused: No    Family History:    Family History  Problem Relation Age of Onset   Heart disease Mother    Heart disease Father    CVA Father 31   Cancer Sister 51       lymphoma   Asthma Son    Asthma Son    Asthma Son    Skin cancer Brother 38       melanoma   Colon cancer  Other      ROS:  Please see the history of present illness.   All other ROS reviewed and negative.     Physical Exam/Data:   Vitals:   03/19/22 0645 03/19/22 0649 03/19/22 0650 03/19/22 0715  BP: (!) 143/55   (!) 110/52  Pulse: 99 77 74 68  Resp:    (!) 23  Temp:      TempSrc:      SpO2:  95% 94% 97%  Weight:      Height:       No intake or output data in the 24 hours ending 03/19/22 0800    03/19/2022    6:16 AM 02/01/2020   10:00 AM 08/27/2019   12:02 PM  Last 3 Weights  Weight (lbs) 120 lb 128 lb 12.8 oz 129 lb 6.4 oz  Weight (kg) 54.432 kg 58.423 kg 58.695 kg     Body mass index is 21.95 kg/m.  General:  Well nourished, well developed, in no acute distress HEENT: normal Neck: no JVD Vascular: No carotid bruits; Distal pulses  2+ bilaterally Cardiac:  normal S1, S2; RRR; 1/6 systolic murmur Lungs:  clear to auscultation bilaterally, no wheezing, rhonchi or rales  Abd: soft, nontender, no hepatomegaly  Ext: no edema Musculoskeletal:  No deformities, BUE and BLE strength normal and equal Skin: warm and dry  Neuro:  CNs 2-12 intact, no focal abnormalities noted Psych:  Normal affect   EKG:  The EKG was personally reviewed and demonstrates:  NSR with minimal STD in lateral leads Telemetry:  Telemetry was personally reviewed and demonstrates:  NSR  Relevant CV Studies: TTE 2016: Normal LVEF, G1DD, mild TR, mild MR  Laboratory Data:  High Sensitivity Troponin:   Recent Labs  Lab 03/19/22 0628  TROPONINIHS 116*     Chemistry Recent Labs  Lab 03/19/22 0628  NA 138  K 3.8  CL 100  CO2 27  GLUCOSE 123*  BUN 29*  CREATININE 0.88  CALCIUM 9.2  GFRNONAA >60  ANIONGAP 11    No results for input(s): "PROT", "ALBUMIN", "AST", "ALT", "ALKPHOS", "BILITOT" in the last 168 hours. Lipids No results for input(s): "CHOL", "TRIG", "HDL", "LABVLDL", "LDLCALC", "CHOLHDL" in the last 168 hours.  Hematology Recent Labs  Lab 03/19/22 0628  WBC 9.1  RBC 4.50  HGB  12.5  HCT 40.0  MCV 88.9  MCH 27.8  MCHC 31.3  RDW 15.3  PLT 252   Thyroid No results for input(s): "TSH", "FREET4" in the last 168 hours.  BNPNo results for input(s): "BNP", "PROBNP" in the last 168 hours.  DDimer No results for input(s): "DDIMER" in the last 168 hours.   Radiology/Studies:  DG Chest 2 View  Result Date: 03/19/2022 CLINICAL DATA:  Chest pain EXAM: CHEST - 2 VIEW COMPARISON:  11/29/2011 FINDINGS: In lobulation of the right diaphragm attributed to eventration. Normal heart size and aortic contours accounting for rotation. Small hiatal hernia. Generous lung volumes. There is no edema, consolidation, effusion, or pneumothorax. Extensive artifact from EKG leads. IMPRESSION: Stable exam.  No evidence of acute disease. Electronically Signed   By: Jorje Guild M.D.   On: 03/19/2022 06:49     Assessment and Plan:   #NSTEMI: Patient presents with progressive chest discomfort with associated diaphoresis and SOB over the past 2 months that acutely worsened over the past 48h. Initial trop 116. ECG with NSR with subtle STD in the lateral leads. TTE pending. History concerning for ACS. Will plan for cath today.  -Plan for LHC today -Check TTE -S/p ASA '324mg'$ ; continue ASA '81mg'$  tomorrow -Start crestor '20mg'$  daily -Start low dose metop 12.'5mg'$  XL daily -Will add ACE/ARB as needed post cath -Add-on lipid panel and A1C for risk stratification -Plan discussed at length with the patient and her son, Georgianne Fick  INFORMED CONSENT: I have reviewed the risks, indications, and alternatives to cardiac catheterization, possible angioplasty, and stenting with the patient. Risks include but are not limited to bleeding, infection, vascular injury, stroke, myocardial infection, arrhythmia, kidney injury, radiation-related injury in the case of prolonged fluoroscopy use, emergency cardiac surgery, and death. The patient understands the risks of serious complication is 1-2 in 3762 with diagnostic  cardiac cath and 1-2% or less with angioplasty/stenting.     Risk Assessment/Risk Scores:   TIMI Risk Score for Unstable Angina or Non-ST Elevation MI:   The patient's TIMI risk score is 4, which indicates a 20% risk of all cause mortality, new or recurrent myocardial infarction or need for urgent revascularization in the next 14 days.{  For questions or updates, please contact [place] Please consult [email or web address] for contact info under    Signed, Freada Bergeron, MD  03/19/2022 8:00 AM

## 2022-03-19 NOTE — Anesthesia Procedure Notes (Signed)
Procedure Name: Intubation Date/Time: 03/19/2022 5:52 PM  Performed by: Reece Agar, CRNAPre-anesthesia Checklist: Patient identified, Emergency Drugs available, Suction available and Patient being monitored Patient Re-evaluated:Patient Re-evaluated prior to induction Oxygen Delivery Method: Circle System Utilized Preoxygenation: Pre-oxygenation with 100% oxygen Induction Type: IV induction Ventilation: Mask ventilation without difficulty Laryngoscope Size: Mac and 3 Grade View: Grade II Tube type: Oral Tube size: 8.0 mm Number of attempts: 1 Airway Equipment and Method: Stylet Placement Confirmation: ETT inserted through vocal cords under direct vision, positive ETCO2 and breath sounds checked- equal and bilateral Secured at: 22 cm Tube secured with: Tape Dental Injury: Teeth and Oropharynx as per pre-operative assessment

## 2022-03-19 NOTE — ED Notes (Signed)
Pt denies chest discomfort at present and declined pain meds at this time

## 2022-03-19 NOTE — Progress Notes (Signed)
  Echocardiogram 2D Echocardiogram has been performed.  Eartha Inch 03/19/2022, 12:19 PM

## 2022-03-19 NOTE — Anesthesia Procedure Notes (Signed)
Central Venous Catheter Insertion Performed by: Roderic Palau, MD, anesthesiologist Start/End1/22/2024 5:35 PM, 03/19/2022 5:50 PM Patient location: Pre-op. Preanesthetic checklist: patient identified, IV checked, site marked, risks and benefits discussed, surgical consent, monitors and equipment checked, pre-op evaluation, timeout performed and anesthesia consent Position: Trendelenburg Lidocaine 1% used for infiltration and patient sedated Hand hygiene performed , maximum sterile barriers used  and Seldinger technique used Catheter size: 9 Fr Total catheter length 10. Central line was placed.MAC introducer Procedure performed using ultrasound guided technique. Ultrasound Notes:anatomy identified, needle tip was noted to be adjacent to the nerve/plexus identified, no ultrasound evidence of intravascular and/or intraneural injection and image(s) printed for medical record Attempts: 1 Following insertion, line sutured, dressing applied and Biopatch. Post procedure assessment: blood return through all ports, free fluid flow and no air  Patient tolerated the procedure well with no immediate complications.

## 2022-03-19 NOTE — Progress Notes (Addendum)
Note, patient was previously a DNR during July 4th 2021 admission. She is currently admitted with NSTEMI. I discussed with the patient regarding code status. She wish to be full code for now. She is aware that if she stops breathing, she is consenting for [place] to do full resuscitation including chest compression, ACLS drugs, intubation and defibrillation. Nurse was present during code discussion.

## 2022-03-19 NOTE — ED Notes (Signed)
Patient transported to X-ray 

## 2022-03-19 NOTE — ED Notes (Signed)
Pt initially stated her CP was still an 8/10.This RN offered pt another nitro and pt stated the pain was now subsiding. Will continue to monitor.

## 2022-03-19 NOTE — Anesthesia Procedure Notes (Signed)
Central Venous Catheter Insertion Performed by: Roderic Palau, MD, anesthesiologist Start/End1/22/2024 5:35 PM, 03/19/2022 5:50 PM Patient location: Pre-op. Preanesthetic checklist: patient identified, IV checked, site marked, risks and benefits discussed, surgical consent, monitors and equipment checked, pre-op evaluation, timeout performed and anesthesia consent Hand hygiene performed  and maximum sterile barriers used  PA cath was placed.Swan type:thermodilution PA Cath depth:50 Procedure performed without using ultrasound guided technique. Attempts: 1 Patient tolerated the procedure well with no immediate complications.

## 2022-03-19 NOTE — Consult Note (Signed)
Reason for Consult:Left main CAD Referring Physician: Dr. Karna Christmas is an 89 y.o. female.  HPI: 89 yo woman with PMH listed below presented with unstable CP and r/i for nonSTEMI.  In cath lab today found to have critical left main disease.  Had pain requiring IABP placement.  Currently pain resolved.  Past Medical History:  Diagnosis Date   Alopecia    Breast cancer ([place])     Right , chemo, radiation   DDD (degenerative disc disease)    Heart palpitations    History of diverticulitis    History of diverticulitis of colon    Hypercholesteremia    Hypothyroidism    Insect bites    Internal and external bleeding hemorrhoids 08/11/2015   Personal history of chemotherapy 2012   Personal history of radiation therapy 2012   Scoliosis    L4 L5   Seasonal allergies    SUI (stress urinary incontinence, female)     Past Surgical History:  Procedure Laterality Date   BLADDER SUSPENSION     bowel rescetion     BOWEL RESECTION     BREAST BIOPSY Right 03/14/2010   BREAST LUMPECTOMY  2012   right   CATARACT EXTRACTION     left   COLON SURGERY  20 YRS AGO   BOWEL RESECTION   EYE SURGERY     HEMORRHOID BANDING     HERNIA REPAIR     RIH, Dr. Rise Patience   JOINT REPLACEMENT     PORT-A-CATH REMOVAL     TONSILLECTOMY  2001   Diverticulitis   TOTAL HIP ARTHROPLASTY Right 09/10/2014   Procedure: RIGHT TOTAL HIP ARTHROPLASTY ANTERIOR APPROACH;  Surgeon: Mcarthur Rossetti, MD;  Location: [facility];  Service: Orthopedics;  Laterality: Right;    Family History  Problem Relation Age of Onset   Heart disease Mother    Heart disease Father    CVA Father 44   Cancer Sister 38       lymphoma   Asthma Son    Asthma Son    Asthma Son    Skin cancer Brother 69       melanoma   Colon cancer Other     Social History:  reports that she quit smoking about 57 years ago. She has never used smokeless tobacco. She reports current alcohol use. She reports that she does not use  drugs.  Allergies:  Allergies  Allergen Reactions   Amoxicillin Rash   Diphenhydramine Rash    "It is the red dye in Benadryl that I''m allergic to"   Naproxen Sodium Rash    "It is the blue dye in naproxen that I'm allergic to."   Lipitor [Atorvastatin]     Unknown reaction   Meclizine Other (See Comments)    Worsening dizziness and blurred vision   Amlodipine Other (See Comments)    Dizzy   Cefdinir Other (See Comments)    Vertigo and headaches    Codeine Other (See Comments)    unknown   Ferrous Sulfate     "tears belly up"   Methocarbamol Other (See Comments)    insomnia    Prednisone Other (See Comments)    vertigo and headaches   Skelaxin [Metaxalone] Other (See Comments)    Gastric upset.    Tramadol Nausea Only   Hctz [Hydrochlorothiazide] Itching    Medications: Scheduled:  epinephrine  0-10 mcg/min Intravenous To OR   heparin sodium (porcine) 2,500 Units, papaverine 30 mg in electrolyte-A (PLASMALYTE-A  PH 7.4) 500 mL irrigation   Irrigation To OR   insulin   Intravenous To OR   [place] Blood Cardioplegia vial (lidocaine/magnesium/mannitol 0.26g-4g-6.4g)   Intracoronary To OR   [MAR Hold]  morphine injection  4 mg Intravenous Once   [MAR Hold] ondansetron (ZOFRAN) IV  4 mg Intravenous Once   phenylephrine  30-200 mcg/min Intravenous To OR   potassium chloride  80 mEq Other To OR   sodium chloride flush  3 mL Intravenous Q12H   tranexamic acid  15 mg/kg Intravenous To OR   tranexamic acid  2 mg/kg Intracatheter To OR    Results for orders placed or performed during the hospital encounter of 03/19/22 (from the past 48 hour(s))  Basic metabolic panel     Status: Abnormal   Collection Time: 03/19/22  6:28 AM  Result Value Ref Range   Sodium 138 135 - 145 mmol/L   Potassium 3.8 3.5 - 5.1 mmol/L   Chloride 100 98 - 111 mmol/L   CO2 27 22 - 32 mmol/L   Glucose, Bld 123 (H) 70 - 99 mg/dL    Comment: Glucose reference range applies only to samples taken  after fasting for at least 8 hours.   BUN 29 (H) 8 - 23 mg/dL   Creatinine, Ser 0.88 0.44 - 1.00 mg/dL   Calcium 9.2 8.9 - 10.3 mg/dL   GFR, Estimated >60 >60 mL/min    Comment: (NOTE) Calculated using the CKD-EPI Creatinine Equation (2021)    Anion gap 11 5 - 15    Comment: Performed at [facility] [address] [place], [place] [address]  Troponin I (High Sensitivity)     Status: Abnormal   Collection Time: 03/19/22  6:28 AM  Result Value Ref Range   Troponin I (High Sensitivity) 116 (HH) <18 ng/L    Comment: CRITICAL RESULT CALLED TO, READ BACK BY AND VERIFIED WITH H.VAN Lynwood Dawley [id number] 03/19/22 CLARK,S (NOTE) Elevated high sensitivity troponin I (hsTnI) values and significant  changes across serial measurements may suggest ACS but many other  chronic and acute conditions are known to elevate hsTnI results.  Refer to the "Links" section for chest pain algorithms and additional  guidance. Performed at [facility], [place] [address], [place], [place] [address]   CBC with Differential     Status: None   Collection Time: 03/19/22  6:28 AM  Result Value Ref Range   WBC 9.1 4.0 - 10.5 K/uL   RBC 4.50 3.87 - 5.11 MIL/uL   Hemoglobin 12.5 12.0 - 15.0 g/dL   HCT 40.0 36.0 - 46.0 %   MCV 88.9 80.0 - 100.0 fL   MCH 27.8 26.0 - 34.0 pg   MCHC 31.3 30.0 - 36.0 g/dL   RDW 15.3 11.5 - 15.5 %   Platelets 252 150 - 400 K/uL   nRBC 0.0 0.0 - 0.2 %   Neutrophils Relative % 53 %   Neutro Abs 4.9 1.7 - 7.7 K/uL   Lymphocytes Relative 37 %   Lymphs Abs 3.4 0.7 - 4.0 K/uL   Monocytes Relative 7 %   Monocytes Absolute 0.7 0.1 - 1.0 K/uL   Eosinophils Relative 2 %   Eosinophils Absolute 0.1 0.0 - 0.5 K/uL   Basophils Relative 1 %   Basophils Absolute 0.1 0.0 - 0.1 K/uL   Immature Granulocytes 0 %   Abs Immature Granulocytes 0.01 0.00 - 0.07 K/uL    Comment: Performed at [facility], [address], [place], [place] [address]  Hemoglobin A1c     Status: Abnormal    Collection Time: 03/19/22  6:28 AM  Result Value Ref Range   Hgb A1c MFr Bld 5.7 (H) 4.8 - 5.6 %    Comment: (NOTE) Pre diabetes:          5.7%-6.4%  Diabetes:              >6.4%  Glycemic control for   <7.0% adults with diabetes    Mean Plasma Glucose 116.89 mg/dL    Comment: Performed at [facility] [address] [place], [place] [address]  Troponin I (High Sensitivity)     Status: Abnormal   Collection Time: 03/19/22  8:15 AM  Result Value Ref Range   Troponin I (High Sensitivity) 126 (HH) <18 ng/L    Comment: CRITICAL VALUE NOTED. VALUE IS CONSISTENT WITH PREVIOUSLY REPORTED/CALLED VALUE (NOTE) Elevated high sensitivity troponin I (hsTnI) values and significant  changes across serial measurements may suggest ACS but many other  chronic and acute conditions are known to elevate hsTnI results.  Refer to the "Links" section for chest pain algorithms and additional  guidance. Performed at [facility], [place] [address], [place], [place] [address]   Lipid panel     Status: Abnormal   Collection Time: 03/19/22  8:15 AM  Result Value Ref Range   Cholesterol 238 (H) 0 - 200 mg/dL   Triglycerides 42 <150 mg/dL   HDL 80 >40 mg/dL   Total CHOL/HDL Ratio 3.0 RATIO   VLDL 8 0 - 40 mg/dL   LDL Cholesterol 150 (H) 0 - 99 mg/dL    Comment:        Total Cholesterol/HDL:CHD Risk Coronary Heart Disease Risk Table                     Men   Women  1/2 Average Risk   3.4   3.3  Average Risk       5.0   4.4  2 X Average Risk   9.6   7.1  3 X Average Risk  23.4   11.0        Use the calculated Patient Ratio above and the CHD Risk Table to determine the patient's CHD Risk.        ATP III CLASSIFICATION (LDL):  <100     mg/dL   Optimal  100-129  mg/dL   Near or Above                    Optimal  130-159  mg/dL   Borderline  160-189  mg/dL   High  >190     mg/dL   Very High Performed at [facility] [address] [place], [place] [address]     CARDIAC  CATHETERIZATION  Result Date: 03/19/2022 Images from the original result were not included.   Mid LM to Dist LM lesion is 99% stenosed.   Prox RCA lesion is 90% stenosed.   Mid LAD lesion is 60% stenosed. Kristal SHRINIKA BLATZ is a 89 y.o. female  [id number] LOCATION:  FACILITY: [facility] PHYSICIAN: Quay Burow, M.D. 1930-08-19 DATE OF PROCEDURE:  03/19/2022 DATE OF DISCHARGE: CARDIAC CATHETERIZATION History obtained from chart review.Josephyne CALINA PATRIE is a 89 y.o. female with a hx of HLD, prior tobacco abuse, hypothyroidism and history of breast cancer who is being seen 03/19/2022 for the evaluation of chest pain and elevated troponin at the  request of Dr. Gustavus Messing. Her troponins were not mildly elevated and she had subtle lateral ST segment depression.  She was seen by Dr. Johney Frame who felt that she should undergo coronary angiography to define her anatomy.   Ms. Tirey has left main/three-vessel disease.  She is having active angina.  I placed an intra-aortic balloon pump.  I bolused her with 7000 units of heparin and placed her on a heparin drip.  Dr. Roxan Hockey has seen her and has agreed to take her for coronary artery bypass graft grafting urgently. Quay Burow. MD, [facility] 03/19/2022 4:30 PM    ECHOCARDIOGRAM COMPLETE  Result Date: 03/19/2022    ECHOCARDIOGRAM REPORT   Patient Name:   BIRGITTA UHLIR [facility] Date of Exam: 03/19/2022 Medical Rec #:  [id number]       Height:       62.0 in Accession #:    [id number]      Weight:       120.0 lb Date of Birth:  January 23, 1931        BSA:          1.539 m Patient Age:    28 years        BP:           137/61 mmHg Patient Gender: F               HR:           74 bpm. Exam Location:  Inpatient Procedure: 2D Echo, Cardiac Doppler, Color Doppler and Intracardiac            Opacification Agent Indications:    Chest Pain  History:        Patient has no prior history of Echocardiogram examinations.                 Signs/Symptoms:Shortness of Breath and Chest Pain; Risk                  Factors:Dyslipidemia and Former Smoker. Hx of breast cancer.  Sonographer:    Eartha Inch Referring Phys: [id number] HEATHER E PEMBERTON  Sonographer Comments: Image acquisition challenging due to patient body habitus and Image acquisition challenging due to respiratory motion. IMPRESSIONS  1. Left ventricular ejection fraction, by estimation, is 60 to 65%. The left ventricle has normal function. The left ventricle has no regional wall motion abnormalities. Left ventricular diastolic parameters are consistent with Grade I diastolic dysfunction (impaired relaxation).  2. Right ventricular systolic function is normal. The right ventricular size is normal. There is mildly elevated pulmonary artery systolic pressure.  3. Left atrial size was mildly dilated.  4. Large calcium deposit on PMVL.Marland Kitchen The mitral valve is degenerative. Mild mitral valve regurgitation. No evidence of mitral stenosis. Moderate mitral annular calcification.  5. The aortic valve is normal in structure. Aortic valve regurgitation is not visualized. Aortic valve sclerosis is present, with no evidence of aortic valve stenosis. Aortic valve area, by VTI measures 1.44 cm. Aortic valve mean gradient measures 8.0 mmHg. Aortic valve Vmax measures 1.81 m/s.  6. The inferior vena cava is dilated in size with >50% respiratory variability, suggesting right atrial pressure of 8 mmHg. FINDINGS  Left Ventricle: Left ventricular ejection fraction, by estimation, is 60 to 65%. The left ventricle has normal function. The left ventricle has no regional wall motion abnormalities. The left ventricular internal cavity size was normal in size. There is  no left ventricular hypertrophy. Left ventricular diastolic parameters are consistent with Grade I diastolic dysfunction (impaired  relaxation). Indeterminate filling pressures. Right Ventricle: The right ventricular size is normal. No increase in right ventricular wall thickness. Right ventricular systolic function is  normal. There is mildly elevated pulmonary artery systolic pressure. The tricuspid regurgitant velocity is 2.71  m/s, and with an assumed right atrial pressure of 8 mmHg, the estimated right ventricular systolic pressure is 91.4 mmHg. Left Atrium: Left atrial size was mildly dilated. Right Atrium: Right atrial size was normal in size. Pericardium: There is no evidence of pericardial effusion. Mitral Valve: Large calcium deposit on PMVL. The mitral valve is degenerative in appearance. There is mild thickening of the mitral valve leaflet(s). Moderate mitral annular calcification. Mild mitral valve regurgitation. No evidence of mitral valve stenosis. MV peak gradient, 7.6 mmHg. The mean mitral valve gradient is 4.0 mmHg. Tricuspid Valve: The tricuspid valve is normal in structure. Tricuspid valve regurgitation is mild . No evidence of tricuspid stenosis. Aortic Valve: The aortic valve is normal in structure. Aortic valve regurgitation is not visualized. Aortic valve sclerosis is present, with no evidence of aortic valve stenosis. Aortic valve mean gradient measures 8.0 mmHg. Aortic valve peak gradient measures 13.1 mmHg. Aortic valve area, by VTI measures 1.44 cm. Pulmonic Valve: The pulmonic valve was normal in structure. Pulmonic valve regurgitation is not visualized. No evidence of pulmonic stenosis. Aorta: The aortic root and ascending aorta are structurally normal, with no evidence of dilitation. Venous: The inferior vena cava is dilated in size with greater than 50% respiratory variability, suggesting right atrial pressure of 8 mmHg. IAS/Shunts: No atrial level shunt detected by color flow Doppler.  LEFT VENTRICLE PLAX 2D LVIDd:         4.40 cm     Diastology LVIDs:         3.50 cm     LV e' medial:    4.90 cm/s LV PW:         0.60 cm     LV E/e' medial:  21.6 LV IVS:        0.40 cm     LV e' lateral:   8.59 cm/s LVOT diam:     1.70 cm     LV E/e' lateral: 12.3 LV SV:         58 LV SV Index:   37 LVOT Area:      2.27 cm  LV Volumes (MOD) LV vol d, MOD A2C: 61.7 ml LV vol d, MOD A4C: 64.4 ml LV vol s, MOD A2C: 25.4 ml LV vol s, MOD A4C: 22.3 ml LV SV MOD A2C:     36.3 ml LV SV MOD A4C:     64.4 ml LV SV MOD BP:      42.6 ml RIGHT VENTRICLE             IVC RV S prime:     13.80 cm/s  IVC diam: 2.10 cm TAPSE (M-mode): 1.5 cm LEFT ATRIUM             Index        RIGHT ATRIUM           Index LA diam:        4.00 cm 2.60 cm/m   RA Area:     10.20 cm LA Vol (A2C):   50.6 ml 32.89 ml/m  RA Volume:   19.20 ml  12.48 ml/m LA Vol (A4C):   49.8 ml 32.37 ml/m LA Biplane Vol: 50.7 ml 32.95 ml/m  AORTIC VALVE AV Area (Vmax):    1.27  cm AV Area (Vmean):   1.28 cm AV Area (VTI):     1.44 cm AV Vmax:           181.00 cm/s AV Vmean:          141.000 cm/s AV VTI:            0.401 m AV Peak Grad:      13.1 mmHg AV Mean Grad:      8.0 mmHg LVOT Vmax:         101.00 cm/s LVOT Vmean:        79.600 cm/s LVOT VTI:          0.254 m LVOT/AV VTI ratio: 0.63  AORTA Ao Root diam: 2.90 cm Ao Asc diam:  2.90 cm MITRAL VALVE                  TRICUSPID VALVE MV Area (PHT): 2.65 cm       TR Peak grad:   29.4 mmHg MV Area VTI:   1.43 cm       TR Mean grad:   18.0 mmHg MV Peak grad:  7.6 mmHg       TR Vmax:        271.00 cm/s MV Mean grad:  4.0 mmHg       TR Vmean:       203.0 cm/s MV Vmax:       1.38 m/s MV Vmean:      92.3 cm/s      SHUNTS MV Decel Time: 286 msec       Systemic VTI:  0.25 m MR Peak grad:    89.1 mmHg    Systemic Diam: 1.70 cm MR Mean grad:    64.0 mmHg MR Vmax:         472.00 cm/s MR Vmean:        383.0 cm/s MR PISA:         0.57 cm MR PISA Eff ROA: 5 mm MR PISA Radius:  0.30 cm MV E velocity: 106.00 cm/s MV A velocity: 128.00 cm/s MV E/A ratio:  0.83 Skeet Latch MD Electronically signed by Skeet Latch MD Signature Date/Time: 03/19/2022/3:47:36 PM    Final    DG Chest 2 View  Result Date: 03/19/2022 CLINICAL DATA:  Chest pain EXAM: CHEST - 2 VIEW COMPARISON:  11/29/2011 FINDINGS: In lobulation of the right diaphragm  attributed to eventration. Normal heart size and aortic contours accounting for rotation. Small hiatal hernia. Generous lung volumes. There is no edema, consolidation, effusion, or pneumothorax. Extensive artifact from EKG leads. IMPRESSION: Stable exam.  No evidence of acute disease. Electronically Signed   By: Jorje Guild M.D.   On: 03/19/2022 06:49     I personally reviewed the cath images- critical left main disease  Review of Systems Blood pressure (!) 158/122, pulse 74, temperature 97.8 F (36.6 C), temperature source Oral, resp. rate (!) 24, height '5\' 2"'$  (1.575 m), weight 54.4 kg, SpO2 90 %. Physical Exam Vitals reviewed.  Constitutional:      General: She is not in acute distress.    Comments: elderly  HENT:     Head: Normocephalic and atraumatic.  Eyes:     General: No scleral icterus.    Extraocular Movements: Extraocular movements intact.  Neck:     Vascular: No carotid bruit.  Cardiovascular:     Rate and Rhythm: Normal rate and regular rhythm.     Heart sounds: Murmur (faint systolic) heard.  Comments: Feet warm but no palpable pulses Pulmonary:     Effort: Pulmonary effort is normal. No respiratory distress.     Breath sounds: Normal breath sounds. No wheezing.  Abdominal:     General: There is no distension.     Palpations: Abdomen is soft.  Skin:    General: Skin is warm and dry.  Neurological:     General: No focal deficit present.     Mental Status: She is alert and oriented to person, place, and time.     Cranial Nerves: No cranial nerve deficit.     Assessment/Plan: 89 yo woman with non STEMI found to have critical left main disease, unstable during cath requiring IABP placement.  CABG indicated for survival benefit and relief of symptoms.  I discussed the general nature of the procedure, including the need for general anesthesia, the incisions to be used, the use of cardiopulmonary bypass, and the use of drainage tubes and temporary pacemaker  wires postoperatively with Mrs. Pietila.  We briefly discussed the expected hospital stay, overall recovery and short and long term outcomes. I informed her of the indications, risks, benefits and alternatives.  She understands the risks include but are not limited to death, stroke, MI, DVT/PE, bleeding, possible need for transfusion, infections, cardiac arrhythmias, as well as other organ system dysfunction including respiratory, renal, or GI complications.   She accepts the risks and agrees to proceed.   OR notified and prep in progress  Melrose Nakayama 03/19/2022, 4:41 PM

## 2022-03-19 NOTE — ED Provider Notes (Signed)
7:14 AM Care assumed from Dr. Roxanne Mins.  At time of transfer of care, patient is awaiting completion of her medical workup including troponin to determine etiology of chest discomfort.  Given concerning description of symptoms and EKG, anticipate discussion with cardiology after workup is completed.  7:34 AM Was informed that troponin is now elevated over 100.  Will trend and call cardiology for suspected NSTEMI.  Cardiology will come see patient for further management of suspected NSTEMI.   Clinical Impression: 1. Nonspecific chest pain   2. Elevated blood pressure reading without diagnosis of hypertension   3. NSTEMI (non-ST elevated myocardial infarction) [facility])     Disposition: Admit  This note was prepared with assistance of Dragon voice recognition software. Occasional wrong-word or sound-a-like substitutions may have occurred due to the inherent limitations of voice recognition software.     Alice [name], Gwenyth Allegra, MD 03/19/22 [phone]

## 2022-03-19 NOTE — H&P (Signed)
Please see consult note from today that will serve as patient's H and P.  Gwyndolyn Kaufman, MD

## 2022-03-19 NOTE — Transfer of Care (Signed)
Immediate Anesthesia Transfer of Care Note  Patient: [name]  Procedure(s) Performed: CORONARY ARTERY BYPASS GRAFTING (CABG) X TWO, USING LEFT INTERNAL MAMMARY ARTERY AND RIGHT LEG GREATER SAPHENOUS VEIN HARVESTED ENDOSCOPICALLY (Chest) TRANSESOPHAGEAL ECHOCARDIOGRAM  Patient Location: ICU  Anesthesia Type:General  Level of Consciousness: Patient remains intubated per anesthesia plan  Airway & Oxygen Therapy: Patient remains intubated per anesthesia plan and Patient placed on Ventilator (see vital sign flow sheet for setting)  Post-op Assessment: Report given to RN, Post -op Vital signs reviewed and stable, and Pt continues to have large discrepancy between IABP and Arterial Line. MAPs tend to correlate, Dr. Roxan Hockey aware.    Post vital signs: Reviewed and stable  Last Vitals:  Vitals Value Taken Time  BP 141/39 03/19/22 2215  Temp 35.3 C 03/19/22 2229  Pulse 80 03/19/22 2229  Resp 12 03/19/22 2229  SpO2 100 % 03/19/22 2229  Vitals shown include unvalidated device data.  Last Pain:  Vitals:   03/19/22 1618  TempSrc:   PainSc: 0-No pain         Complications: No notable events documented.

## 2022-03-19 NOTE — ED Notes (Signed)
Sats 88-89% RA while sleeping; pt placed on 2L [place]

## 2022-03-19 NOTE — ED Provider Notes (Signed)
[facility] Provider Note   CSN: [id number] Arrival date & time: 03/19/22  0865     History  Chief Complaint  Patient presents with   Chest Pain    Alice [name] is a 89 y.o. female.  The history is provided by the patient.  Chest Pain She has history of hyperlipidemia, breast cancer and comes in because of chest pain which started yesterday afternoon.  She describes a squeezing and dull feeling in her chest on the left side with associated dyspnea and diaphoresis but no nausea or vomiting.  Discomfort has been constant.  She was brought in by ambulance where she received aspirin and nitroglycerin.  She does not think the nitroglycerin helped her discomfort at all.  She is a former smoker having quit 40 years ago.  She denies history of hypertension or diabetes and she denies family history of premature coronary atherosclerosis.   Home Medications Prior to Admission medications   Medication Sig Start Date End Date Taking? Authorizing Provider  fish oil-omega-3 fatty acids 1000 MG capsule Take 1 g by mouth daily.     [provider]  LORazepam (ATIVAN) 0.5 MG tablet TAKE ONE TABLET BY MOUTH EVERY NIGHT AT BEDTIME AND TAKE 1/2 TABLET BY MOUTH IN THE MIDDLE OF THE NIGHT IF NEEDED 05/05/20   Reed, Tiffany L, DO  Multiple Vitamin (MULTIVITAMIN) tablet Take 1 tablet by mouth daily.    [provider]  SYNTHROID 100 MCG tablet TAKE 1 TABLET DAILY BEFORE BREAKFAST Patient taking differently: Take 100 mcg by mouth daily before breakfast. 01/05/20   Reed, Tiffany L, DO  tamoxifen (NOLVADEX) 20 MG tablet TAKE 1 TABLET DAILY 02/01/20   Reed, Tiffany L, DO      Allergies    Amoxicillin, Diphenhydramine, Naproxen sodium, Lipitor [atorvastatin], Meclizine, Amlodipine, Cefdinir, Codeine, Ferrous sulfate, Methocarbamol, Prednisone, Skelaxin [metaxalone], Tramadol, and Hctz [hydrochlorothiazide]    Review of Systems   Review of Systems   Cardiovascular:  Positive for chest pain.  All other systems reviewed and are negative.   Physical Exam Updated Vital Signs BP (!) 149/82   Pulse 77   Temp 97.6 F (36.4 C) (Oral)   Resp 17   Ht '5\' 2"'$  (1.575 m)   Wt 54.4 kg   SpO2 95%   BMI 21.95 kg/m  Physical Exam Vitals and nursing note reviewed.   89 year old female, resting comfortably and in no acute distress. Vital signs are significant for mildly elevated blood pressure. Oxygen saturation is 95%, which is normal. Head is normocephalic and atraumatic. PERRLA, EOMI. Oropharynx is clear. Neck is nontender and supple without adenopathy or JVD. Back is nontender and there is no CVA tenderness. Lungs are clear without rales, wheezes, or rhonchi. Chest is nontender. Heart has regular rate and rhythm without murmur. Abdomen is soft, flat, nontender. Extremities have no cyanosis or edema, full range of motion is present. Skin is warm and dry without rash. Neurologic: Mental status is normal, cranial nerves are intact, moves all extremities equally.  ED Results / Procedures / Treatments   Labs (all labs ordered are listed, but only abnormal results are displayed) Labs Reviewed  BASIC METABOLIC PANEL  CBC WITH DIFFERENTIAL/PLATELET  TROPONIN I (HIGH SENSITIVITY)    EKG EKG Interpretation  Date/Time:  Monday March 19 2022 06:17:12 EST Ventricular Rate:  84 PR Interval:  168 QRS Duration: 100 QT Interval:  381 QTC Calculation: 451 R Axis:   51 Text Interpretation: Sinus  rhythm Probable left atrial enlargement Borderline ST depression, anterolateral leads When compared with ECG of 10/09/2010, Nonspecific ST abnormality is now present Confirmed by Delora Fuel ([id number]) on 03/19/2022 6:25:25 AM  Radiology DG Chest 2 View  Result Date: 03/19/2022 CLINICAL DATA:  Chest pain EXAM: CHEST - 2 VIEW COMPARISON:  11/29/2011 FINDINGS: In lobulation of the right diaphragm attributed to eventration. Normal heart size and aortic  contours accounting for rotation. Small hiatal hernia. Generous lung volumes. There is no edema, consolidation, effusion, or pneumothorax. Extensive artifact from EKG leads. IMPRESSION: Stable exam.  No evidence of acute disease. Electronically Signed   By: Jorje Guild M.D.   On: 03/19/2022 06:49    Procedures Procedures  Cardiac monitor shows normal sinus rhythm, per my interpretation.  Medications Ordered in ED Medications  morphine (PF) 4 MG/ML injection 4 mg (has no administration in time range)  ondansetron (ZOFRAN) injection 4 mg (has no administration in time range)    ED Course/ Medical Decision Making/ A&P                             Medical Decision Making Amount and/or Complexity of Data Reviewed Labs: ordered. Radiology: ordered.  Risk Prescription drug management.   Chest pain concerning for ACS.  Differential diagnosis also includes pneumonia, GERD, pulmonary embolism, musculoskeletal pain.  I have reviewed and interpreted her electrocardiogram, and my interpretation is mild ST depression in the anterolateral leads which is new compared with prior.  Chest x-ray shows no acute cardiopulmonary process.  Have independently viewed the images, and agree with the radiologist's interpretation.  I have ordered morphine for pain.  I have ordered laboratory workup of CBC, basic metabolic panel, troponin.  Case is signed out to Dr. Sherry Ruffing, oncoming physician.  Final Clinical Impression(s) / ED Diagnoses Final diagnoses:  Nonspecific chest pain  Elevated blood pressure reading without diagnosis of hypertension    Rx / DC Orders ED Discharge Orders     None         Delora Fuel, MD 45/85/92 [id number]

## 2022-03-19 NOTE — Op Note (Signed)
NAMEANALIA, ZUK MEDICAL RECORD NO: [id number] ACCOUNT NO: [id number] DATE OF BIRTH: June 23, 1930 FACILITY: [facility] LOCATION: [facility] PHYSICIAN: Revonda Standard. Roxan Hockey, MD  Operative Report   DATE OF PROCEDURE: 03/19/2022  PREOPERATIVE DIAGNOSIS:  Critical left main disease, status post non-ST elevation myocardial infarction.  POSTOPERATIVE DIAGNOSIS:  Critical left main disease, status post non-ST elevation myocardial infarction.  PROCEDURE:   Emergency median sternotomy, extracorporeal circulation,  Coronary artery bypass grafting x 2  Left internal mammary artery to LAD,  Saphenous vein graft to obtuse marginal Endoscopic vein harvest right leg.  SURGEON:  Revonda Standard. Roxan Hockey, MD  ASSISTANT:  Enid Cutter, PA  ANESTHESIA:  General.  FINDINGS:  Transesophageal echocardiography showed preserved left ventricular function, no significant valvular pathology.  Post-bypass study unchanged.  Mammary and saphenous vein good quality for patient's age. LAD diffusely diseased, fair quality. OM good quality.  CLINICAL NOTE:  Mrs. Groesbeck is an 89 year old woman who presented with chest pain and ruled in for non-ST elevation MI.  She underwent cardiac catheterization, which revealed critical distal left main stenosis and 3-vessel coronary artery disease.  There was a small nondominant right that was also stenotic.  She had chest pain in the catheterization laboratory and a balloon pump was placed.  She was offered emergent coronary bypass grafting for survival benefit and relief of symptoms.  The pain did resolve after placement of the intraaortic balloon pump.  She understood and accepted the risks of surgery and agreed to proceed.  OPERATIVE NOTE:  Mrs. Reinhardt was brought to the operating room on 03/19/2022. Anesthesia service placed an arterial blood pressure monitoring line.  A Swan-Ganz catheter was placed.  She was anesthetized and intubated.  Foley catheter was placed.  Intravenous  antibiotics were administered.  Transesophageal echocardiography was performed, findings as noted above.  The chest, abdomen and legs were prepped and draped in the usual sterile fashion.  A timeout was performed.  An incision was made in the medial aspect of the right leg just below the knee, the greater saphenous vein was identified, and was harvested endoscopically from the right thigh.  Saphenous vein was a satisfactory vessel, good quality, given the patient's age.  Simultaneously, a median sternotomy was performed and the left internal mammary artery was harvested under direct vision. Mammary artery was also good quality and had excellent flow when divided distally. 2000 units of heparin was administered during the vessel harvest.  The remainder of the full heparin dose was given prior to opening the pericardium.  Pericardium was opened.  The ascending aorta was inspected.  There was no significant atherosclerotic disease in the ascending aorta.  After confirming adequate anticoagulation with ACT measurement, the aorta was cannulated via concentric 2-0 Ethibond pledgeted pursestring sutures.  A dual stage venous cannula was placed via a pursestring suture in right atrial appendage.  Cardiopulmonary bypass was initiated.  Flows were maintained per protocol.  The patient was cooled to 34 degrees Celsius.  The  coronary arteries were inspected and anastomotic sites were chosen.  Conduits were inspected and cut to length.  A foam pad was placed in the pericardium to insulate the heart.  A temperature probe was placed in the myocardial septum and a cardioplegia cannula was placed in the ascending aorta.  The aorta was crossclamped.  The left ventricle was emptied via the aortic root vent.  Cardiac arrest was achieved with a combination of cold antegrade blood cardioplegia and topical ice saline.  There was a rapid diastolic arrest and there  was septal cooling to 11 degrees Celsius.  A reversed saphenous  vein graft was placed end-to-side to the first obtuse marginal.  This was a dominant lateral branch which bifurcated just beyond the anastomosis.  It was a 1.5 mm good quality target.  The vein was of good quality.  It was anastomosed end-to-side with a running 7-0 Prolene suture.  At completion of the anastomosis, a probe passed easily proximally and distally.  Cardioplegia was administered and there was good flow and good hemostasis.  Next, the left internal mammary artery was brought through a window in the pericardium.  The distal end was bevelled.  It was anastomosed end-to-side to the distal LAD. The LAD was diffusely diseased.  The mammary was of good quality.  It was anastomosed end-to-side with a running 8-0 Prolene suture.  At the completion of the anastomosis, the bulldog clamp was briefly removed.  Rapid septal rewarming was noted.  The bulldog clamp was replaced.  The mammary pedicle was tacked to the epicardial surface of the heart with 6-0 Prolene sutures.  Additional cardioplegia was administered.  The vein graft was cut to length.  The proximal vein graft anastomosis was performed to a 4.5 mm punch aortotomy with running 6-0 Prolene suture.  At the completion of the proximal anastomosis, the patient was placed in Trendelenburg position.  Lidocaine was administered and the aortic root was de-aired.  The cross clamp was removed.  The aortic crossclamp time was 36 minutes.  The patient required a single defibrillation and then was in heart block. While rewarming was completed, all  proximal and distal anastomoses were inspected for hemostasis.  Epicardial pacing wires were placed on the right ventricle and right atrium and DDD pacing was initiated. When the patient had rewarmed to a core temperature of 36.5 degrees Celsius, she was weaned from cardiopulmonary bypass on the first attempt.  She was on the intra-aortic balloon pump at 1:1.  She did not require inotropic support.  Total bypass time  was 63 minutes.  The initial cardiac index was approximately 1.5 liters per minute per meter squared, but did improve with time and volume resuscitation.  Post-bypass transesophageal echocardiography initially showed some anterior and septal hypokinesis, but that improved back to baseline.  A test dose of protamine was administered and was well tolerated.  The atrial and aortic cannulae were removed.  The remainder of the protamine was administered without incident.  The chest was irrigated with warm saline.  Hemostasis was achieved.  Left pleural and mediastinal chest tubes were placed through separate subcostal incisions.  The sternum was closed with a combination of single and double heavy gauge stainless steel wires.  Pectoralis fascia, subcutaneous tissue and skin were closed in standard fashion.  All sponge, needle and instrument counts were correct at the end of the procedure.  The patient was taken from the operating room to the surgical intensive care unit, intubated and in fair condition.  Experienced assistance was necessary for this case.  Enid Cutter served as Licensed conveyancer, harvesting the saphenous vein independently as the mammary artery was being harvested, closing the leg incisions and then providing exposure, retraction of delicate tissues, suture management and suctioning during the anastomoses.        PAA D: 03/19/2022 10:28:06 pm T: 03/19/2022 11:50:00 pm  JOB: [id number]/ [id number]

## 2022-03-19 NOTE — Interval H&P Note (Signed)
Cath Lab Visit (complete for each Cath Lab visit)  Clinical Evaluation Leading to the Procedure:   ACS: Yes.    Non-ACS:    Anginal Classification: CCS II  Anti-ischemic medical therapy: No Therapy  Non-Invasive Test Results: No non-invasive testing performed  Prior CABG: No previous CABG      History and Physical Interval Note:  03/19/2022 3:28 PM  Alice [name]  has presented today for surgery, with the diagnosis of nstemi.  The various methods of treatment have been discussed with the patient and family. After consideration of risks, benefits and other options for treatment, the patient has consented to  Procedure(s): LEFT HEART CATH AND CORONARY ANGIOGRAPHY (N/A) as a surgical intervention.  The patient's history has been reviewed, patient examined, no change in status, stable for surgery.  I have reviewed the patient's chart and labs.  Questions were answered to the patient's satisfaction.     Quay Burow

## 2022-03-19 NOTE — Anesthesia Procedure Notes (Signed)
Arterial Line Insertion Start/End1/22/2024 5:23 PM Performed by: Janene Harvey, CRNA, CRNA  Patient location: OR. Preanesthetic checklist: patient identified, risks and benefits discussed, monitors and equipment checked and pre-op evaluation Lidocaine 1% used for infiltration Left, radial was placed Catheter size: 20 G Hand hygiene performed   Attempts: 1 Procedure performed without using ultrasound guided technique. Following insertion, dressing applied and Biopatch. Post procedure assessment: unchanged  Patient tolerated the procedure well with no immediate complications.

## 2022-03-19 NOTE — ED Triage Notes (Signed)
Pt bib ems from home; intermittent cp tightness/ pressure x 2-3 days, radiating to L arm; 10/10 on ems arrival; denies N/V; endorses some sob; 170/100 initially; 2 sl nitro given pta, 138/80, pain now 2/10, 324 asa given, 20 ga lac, 12 lead depression noted by ems in v5, v6 initially; 10 minutes after nitro back to baseline; PVCs, episodes of bigeminy lasting approx 30 seconds; 92-93 % RA, pt placed on 3L, 98%; hx hypothyroidism, taking synthroid intermittently; denies cardiac hx

## 2022-03-19 NOTE — Hospital Course (Addendum)
History of Present Illness: Alice [name] is an 89 y.o. female.  HPI: 89 yo woman with PMH of breast cancer, hyperlipidemia, hypothyroidism, diverticulitis, and degenerative joint disease who presented to the ED earlier this morning with unstable CP. She ruled in for nonSTEMI.  In cath lab today she was found to have critical left main disease and had chest pain requiring IABP placement.  Currently pain resolved.   CABG indicated for survival benefit and relief of symptoms.    Hospital Course: Alice [name] was taken directly from the Cath Lab to the operating room where an emergency CABG x 2 was performed.  The left internal mammary artery was grafted to the left anterior descending coronary artery and endoscopically harvested saphenous vein was grafted to the obtuse marginal coronary artery.  Following the procedure, she separated from cardiopulmonary bypass without difficulty and required no inotropic or pressor support. The IABP remained in place immediately post-op and she was transferred to the surgical ICU in stable condition.  She was initially AAI paced. IABP was removed on post op day one. Alice [name], a line, chest tubes, and foley were all removed early in her post operative course.  She had mild thrombocytopenia post op. Her platelet count went as low as 97,000. Thrombocytopenia did improve as last platelet count was up to 202,000. Blood pressure was labile initially post op but improved with Midodrine 10 mg tid.She had expected post op blood loss anemia. She did not require a post op transfusion. She developed swelling and erythema antecubital left forearm;phlebitis from IV. She was given warm compresses and Keflex. She was transitioned off the Insulin drip. Her pre op HGA1C is 5.7. She likely has pre diabetes. Once she is transferred from the ICU to the floor, accu checks and sliding scale will be stopped. She had confusion post op and this did resolve later in her post op course. She will be  provided with nutritional information with her discharge paperwork. She was felt surgically stable for transfer from the ICU to 4E for further convalescence on 01/25.  Midodrine was stopped and she was started on low dose Lopressor.  PA/LAT CXR was done 01/29 and showed persistent left pleural effusion and atelectasis. IR was asked to do a left thoracentesis;700 cc was removed. Follow up CXR showed no pneumothorax, persistent small pleural effusions.She required a few liters of oxygen via [place]. Because of continued desaturation, she will need 2 liters of oxygen at discharge. Lisinopril was started for hypertension on 01/29. She had brief atrial tachycardia (vs a flutter 2:1 block on Sunday 01/28. She had another brief episode of atrial tachycardia and ventricular trigeminy on 01/29. Cardiology titrated Lopressor to 50 mg bid on 01/30 and did not feel Amiodarone was needed at this time. Lisinopril was titrated to 20 mg daily for better BP control. She appeared euvolemic so she will not be discharged on Lasix/potassium. PT/OT initially recommended home PT/OT but later changed to SNF.Sternal and RLE wounds are clean, dry, and healing without signs of infection. She lives alone and will need SNF at discharge.

## 2022-03-19 NOTE — Anesthesia Preprocedure Evaluation (Addendum)
Anesthesia Evaluation  Patient identified by MRN, date of birth, ID band Patient awake    Reviewed: Allergy & Precautions, H&P , NPO status , Patient's Chart, lab work & pertinent test results  Airway Mallampati: I  TM Distance: >3 FB Neck ROM: Full    Dental no notable dental hx. (+) Teeth Intact, Dental Advisory Given   Pulmonary former smoker   Pulmonary exam normal breath sounds clear to auscultation       Cardiovascular + angina  + CAD and + Past MI   Rhythm:Regular Rate:Normal     Neuro/Psych   Anxiety Depression    negative neurological ROS     GI/Hepatic negative GI ROS, Neg liver ROS,,,  Endo/Other  Hypothyroidism    Renal/GU negative Renal ROS  negative genitourinary   Musculoskeletal  (+) Arthritis , Osteoarthritis,    Abdominal   Peds  Hematology  (+) Blood dyscrasia, anemia   Anesthesia Other Findings   Reproductive/Obstetrics negative OB ROS                             Anesthesia Physical Anesthesia Plan  ASA: 4 and emergent  Anesthesia Plan: General   Post-op Pain Management:    Induction: Intravenous  PONV Risk Score and Plan: 3 and Treatment may vary due to age or medical condition  Airway Management Planned: Oral ETT  Additional Equipment: Arterial line, CVP, PA Cath, TEE and Ultrasound Guidance Line Placement  Intra-op Plan:   Post-operative Plan: Post-operative intubation/ventilation  Informed Consent: I have reviewed the patients History and Physical, chart, labs and discussed the procedure including the risks, benefits and alternatives for the proposed anesthesia with the patient or authorized representative who has indicated his/her understanding and acceptance.     Dental advisory given  Plan Discussed with: CRNA  Anesthesia Plan Comments:        Anesthesia Quick Evaluation

## 2022-03-20 ENCOUNTER — Encounter (HOSPITAL_COMMUNITY): Payer: Self-pay | Admitting: Cardiovascular Disease

## 2022-03-20 ENCOUNTER — Inpatient Hospital Stay (HOSPITAL_COMMUNITY): Payer: Medicare PPO

## 2022-03-20 DIAGNOSIS — Z951 Presence of aortocoronary bypass graft: Secondary | ICD-10-CM

## 2022-03-20 DIAGNOSIS — I214 Non-ST elevation (NSTEMI) myocardial infarction: Secondary | ICD-10-CM | POA: Diagnosis not present

## 2022-03-20 LAB — POCT I-STAT 7, (LYTES, BLD GAS, ICA,H+H)
Acid-base deficit: 3 mmol/L — ABNORMAL HIGH (ref 0.0–2.0)
Acid-base deficit: 4 mmol/L — ABNORMAL HIGH (ref 0.0–2.0)
Acid-base deficit: 3 mmol/L — ABNORMAL HIGH (ref 0.0–2.0)
Acid-base deficit: 3 mmol/L — ABNORMAL HIGH (ref 0.0–2.0)
Bicarbonate: 24.1 mmol/L (ref 20.0–28.0)
Bicarbonate: 23.6 mmol/L (ref 20.0–28.0)
Bicarbonate: 24.2 mmol/L (ref 20.0–28.0)
Bicarbonate: 24.2 mmol/L (ref 20.0–28.0)
Calcium, Ion: 1.1 mmol/L — ABNORMAL LOW (ref 1.15–1.40)
Calcium, Ion: 1.32 mmol/L (ref 1.15–1.40)
Calcium, Ion: 1.24 mmol/L (ref 1.15–1.40)
Calcium, Ion: 1.26 mmol/L (ref 1.15–1.40)
HCT: 32 % — ABNORMAL LOW (ref 36.0–46.0)
HCT: 29 % — ABNORMAL LOW (ref 36.0–46.0)
HCT: 31 % — ABNORMAL LOW (ref 36.0–46.0)
HCT: 31 % — ABNORMAL LOW (ref 36.0–46.0)
Hemoglobin: 10.9 g/dL — ABNORMAL LOW (ref 12.0–15.0)
Hemoglobin: 9.9 g/dL — ABNORMAL LOW (ref 12.0–15.0)
Hemoglobin: 10.5 g/dL — ABNORMAL LOW (ref 12.0–15.0)
Hemoglobin: 10.5 g/dL — ABNORMAL LOW (ref 12.0–15.0)
O2 Saturation: 100 %
O2 Saturation: 96 %
O2 Saturation: 98 %
O2 Saturation: 97 %
Patient temperature: 35.3
Patient temperature: 36.1
Patient temperature: 98.4
Patient temperature: 97.4
Potassium: 4.1 mmol/L (ref 3.5–5.1)
Potassium: 4.1 mmol/L (ref 3.5–5.1)
Potassium: 4.4 mmol/L (ref 3.5–5.1)
Potassium: 4.2 mmol/L (ref 3.5–5.1)
Sodium: 142 mmol/L (ref 135–145)
Sodium: 142 mmol/L (ref 135–145)
Sodium: 141 mmol/L (ref 135–145)
Sodium: 142 mmol/L (ref 135–145)
TCO2: 26 mmol/L (ref 22–32)
TCO2: 25 mmol/L (ref 22–32)
TCO2: 26 mmol/L (ref 22–32)
TCO2: 26 mmol/L (ref 22–32)
pCO2 arterial: 46.2 mmHg (ref 32–48)
pCO2 arterial: 52.6 mmHg — ABNORMAL HIGH (ref 32–48)
pCO2 arterial: 50.3 mmHg — ABNORMAL HIGH (ref 32–48)
pCO2 arterial: 50.5 mmHg — ABNORMAL HIGH (ref 32–48)
pH, Arterial: 7.317 — ABNORMAL LOW (ref 7.35–7.45)
pH, Arterial: 7.256 — ABNORMAL LOW (ref 7.35–7.45)
pH, Arterial: 7.291 — ABNORMAL LOW (ref 7.35–7.45)
pH, Arterial: 7.285 — ABNORMAL LOW (ref 7.35–7.45)
pO2, Arterial: 184 mmHg — ABNORMAL HIGH (ref 83–108)
pO2, Arterial: 94 mmHg (ref 83–108)
pO2, Arterial: 116 mmHg — ABNORMAL HIGH (ref 83–108)
pO2, Arterial: 106 mmHg (ref 83–108)

## 2022-03-20 LAB — GLUCOSE, CAPILLARY
Glucose-Capillary: 106 mg/dL — ABNORMAL HIGH (ref 70–99)
Glucose-Capillary: 110 mg/dL — ABNORMAL HIGH (ref 70–99)
Glucose-Capillary: 115 mg/dL — ABNORMAL HIGH (ref 70–99)
Glucose-Capillary: 117 mg/dL — ABNORMAL HIGH (ref 70–99)
Glucose-Capillary: 127 mg/dL — ABNORMAL HIGH (ref 70–99)
Glucose-Capillary: 128 mg/dL — ABNORMAL HIGH (ref 70–99)
Glucose-Capillary: 130 mg/dL — ABNORMAL HIGH (ref 70–99)
Glucose-Capillary: 133 mg/dL — ABNORMAL HIGH (ref 70–99)
Glucose-Capillary: 137 mg/dL — ABNORMAL HIGH (ref 70–99)
Glucose-Capillary: 139 mg/dL — ABNORMAL HIGH (ref 70–99)
Glucose-Capillary: 139 mg/dL — ABNORMAL HIGH (ref 70–99)
Glucose-Capillary: 163 mg/dL — ABNORMAL HIGH (ref 70–99)
Glucose-Capillary: 169 mg/dL — ABNORMAL HIGH (ref 70–99)

## 2022-03-20 LAB — CBC
HCT: 30.7 % — ABNORMAL LOW (ref 36.0–46.0)
HCT: 31.6 % — ABNORMAL LOW (ref 36.0–46.0)
Hemoglobin: 10.1 g/dL — ABNORMAL LOW (ref 12.0–15.0)
Hemoglobin: 10.7 g/dL — ABNORMAL LOW (ref 12.0–15.0)
MCH: 28.7 pg (ref 26.0–34.0)
MCH: 29.2 pg (ref 26.0–34.0)
MCHC: 32.9 g/dL (ref 30.0–36.0)
MCHC: 33.9 g/dL (ref 30.0–36.0)
MCV: 87.2 fL (ref 80.0–100.0)
MCV: 86.1 fL (ref 80.0–100.0)
Platelets: 97 10*3/uL — ABNORMAL LOW (ref 150–400)
Platelets: 120 10*3/uL — ABNORMAL LOW (ref 150–400)
RBC: 3.52 MIL/uL — ABNORMAL LOW (ref 3.87–5.11)
RBC: 3.67 MIL/uL — ABNORMAL LOW (ref 3.87–5.11)
RDW: 16 % — ABNORMAL HIGH (ref 11.5–15.5)
RDW: 16.4 % — ABNORMAL HIGH (ref 11.5–15.5)
WBC: 9.6 10*3/uL (ref 4.0–10.5)
WBC: 13.4 10*3/uL — ABNORMAL HIGH (ref 4.0–10.5)
nRBC: 0 % (ref 0.0–0.2)
nRBC: 0 % (ref 0.0–0.2)

## 2022-03-20 LAB — BASIC METABOLIC PANEL
Anion gap: 6 (ref 5–15)
Anion gap: 7 (ref 5–15)
BUN: 15 mg/dL (ref 8–23)
BUN: 14 mg/dL (ref 8–23)
CO2: 25 mmol/L (ref 22–32)
CO2: 23 mmol/L (ref 22–32)
Calcium: 8 mg/dL — ABNORMAL LOW (ref 8.9–10.3)
Calcium: 8.4 mg/dL — ABNORMAL LOW (ref 8.9–10.3)
Chloride: 110 mmol/L (ref 98–111)
Chloride: 110 mmol/L (ref 98–111)
Creatinine, Ser: 0.78 mg/dL (ref 0.44–1.00)
Creatinine, Ser: 0.81 mg/dL (ref 0.44–1.00)
GFR, Estimated: >60 mL/min (ref >60)
GFR, Estimated: >60 mL/min (ref >60)
Glucose, Bld: 97 mg/dL (ref 70–99)
Glucose, Bld: 126 mg/dL — ABNORMAL HIGH (ref 70–99)
Potassium: 3.8 mmol/L (ref 3.5–5.1)
Potassium: 3.9 mmol/L (ref 3.5–5.1)
Sodium: 141 mmol/L (ref 135–145)
Sodium: 140 mmol/L (ref 135–145)

## 2022-03-20 LAB — COOXEMETRY PANEL
Carboxyhemoglobin: 3 % — ABNORMAL HIGH (ref 0.5–1.5)
Methemoglobin: <0.7 % (ref 0.0–1.5)
O2 Saturation: 65.1 %
Total hemoglobin: 9.9 g/dL — ABNORMAL LOW (ref 12.0–16.0)

## 2022-03-20 LAB — MAGNESIUM
Magnesium: 3.1 mg/dL — ABNORMAL HIGH (ref 1.7–2.4)
Magnesium: 2.3 mg/dL (ref 1.7–2.4)

## 2022-03-20 LAB — POCT ACTIVATED CLOTTING TIME: Activated Clotting Time: 147 seconds

## 2022-03-20 MED ORDER — INSULIN DETEMIR 100 UNIT/ML ~~LOC~~ SOLN
10.0000 [IU] | Freq: Every day | SUBCUTANEOUS | Status: DC
  Administered 2022-03-20: 10 [IU] via SUBCUTANEOUS
  Filled 2022-03-20 (×2): qty 0.1

## 2022-03-20 MED ORDER — CALCIUM CHLORIDE 10 % IV SOLN
1.0000 g | Freq: Once | INTRAVENOUS | Status: AC
  Administered 2022-03-20: 1 g via INTRAVENOUS

## 2022-03-20 MED ORDER — ALBUMIN HUMAN 5 % IV SOLN
12.5000 g | Freq: Once | INTRAVENOUS | Status: AC
  Administered 2022-03-20: 12.5 g via INTRAVENOUS

## 2022-03-20 MED ORDER — INSULIN ASPART 100 UNIT/ML IJ SOLN
0.0000 [IU] | INTRAMUSCULAR | Status: DC
  Administered 2022-03-20: 2 [IU] via SUBCUTANEOUS
  Administered 2022-03-20: 4 [IU] via SUBCUTANEOUS
  Administered 2022-03-21: 2 [IU] via SUBCUTANEOUS

## 2022-03-20 MED ORDER — SODIUM BICARBONATE 8.4 % IV SOLN
50.0000 meq | Freq: Once | INTRAVENOUS | Status: AC
  Administered 2022-03-20: 50 meq via INTRAVENOUS

## 2022-03-20 MED ORDER — POTASSIUM CHLORIDE 10 MEQ/50ML IV SOLN
10.0000 meq | INTRAVENOUS | Status: AC
  Administered 2022-03-20 (×3): 10 meq via INTRAVENOUS

## 2022-03-20 MED ORDER — ORAL CARE MOUTH RINSE: 15.0000 mL | OROMUCOSAL | Status: DC | PRN

## 2022-03-20 MED ORDER — ENOXAPARIN SODIUM 40 MG/0.4ML IJ SOSY
40.0000 mg | PREFILLED_SYRINGE | Freq: Every day | INTRAMUSCULAR | Status: DC
  Administered 2022-03-20 – 2022-03-22 (×3): 40 mg via SUBCUTANEOUS
  Filled 2022-03-20 (×3): qty 0.4

## 2022-03-20 MED ORDER — DOCUSATE SODIUM 50 MG/5ML PO LIQD
200.0000 mg | Freq: Every day | ORAL | Status: DC
  Administered 2022-03-20: 200 mg
  Filled 2022-03-20 (×2): qty 20

## 2022-03-20 MED ORDER — ROSUVASTATIN CALCIUM 20 MG PO TABS
20.0000 mg | ORAL_TABLET | Freq: Every day | ORAL | Status: DC
  Administered 2022-03-20: 20 mg
  Filled 2022-03-20: qty 1

## 2022-03-20 MED ORDER — CHLORHEXIDINE GLUCONATE CLOTH 2 % EX PADS
6.0000 | MEDICATED_PAD | Freq: Every day | CUTANEOUS | Status: DC
  Administered 2022-03-20 – 2022-03-24 (×5): 6 via TOPICAL

## 2022-03-20 MED ORDER — LEVOTHYROXINE SODIUM 112 MCG PO TABS
112.0000 ug | ORAL_TABLET | Freq: Every morning | ORAL | Status: DC
  Administered 2022-03-21 – 2022-04-03 (×14): 112 ug
  Filled 2022-03-20 (×14): qty 1

## 2022-03-20 MED FILL — Heparin Sod (Porcine)-NaCl IV Soln 1000 Unit/500ML-0.9%: INTRAVENOUS | Qty: 500 | Status: AC

## 2022-03-20 MED FILL — Nitroglycerin IV Soln 100 MCG/ML in D5W: INTRA_ARTERIAL | Qty: 10 | Status: AC

## 2022-03-20 MED FILL — Verapamil HCl IV Soln 2.5 MG/ML: INTRAVENOUS | Qty: 2 | Status: AC

## 2022-03-20 NOTE — Progress Notes (Signed)
Orthopedic Tech Progress Note Patient Details:  [name] Oct 07, 1930 [id number]  Patient ID: [name], female   DOB: 05-03-1930, 89 y.o.   MRN: [id number] I gave the brace to the rn as they were cleaning the patient and said they would apply it after. Karolee Stamps 03/20/2022, 12:24 AM

## 2022-03-20 NOTE — Discharge Instructions (Addendum)

## 2022-03-20 NOTE — Progress Notes (Signed)
Rounding Note    Patient Name: Alice [name] Date of Encounter: 03/20/2022  [place] Cardiologist: Freada Bergeron, MD   Subjective   Opens eyes to voice this morning and responding appropriately to questions.   LHC yesterday with 99% LM disease, 90% prox RCA, and mid LAD 60%. IABP placed and patient taken directly to OR where she underwent LIMA to LAD, SVG to OM.   Inpatient Medications    Scheduled Meds:  acetaminophen  1,000 mg Oral Q6H   Or   acetaminophen (TYLENOL) oral liquid 160 mg/5 mL  1,000 mg Per Tube Q6H   bisacodyl  10 mg Oral Daily   Or   bisacodyl  10 mg Rectal Daily   chlorhexidine  15 mL Mouth/Throat NOW   Chlorhexidine Gluconate Cloth  6 each Topical Daily   docusate  200 mg Per Tube Daily   enoxaparin (LOVENOX) injection  40 mg Subcutaneous QHS   insulin aspart  0-24 Units Subcutaneous Q4H   insulin detemir  10 Units Subcutaneous Daily   [START ON 03/21/2022] levothyroxine  112 mcg Per Tube q AM   metoprolol tartrate  12.5 mg Oral BID   Or   metoprolol tartrate  12.5 mg Per Tube BID   omega-3 acid ethyl esters  1 g Oral Daily   mouth rinse  15 mL Mouth Rinse Q2H   [START ON 03/21/2022] pantoprazole  40 mg Oral Daily   sodium chloride flush  3 mL Intravenous Q12H   Continuous Infusions:  sodium chloride     sodium chloride     sodium chloride 0 mL/hr at 03/19/22 2343   sodium chloride Stopped (03/20/22 0102)   albumin human 60 mL/hr at 03/20/22 0700    ceFAZolin (ANCEF) IV Stopped (03/20/22 0545)   dexmedetomidine (PRECEDEX) IV infusion 0.1 mcg/kg/hr (03/20/22 0700)   famotidine (PEPCID) IV Stopped (03/19/22 2324)   insulin 0.9 Units/hr (03/20/22 0700)   lactated ringers     lactated ringers 20 mL/hr at 03/20/22 0700   lactated ringers 20 mL/hr at 03/20/22 0700   nitroGLYCERIN Stopped (03/20/22 0648)   phenylephrine (NEO-SYNEPHRINE) Adult infusion 5 mcg/min (03/20/22 0700)   potassium chloride 10 mEq (03/20/22 0704)   PRN  Meds: sodium chloride, acetaminophen, albumin human, dextrose, lactated ringers, metoprolol tartrate, midazolam, morphine injection, ondansetron (ZOFRAN) IV, mouth rinse, oxyCODONE, sodium chloride flush   Vital Signs    Vitals:   03/20/22 0615 03/20/22 0630 03/20/22 0645 03/20/22 0700  BP: 108/63 113/71 116/64   Pulse: 89 (!) 40 (!) 39 83  Resp: 16 12 (!) 0 11  Temp: (!) 96.4 F (35.8 C) (!) 96.6 F (35.9 C) (!) 97 F (36.1 C) (!) 97 F (36.1 C)  TempSrc:      SpO2: 95% 95% 95% 96%  Weight:      Height:        Intake/Output Summary (Last 24 hours) at 03/20/2022 0809 Last data filed at 03/20/2022 0700 Gross per 24 hour  Intake 6505.17 ml  Output 2190 ml  Net 4315.17 ml      03/20/2022    5:00 AM 03/19/2022    6:16 AM 02/01/2020   10:00 AM  Last 3 Weights  Weight (lbs) 150 lb 5.7 oz 120 lb 128 lb 12.8 oz  Weight (kg) 68.2 kg 54.432 kg 58.423 kg      Telemetry    A-V pacing - Personally Reviewed  ECG    No new tracing - Personally Reviewed  Physical Exam  GEN: Intubated but opens eyes to voice and nods "yes" and "no" to questions  Neck: No JVD Cardiac: RRR, 2/6 systolic murmur Respiratory: Coarse vent sounds. Chest tubes in place GI: Soft, nontender, non-distended  MS: No edema; No deformity. Right IABP in place with site c/d/i Neuro:  Opens eyes to voice and nods appropriately to questions Psych: Normal affect   Labs    High Sensitivity Troponin:   Recent Labs  Lab 03/19/22 0628 03/19/22 0815  TROPONINIHS 116* 126*     Chemistry Recent Labs  Lab 03/19/22 0628 03/19/22 1800 03/19/22 2015 03/19/22 2057 03/19/22 2102 03/19/22 2229 03/20/22 0335 03/20/22 0652  NA 138   < > 138 139   < > 142 141 142  K 3.8   < > 5.6* 5.5*   < > 4.1 3.8 4.1  CL 100   < > 101 104  --   --  110  --   CO2 27  --   --   --   --   --  25  --   GLUCOSE 123*   < > 114* 105*  --   --  97  --   BUN 29*   < > 19 21  --   --  15  --   CREATININE 0.88   < > 0.50 0.40*  --    --  0.78  --   CALCIUM 9.2  --   --   --   --   --  8.0*  --   MG  --   --   --   --   --   --  3.1*  --   GFRNONAA >60  --   --   --   --   --  >60  --   ANIONGAP 11  --   --   --   --   --  6  --    < > = values in this interval not displayed.    Lipids  Recent Labs  Lab 03/19/22 0815  CHOL 238*  TRIG 42  HDL 80  LDLCALC 150*  CHOLHDL 3.0    Hematology Recent Labs  Lab 03/19/22 0628 03/19/22 1800 03/19/22 2011 03/19/22 2015 03/19/22 2201 03/19/22 2229 03/20/22 0335 03/20/22 0652  WBC 9.1  --   --   --  10.9*  --  9.6  --   RBC 4.50  --   --   --  3.70*  --  3.52*  --   HGB 12.5   < > 7.0*   < > 10.9* 10.9* 10.1* 9.9*  HCT 40.0   < > 21.3*   < > 31.7* 32.0* 30.7* 29.0*  MCV 88.9  --   --   --  85.7  --  87.2  --   MCH 27.8  --   --   --  29.5  --  28.7  --   MCHC 31.3  --   --   --  34.4  --  32.9  --   RDW 15.3  --   --   --  15.4  --  16.0*  --   PLT 252  --  PLATELET CLUMPS NOTED ON SMEAR, UNABLE TO ESTIMATE  --  97*  --  97*  --    < > = values in this interval not displayed.   Thyroid No results for input(s): "TSH", "FREET4" in the last 168 hours.  BNPNo results for input(s): "BNP", "  PROBNP" in the last 168 hours.  DDimer No results for input(s): "DDIMER" in the last 168 hours.   Radiology    DG CHEST PORT 1 VIEW  Addendum Date: 03/20/2022   ADDENDUM REPORT: 03/20/2022 01:18 ADDENDUM: The side port of the enteric tube is in the proximal stomach just distal to the GE junction. The tip of the enteric tube is in the left upper abdomen likely in the region of the gastric fundus or proximal body. Electronically Signed   By: Anner Crete M.D.   On: 03/20/2022 01:18   Result Date: 03/20/2022 CLINICAL DATA:  Balloon pump adjustment. EXAM: PORTABLE CHEST 1 VIEW COMPARISON:  Earlier chest radiograph dated 03/19/2022. FINDINGS: Interval advancement of intra-aortic balloon pump with radiopaque tip projecting over the left hilum. Additional support apparatus in stable  position. No interval change in the appearance of the lungs since the earlier radiograph. IMPRESSION: Interval advancement of intra-aortic balloon pump with radiopaque tip projecting over the left hilum. Electronically Signed: By: Anner Crete M.D. On: 03/20/2022 00:52   DG Abd 1 View  Addendum Date: 03/20/2022   ADDENDUM REPORT: 03/20/2022 01:18 ADDENDUM: The side port of the enteric tube is in the proximal stomach just distal to the GE junction. The tip of the enteric tube is in the left upper abdomen likely in the region of the gastric fundus or proximal body. Electronically Signed   By: Anner Crete M.D.   On: 03/20/2022 01:18   Result Date: 03/20/2022 CLINICAL DATA:  Balloon pump adjustment. EXAM: PORTABLE CHEST 1 VIEW COMPARISON:  Earlier chest radiograph dated 03/19/2022. FINDINGS: Interval advancement of intra-aortic balloon pump with radiopaque tip projecting over the left hilum. Additional support apparatus in stable position. No interval change in the appearance of the lungs since the earlier radiograph. IMPRESSION: Interval advancement of intra-aortic balloon pump with radiopaque tip projecting over the left hilum. Electronically Signed: By: Anner Crete M.D. On: 03/20/2022 00:52   DG Chest Port 1 View  Result Date: 03/19/2022 CLINICAL DATA:  Status post CABG. EXAM: PORTABLE CHEST 1 VIEW COMPARISON:  March 19, 2022 FINDINGS: An endotracheal tube is seen with its distal tip approximately 2.8 cm from the carina. An orogastric tube is noted, with its distal end extending below the level of the diaphragm. A right internal jugular Swan-Ganz catheter is noted with its distal tip overlying the medial aspect of the right hilum. Bilateral chest tubes are in place. Multiple sternal wires and vascular clips are seen. The heart size and mediastinal contours are within normal limits. There is marked severity calcification of the aortic arch. There is no evidence of an acute infiltrate, pleural  effusion or pneumothorax. The visualized skeletal structures are unremarkable. IMPRESSION: 1. Multiple support line placement and positioning, as described above, with additional findings consistent with interval median sternotomy/CABG since the prior study. Electronically Signed   By: Virgina Norfolk M.D.   On: 03/19/2022 23:01   EP STUDY  Result Date: 03/19/2022 See surgical note for result.  ECHO INTRAOPERATIVE TEE  Result Date: 03/19/2022  *INTRAOPERATIVE TRANSESOPHAGEAL REPORT *  Patient Name:   Alice [name] [facility] Date of Exam: 03/19/2022 Medical Rec #:  [id number]       Height:       62.0 in Accession #:    [id number]      Weight:       120.0 lb Date of Birth:  08/21/1930        BSA:  1.54 m Patient Age:    34 years        BP:           125/78 mmHg Patient Gender: F               HR:           78 bpm. Exam Location:  Anesthesiology Transesophogeal exam was perform intraoperatively during surgical procedure. Patient was closely monitored under general anesthesia during the entirety of examination. Indications:     CABG Performing Phys: Adele Barthel MD Diagnosing Phys: Adele Barthel MD Complications: No known complications during this procedure. POST-OP IMPRESSIONS _ Left Ventricle: The left ventricle is unchanged from pre-bypass. _ Right Ventricle: The right ventricle appears unchanged from pre-bypass. _ Aorta: The aorta appears unchanged from pre-bypass. _ Left Atrial Appendage: The left atrial appendage appears unchanged from pre-bypass. _ Aortic Valve: The aortic valve appears unchanged from pre-bypass. _ Mitral Valve: The mitral valve appears unchanged from pre-bypass. _ Tricuspid Valve: The tricuspid valve appears unchanged from pre-bypass. _ Pulmonic Valve: The pulmonic valve appears unchanged from pre-bypass. _ Interatrial Septum: The interatrial septum appears unchanged from pre-bypass. PRE-OP FINDINGS  Left Ventricle: The left ventricle has normal systolic function, with an estimated  ejection fraction of 55-60%. The cavity size was normal. Right Ventricle: The right ventricle has normal systolic function. The cavity was normal. There is no increase in right ventricular wall thickness. Left Atrium: Left atrial size was normal in size. No left atrial/left atrial appendage thrombus was detected. Right Atrium: Right atrial size was normal in size. Interatrial Septum: No atrial level shunt detected by color flow Doppler. Pericardium: There is no evidence of pericardial effusion. Mitral Valve: The mitral valve is normal in structure. Mitral valve regurgitation is mild by color flow Doppler. Tricuspid Valve: The tricuspid valve was normal in structure. Tricuspid valve regurgitation is mild by color flow Doppler. Aortic Valve: The aortic valve is normal in structure. Aortic valve regurgitation was not visualized by color flow Doppler. There is mild thickening and mild calcification present on the aortic valve right coronary, left coronary and non-coronary cusps. Mobility of the left and right coronary cusps is mildly decreased. Pulmonic Valve: The pulmonic valve was normal in structure. Pulmonic valve regurgitation is trivial by color flow Doppler. Aorta: The aortic root, ascending aorta and aortic arch are normal in size and structure. The descending aorta was not well visualized due to the placement of balloon pump. The IABP terminates in the descending aorta.  Adele Barthel MD Electronically signed by Adele Barthel MD Signature Date/Time: 03/19/2022/9:50:20 PM    Final    CARDIAC CATHETERIZATION  Result Date: 03/19/2022 Images from the original result were not included.   Mid LM to Dist LM lesion is 99% stenosed.   Prox RCA lesion is 90% stenosed.   Mid LAD lesion is 60% stenosed. Alice [name] is a 89 y.o. female  [id number] LOCATION:  FACILITY: [facility] PHYSICIAN: Quay Burow, M.D. [birth year] DATE OF PROCEDURE:  03/19/2022 DATE OF DISCHARGE: CARDIAC CATHETERIZATION History obtained from chart  review.Alice [name] is a 89 y.o. female with a hx of HLD, prior tobacco abuse, hypothyroidism and history of breast cancer who is being seen 03/19/2022 for the evaluation of chest pain and elevated troponin at the request of Dr. Gustavus Messing. Her troponins were not mildly elevated and she had subtle lateral ST segment depression.  She was seen by Dr. Johney Frame who felt that she should undergo coronary angiography to define her anatomy.  Ms. 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  She is having active angina.  I placed an intra-aortic balloon pump.  I bolused her with 7000 units of heparin and placed her on a heparin drip.  Dr. Roxan Hockey has seen her and has agreed to take her for coronary artery bypass graft grafting urgently. Quay Burow. MD, [facility] 03/19/2022 4:30 PM    ECHOCARDIOGRAM COMPLETE  Result Date: 03/19/2022    ECHOCARDIOGRAM REPORT   Patient Name:   Alice [name] [facility] Date of Exam: 03/19/2022 Medical Rec #:  [id number]       Height:       62.0 in Accession #:    [id number]      Weight:       120.0 lb Date of Birth:  1930-11-08        BSA:          1.539 m Patient Age:    89 years        BP:           137/61 mmHg Patient Gender: F               HR:           74 bpm. Exam Location:  Inpatient Procedure: 2D Echo, Cardiac Doppler, Color Doppler and Intracardiac            Opacification Agent Indications:    Chest Pain  History:        Patient has no prior history of Echocardiogram examinations.                 Signs/Symptoms:Shortness of Breath and Chest Pain; Risk                 Factors:Dyslipidemia and Former Smoker. Hx of breast cancer.  Sonographer:    Eartha Inch Referring Phys: [id number] Nida Manfredi E Pearlean Sabina  Sonographer Comments: Image acquisition challenging due to patient body habitus and Image acquisition challenging due to respiratory motion. IMPRESSIONS  1. Left ventricular ejection fraction, by estimation, is 60 to 65%. The left ventricle has normal function. The left ventricle has no  regional wall motion abnormalities. Left ventricular diastolic parameters are consistent with Grade I diastolic dysfunction (impaired relaxation).  2. Right ventricular systolic function is normal. The right ventricular size is normal. There is mildly elevated pulmonary artery systolic pressure.  3. Left atrial size was mildly dilated.  4. Large calcium deposit on PMVL.Marland Kitchen The mitral valve is degenerative. Mild mitral valve regurgitation. No evidence of mitral stenosis. Moderate mitral annular calcification.  5. The aortic valve is normal in structure. Aortic valve regurgitation is not visualized. Aortic valve sclerosis is present, with no evidence of aortic valve stenosis. Aortic valve area, by VTI measures 1.44 cm. Aortic valve mean gradient measures 8.0 mmHg. Aortic valve Vmax measures 1.81 m/s.  6. The inferior vena cava is dilated in size with >50% respiratory variability, suggesting right atrial pressure of 8 mmHg. FINDINGS  Left Ventricle: Left ventricular ejection fraction, by estimation, is 60 to 65%. The left ventricle has normal function. The left ventricle has no regional wall motion abnormalities. The left ventricular internal cavity size was normal in size. There is  no left ventricular hypertrophy. Left ventricular diastolic parameters are consistent with Grade I diastolic dysfunction (impaired relaxation). Indeterminate filling pressures. Right Ventricle: The right ventricular size is normal. No increase in right ventricular wall thickness. Right ventricular systolic function is normal. There is mildly elevated pulmonary artery systolic pressure. The tricuspid regurgitant velocity is 2.71  m/s, and with an assumed right atrial pressure of 8 mmHg, the estimated right ventricular systolic pressure is 13.2 mmHg. Left Atrium: Left atrial size was mildly dilated. Right Atrium: Right atrial size was normal in size. Pericardium: There is no evidence of pericardial effusion. Mitral Valve: Large calcium  deposit on PMVL. The mitral valve is degenerative in appearance. There is mild thickening of the mitral valve leaflet(s). Moderate mitral annular calcification. Mild mitral valve regurgitation. No evidence of mitral valve stenosis. MV peak gradient, 7.6 mmHg. The mean mitral valve gradient is 4.0 mmHg. Tricuspid Valve: The tricuspid valve is normal in structure. Tricuspid valve regurgitation is mild . No evidence of tricuspid stenosis. Aortic Valve: The aortic valve is normal in structure. Aortic valve regurgitation is not visualized. Aortic valve sclerosis is present, with no evidence of aortic valve stenosis. Aortic valve mean gradient measures 8.0 mmHg. Aortic valve peak gradient measures 13.1 mmHg. Aortic valve area, by VTI measures 1.44 cm. Pulmonic Valve: The pulmonic valve was normal in structure. Pulmonic valve regurgitation is not visualized. No evidence of pulmonic stenosis. Aorta: The aortic root and ascending aorta are structurally normal, with no evidence of dilitation. Venous: The inferior vena cava is dilated in size with greater than 50% respiratory variability, suggesting right atrial pressure of 8 mmHg. IAS/Shunts: No atrial level shunt detected by color flow Doppler.  LEFT VENTRICLE PLAX 2D LVIDd:         4.40 cm     Diastology LVIDs:         3.50 cm     LV e' medial:    4.90 cm/s LV PW:         0.60 cm     LV E/e' medial:  21.6 LV IVS:        0.40 cm     LV e' lateral:   8.59 cm/s LVOT diam:     1.70 cm     LV E/e' lateral: 12.3 LV SV:         58 LV SV Index:   37 LVOT Area:     2.27 cm  LV Volumes (MOD) LV vol d, MOD A2C: 61.7 ml LV vol d, MOD A4C: 64.4 ml LV vol s, MOD A2C: 25.4 ml LV vol s, MOD A4C: 22.3 ml LV SV MOD A2C:     36.3 ml LV SV MOD A4C:     64.4 ml LV SV MOD BP:      42.6 ml RIGHT VENTRICLE             IVC RV S prime:     13.80 cm/s  IVC diam: 2.10 cm TAPSE (M-mode): 1.5 cm LEFT ATRIUM             Index        RIGHT ATRIUM           Index LA diam:        4.00 cm 2.60 cm/m   RA  Area:     10.20 cm LA Vol (A2C):   50.6 ml 32.89 ml/m  RA Volume:   19.20 ml  12.48 ml/m LA Vol (A4C):   49.8 ml 32.37 ml/m LA Biplane Vol: 50.7 ml 32.95 ml/m  AORTIC VALVE AV Area (Vmax):    1.27 cm AV Area (Vmean):   1.28 cm AV Area (VTI):     1.44 cm AV Vmax:           181.00 cm/s AV Vmean:  141.000 cm/s AV VTI:            0.401 m AV Peak Grad:      13.1 mmHg AV Mean Grad:      8.0 mmHg LVOT Vmax:         101.00 cm/s LVOT Vmean:        79.600 cm/s LVOT VTI:          0.254 m LVOT/AV VTI ratio: 0.63  AORTA Ao Root diam: 2.90 cm Ao Asc diam:  2.90 cm MITRAL VALVE                  TRICUSPID VALVE MV Area (PHT): 2.65 cm       TR Peak grad:   29.4 mmHg MV Area VTI:   1.43 cm       TR Mean grad:   18.0 mmHg MV Peak grad:  7.6 mmHg       TR Vmax:        271.00 cm/s MV Mean grad:  4.0 mmHg       TR Vmean:       203.0 cm/s MV Vmax:       1.38 m/s MV Vmean:      92.3 cm/s      SHUNTS MV Decel Time: 286 msec       Systemic VTI:  0.25 m MR Peak grad:    89.1 mmHg    Systemic Diam: 1.70 cm MR Mean grad:    64.0 mmHg MR Vmax:         472.00 cm/s MR Vmean:        383.0 cm/s MR PISA:         0.57 cm MR PISA Eff ROA: 5 mm MR PISA Radius:  0.30 cm MV E velocity: 106.00 cm/s MV A velocity: 128.00 cm/s MV E/A ratio:  0.83 Skeet Latch MD Electronically signed by Skeet Latch MD Signature Date/Time: 03/19/2022/3:47:36 PM    Final    DG Chest 2 View  Result Date: 03/19/2022 CLINICAL DATA:  Chest pain EXAM: CHEST - 2 VIEW COMPARISON:  11/29/2011 FINDINGS: In lobulation of the right diaphragm attributed to eventration. Normal heart size and aortic contours accounting for rotation. Small hiatal hernia. Generous lung volumes. There is no edema, consolidation, effusion, or pneumothorax. Extensive artifact from EKG leads. IMPRESSION: Stable exam.  No evidence of acute disease. Electronically Signed   By: Jorje Guild M.D.   On: 03/19/2022 06:49    Cardiac Studies   LHC 03/19/22:   Mid LM to Dist LM  lesion is 99% stenosed.   Prox RCA lesion is 90% stenosed.   Mid LAD lesion is 60% stenosed.   Intervention   TTE 03/19/22: IMPRESSIONS     1. Left ventricular ejection fraction, by estimation, is 60 to 65%. The  left ventricle has normal function. The left ventricle has no regional  wall motion abnormalities. Left ventricular diastolic parameters are  consistent with Grade I diastolic  dysfunction (impaired relaxation).   2. Right ventricular systolic function is normal. The right ventricular  size is normal. There is mildly elevated pulmonary artery systolic  pressure.   3. Left atrial size was mildly dilated.   4. Large calcium deposit on PMVL.Marland Kitchen The mitral valve is degenerative. Mild  mitral valve regurgitation. No evidence of mitral stenosis. Moderate  mitral annular calcification.   5. The aortic valve is normal in structure. Aortic valve regurgitation is  not visualized. Aortic valve sclerosis is present, with no  evidence of  aortic valve stenosis. Aortic valve area, by VTI measures 1.44 cm. Aortic  valve mean gradient measures 8.0  mmHg. Aortic valve Vmax measures 1.81 m/s.   6. The inferior vena cava is dilated in size with >50% respiratory  variability, suggesting right atrial pressure of 8 mmHg.   Patient Profile     89 y.o. female with hx of HLD, prior tobacco abuse, hypothyroidism and history of breast cancer who presented with a NSTEMI found to have critical LM disease on cath prompting urgent CABG with LIMA-LAD and SVG-OM on 03/19/22.  Assessment & Plan    #Critical LM Disease: #NSTEMI: Patient presented with chest pain found to have trop 116>126 with subtle STD in lateral leads. TTE with LVEF 60-65%, G1DD, normal RV, mild LAE, mild MR, aortic sclerosis. LHC on 1/22 showed 99% critical LM disease prompting emergent CABG where she underwent LIMA-LAD and SVG-OM. Currently doing well this morning. Tolerating IABP wean with plans to pull IABP this AM. Will continue  post-op care per CV anesthesia. -Plan to pull IABP today -Start ASA -Continue metop 12.'5mg'$  BID -Start crestor '20mg'$  daily -CT management per CT surgery -Will add GDMT as able  #Respiratory Failure: Intubated for the OR. Responding appropriately to questions today. Weaning vent as tolerated.  #HLD: -Start crestor '20mg'$  daily as above  CRITICAL CARE TIME: I have spent a total of 35 minutes with patient reviewing hospital notes, telemetry, EKGs, labs and examining the patient as well as establishing an assessment and plan that was discussed with the patient.  > 50% of time was spent in direct patient care. The patient is critically ill with multi-organ system failure and requires high complexity decision making for assessment and support, frequent evaluation and titration of therapies, application of advanced monitoring technologies and extensive interpretation of multiple databases.   For questions or updates, please contact [place] Please consult [email or web address] for contact info under        Signed, Freada Bergeron, MD  03/20/2022, 8:09 AM

## 2022-03-20 NOTE — Discharge Summary (Incomplete Revision)
[address]       [place] [address]             [phone]    Physician Discharge Summary  Patient ID: Alice [name] MRN: [id number] DOB/AGE: [birth year] 89 y.o.  Admit date: 03/19/2022 Discharge date: 04/02/2022  Admission Diagnoses:  Patient Active Problem List   Diagnosis Date Noted   NSTEMI (non-ST elevated myocardial infarction) ([place]) 03/19/2022   S/P CABG x 2 03/19/2022   Depression, recurrent ([place]) 02/01/2020   Atherosclerosis of aorta ([place]) 09/15/2018   Iron deficiency anemia due to chronic blood loss 08/12/2016   Mild anemia 07/06/2016   Left torticollis 08/11/2015   Hypothyroidism due to acquired atrophy of thyroid 08/11/2015   Internal and external bleeding hemorrhoids 08/11/2015   Osteoarthritis of right hip 09/10/2014   Status post total replacement of right hip 09/10/2014   Malignant neoplasm of upper-outer quadrant of right breast in female, estrogen receptor positive ([place]) 01/07/2013   Arthralgia 12/08/2012   Anxiety 02/12/2012   Cat bite of finger 08/10/2011   Cellulitis 08/10/2011     Discharge Diagnoses:  Patient Active Problem List   Diagnosis Date Noted   NSTEMI (non-ST elevated myocardial infarction) ([place]) 03/19/2022   S/P CABG x 2 03/19/2022   Depression, recurrent ([place]) 02/01/2020   Atherosclerosis of aorta ([place]) 09/15/2018   Iron deficiency anemia due to chronic blood loss 08/12/2016   Mild anemia 07/06/2016   Left torticollis 08/11/2015   Hypothyroidism due to acquired atrophy of thyroid 08/11/2015   Internal and external bleeding hemorrhoids 08/11/2015   Osteoarthritis of right hip 09/10/2014   Status post total replacement of right hip 09/10/2014   Malignant neoplasm of upper-outer quadrant of right breast in female, estrogen receptor positive ([place]) 01/07/2013   Arthralgia 12/08/2012   Anxiety 02/12/2012   Cat bite of finger 08/10/2011   Cellulitis 08/10/2011     Discharged Condition: stable  History of  Present Illness: Alice [name] is an 89 y.o. female.  HPI: 89 yo woman with PMH of breast cancer, hyperlipidemia, hypothyroidism, diverticulitis, and degenerative joint disease who presented to the ED earlier this morning with unstable CP. She ruled in for nonSTEMI.  In cath lab today she was found to have critical left main disease and had chest pain requiring IABP placement.  Currently pain resolved.   CABG indicated for survival benefit and relief of symptoms.    Hospital Course: [name] was taken directly from the Cath Lab to the operating room where an emergency CABG x 2 was performed.  The left internal mammary artery was grafted to the left anterior descending coronary artery and endoscopically harvested saphenous vein was grafted to the obtuse marginal coronary artery.  Following the procedure, she separated from cardiopulmonary bypass without difficulty and required no inotropic or pressor support. The IABP remained in place immediately post-op and she was transferred to the surgical ICU in stable condition.  She was initially AAI paced. IABP was removed on post op day one. Gordy Councilman, a line, chest tubes, and foley were all removed early in her post operative course.  She had mild thrombocytopenia post op. Her platelet count went as low as 97,000. Thrombocytopenia did improve as last platelet count was up to 202,000. Blood pressure was labile initially post op but improved with Midodrine 10 mg tid.She had expected post op blood loss anemia. She did not require a post op transfusion. She developed swelling and erythema antecubital left forearm;phlebitis  from IV. She was given warm compresses and Keflex. She was transitioned off the Insulin drip. Her pre op HGA1C is 5.7. She likely has pre diabetes. Once she is transferred from the ICU to the floor, accu checks and sliding scale will be stopped. She had confusion post op and this did resolve later in her post op course. She will be provided with  nutritional information with her discharge paperwork. She was felt surgically stable for transfer from the ICU to 4E for further convalescence on 01/25.  Midodrine was stopped and she was started on low dose Lopressor.  PA/LAT CXR was done 01/29 and showed persistent left pleural effusion and atelectasis. IR was asked to do a left thoracentesis;700 cc was removed. Follow up CXR showed no pneumothorax, persistent small pleural effusions.She required a few liters of oxygen via [place]. Because of continued desaturation, she will need 2 liters of oxygen at discharge. Lisinopril was started for hypertension on 01/29. She had brief atrial tachycardia (vs a flutter 2:1 block on Sunday 01/28. She had another brief episode of atrial tachycardia and ventricular trigeminy on 01/29. Cardiology titrated Lopressor to 50 mg bid on 01/30 and did not feel Amiodarone was needed at this time. Lisinopril was titrated to 20 mg daily for better BP control. She appeared euvolemic so she will not be discharged on Lasix/potassium. PT/OT initially recommended home PT/OT but later changed to SNF.Sternal and RLE wounds are clean, dry, and healing without signs of infection. She lives alone and will need SNF at discharge.  Consults: None  Significant Diagnostic Studies:   Narrative & Impression  CLINICAL DATA:  Status post CABG.   EXAM: CHES Narrative & Impression  CLINICAL DATA:  Pleural effusion per order.   EXAM: CHEST - 2 VIEW   COMPARISON:  03/26/2022   FINDINGS: Small bilateral pleural effusions left greater than right. Patchy airspace opacities at the lung bases left greater than right largely stable. Mild central pulmonary vascular congestion probably partially improved.   Heart size upper limits normal. Aortic Atherosclerosis (ICD10-170.0). CABG graft marker.   No pneumothorax.   Sternotomy wires.   IMPRESSION: Persistent small effusions and bibasilar airspace disease, left greater than right.      Electronically Signed   By: Lucrezia Europe M.D.   On: 03/27/2022 08:10     Narrative & Impression  CLINICAL DATA:  Pleural effusion   EXAM: PORTABLE CHEST 1 VIEW   COMPARISON:  Chest x-ray 03/26/2022   FINDINGS: There is a small left pleural effusion which has decreased when compared to prior study. There is minimal atelectasis in the left lower lung. There is no pneumothorax. The cardiomediastinal silhouette is stable. Patient is status post cardiac surgery. Osseous structures are unchanged.   IMPRESSION: Decreasing small left pleural effusion.     Electronically Signed   By: Ronney Asters M.D.   On: 03/26/2022 15:51    T - 2 VIEW   COMPARISON:  03/22/2022.   FINDINGS: 0444 hours.   Low lung volumes accentuate the pulmonary vasculature and cardiomediastinal silhouette. Slightly increased pulmonary venous congestion. Unchanged small left and trace right pleural effusions. No pneumothorax. Stable cardiac and mediastinal contours status post median sternotomy and CABG. Aortic atherosclerosis.   IMPRESSION: Slightly increased pulmonary venous congestion with unchanged small left and trace right pleural effusions.   Aortic Atherosclerosis (ICD10-I70.0).     Electronically Signed   By: Emmit Alexanders M.D.   On: 03/23/2022 07:59    Treatments: surgery:  Emergency median sternotomy, extracorporeal  circulation, coronary artery bypass grafting x 2 (left internal mammary artery to LAD, saphenous vein graft to obtuse marginal), endoscopic vein harvest, right leg by Dr. Roxan Hockey on 03/19/2022.  Discharge Exam: Blood pressure (!) 142/78, pulse 63, temperature 98 F (36.7 C), temperature source Oral, resp. rate 18, height '5\' 2"'$  (1.575 m), weight 56.6 kg, SpO2 97 %.    Discharge Medications:  The patient has been discharged on:   1.Beta Blocker:  Yes [ x  ]                              No   [   ]                              If No, reason:  2.Ace  Inhibitor/ARB: Yes [   ]                                     No  [  x  ]                                     If No, reason:Labile BP  3.Statin:   Yes [  x ]                  No  [   ]                  If No, reason:  4.Ecasa:  Yes  [   x]                  No   [   ]                  If No, reason:  Patient had ACS upon admission:Yes  Plavix/P2Y12 inhibitor: Yes [   ]                                      No  [  x ]Advanced age, previous thrombocytopenia   Discharge Instructions     Amb Referral to Cardiac Rehabilitation   Complete by: As directed    Diagnosis:  CABG NSTEMI     CABG X ___: 2   After initial evaluation and assessments completed: Virtual Based Care may be provided alone or in conjunction with Phase 2 Cardiac Rehab based on patient barriers.: Yes   Intensive Cardiac Rehabilitation (ICR) [place] location only OR Traditional Cardiac Rehabilitation (TCR) *If criteria for ICR are not met will enroll in TCR [facility] only): Yes      Allergies as of 04/02/2022       Reactions   Amoxicillin Rash   Diphenhydramine Rash   "It is the red dye in Benadryl that I''m allergic to"   Naproxen Sodium Rash   "It is the blue dye in naproxen that I'm allergic to."   Lipitor [atorvastatin]    Unknown reaction   Meclizine Other (See Comments)   Worsening dizziness and blurred vision   Amlodipine Other (See Comments)   Dizzy   Cefdinir Other (See Comments)   Vertigo and headaches    Codeine Other (See  Comments)   unknown   Ferrous Sulfate    "tears belly up"   Methocarbamol Other (See Comments)   insomnia   Prednisone Other (See Comments)   vertigo and headaches   Skelaxin [metaxalone] Other (See Comments)   Gastric upset.    Tramadol Nausea Only   Hctz [hydrochlorothiazide] Itching     Med Rec must be completed prior to using this [facility]***        Durable Medical Equipment  (From admission, onward)           Start     Ordered   03/27/22 0722  For home use  only DME oxygen  Once       Question Answer Comment  Length of Need 6 Months   Mode or (Route) Nasal cannula   Liters per Minute 2   Frequency Continuous (stationary and portable oxygen unit needed)   Oxygen delivery system Gas      03/27/22 0722            Follow-up Information     [facility]. Go to.   Why: PA/LAT CXR to be taken one hour prior to office appointment. Please arrive by 11:45 am Contact information: [place]        [facility] [place]. Go on 04/24/2022.   Specialty: Cardiothoracic Surgery Why: Appointment time is at 1:00 pm Contact information: [place], [place], Lanice Schwab, NP. Go on 04/09/2022.   Specialty: Cardiology Why: Appointment time '@10'$ :05 am Contact information: [address] [place] [address] [phone]         Lauree Chandler, NP. Call.   Specialty: Geriatric Medicine Why: for a follow up appointment regarding further surveillance of HGA1C 5.7 (pre diabetes) Contact information: [place] [place] [address] [phone]                 Signed:  Nani Skillern, PA-C 04/02/2022, 7:22 AM

## 2022-03-20 NOTE — Discharge Summary (Addendum)
[address]       [place] [address]             [phone]    Physician Discharge Summary  Patient ID: Alice [name] MRN: [id number] DOB/AGE: [birth year] 89 y.o.  Admit date: 03/19/2022 Discharge date: 04/03/2022  Admission Diagnoses:  Patient Active Problem List   Diagnosis Date Noted   NSTEMI (non-ST elevated myocardial infarction) ([place]) 03/19/2022   S/P CABG x 2 03/19/2022   Depression, recurrent ([place]) 02/01/2020   Atherosclerosis of aorta ([place]) 09/15/2018   Iron deficiency anemia due to chronic blood loss 08/12/2016   Mild anemia 07/06/2016   Left torticollis 08/11/2015   Hypothyroidism due to acquired atrophy of thyroid 08/11/2015   Internal and external bleeding hemorrhoids 08/11/2015   Osteoarthritis of right hip 09/10/2014   Status post total replacement of right hip 09/10/2014   Malignant neoplasm of upper-outer quadrant of right breast in female, estrogen receptor positive ([place]) 01/07/2013   Arthralgia 12/08/2012   Anxiety 02/12/2012   Cat bite of finger 08/10/2011   Cellulitis 08/10/2011     Discharge Diagnoses:  Patient Active Problem List   Diagnosis Date Noted   NSTEMI (non-ST elevated myocardial infarction) ([place]) 03/19/2022   S/P CABG x 2 03/19/2022   Depression, recurrent ([place]) 02/01/2020   Atherosclerosis of aorta ([place]) 09/15/2018   Iron deficiency anemia due to chronic blood loss 08/12/2016   Mild anemia 07/06/2016   Left torticollis 08/11/2015   Hypothyroidism due to acquired atrophy of thyroid 08/11/2015   Internal and external bleeding hemorrhoids 08/11/2015   Osteoarthritis of right hip 09/10/2014   Status post total replacement of right hip 09/10/2014   Malignant neoplasm of upper-outer quadrant of right breast in female, estrogen receptor positive ([place]) 01/07/2013   Arthralgia 12/08/2012   Anxiety 02/12/2012   Cat bite of finger 08/10/2011   Cellulitis 08/10/2011     Discharged Condition: Stable  History of  Present Illness: Alice [name] is an 89 y.o. female.  HPI: 89 yo woman with PMH of breast cancer, hyperlipidemia, hypothyroidism, diverticulitis, and degenerative joint disease who presented to the ED earlier this morning with unstable CP. She ruled in for nonSTEMI.  In cath lab today she was found to have critical left main disease and had chest pain requiring IABP placement.  Currently pain resolved.   CABG indicated for survival benefit and relief of symptoms.    Hospital Course: [name] was taken directly from the Cath Lab to the operating room where an emergency CABG x 2 was performed.  The left internal mammary artery was grafted to the left anterior descending coronary artery and endoscopically harvested saphenous vein was grafted to the obtuse marginal coronary artery.  Following the procedure, she separated from cardiopulmonary bypass without difficulty and required no inotropic or pressor support. The IABP remained in place immediately post-op and she was transferred to the surgical ICU in stable condition.  She was initially AAI paced. IABP was removed on post op day one. Gordy Councilman, a line, chest tubes, and foley were all removed early in her post operative course.  She had mild thrombocytopenia post op. Her platelet count went as low as 97,000. Thrombocytopenia did improve as last platelet count was up to 202,000. Blood pressure was labile initially post op but improved with Midodrine 10 mg tid.She had expected post op blood loss anemia. She did not require a post op transfusion. She developed swelling and erythema antecubital left forearm;phlebitis  from IV. She was given warm compresses and Keflex. She was transitioned off the Insulin drip. Her pre op HGA1C is 5.7. She likely has pre diabetes. Once she is transferred from the ICU to the floor, accu checks and sliding scale will be stopped. She had confusion post op and this did resolve later in her post op course. She will be provided with  nutritional information with her discharge paperwork. She was felt surgically stable for transfer from the ICU to 4E for further convalescence on 01/25.  Midodrine was stopped and she was started on low dose Lopressor.  PA/LAT CXR was done 01/29 and showed persistent left pleural effusion and atelectasis. IR was asked to do a left thoracentesis;700 cc was removed. Follow up CXR showed no pneumothorax, persistent small pleural effusions.She required a few liters of oxygen via [place]. Because of continued desaturation, she will need 2 liters of oxygen at discharge. Lisinopril was started for hypertension on 01/29. She had brief atrial tachycardia (vs a flutter 2:1 block on Sunday 01/28. She had another brief episode of atrial tachycardia and ventricular trigeminy on 01/29. Cardiology titrated Lopressor to 50 mg bid on 01/30 and did not feel Amiodarone was needed at this time. Lisinopril was titrated to 20 mg daily for better BP control. She appeared euvolemic so she will not be discharged on Lasix/potassium. PT/OT initially recommended home PT/OT but later changed to SNF.Sternal and RLE wounds are clean, dry, and healing without signs of infection. She has a small hematoma above right EVH wound (no cellulitis, no need for antibiotic). She has not been consistently receiving Lopressor 50 mg bid so it was decreased to 37.5 mg bid. Lisinopril has been titrated to 40 mg daily. She lives alone and will need SNF at discharge.  Consults: None  Significant Diagnostic Studies:   Narrative & Impression  CLINICAL DATA:  Status post CABG.   EXAM: CHES Narrative & Impression  CLINICAL DATA:  Pleural effusion per order.   EXAM: CHEST - 2 VIEW   COMPARISON:  03/26/2022   FINDINGS: Small bilateral pleural effusions left greater than right. Patchy airspace opacities at the lung bases left greater than right largely stable. Mild central pulmonary vascular congestion probably partially improved.   Heart size upper  limits normal. Aortic Atherosclerosis (ICD10-170.0). CABG graft marker.   No pneumothorax.   Sternotomy wires.   IMPRESSION: Persistent small effusions and bibasilar airspace disease, left greater than right.     Electronically Signed   By: Lucrezia Europe M.D.   On: 03/27/2022 08:10     Narrative & Impression  CLINICAL DATA:  Pleural effusion   EXAM: PORTABLE CHEST 1 VIEW   COMPARISON:  Chest x-ray 03/26/2022   FINDINGS: There is a small left pleural effusion which has decreased when compared to prior study. There is minimal atelectasis in the left lower lung. There is no pneumothorax. The cardiomediastinal silhouette is stable. Patient is status post cardiac surgery. Osseous structures are unchanged.   IMPRESSION: Decreasing small left pleural effusion.     Electronically Signed   By: Ronney Asters M.D.   On: 03/26/2022 15:51    T - 2 VIEW   COMPARISON:  03/22/2022.   FINDINGS: 0444 hours.   Low lung volumes accentuate the pulmonary vasculature and cardiomediastinal silhouette. Slightly increased pulmonary venous congestion. Unchanged small left and trace right pleural effusions. No pneumothorax. Stable cardiac and mediastinal contours status post median sternotomy and CABG. Aortic atherosclerosis.   IMPRESSION: Slightly increased pulmonary venous congestion with  unchanged small left and trace right pleural effusions.   Aortic Atherosclerosis (ICD10-I70.0).     Electronically Signed   By: Emmit Alexanders M.D.   On: 03/23/2022 07:59    Treatments: surgery:  Emergency median sternotomy, extracorporeal circulation, coronary artery bypass grafting x 2 (left internal mammary artery to LAD, saphenous vein graft to obtuse marginal), endoscopic vein harvest, right leg by Dr. Roxan Hockey on 03/19/2022.  Discharge Exam: Blood pressure 102/64, pulse 62, temperature 97.6 F (36.4 C), temperature source Oral, resp. rate 18, height 5' 2"$  (1.575 m), weight 52.9 kg,  SpO2 96 %.    Discharge Medications:  The patient has been discharged on:   1.Beta Blocker:  Yes [ x  ]                              No   [   ]                              If No, reason:  2.Ace Inhibitor/ARB: Yes [   ]                                     No  [  x  ]                                     If No, reason:Labile BP  3.Statin:   Yes [  x ]                  No  [   ]                  If No, reason:  4.Ecasa:  Yes  [   x]                  No   [   ]                  If No, reason:  Patient had ACS upon admission:Yes  Plavix/P2Y12 inhibitor: Yes [   ]                                      No  [  x ]Advanced age, previous thrombocytopenia   Discharge Instructions     Amb Referral to Cardiac Rehabilitation   Complete by: As directed    Diagnosis:  CABG NSTEMI     CABG X ___: 2   After initial evaluation and assessments completed: Virtual Based Care may be provided alone or in conjunction with Phase 2 Cardiac Rehab based on patient barriers.: Yes   Intensive Cardiac Rehabilitation (ICR) [place] location only OR Traditional Cardiac Rehabilitation (TCR) *If criteria for ICR are not met will enroll in TCR [facility] only): Yes      Allergies as of 04/03/2022       Reactions   Amoxicillin Rash   Diphenhydramine Rash   "It is the red dye in Benadryl that I''m allergic to"   Naproxen Sodium Rash   "It is the blue dye in naproxen that I'm allergic to."   Lipitor [atorvastatin]    Unknown  reaction   Meclizine Other (See Comments)   Worsening dizziness and blurred vision   Amlodipine Other (See Comments)   Dizzy   Cefdinir Other (See Comments)   Vertigo and headaches    Codeine Other (See Comments)   unknown   Ferrous Sulfate    "tears belly up"   Methocarbamol Other (See Comments)   insomnia   Prednisone Other (See Comments)   vertigo and headaches   Skelaxin [metaxalone] Other (See Comments)   Gastric upset.    Tramadol Nausea Only   Hctz [hydrochlorothiazide]  Itching        Medication List     STOP taking these medications    ASPIRIN ADULT PO Replaced by: aspirin 81 MG chewable tablet       TAKE these medications    aspirin 81 MG chewable tablet Chew 4 tablets (324 mg total) by mouth daily. Replaces: ASPIRIN ADULT PO   fish oil-omega-3 fatty acids 1000 MG capsule Take 1 g by mouth daily.   levothyroxine 112 MCG tablet Commonly known as: SYNTHROID Take 112 mcg by mouth in the morning.   lisinopril 40 MG tablet Commonly known as: ZESTRIL Take 1 tablet (40 mg total) by mouth daily.   Metoprolol Tartrate 37.5 MG Tabs Take 1 tablet (37.5 mg total) by mouth 2 (two) times daily.   multivitamin tablet Take 1 tablet by mouth daily.   rosuvastatin 20 MG tablet Commonly known as: CRESTOR Take 1 tablet (20 mg total) by mouth daily.   traMADol 50 MG tablet Commonly known as: ULTRAM Take 1 tablet (50 mg total) by mouth every 6 (six) hours as needed for moderate pain.               Durable Medical Equipment  (From admission, onward)           Start     Ordered   03/27/22 0722  For home use only DME oxygen  Once       Question Answer Comment  Length of Need 6 Months   Mode or (Route) Nasal cannula   Liters per Minute 2   Frequency Continuous (stationary and portable oxygen unit needed)   Oxygen delivery system Gas      03/27/22 0722            Follow-up Information     [facility]. Go to.   Why: PA/LAT CXR to be taken one hour prior to office appointment. Please arrive by 11:45 am Contact information: [place]        [facility] [place]. Go on 04/24/2022.   Specialty: Cardiothoracic Surgery Why: Appointment time is at 1:00 pm Contact information: [place], [place], Lanice Schwab, NP. Go on 04/09/2022.   Specialty:  Cardiology Why: Appointment time @10$ :05 am Contact information: [place] [address] [phone]         Lauree Chandler, NP. Call.   Specialty: Geriatric Medicine Why: for a follow up appointment regarding further surveillance of HGA1C 5.7 (pre diabetes) Contact information: [place]. [place] [address] [phone]                 Signed:  Nani [place], PA-C 04/03/2022, 7:56 AM

## 2022-03-20 NOTE — Procedures (Signed)
Extubation Procedure Note  Patient Details:   Name: TINLEY ROUGHT DOB: 04-May-1930 MRN: [id number]   Airway Documentation:    Vent end date: 03/20/22 Vent end time: 1338   Evaluation  O2 sats: stable throughout Complications: No apparent complications Patient did tolerate procedure well. Bilateral Breath Sounds: Diminished, Clear   Yes  Patient extubated per rapid wean protocol. Positive cuff leak. No stridor noted. NIF -25, VC 700 ml. Vitals are stable on 3L [place]. RN at bedside.  Herbie Saxon Avril Busser 03/20/2022, 1:39 PM

## 2022-03-20 NOTE — Progress Notes (Signed)
Sheath Removal Note  Pre- VS Bp 71/38  Paced rhtyhm 80  Right DP pulse by doppler IABP sheath site level 0 no visible oozing-- sutures removed and IABP sheath-balloon removed as a unit  Manual hold for 25 min right femoral site [employer] addressed BP 60/40s with Surgeon Right DP comfirmed by doppler q28mn during manual hold.  Post sheath VS remain stable 121/63 HR paced 80 patient verbalizes understanding of post sheath removal instructions. Right DP confirmed by doppler Tegaderm dressing placed with site level 0.  Sherry RN assessed and confirms level 0

## 2022-03-20 NOTE — Progress Notes (Signed)
Rounding Note    Patient Name: Alice [name] Date of Encounter: 03/21/2022  [place] Cardiologist: Freada Bergeron, MD   Subjective   Has some nausea this morning. Mild tightness at the incision site.   Patient extubated and IABP successfully pulled yesterday Cr normal at 0.9 HgB 9.5 CT with 1070 output  Inpatient Medications    Scheduled Meds:  acetaminophen  1,000 mg Oral Q6H   Or   acetaminophen (TYLENOL) oral liquid 160 mg/5 mL  1,000 mg Per Tube Q6H   bisacodyl  10 mg Oral Daily   Or   bisacodyl  10 mg Rectal Daily   Chlorhexidine Gluconate Cloth  6 each Topical Daily   docusate  200 mg Per Tube Daily   enoxaparin (LOVENOX) injection  40 mg Subcutaneous QHS   insulin aspart  0-9 Units Subcutaneous TID WC   levothyroxine  112 mcg Per Tube q AM   metoprolol tartrate  12.5 mg Oral BID   Or   metoprolol tartrate  12.5 mg Per Tube BID   omega-3 acid ethyl esters  1 g Oral Daily   pantoprazole  40 mg Oral Daily   rosuvastatin  20 mg Per Tube Daily   sodium chloride flush  3 mL Intravenous Q12H   Continuous Infusions:  sodium chloride     sodium chloride     sodium chloride 0 mL/hr at 03/19/22 2343   sodium chloride Stopped (03/20/22 0102)    ceFAZolin (ANCEF) IV Stopped (03/21/22 0630)   dexmedetomidine (PRECEDEX) IV infusion Stopped (03/20/22 1325)   lactated ringers     lactated ringers 20 mL/hr at 03/21/22 0700   lactated ringers Stopped (03/21/22 0641)   nitroGLYCERIN Stopped (03/20/22 0648)   phenylephrine (NEO-SYNEPHRINE) Adult infusion Stopped (03/21/22 0617)   PRN Meds: sodium chloride, acetaminophen, lactated ringers, metoprolol tartrate, midazolam, ondansetron (ZOFRAN) IV, mouth rinse, oxyCODONE, sodium chloride flush   Vital Signs    Vitals:   03/21/22 0615 03/21/22 0630 03/21/22 0645 03/21/22 0700  BP: 91/67 (!) 109/92 (!) 104/53 (!) 115/55  Pulse: 79 79 74 81  Resp: 19 (!) '30 13 18  '$ Temp:      TempSrc:      SpO2: 97%  97% 90% 91%  Weight:      Height:        Intake/Output Summary (Last 24 hours) at 03/21/2022 0759 Last data filed at 03/21/2022 0700 Gross per 24 hour  Intake 2444.63 ml  Output 1820 ml  Net 624.63 ml      03/20/2022    5:00 AM 03/19/2022    6:16 AM 02/01/2020   10:00 AM  Last 3 Weights  Weight (lbs) 150 lb 5.7 oz 120 lb 128 lb 12.8 oz  Weight (kg) 68.2 kg 54.432 kg 58.423 kg      Telemetry    NSR with intermittent v-pacing - Personally Reviewed  ECG    NSR - Personally Reviewed  Physical Exam   GEN: Awake and alert. Responding appropriately to questions Neck: No JVD Cardiac: RRR, 2/6 systolic murmur Respiratory: Diminished at the bases bilaterally. CT in place GI: Soft, nontender, non-distended  MS: No edema; No deformity. Right femoral access site c/d/I without hematoma Neuro:  Grossly nonfocal Psych: Normal affect   Labs    High Sensitivity Troponin:   Recent Labs  Lab 03/19/22 0628 03/19/22 0815  TROPONINIHS 116* 126*     Chemistry Recent Labs  Lab 03/20/22 0335 03/20/22 4580 03/20/22 1509 03/20/22 1819 03/21/22 0424  NA 141   < > 142 140 139  K 3.8   < > 4.2 3.9 4.2  CL 110  --   --  110 107  CO2 25  --   --  23 24  GLUCOSE 97  --   --  126* 119*  BUN 15  --   --  14 17  CREATININE 0.78  --   --  0.81 0.90  CALCIUM 8.0*  --   --  8.4* 8.3*  MG 3.1*  --   --  2.3  --   GFRNONAA >60  --   --  >60 >60  ANIONGAP 6  --   --  7 8   < > = values in this interval not displayed.    Lipids  Recent Labs  Lab 03/19/22 0815  CHOL 238*  TRIG 42  HDL 80  LDLCALC 150*  CHOLHDL 3.0    Hematology Recent Labs  Lab 03/20/22 0335 03/20/22 0652 03/20/22 1509 03/20/22 1819 03/21/22 0424  WBC 9.6  --   --  13.4* 12.4*  RBC 3.52*  --   --  3.67* 3.29*  HGB 10.1*   < > 10.5* 10.7* 9.5*  HCT 30.7*   < > 31.0* 31.6* 29.4*  MCV 87.2  --   --  86.1 89.4  MCH 28.7  --   --  29.2 28.9  MCHC 32.9  --   --  33.9 32.3  RDW 16.0*  --   --  16.4* 16.6*  PLT  97*  --   --  120* 110*   < > = values in this interval not displayed.   Thyroid No results for input(s): "TSH", "FREET4" in the last 168 hours.  BNPNo results for input(s): "BNP", "PROBNP" in the last 168 hours.  DDimer No results for input(s): "DDIMER" in the last 168 hours.   Radiology    DG Chest Port 1 View  Result Date: 03/20/2022 CLINICAL DATA:  Follow-up CABG EXAM: PORTABLE CHEST 1 VIEW COMPARISON:  03/19/2022 FINDINGS: Previous median sternotomy and CABG. Endotracheal tube tip 3 cm above the carina. Nasogastric tube enters the stomach. Swan-Ganz catheter tip in the main pulmonary artery. No pulmonary edema. Minimal atelectasis at the left lung base. No pneumothorax. IMPRESSION: Lines and tubes well positioned. No pneumothorax. Minimal atelectasis at the left lung base. Electronically Signed   By: Nelson Chimes M.D.   On: 03/20/2022 08:20   DG CHEST PORT 1 VIEW  Addendum Date: 03/20/2022   ADDENDUM REPORT: 03/20/2022 01:18 ADDENDUM: The side port of the enteric tube is in the proximal stomach just distal to the GE junction. The tip of the enteric tube is in the left upper abdomen likely in the region of the gastric fundus or proximal body. Electronically Signed   By: Anner Crete M.D.   On: 03/20/2022 01:18   Result Date: 03/20/2022 CLINICAL DATA:  Balloon pump adjustment. EXAM: PORTABLE CHEST 1 VIEW COMPARISON:  Earlier chest radiograph dated 03/19/2022. FINDINGS: Interval advancement of intra-aortic balloon pump with radiopaque tip projecting over the left hilum. Additional support apparatus in stable position. No interval change in the appearance of the lungs since the earlier radiograph. IMPRESSION: Interval advancement of intra-aortic balloon pump with radiopaque tip projecting over the left hilum. Electronically Signed: By: Anner Crete M.D. On: 03/20/2022 00:52   DG Abd 1 View  Addendum Date: 03/20/2022   ADDENDUM REPORT: 03/20/2022 01:18 ADDENDUM: The side port of the  enteric tube is in  the proximal stomach just distal to the GE junction. The tip of the enteric tube is in the left upper abdomen likely in the region of the gastric fundus or proximal body. Electronically Signed   By: Anner Crete M.D.   On: 03/20/2022 01:18   Result Date: 03/20/2022 CLINICAL DATA:  Balloon pump adjustment. EXAM: PORTABLE CHEST 1 VIEW COMPARISON:  Earlier chest radiograph dated 03/19/2022. FINDINGS: Interval advancement of intra-aortic balloon pump with radiopaque tip projecting over the left hilum. Additional support apparatus in stable position. No interval change in the appearance of the lungs since the earlier radiograph. IMPRESSION: Interval advancement of intra-aortic balloon pump with radiopaque tip projecting over the left hilum. Electronically Signed: By: Anner Crete M.D. On: 03/20/2022 00:52   DG Chest Port 1 View  Result Date: 03/19/2022 CLINICAL DATA:  Status post CABG. EXAM: PORTABLE CHEST 1 VIEW COMPARISON:  March 19, 2022 FINDINGS: An endotracheal tube is seen with its distal tip approximately 2.8 cm from the carina. An orogastric tube is noted, with its distal end extending below the level of the diaphragm. A right internal jugular Swan-Ganz catheter is noted with its distal tip overlying the medial aspect of the right hilum. Bilateral chest tubes are in place. Multiple sternal wires and vascular clips are seen. The heart size and mediastinal contours are within normal limits. There is marked severity calcification of the aortic arch. There is no evidence of an acute infiltrate, pleural effusion or pneumothorax. The visualized skeletal structures are unremarkable. IMPRESSION: 1. Multiple support line placement and positioning, as described above, with additional findings consistent with interval median sternotomy/CABG since the prior study. Electronically Signed   By: Virgina Norfolk M.D.   On: 03/19/2022 23:01   EP STUDY  Result Date: 03/19/2022 See surgical  note for result.  ECHO INTRAOPERATIVE TEE  Result Date: 03/19/2022  *INTRAOPERATIVE TRANSESOPHAGEAL REPORT *  Patient Name:   AVYN ADEN [facility] Date of Exam: 03/19/2022 Medical Rec #:  [id number]       Height:       62.0 in Accession #:    [id number]      Weight:       120.0 lb Date of Birth:  1930-09-08        BSA:          1.54 m Patient Age:    77 years        BP:           125/78 mmHg Patient Gender: F               HR:           78 bpm. Exam Location:  Anesthesiology Transesophogeal exam was perform intraoperatively during surgical procedure. Patient was closely monitored under general anesthesia during the entirety of examination. Indications:     CABG Performing Phys: Adele Barthel MD Diagnosing Phys: Adele Barthel MD Complications: No known complications during this procedure. POST-OP IMPRESSIONS _ Left Ventricle: The left ventricle is unchanged from pre-bypass. _ Right Ventricle: The right ventricle appears unchanged from pre-bypass. _ Aorta: The aorta appears unchanged from pre-bypass. _ Left Atrial Appendage: The left atrial appendage appears unchanged from pre-bypass. _ Aortic Valve: The aortic valve appears unchanged from pre-bypass. _ Mitral Valve: The mitral valve appears unchanged from pre-bypass. _ Tricuspid Valve: The tricuspid valve appears unchanged from pre-bypass. _ Pulmonic Valve: The pulmonic valve appears unchanged from pre-bypass. _ Interatrial Septum: The interatrial septum appears unchanged from pre-bypass. PRE-OP FINDINGS  Left Ventricle: The  left ventricle has normal systolic function, with an estimated ejection fraction of 55-60%. The cavity size was normal. Right Ventricle: The right ventricle has normal systolic function. The cavity was normal. There is no increase in right ventricular wall thickness. Left Atrium: Left atrial size was normal in size. No left atrial/left atrial appendage thrombus was detected. Right Atrium: Right atrial size was normal in size. Interatrial Septum: No  atrial level shunt detected by color flow Doppler. Pericardium: There is no evidence of pericardial effusion. Mitral Valve: The mitral valve is normal in structure. Mitral valve regurgitation is mild by color flow Doppler. Tricuspid Valve: The tricuspid valve was normal in structure. Tricuspid valve regurgitation is mild by color flow Doppler. Aortic Valve: The aortic valve is normal in structure. Aortic valve regurgitation was not visualized by color flow Doppler. There is mild thickening and mild calcification present on the aortic valve right coronary, left coronary and non-coronary cusps. Mobility of the left and right coronary cusps is mildly decreased. Pulmonic Valve: The pulmonic valve was normal in structure. Pulmonic valve regurgitation is trivial by color flow Doppler. Aorta: The aortic root, ascending aorta and aortic arch are normal in size and structure. The descending aorta was not well visualized due to the placement of balloon pump. The IABP terminates in the descending aorta.  Adele Barthel MD Electronically signed by Adele Barthel MD Signature Date/Time: 03/19/2022/9:50:20 PM    Final    CARDIAC CATHETERIZATION  Result Date: 03/19/2022 Images from the original result were not included.   Mid LM to Dist LM lesion is 99% stenosed.   Prox RCA lesion is 90% stenosed.   Mid LAD lesion is 60% stenosed. Fatima LAILIE SMEAD is a 89 y.o. female  [id number] LOCATION:  FACILITY: [facility] PHYSICIAN: Quay Burow, M.D. 11-Feb-1931 DATE OF PROCEDURE:  03/19/2022 DATE OF DISCHARGE: CARDIAC CATHETERIZATION History obtained from chart review.Tammra NATISHA TRZCINSKI is a 89 y.o. female with a hx of HLD, prior tobacco abuse, hypothyroidism and history of breast cancer who is being seen 03/19/2022 for the evaluation of chest pain and elevated troponin at the request of Dr. Gustavus Messing. Her troponins were not mildly elevated and she had subtle lateral ST segment depression.  She was seen by Dr. Johney Frame who felt that she should undergo  coronary angiography to define her anatomy.   Ms. Slinker has left main/three-vessel disease.  She is having active angina.  I placed an intra-aortic balloon pump.  I bolused her with 7000 units of heparin and placed her on a heparin drip.  Dr. Roxan Hockey has seen her and has agreed to take her for coronary artery bypass graft grafting urgently. Quay Burow. MD, [facility] 03/19/2022 4:30 PM    ECHOCARDIOGRAM COMPLETE  Result Date: 03/19/2022    ECHOCARDIOGRAM REPORT   Patient Name:   SHALIYAH TAITE [facility] Date of Exam: 03/19/2022 Medical Rec #:  [id number]       Height:       62.0 in Accession #:    [id number]      Weight:       120.0 lb Date of Birth:  1931-01-06        BSA:          1.539 m Patient Age:    32 years        BP:           137/61 mmHg Patient Gender: F               HR:  74 bpm. Exam Location:  Inpatient Procedure: 2D Echo, Cardiac Doppler, Color Doppler and Intracardiac            Opacification Agent Indications:    Chest Pain  History:        Patient has no prior history of Echocardiogram examinations.                 Signs/Symptoms:Shortness of Breath and Chest Pain; Risk                 Factors:Dyslipidemia and Former Smoker. Hx of breast cancer.  Sonographer:    Eartha Inch Referring Phys: [id number] Cola Highfill E Sabastion Hrdlicka  Sonographer Comments: Image acquisition challenging due to patient body habitus and Image acquisition challenging due to respiratory motion. IMPRESSIONS  1. Left ventricular ejection fraction, by estimation, is 60 to 65%. The left ventricle has normal function. The left ventricle has no regional wall motion abnormalities. Left ventricular diastolic parameters are consistent with Grade I diastolic dysfunction (impaired relaxation).  2. Right ventricular systolic function is normal. The right ventricular size is normal. There is mildly elevated pulmonary artery systolic pressure.  3. Left atrial size was mildly dilated.  4. Large calcium deposit on PMVL.Marland Kitchen The mitral valve is  degenerative. Mild mitral valve regurgitation. No evidence of mitral stenosis. Moderate mitral annular calcification.  5. The aortic valve is normal in structure. Aortic valve regurgitation is not visualized. Aortic valve sclerosis is present, with no evidence of aortic valve stenosis. Aortic valve area, by VTI measures 1.44 cm. Aortic valve mean gradient measures 8.0 mmHg. Aortic valve Vmax measures 1.81 m/s.  6. The inferior vena cava is dilated in size with >50% respiratory variability, suggesting right atrial pressure of 8 mmHg. FINDINGS  Left Ventricle: Left ventricular ejection fraction, by estimation, is 60 to 65%. The left ventricle has normal function. The left ventricle has no regional wall motion abnormalities. The left ventricular internal cavity size was normal in size. There is  no left ventricular hypertrophy. Left ventricular diastolic parameters are consistent with Grade I diastolic dysfunction (impaired relaxation). Indeterminate filling pressures. Right Ventricle: The right ventricular size is normal. No increase in right ventricular wall thickness. Right ventricular systolic function is normal. There is mildly elevated pulmonary artery systolic pressure. The tricuspid regurgitant velocity is 2.71  m/s, and with an assumed right atrial pressure of 8 mmHg, the estimated right ventricular systolic pressure is 40.8 mmHg. Left Atrium: Left atrial size was mildly dilated. Right Atrium: Right atrial size was normal in size. Pericardium: There is no evidence of pericardial effusion. Mitral Valve: Large calcium deposit on PMVL. The mitral valve is degenerative in appearance. There is mild thickening of the mitral valve leaflet(s). Moderate mitral annular calcification. Mild mitral valve regurgitation. No evidence of mitral valve stenosis. MV peak gradient, 7.6 mmHg. The mean mitral valve gradient is 4.0 mmHg. Tricuspid Valve: The tricuspid valve is normal in structure. Tricuspid valve regurgitation is  mild . No evidence of tricuspid stenosis. Aortic Valve: The aortic valve is normal in structure. Aortic valve regurgitation is not visualized. Aortic valve sclerosis is present, with no evidence of aortic valve stenosis. Aortic valve mean gradient measures 8.0 mmHg. Aortic valve peak gradient measures 13.1 mmHg. Aortic valve area, by VTI measures 1.44 cm. Pulmonic Valve: The pulmonic valve was normal in structure. Pulmonic valve regurgitation is not visualized. No evidence of pulmonic stenosis. Aorta: The aortic root and ascending aorta are structurally normal, with no evidence of dilitation. Venous: The inferior vena cava is  dilated in size with greater than 50% respiratory variability, suggesting right atrial pressure of 8 mmHg. IAS/Shunts: No atrial level shunt detected by color flow Doppler.  LEFT VENTRICLE PLAX 2D LVIDd:         4.40 cm     Diastology LVIDs:         3.50 cm     LV e' medial:    4.90 cm/s LV PW:         0.60 cm     LV E/e' medial:  21.6 LV IVS:        0.40 cm     LV e' lateral:   8.59 cm/s LVOT diam:     1.70 cm     LV E/e' lateral: 12.3 LV SV:         58 LV SV Index:   37 LVOT Area:     2.27 cm  LV Volumes (MOD) LV vol d, MOD A2C: 61.7 ml LV vol d, MOD A4C: 64.4 ml LV vol s, MOD A2C: 25.4 ml LV vol s, MOD A4C: 22.3 ml LV SV MOD A2C:     36.3 ml LV SV MOD A4C:     64.4 ml LV SV MOD BP:      42.6 ml RIGHT VENTRICLE             IVC RV S prime:     13.80 cm/s  IVC diam: 2.10 cm TAPSE (M-mode): 1.5 cm LEFT ATRIUM             Index        RIGHT ATRIUM           Index LA diam:        4.00 cm 2.60 cm/m   RA Area:     10.20 cm LA Vol (A2C):   50.6 ml 32.89 ml/m  RA Volume:   19.20 ml  12.48 ml/m LA Vol (A4C):   49.8 ml 32.37 ml/m LA Biplane Vol: 50.7 ml 32.95 ml/m  AORTIC VALVE AV Area (Vmax):    1.27 cm AV Area (Vmean):   1.28 cm AV Area (VTI):     1.44 cm AV Vmax:           181.00 cm/s AV Vmean:          141.000 cm/s AV VTI:            0.401 m AV Peak Grad:      13.1 mmHg AV Mean Grad:       8.0 mmHg LVOT Vmax:         101.00 cm/s LVOT Vmean:        79.600 cm/s LVOT VTI:          0.254 m LVOT/AV VTI ratio: 0.63  AORTA Ao Root diam: 2.90 cm Ao Asc diam:  2.90 cm MITRAL VALVE                  TRICUSPID VALVE MV Area (PHT): 2.65 cm       TR Peak grad:   29.4 mmHg MV Area VTI:   1.43 cm       TR Mean grad:   18.0 mmHg MV Peak grad:  7.6 mmHg       TR Vmax:        271.00 cm/s MV Mean grad:  4.0 mmHg       TR Vmean:       203.0 cm/s MV Vmax:       1.38 m/s  MV Vmean:      92.3 cm/s      SHUNTS MV Decel Time: 286 msec       Systemic VTI:  0.25 m MR Peak grad:    89.1 mmHg    Systemic Diam: 1.70 cm MR Mean grad:    64.0 mmHg MR Vmax:         472.00 cm/s MR Vmean:        383.0 cm/s MR PISA:         0.57 cm MR PISA Eff ROA: 5 mm MR PISA Radius:  0.30 cm MV E velocity: 106.00 cm/s MV A velocity: 128.00 cm/s MV E/A ratio:  0.83 Skeet Latch MD Electronically signed by Skeet Latch MD Signature Date/Time: 03/19/2022/3:47:36 PM    Final     Cardiac Studies   LHC 03/19/22:   Mid LM to Dist LM lesion is 99% stenosed.   Prox RCA lesion is 90% stenosed.   Mid LAD lesion is 60% stenosed.   Intervention   TTE 03/19/22: IMPRESSIONS     1. Left ventricular ejection fraction, by estimation, is 60 to 65%. The  left ventricle has normal function. The left ventricle has no regional  wall motion abnormalities. Left ventricular diastolic parameters are  consistent with Grade I diastolic  dysfunction (impaired relaxation).   2. Right ventricular systolic function is normal. The right ventricular  size is normal. There is mildly elevated pulmonary artery systolic  pressure.   3. Left atrial size was mildly dilated.   4. Large calcium deposit on PMVL.Marland Kitchen The mitral valve is degenerative. Mild  mitral valve regurgitation. No evidence of mitral stenosis. Moderate  mitral annular calcification.   5. The aortic valve is normal in structure. Aortic valve regurgitation is  not visualized. Aortic valve  sclerosis is present, with no evidence of  aortic valve stenosis. Aortic valve area, by VTI measures 1.44 cm. Aortic  valve mean gradient measures 8.0  mmHg. Aortic valve Vmax measures 1.81 m/s.   6. The inferior vena cava is dilated in size with >50% respiratory  variability, suggesting right atrial pressure of 8 mmHg.   Patient Profile     89 y.o. female with hx of HLD, prior tobacco abuse, hypothyroidism and history of breast cancer who presented with a NSTEMI found to have critical LM disease on cath prompting urgent CABG with LIMA-LAD and SVG-OM on 03/19/22.  Assessment & Plan    #Critical LM Disease: #NSTEMI: Patient presented with chest pain found to have trop 116>126 with subtle STD in lateral leads. TTE with LVEF 60-65%, G1DD, normal RV, mild LAE, mild MR, aortic sclerosis. LHC on 1/22 showed 99% critical LM disease prompting emergent CABG where she underwent LIMA-LAD and SVG-OM. IABP pulled and patient extubated on 1/23. -Extubated and IABP pulled 1/23 -Will start ASA '324mg'$  daily -Continue metop 12.'5mg'$  BID with holding parameters -Continue crestor '20mg'$  daily -CT management per CT surgery -Will add GDMT as able  #Respiratory Failure: Extubated on 03/20/22 and now on 3L [place].  -Wean O2 as tolerated -Continue incentive spirometer  #HLD: -Continue crestor '20mg'$  daily as above  For questions or updates, please contact [place] Please consult [email or web address] for contact info under        Signed, Freada Bergeron, MD  03/21/2022, 7:59 AM

## 2022-03-20 NOTE — Anesthesia Postprocedure Evaluation (Signed)
Anesthesia Post Note  Patient: HAYLY LITSEY  Procedure(s) Performed: CORONARY ARTERY BYPASS GRAFTING (CABG) X TWO, USING LEFT INTERNAL MAMMARY ARTERY AND RIGHT LEG GREATER SAPHENOUS VEIN HARVESTED ENDOSCOPICALLY (Chest) TRANSESOPHAGEAL ECHOCARDIOGRAM     Patient location during evaluation: SICU Anesthesia Type: General Level of consciousness: sedated Pain management: pain level controlled Vital Signs Assessment: post-procedure vital signs reviewed and stable Respiratory status: patient remains intubated per anesthesia plan Cardiovascular status: stable Postop Assessment: no apparent nausea or vomiting Anesthetic complications: no   No notable events documented.  Last Vitals:  Vitals:   03/20/22 0645 03/20/22 0700  BP: 116/64   Pulse: (!) 39 83  Resp: (!) 0 11  Temp: (!) 36.1 C (!) 36.1 C  SpO2: 95% 96%    Last Pain:  Vitals:   03/20/22 0400  TempSrc: Core  PainSc:                  Karyl Kinnier Rhondalyn Clingan

## 2022-03-20 NOTE — Progress Notes (Signed)
1 Day Post-Op Procedure(s) (LRB): CORONARY ARTERY BYPASS GRAFTING (CABG) X TWO, USING LEFT INTERNAL MAMMARY ARTERY AND RIGHT LEG GREATER SAPHENOUS VEIN HARVESTED ENDOSCOPICALLY (N/A) TRANSESOPHAGEAL ECHOCARDIOGRAM (N/A) Subjective: Intubated, sedated at present- has woken up and followed commands  Objective: Vital signs in last 24 hours: Temp:  [95 F (35 C)-97.8 F (36.6 C)] 96.4 F (35.8 C) (01/23 0615) Pulse Rate:  [65-108] 89 (01/23 0615) Cardiac Rhythm: A-V Sequential paced (01/23 0400) Resp:  [11-27] 16 (01/23 0615) BP: (90-190)/(41-130) 108/63 (01/23 0615) SpO2:  [86 %-100 %] 95 % (01/23 0615) Arterial Line BP: (65-195)/(24-43) 94/31 (01/23 0615) FiO2 (%):  [50 %] 50 % (01/23 0405) Weight:  [68.2 kg] 68.2 kg (01/23 0500)  Hemodynamic parameters for last 24 hours: PAP: (26-38)/(16-21) 26/16 CVP:  [5 mmHg-11 mmHg] 5 mmHg CO:  [1.8 L/min-2.4 L/min] 2.2 L/min CI:  [1.1 L/min/m2-1.6 L/min/m2] 1.5 L/min/m2  Intake/Output from previous day: 01/22 0701 - 01/23 0700 In: 6460.4 [I.V.:4329.3; Blood:615; IV Piggyback:1516.1] Out: 2125 [Urine:1390; Blood:545; Chest Tube:190] Intake/Output this shift: Total I/O In: 6047.4 [I.V.:4166.3; Blood:615; IV Piggyback:1266.1] Out: 1875 [Urine:1140; Blood:545; Chest Tube:190]  General appearance: alert, cooperative, and no distress Neurologic: intact Heart: regular rate and rhythm Lungs: clear to auscultation bilaterally Extremities: warm good cap refill  Lab Results: Recent Labs    03/19/22 2201 03/19/22 2229 03/20/22 0335  WBC 10.9*  --  9.6  HGB 10.9* 10.9* 10.1*  HCT 31.7* 32.0* 30.7*  PLT 97*  --  97*   BMET:  Recent Labs    03/19/22 0628 03/19/22 1800 03/19/22 2057 03/19/22 2102 03/19/22 2229 03/20/22 0335  NA 138   < > 139   < > 142 141  K 3.8   < > 5.5*   < > 4.1 3.8  CL 100   < > 104  --   --  110  CO2 27  --   --   --   --  25  GLUCOSE 123*   < > 105*  --   --  97  BUN 29*   < > 21  --   --  15  CREATININE  0.88   < > 0.40*  --   --  0.78  CALCIUM 9.2  --   --   --   --  8.0*   < > = values in this interval not displayed.    PT/INR:  Recent Labs    03/19/22 2201  LABPROT 17.6*  INR 1.5*   ABG    Component Value Date/Time   PHART 7.317 (L) 03/19/2022 2229   HCO3 24.1 03/19/2022 2229   TCO2 26 03/19/2022 2229   ACIDBASEDEF 3.0 (H) 03/19/2022 2229   O2SAT 65.1 03/20/2022 0200   CBG (last 3)  Recent Labs    03/20/22 0459 03/20/22 0616 03/20/22 0649  GLUCAP 163* 139* 137*    Assessment/Plan: S/P Procedure(s) (LRB): CORONARY ARTERY BYPASS GRAFTING (CABG) X TWO, USING LEFT INTERNAL MAMMARY ARTERY AND RIGHT LEG GREATER SAPHENOUS VEIN HARVESTED ENDOSCOPICALLY (N/A) TRANSESOPHAGEAL ECHOCARDIOGRAM (N/A) POD # 1 NEURO- follows commands CV- in SR, paced at 80  IABP down to 1:3 with no change in index  Index has been low throughout, even preop, co-ox 65  Dc IABP  ASA, beta blocker, statin RESP- wean vent RENAL- creatinine normal  Supplement K  Intravascularly dry- give albumin this AM ENDO- CBG mildly elevated  Transition to Levemir + SSI Gi- advance diet after extubation HEME- anemia secondary to ABL- mild, follow  Thrombocytopenia- mild,  monitor Dc chest tubes Mobilize   LOS: 1 day    Melrose Nakayama 03/20/2022

## 2022-03-21 ENCOUNTER — Inpatient Hospital Stay (HOSPITAL_COMMUNITY): Payer: Medicare PPO

## 2022-03-21 DIAGNOSIS — Z951 Presence of aortocoronary bypass graft: Secondary | ICD-10-CM | POA: Diagnosis not present

## 2022-03-21 DIAGNOSIS — I214 Non-ST elevation (NSTEMI) myocardial infarction: Secondary | ICD-10-CM | POA: Diagnosis not present

## 2022-03-21 LAB — BASIC METABOLIC PANEL
Anion gap: 8 (ref 5–15)
BUN: 17 mg/dL (ref 8–23)
CO2: 24 mmol/L (ref 22–32)
Calcium: 8.3 mg/dL — ABNORMAL LOW (ref 8.9–10.3)
Chloride: 107 mmol/L (ref 98–111)
Creatinine, Ser: 0.9 mg/dL (ref 0.44–1.00)
GFR, Estimated: >60 mL/min (ref >60)
Glucose, Bld: 119 mg/dL — ABNORMAL HIGH (ref 70–99)
Potassium: 4.2 mmol/L (ref 3.5–5.1)
Sodium: 139 mmol/L (ref 135–145)

## 2022-03-21 LAB — CBC
HCT: 29.4 % — ABNORMAL LOW (ref 36.0–46.0)
Hemoglobin: 9.5 g/dL — ABNORMAL LOW (ref 12.0–15.0)
MCH: 28.9 pg (ref 26.0–34.0)
MCHC: 32.3 g/dL (ref 30.0–36.0)
MCV: 89.4 fL (ref 80.0–100.0)
Platelets: 110 10*3/uL — ABNORMAL LOW (ref 150–400)
RBC: 3.29 MIL/uL — ABNORMAL LOW (ref 3.87–5.11)
RDW: 16.6 % — ABNORMAL HIGH (ref 11.5–15.5)
WBC: 12.4 10*3/uL — ABNORMAL HIGH (ref 4.0–10.5)
nRBC: 0 % (ref 0.0–0.2)

## 2022-03-21 LAB — GLUCOSE, CAPILLARY
Glucose-Capillary: 117 mg/dL — ABNORMAL HIGH (ref 70–99)
Glucose-Capillary: 107 mg/dL — ABNORMAL HIGH (ref 70–99)
Glucose-Capillary: 121 mg/dL — ABNORMAL HIGH (ref 70–99)
Glucose-Capillary: 148 mg/dL — ABNORMAL HIGH (ref 70–99)
Glucose-Capillary: 123 mg/dL — ABNORMAL HIGH (ref 70–99)

## 2022-03-21 LAB — LIPOPROTEIN A (LPA): Lipoprotein (a): 24.2 nmol/L (ref <75.0)

## 2022-03-21 MED ORDER — DOCUSATE SODIUM 100 MG PO CAPS
200.0000 mg | ORAL_CAPSULE | Freq: Every day | ORAL | Status: DC
  Administered 2022-03-21 – 2022-03-22 (×2): 200 mg via ORAL
  Filled 2022-03-21 (×2): qty 2

## 2022-03-21 MED ORDER — INSULIN ASPART 100 UNIT/ML IJ SOLN
0.0000 [IU] | Freq: Three times a day (TID) | INTRAMUSCULAR | Status: DC
  Administered 2022-03-21 (×2): 1 [IU] via SUBCUTANEOUS

## 2022-03-21 MED ORDER — OXYCODONE HCL 5 MG PO TABS: 5.0000 mg | ORAL_TABLET | Freq: Four times a day (QID) | ORAL | Status: DC | PRN

## 2022-03-21 MED ORDER — ASPIRIN 81 MG PO CHEW
324.0000 mg | CHEWABLE_TABLET | Freq: Every day | ORAL | Status: DC
  Administered 2022-03-21 – 2022-04-03 (×14): 324 mg via ORAL
  Filled 2022-03-21 (×17): qty 4

## 2022-03-21 MED ORDER — ROSUVASTATIN CALCIUM 20 MG PO TABS
20.0000 mg | ORAL_TABLET | Freq: Every day | ORAL | Status: DC
  Administered 2022-03-21 – 2022-04-03 (×14): 20 mg via ORAL
  Filled 2022-03-21 (×14): qty 1

## 2022-03-21 NOTE — Progress Notes (Signed)
Rounding Note    Patient Name: Alice [name] Date of Encounter: 03/22/2022  [facility] Cardiologist: Freada Bergeron, MD   Subjective   Sitting up in a chair. Complains of swelling and pain in LUE. Mild pain at incision site. Otherwise doing well.  HgB stable at 9  Inpatient Medications    Scheduled Meds:  acetaminophen  1,000 mg Oral Q6H   Or   acetaminophen (TYLENOL) oral liquid 160 mg/5 mL  1,000 mg Per Tube Q6H   aspirin  324 mg Oral Daily   bisacodyl  10 mg Oral Daily   Or   bisacodyl  10 mg Rectal Daily   Chlorhexidine Gluconate Cloth  6 each Topical Daily   docusate sodium  200 mg Oral Daily   enoxaparin (LOVENOX) injection  40 mg Subcutaneous QHS   insulin aspart  0-9 Units Subcutaneous TID WC   levothyroxine  112 mcg Per Tube q AM   metoCLOPramide (REGLAN) injection  10 mg Intravenous Q6H   metoprolol tartrate  12.5 mg Oral BID   Or   metoprolol tartrate  12.5 mg Per Tube BID   omega-3 acid ethyl esters  1 g Oral Daily   pantoprazole  40 mg Oral Daily   rosuvastatin  20 mg Oral Daily   sodium chloride flush  3 mL Intravenous Q12H   Continuous Infusions:  sodium chloride     sodium chloride     sodium chloride 10 mL/hr at 03/21/22 1800   sodium chloride     sodium chloride     sodium chloride Stopped (03/20/22 0102)   lactated ringers     lactated ringers Stopped (03/21/22 0853)   lactated ringers Stopped (03/21/22 0641)   PRN Meds: sodium chloride, acetaminophen, lactated ringers, metoprolol tartrate, midazolam, ondansetron (ZOFRAN) IV, mouth rinse, oxyCODONE, sodium chloride flush   Vital Signs    Vitals:   03/22/22 0400 03/22/22 0500 03/22/22 0600 03/22/22 0700  BP: (!) 111/52 (!) 109/55 (!) 114/54 (!) 101/48  Pulse: 84 82 83 80  Resp: '17 17 18 14  '$ Temp:      TempSrc:      SpO2: 94% 98% 98% 99%  Weight:  66.9 kg    Height:        Intake/Output Summary (Last 24 hours) at 03/22/2022 0756 Last data filed at 03/22/2022  0600 Gross per 24 hour  Intake 163.15 ml  Output 555 ml  Net -391.85 ml      03/22/2022    5:00 AM 03/20/2022    5:00 AM 03/19/2022    6:16 AM  Last 3 Weights  Weight (lbs) 147 lb 8 oz 150 lb 5.7 oz 120 lb  Weight (kg) 66.906 kg 68.2 kg 54.432 kg      Telemetry    NSR - Personally Reviewed  ECG    No new tracing today - Personally Reviewed  Physical Exam   GEN: Sitting up in a chair, comfortable Neck: No JVD Cardiac: RRR, 2/6 systolic murmur Respiratory: Diminished at the bases GI: Soft, nontender, non-distended  MS: LUE mildly swollen and erythematous Neuro:  Grossly nonfocal Psych: Normal affect   Labs    High Sensitivity Troponin:   Recent Labs  Lab 03/19/22 0628 03/19/22 0815  TROPONINIHS 116* 126*     Chemistry Recent Labs  Lab 03/20/22 0335 03/20/22 0652 03/20/22 1819 03/21/22 0424 03/22/22 0558  NA 141   < > 140 139 137  K 3.8   < > 3.9 4.2 4.0  CL 110  --  110 107 104  CO2 25  --  '23 24 26  '$ GLUCOSE 97  --  126* 119* 102*  BUN 15  --  14 17 24*  CREATININE 0.78  --  0.81 0.90 0.99  CALCIUM 8.0*  --  8.4* 8.3* 8.2*  MG 3.1*  --  2.3  --   --   GFRNONAA >60  --  >60 >60 55*  ANIONGAP 6  --  '7 8 7   '$ < > = values in this interval not displayed.    Lipids  Recent Labs  Lab 03/19/22 0815  CHOL 238*  TRIG 42  HDL 80  LDLCALC 150*  CHOLHDL 3.0    Hematology Recent Labs  Lab 03/20/22 1819 03/21/22 0424 03/22/22 0558  WBC 13.4* 12.4* 12.3*  RBC 3.67* 3.29* 3.07*  HGB 10.7* 9.5* 9.0*  HCT 31.6* 29.4* 27.3*  MCV 86.1 89.4 88.9  MCH 29.2 28.9 29.3  MCHC 33.9 32.3 33.0  RDW 16.4* 16.6* 16.6*  PLT 120* 110* 116*   Thyroid No results for input(s): "TSH", "FREET4" in the last 168 hours.  BNPNo results for input(s): "BNP", "PROBNP" in the last 168 hours.  DDimer No results for input(s): "DDIMER" in the last 168 hours.   Radiology    DG Chest Port 1 View  Result Date: 03/21/2022 CLINICAL DATA:  Follow-up CABG EXAM: PORTABLE CHEST 1  VIEW COMPARISON:  03/20/2022 FINDINGS: Endotracheal tube and nasogastric tube have been removed. Swan-Ganz catheter is been removed. Right internal jugular venous access sheath remains in place. Mediastinal drains remain in place. No pneumothorax. Mild atelectasis at the right lung base and moderate atelectasis at the left lung base. IMPRESSION: Endotracheal tube and nasogastric tube removed. Swan-Ganz catheter removed. No pneumothorax. Persistent basilar atelectasis left more than right. Electronically Signed   By: Nelson Chimes M.D.   On: 03/21/2022 08:10    Cardiac Studies   LHC 03/19/22:   Mid LM to Dist LM lesion is 99% stenosed.   Prox RCA lesion is 90% stenosed.   Mid LAD lesion is 60% stenosed.   Intervention   TTE 03/19/22: IMPRESSIONS     1. Left ventricular ejection fraction, by estimation, is 60 to 65%. The  left ventricle has normal function. The left ventricle has no regional  wall motion abnormalities. Left ventricular diastolic parameters are  consistent with Grade I diastolic  dysfunction (impaired relaxation).   2. Right ventricular systolic function is normal. The right ventricular  size is normal. There is mildly elevated pulmonary artery systolic  pressure.   3. Left atrial size was mildly dilated.   4. Large calcium deposit on PMVL.Marland Kitchen The mitral valve is degenerative. Mild  mitral valve regurgitation. No evidence of mitral stenosis. Moderate  mitral annular calcification.   5. The aortic valve is normal in structure. Aortic valve regurgitation is  not visualized. Aortic valve sclerosis is present, with no evidence of  aortic valve stenosis. Aortic valve area, by VTI measures 1.44 cm. Aortic  valve mean gradient measures 8.0  mmHg. Aortic valve Vmax measures 1.81 m/s.   6. The inferior vena cava is dilated in size with >50% respiratory  variability, suggesting right atrial pressure of 8 mmHg.   Patient Profile     89 y.o. female with hx of HLD, prior tobacco  abuse, hypothyroidism and history of breast cancer who presented with a NSTEMI found to have critical LM disease on cath prompting urgent CABG with LIMA-LAD and SVG-OM on  03/19/22.  Assessment & Plan    #Critical LM Disease: #NSTEMI: Patient presented with chest pain found to have trop 116>126 with subtle STD in lateral leads. TTE with LVEF 60-65%, G1DD, normal RV, mild LAE, mild MR, aortic sclerosis. LHC on 1/22 showed 99% critical LM disease prompting emergent CABG where she underwent LIMA-LAD and SVG-OM. IABP pulled and patient extubated on 1/23. -Extubated and IABP pulled 1/23 -Continue ASA '324mg'$  daily -Continue metop 12.'5mg'$  BID with holding parameters -Continue crestor '20mg'$  daily -Will add GDMT as able -Post-op care per CV surgery  #LUE Swelling: Patient has mild left forearm swelling with pain to palpation. A-line removed. Plan to remove PIV. Possible ABX per CT surgery.  #HLD: -Continue crestor '20mg'$  daily as above  For questions or updates, please contact [place] Please consult [email or web address] for contact info under        Signed, Freada Bergeron, MD  03/22/2022, 7:56 AM

## 2022-03-21 NOTE — Progress Notes (Signed)
[address]       [place] [address]             [phone]       EVENING ROUNDS  POD #2 sp emergent CABG x 2 Hemodynamics ok Confused Sitting in chair

## 2022-03-21 NOTE — Progress Notes (Addendum)
[facility] DAILY ICU PROGRESS NOTE                   [address]            [place] [address]          [phone]   2 Days Post-Op Procedure(s) (LRB): CORONARY ARTERY BYPASS GRAFTING (CABG) X TWO, USING LEFT INTERNAL MAMMARY ARTERY AND RIGHT LEG GREATER SAPHENOUS VEIN HARVESTED ENDOSCOPICALLY (N/A) TRANSESOPHAGEAL ECHOCARDIOGRAM (N/A)  Total Length of Stay:  LOS: 2 days   Subjective: Patient had some nausea earlier. She ate a little bit of jello.  Objective: Vital signs in last 24 hours: Temp:  [97.7 F (36.5 C)-98.7 F (37.1 C)] 98.7 F (37.1 C) (01/24 0317) Pulse Rate:  [27-92] 81 (01/24 0700) Cardiac Rhythm: Normal sinus rhythm (01/24 0400) Resp:  [0-30] 18 (01/24 0700) BP: (60-152)/(40-92) 115/55 (01/24 0700) SpO2:  [90 %-100 %] 91 % (01/24 0700) Arterial Line BP: (67-132)/(28-82) 88/62 (01/23 1845) FiO2 (%):  [50 %] 50 % (01/23 0811)  Filed Weights   03/19/22 0616 03/20/22 0500  Weight: 54.4 kg 68.2 kg    Hemodynamic parameters for last 24 hours: PAP: (28-44)/(19-23) 44/23 CVP:  [7 mmHg-11 mmHg] 11 mmHg CO:  [3.1 L/min] 3.1 L/min CI:  [2 L/min/m2] 2 L/min/m2  Intake/Output from previous day: 01/23 0701 - 01/24 0700 In: 2287.6 [I.V.:1434.3; NG/GT:80; IV Piggyback:773.3] Out: 7510 [Urine:605; Chest Tube:1070]  Intake/Output this shift: No intake/output data recorded.  Current Meds: Scheduled Meds:  acetaminophen  1,000 mg Oral Q6H   Or   acetaminophen (TYLENOL) oral liquid 160 mg/5 mL  1,000 mg Per Tube Q6H   bisacodyl  10 mg Oral Daily   Or   bisacodyl  10 mg Rectal Daily   Chlorhexidine Gluconate Cloth  6 each Topical Daily   docusate  200 mg Per Tube Daily   enoxaparin (LOVENOX) injection  40 mg Subcutaneous QHS   insulin aspart  0-24 Units Subcutaneous Q4H   insulin detemir  10 Units Subcutaneous Daily   levothyroxine  112 mcg Per Tube q AM   metoprolol tartrate  12.5 mg Oral BID   Or   metoprolol tartrate  12.5 mg Per Tube BID    omega-3 acid ethyl esters  1 g Oral Daily   pantoprazole  40 mg Oral Daily   rosuvastatin  20 mg Per Tube Daily   sodium chloride flush  3 mL Intravenous Q12H   Continuous Infusions:  sodium chloride     sodium chloride     sodium chloride 0 mL/hr at 03/19/22 2343   sodium chloride Stopped (03/20/22 0102)    ceFAZolin (ANCEF) IV 2 g (03/21/22 0559)   dexmedetomidine (PRECEDEX) IV infusion Stopped (03/20/22 1325)   insulin Stopped (03/20/22 1241)   lactated ringers     lactated ringers 20 mL/hr at 03/21/22 0500   lactated ringers Stopped (03/21/22 0111)   nitroGLYCERIN Stopped (03/20/22 0648)   phenylephrine (NEO-SYNEPHRINE) Adult infusion 10 mcg/min (03/21/22 0500)   PRN Meds:.sodium chloride, acetaminophen, dextrose, lactated ringers, metoprolol tartrate, midazolam, morphine injection, ondansetron (ZOFRAN) IV, mouth rinse, oxyCODONE, sodium chloride flush  General appearance: cooperative and no distress Neurologic: intact Heart: RRR Lungs: Slightly diminished at bases Abdomen: Soft, non tender, sporadic bowel sounds Extremities: SCDs in place Wound: Sternal wound dressing dry and intact. RLE wound is clean and dry  Lab Results: CBC: Recent Labs    03/20/22 1819 03/21/22 0424  WBC 13.4* 12.4*  HGB 10.7* 9.5*  HCT 31.6* 29.4*  PLT 120* 110*   BMET:  Recent Labs    03/20/22 1819 03/21/22 0424  NA 140 139  K 3.9 4.2  CL 110 107  CO2 23 24  GLUCOSE 126* 119*  BUN 14 17  CREATININE 0.81 0.90  CALCIUM 8.4* 8.3*    CMET: Lab Results  Component Value Date   WBC 12.4 (H) 03/21/2022   HGB 9.5 (L) 03/21/2022   HCT 29.4 (L) 03/21/2022   PLT 110 (L) 03/21/2022   GLUCOSE 119 (H) 03/21/2022   CHOL 238 (H) 03/19/2022   TRIG 42 03/19/2022   HDL 80 03/19/2022   LDLCALC 150 (H) 03/19/2022   ALT 10 02/01/2020   AST 16 02/01/2020   NA 139 03/21/2022   K 4.2 03/21/2022   CL 107 03/21/2022   CREATININE 0.90 03/21/2022   BUN 17 03/21/2022   CO2 24 03/21/2022   TSH  2.00 02/01/2020   INR 1.5 (H) 03/19/2022   HGBA1C 5.7 (H) 03/19/2022      PT/INR:  Recent Labs    03/19/22 2201  LABPROT 17.6*  INR 1.5*   Radiology: No results found.   Assessment/Plan: S/P Procedure(s) (LRB): CORONARY ARTERY BYPASS GRAFTING (CABG) X TWO, USING LEFT INTERNAL MAMMARY ARTERY AND RIGHT LEG GREATER SAPHENOUS VEIN HARVESTED ENDOSCOPICALLY (N/A) TRANSESOPHAGEAL ECHOCARDIOGRAM (N/A)  CV-S/p NSTEMI. SR with labile BP-? Albumin. On Lopressor 12.5 mg bid-parameters in place. Will likely start Plavix later in hospital course for ACS Pulmonary-Chest tubes with 1070. On 2-3 liters of oxygen via [place]. Wean as able over next few days. CXR this am appears to show small bilateral pleural effusions and atelectasis. Encourage incentive spirometer Volume overload-unable to give Lasix secondary to labile BP this am Expected post op blood loss anemia-H and H this am decreased to 9.5 and 29.4 5. CBGs 115/133/117. Pre op HGA1C 5.7. She likely has pre diabetes. Will stop accu checks and SS PRN upon transfer 6. Thrombocytopenia-platelets this am 110,000. Platelets prior to surgery were 97,000. 7. History of hypothyroidism-continue Levothyroxine 112 mcg daily 8. Likely remove foley and sleeve  Donielle Liston Alba PA-C 03/21/2022 7:18 AM Patient seen and examined, agree with above Alert with no motor deficit, mild confusion Dc A line, Foley, central line and pacemaker leads Mobilize- will assess whether she needs PT  Remo Lipps C. Roxan Hockey, MD [facility] [phone]

## 2022-03-22 ENCOUNTER — Inpatient Hospital Stay (HOSPITAL_COMMUNITY): Payer: Medicare PPO

## 2022-03-22 DIAGNOSIS — I214 Non-ST elevation (NSTEMI) myocardial infarction: Secondary | ICD-10-CM | POA: Diagnosis not present

## 2022-03-22 DIAGNOSIS — Z951 Presence of aortocoronary bypass graft: Secondary | ICD-10-CM | POA: Diagnosis not present

## 2022-03-22 LAB — CBC
HCT: 27.3 % — ABNORMAL LOW (ref 36.0–46.0)
Hemoglobin: 9 g/dL — ABNORMAL LOW (ref 12.0–15.0)
MCH: 29.3 pg (ref 26.0–34.0)
MCHC: 33 g/dL (ref 30.0–36.0)
MCV: 88.9 fL (ref 80.0–100.0)
Platelets: 116 10*3/uL — ABNORMAL LOW (ref 150–400)
RBC: 3.07 MIL/uL — ABNORMAL LOW (ref 3.87–5.11)
RDW: 16.6 % — ABNORMAL HIGH (ref 11.5–15.5)
WBC: 12.3 10*3/uL — ABNORMAL HIGH (ref 4.0–10.5)
nRBC: 0 % (ref 0.0–0.2)

## 2022-03-22 LAB — BASIC METABOLIC PANEL
Anion gap: 7 (ref 5–15)
BUN: 24 mg/dL — ABNORMAL HIGH (ref 8–23)
CO2: 26 mmol/L (ref 22–32)
Calcium: 8.2 mg/dL — ABNORMAL LOW (ref 8.9–10.3)
Chloride: 104 mmol/L (ref 98–111)
Creatinine, Ser: 0.99 mg/dL (ref 0.44–1.00)
GFR, Estimated: 55 mL/min — ABNORMAL LOW (ref >60)
Glucose, Bld: 102 mg/dL — ABNORMAL HIGH (ref 70–99)
Potassium: 4 mmol/L (ref 3.5–5.1)
Sodium: 137 mmol/L (ref 135–145)

## 2022-03-22 LAB — GLUCOSE, CAPILLARY
Glucose-Capillary: 92 mg/dL (ref 70–99)
Glucose-Capillary: 114 mg/dL — ABNORMAL HIGH (ref 70–99)
Glucose-Capillary: 136 mg/dL — ABNORMAL HIGH (ref 70–99)

## 2022-03-22 MED ORDER — TRAMADOL HCL 50 MG PO TABS
50.0000 mg | ORAL_TABLET | Freq: Four times a day (QID) | ORAL | Status: DC | PRN
  Administered 2022-03-27 – 2022-04-02 (×6): 50 mg via ORAL
  Filled 2022-03-22 (×6): qty 1

## 2022-03-22 MED ORDER — SODIUM CHLORIDE 0.9 % IV SOLN: 250.0000 mL | INTRAVENOUS | Status: DC | PRN

## 2022-03-22 MED ORDER — ~~LOC~~ CARDIAC SURGERY, PATIENT & FAMILY EDUCATION: Freq: Once | Status: AC

## 2022-03-22 MED ORDER — SODIUM CHLORIDE 0.9% FLUSH: 3.0000 mL | INTRAVENOUS | Status: DC | PRN

## 2022-03-22 MED ORDER — SODIUM CHLORIDE 0.9 % IV SOLN: 250.0000 mL | INTRAVENOUS | Status: DC

## 2022-03-22 MED ORDER — METOCLOPRAMIDE HCL 5 MG/ML IJ SOLN
10.0000 mg | Freq: Four times a day (QID) | INTRAMUSCULAR | Status: DC
  Administered 2022-03-22 – 2022-03-23 (×5): 10 mg via INTRAVENOUS
  Filled 2022-03-22 (×5): qty 2

## 2022-03-22 MED ORDER — ALUM & MAG HYDROXIDE-SIMETH 200-200-20 MG/5ML PO SUSP: 15.0000 mL | Freq: Four times a day (QID) | ORAL | Status: DC | PRN

## 2022-03-22 MED ORDER — MAGNESIUM HYDROXIDE 400 MG/5ML PO SUSP
30.0000 mL | Freq: Every day | ORAL | Status: DC | PRN
  Administered 2022-04-02: 30 mL via ORAL
  Filled 2022-03-22 (×3): qty 30

## 2022-03-22 MED ORDER — CEPHALEXIN 500 MG PO CAPS
500.0000 mg | ORAL_CAPSULE | Freq: Three times a day (TID) | ORAL | Status: AC
  Administered 2022-03-22 – 2022-03-26 (×14): 500 mg via ORAL
  Filled 2022-03-22 (×15): qty 1

## 2022-03-22 MED ORDER — NOREPINEPHRINE 4 MG/250ML-% IV SOLN: 2.0000 ug/min | INTRAVENOUS | Status: DC

## 2022-03-22 MED ORDER — SODIUM CHLORIDE 0.9% FLUSH
3.0000 mL | Freq: Two times a day (BID) | INTRAVENOUS | Status: DC
  Administered 2022-03-22: 3 mL via INTRAVENOUS

## 2022-03-22 MED ORDER — SODIUM CHLORIDE 0.9% FLUSH
3.0000 mL | Freq: Two times a day (BID) | INTRAVENOUS | Status: DC
  Administered 2022-03-22 – 2022-03-26 (×8): 3 mL via INTRAVENOUS

## 2022-03-22 MED ORDER — MIDODRINE HCL 5 MG PO TABS
5.0000 mg | ORAL_TABLET | Freq: Three times a day (TID) | ORAL | Status: DC
  Administered 2022-03-22 – 2022-03-23 (×4): 5 mg via ORAL
  Filled 2022-03-22 (×5): qty 1

## 2022-03-22 MED ORDER — FUROSEMIDE 20 MG PO TABS
20.0000 mg | ORAL_TABLET | Freq: Two times a day (BID) | ORAL | Status: DC
  Administered 2022-03-22 – 2022-03-23 (×3): 20 mg via ORAL
  Filled 2022-03-22 (×3): qty 1

## 2022-03-22 MED ORDER — MIDODRINE HCL 5 MG PO TABS: 10.0000 mg | ORAL_TABLET | Freq: Three times a day (TID) | ORAL | Status: DC

## 2022-03-22 NOTE — Progress Notes (Deleted)
eLink Physician-Brief Progress Note Patient Name: Alice [name] DOB: [birth year] MRN: [id number]   Date of Service  03/22/2022  HPI/Events of Note  BP soft,MAP 54.  eICU Interventions  Peripheral Norepinephrine gtt ordered.        Kerie Badger U Tymira Horkey 03/22/2022, 1:12 AM

## 2022-03-22 NOTE — Progress Notes (Addendum)
T[facility] DAILY ICU PROGRESS NOTE                   [address]            [place] [address]          [phone]   3 Days Post-Op Procedure(s) (LRB): CORONARY ARTERY BYPASS GRAFTING (CABG) X TWO, USING LEFT INTERNAL MAMMARY ARTERY AND RIGHT LEG GREATER SAPHENOUS VEIN HARVESTED ENDOSCOPICALLY (N/A) TRANSESOPHAGEAL ECHOCARDIOGRAM (N/A)  Total Length of Stay:  LOS: 3 days   Subjective: Patient asking for coffee;on her tray so assisted taking off lid. She is not sure she walked yesterday  Objective: Vital signs in last 24 hours: Temp:  [97.7 F (36.5 C)-99.4 F (37.4 C)] 97.8 F (36.6 C) (01/25 0339) Pulse Rate:  [69-152] 80 (01/25 0700) Cardiac Rhythm: Normal sinus rhythm (01/24 2000) Resp:  [12-32] 14 (01/25 0700) BP: (68-120)/(40-70) 101/48 (01/25 0700) SpO2:  [90 %-100 %] 99 % (01/25 0700) Weight:  [66.9 kg] 66.9 kg (01/25 0500)  Filed Weights   03/19/22 0616 03/20/22 0500 03/22/22 0500  Weight: 54.4 kg 68.2 kg 66.9 kg      Intake/Output from previous day: 01/24 0701 - 01/25 0700 In: 163.2 [I.V.:63; IV Piggyback:100.1] Out: 555 [Urine:555]  Intake/Output this shift: No intake/output data recorded.  Current Meds: Scheduled Meds:  acetaminophen  1,000 mg Oral Q6H   Or   acetaminophen (TYLENOL) oral liquid 160 mg/5 mL  1,000 mg Per Tube Q6H   aspirin  324 mg Oral Daily   bisacodyl  10 mg Oral Daily   Or   bisacodyl  10 mg Rectal Daily   Chlorhexidine Gluconate Cloth  6 each Topical Daily   docusate sodium  200 mg Oral Daily   enoxaparin (LOVENOX) injection  40 mg Subcutaneous QHS   insulin aspart  0-9 Units Subcutaneous TID WC   levothyroxine  112 mcg Per Tube q AM   metoprolol tartrate  12.5 mg Oral BID   Or   metoprolol tartrate  12.5 mg Per Tube BID   omega-3 acid ethyl esters  1 g Oral Daily   pantoprazole  40 mg Oral Daily   rosuvastatin  20 mg Oral Daily   sodium chloride flush  3 mL Intravenous Q12H   Continuous Infusions:  sodium  chloride     sodium chloride     sodium chloride 10 mL/hr at 03/21/22 1800   sodium chloride     sodium chloride     sodium chloride Stopped (03/20/22 0102)   lactated ringers     lactated ringers Stopped (03/21/22 0853)   lactated ringers Stopped (03/21/22 0641)   PRN Meds:.sodium chloride, acetaminophen, lactated ringers, metoprolol tartrate, midazolam, ondansetron (ZOFRAN) IV, mouth rinse, oxyCODONE, sodium chloride flush  General appearance: cooperative and no distress Neurologic: intact Heart: RRR Lungs: Slightly diminished at bases L>R Abdomen: Soft, non tender, sporadic bowel sounds Extremities: SCDs in place Wound: Sternal wound dressing removed and sternal wound is clean and dry. RLE wound is clean and dry  Lab Results: CBC: Recent Labs    03/20/22 1819 03/21/22 0424  WBC 13.4* 12.4*  HGB 10.7* 9.5*  HCT 31.6* 29.4*  PLT 120* 110*    BMET:  Recent Labs    03/20/22 1819 03/21/22 0424  NA 140 139  K 3.9 4.2  CL 110 107  CO2 23 24  GLUCOSE 126* 119*  BUN 14 17  CREATININE 0.81 0.90  CALCIUM 8.4* 8.3*  CMET: Lab Results  Component Value Date   WBC 12.4 (H) 03/21/2022   HGB 9.5 (L) 03/21/2022   HCT 29.4 (L) 03/21/2022   PLT 110 (L) 03/21/2022   GLUCOSE 119 (H) 03/21/2022   CHOL 238 (H) 03/19/2022   TRIG 42 03/19/2022   HDL 80 03/19/2022   LDLCALC 150 (H) 03/19/2022   ALT 10 02/01/2020   AST 16 02/01/2020   NA 139 03/21/2022   K 4.2 03/21/2022   CL 107 03/21/2022   CREATININE 0.90 03/21/2022   BUN 17 03/21/2022   CO2 24 03/21/2022   TSH 2.00 02/01/2020   INR 1.5 (H) 03/19/2022   HGBA1C 5.7 (H) 03/19/2022      PT/INR:  Recent Labs    03/19/22 2201  LABPROT 17.6*  INR 1.5*    Radiology: No results found.   Assessment/Plan: S/P Procedure(s) (LRB): CORONARY ARTERY BYPASS GRAFTING (CABG) X TWO, USING LEFT INTERNAL MAMMARY ARTERY AND RIGHT LEG GREATER SAPHENOUS VEIN HARVESTED ENDOSCOPICALLY (N/A) TRANSESOPHAGEAL ECHOCARDIOGRAM  (N/A)  CV-S/p NSTEMI. SR with labile BP. On Lopressor 12.5 mg bid with parameters in place and not being given much. As discussed with Dr. Roxan Hockey, will start Midodrine. Will likely start Plavix later in hospital course for ACS Pulmonary-On 2 liters of oxygen via [place]. Wean as able over next few days. CXR this am appears stable (shows small bilateral pleural effusions and atelectasis) L>R. Encourage incentive spirometer Volume overload-would like to give low dose Lasix but will discuss with surgeon Expected post op blood loss anemia-H and H this am slightly decreased to 9 and 27.3 5. CBGs 148/123/92. Pre op HGA1C 5.7. She likely has pre diabetes. Will stop accu checks and SS PRN upon transfer 6. Thrombocytopenia-Platelets this am 116,000. Platelets prior to surgery were 97,000.  On Lovenox and ec asa 7. History of hypothyroidism-continue Levothyroxine 112 mcg daily 8. Mild confusion-no gross neuro deficit 9. GI-advance diet as tolerates. Zofran, Reglan for intermittent nausea 10. Hope to transfer to 4E  Donielle Liston Alba PA-C 03/22/2022 7:07 AM  Patient seen and examined, agree with above She has no motor deficits. Alert and oriented, but has short term memory problems that I suspect predated surgery Plan as outlined above Her left arm remains mildly swollen, now with some erythema as well- dc antecubital IV and start warm compresses and Keflex PO (cefdinir allergy= vertigo and HA) Transfer to 4E Continue Cardiac rehab  Remo Lipps C. Roxan Hockey, MD [facility] [phone]

## 2022-03-22 NOTE — Progress Notes (Signed)
Patient family desires to be involved with D/C plans when it comes time. Family states she has short term dementia and does not answer questions correctly. Family feels that SNF maybe appropriate at D/C. Family states she is non compliant with medication at home. Would appreciate Social work/Case manger input in advance.

## 2022-03-22 NOTE — Progress Notes (Deleted)
[name] DOB: April 30, 1930 MRN: [id number]   Date of Service  03/22/2022  HPI/Events of Note  Blood pressure remains soft although  MAP is  reasonable.  eICU Interventions  Will order peripheral  Norepinephrine  gtt.        Kerry Kass Cruz Devilla 03/22/2022, 12:59 AM

## 2022-03-22 NOTE — Progress Notes (Signed)
Pt arrived from ..2H..., A/ox .Marland Kitchen2.pt denies any pain, MD aware,CCMD called. CHG bath given,no further needs at this time

## 2022-03-23 ENCOUNTER — Inpatient Hospital Stay (HOSPITAL_COMMUNITY): Payer: Medicare PPO

## 2022-03-23 DIAGNOSIS — Z951 Presence of aortocoronary bypass graft: Secondary | ICD-10-CM | POA: Diagnosis not present

## 2022-03-23 DIAGNOSIS — I214 Non-ST elevation (NSTEMI) myocardial infarction: Secondary | ICD-10-CM | POA: Diagnosis not present

## 2022-03-23 LAB — BPAM RBC
Blood Product Expiration Date: 202402022359
Blood Product Expiration Date: 202402062359
Blood Product Expiration Date: 202402072359
Blood Product Expiration Date: 202402072359
Blood Product Expiration Date: 202402152359
Blood Product Expiration Date: 202402162359
ISSUE DATE / TIME: 202401221817
ISSUE DATE / TIME: 202401221817
ISSUE DATE / TIME: 202401221817
ISSUE DATE / TIME: 202401221817
Unit Type and Rh: 600
Unit Type and Rh: 600
Unit Type and Rh: 600
Unit Type and Rh: 600
Unit Type and Rh: 600
Unit Type and Rh: 600

## 2022-03-23 LAB — TYPE AND SCREEN
ABO/RH(D): A NEG
Antibody Screen: NEGATIVE
Unit division: 0
Unit division: 0
Unit division: 0
Unit division: 0
Unit division: 0
Unit division: 0

## 2022-03-23 LAB — CBC
HCT: 26.2 % — ABNORMAL LOW (ref 36.0–46.0)
Hemoglobin: 8.2 g/dL — ABNORMAL LOW (ref 12.0–15.0)
MCH: 28.3 pg (ref 26.0–34.0)
MCHC: 31.3 g/dL (ref 30.0–36.0)
MCV: 90.3 fL (ref 80.0–100.0)
Platelets: 132 10*3/uL — ABNORMAL LOW (ref 150–400)
RBC: 2.9 MIL/uL — ABNORMAL LOW (ref 3.87–5.11)
RDW: 16.1 % — ABNORMAL HIGH (ref 11.5–15.5)
WBC: 9.1 10*3/uL (ref 4.0–10.5)
nRBC: 0.2 % (ref 0.0–0.2)

## 2022-03-23 LAB — BASIC METABOLIC PANEL
Anion gap: 7 (ref 5–15)
BUN: 25 mg/dL — ABNORMAL HIGH (ref 8–23)
CO2: 27 mmol/L (ref 22–32)
Calcium: 8.1 mg/dL — ABNORMAL LOW (ref 8.9–10.3)
Chloride: 102 mmol/L (ref 98–111)
Creatinine, Ser: 1.01 mg/dL — ABNORMAL HIGH (ref 0.44–1.00)
GFR, Estimated: 53 mL/min — ABNORMAL LOW (ref >60)
Glucose, Bld: 121 mg/dL — ABNORMAL HIGH (ref 70–99)
Potassium: 3.5 mmol/L (ref 3.5–5.1)
Sodium: 136 mmol/L (ref 135–145)

## 2022-03-23 MED ORDER — POTASSIUM CHLORIDE CRYS ER 20 MEQ PO TBCR
40.0000 meq | EXTENDED_RELEASE_TABLET | Freq: Two times a day (BID) | ORAL | Status: DC
  Filled 2022-03-23: qty 2

## 2022-03-23 MED ORDER — POTASSIUM CHLORIDE CRYS ER 20 MEQ PO TBCR
20.0000 meq | EXTENDED_RELEASE_TABLET | Freq: Every day | ORAL | Status: DC
  Administered 2022-03-24 – 2022-03-25 (×2): 20 meq via ORAL
  Filled 2022-03-23: qty 2
  Filled 2022-03-23 (×2): qty 1

## 2022-03-23 MED ORDER — ENOXAPARIN SODIUM 30 MG/0.3ML IJ SOSY
30.0000 mg | PREFILLED_SYRINGE | Freq: Every day | INTRAMUSCULAR | Status: DC
  Administered 2022-03-23 – 2022-04-02 (×11): 30 mg via SUBCUTANEOUS
  Filled 2022-03-23 (×11): qty 0.3

## 2022-03-23 MED ORDER — POTASSIUM CHLORIDE CRYS ER 20 MEQ PO TBCR
40.0000 meq | EXTENDED_RELEASE_TABLET | Freq: Two times a day (BID) | ORAL | Status: AC
  Administered 2022-03-23 (×2): 40 meq via ORAL
  Filled 2022-03-23: qty 2

## 2022-03-23 MED ORDER — FUROSEMIDE 40 MG PO TABS
40.0000 mg | ORAL_TABLET | Freq: Every day | ORAL | Status: DC
  Administered 2022-03-24: 40 mg via ORAL
  Filled 2022-03-23 (×2): qty 1

## 2022-03-23 MED ORDER — METOCLOPRAMIDE HCL 5 MG/ML IJ SOLN: 10.0000 mg | Freq: Four times a day (QID) | INTRAMUSCULAR | Status: AC

## 2022-03-23 NOTE — Progress Notes (Signed)
Son has requested to speak to someone in Case Management again.  He states that SNF may not be the option they are looking for.  He relayed that she may need "rehab."   He requests that someone come and explain options.

## 2022-03-23 NOTE — Progress Notes (Signed)
Patient confused diaphoric skin warm. No complaints voiced. V.S.S. Check CBg 136.

## 2022-03-23 NOTE — Progress Notes (Addendum)
[address]       [place] [address]             [phone]        4 Days Post-Op Procedure(s) (LRB): CORONARY ARTERY BYPASS GRAFTING (CABG) X TWO, USING LEFT INTERNAL MAMMARY ARTERY AND RIGHT LEG GREATER SAPHENOUS VEIN HARVESTED ENDOSCOPICALLY (N/A) TRANSESOPHAGEAL ECHOCARDIOGRAM (N/A)  Subjective: Patient just waking up this am. Per nurse, large bowel movement yesterday. Patient denies further nausea  Objective: Vital signs in last 24 hours: Temp:  [97.4 F (36.3 C)-98.4 F (36.9 C)] 98.2 F (36.8 C) (01/26 0506) Pulse Rate:  [44-98] 89 (01/26 0506) Cardiac Rhythm: Sinus tachycardia (01/25 1950) Resp:  [17-35] 22 (01/26 0506) BP: (105-141)/(51-109) 137/70 (01/26 0506) SpO2:  [69 %-100 %] 99 % (01/26 0506) FiO2 (%):  [40 %] 40 % (01/25 0905) Weight:  [65.4 kg] 65.4 kg (01/26 0430)  Pre op weight  54.4 kg Current Weight  03/23/22 65.4 kg      Intake/Output from previous day: 01/25 0701 - 01/26 0700 In: 360.7 [P.O.:360; I.V.:0.7] Out: 915 [Urine:915]   Physical Exam:  Cardiovascular: RRR Pulmonary: Slightly diminished at bases Abdomen: Soft, non tender, bowel sounds present. Extremities: Mild bilateral lower extremity edema. Left forearm with erythema, warmth. Wounds: Clean and dry.  No erythema or signs of infection.  Lab Results: CBC: Recent Labs    03/22/22 0558 03/23/22 0221  WBC 12.3* 9.1  HGB 9.0* 8.2*  HCT 27.3* 26.2*  PLT 116* 132*   BMET:  Recent Labs    03/22/22 0558 03/23/22 0221  NA 137 136  K 4.0 3.5  CL 104 102  CO2 26 27  GLUCOSE 102* 121*  BUN 24* 25*  CREATININE 0.99 1.01*  CALCIUM 8.2* 8.1*    PT/INR:  Lab Results  Component Value Date   INR 1.5 (H) 03/19/2022   INR 0.94 09/10/2014   ABG:  INR: Will add last result for INR, ABG once components are confirmed Will add last 4 CBG results once components are confirmed  Assessment/Plan:  CV-S/p NSTEMI. SR, first degree heart block. On  Lopressor 12.5 mg bid with parameters in place and Midodrine. Will likely start Plavix later in hospital course for ACS. Hope to titrate BB as BP improves Pulmonary-On 2 liters of oxygen via [place]. Wean as able over next few days. CXR this am appears stable (shows small bilateral pleural effusions and atelectasis) L>R. Encourage incentive spirometer and flutter valve Volume overload-on Lasix 20 mg orally bid Expected post op blood loss anemia-H and H this am slightly decreased to 8.2 and 26.2 6. Thrombocytopenia-Platelets increased to 132,000. Platelets prior to surgery were 97,000.  On Lovenox and ec asa 7. History of hypothyroidism-continue Levothyroxine 112 mcg daily 8. Mild confusion-no gross neuro deficit 9. Supplement potassium 10. Phlebitis-on Keflex, warm compresses 11. Will need SNF at discharge as lives alone  Donielle M ZimmermanPA-C 7:00 AM Patient seen and examined, agree with above Short term memory deficit- present prior to surgery, mild confusion- minimize psychoactive meds Continue cardiac rehab Will need SNF placement CXR shows mild edema, effusions- change Lasix to 40 mg daily  Remo Lipps C. Roxan Hockey, MD [facility] [phone]

## 2022-03-23 NOTE — Care Management Important Message (Signed)
Important Message  Patient Details  Name: Alice [name] MRN: [id number] Date of Birth: 12/15/1930   Medicare Important Message Given:  Yes     Shelda Altes 03/23/2022, 9:00 AM

## 2022-03-23 NOTE — Progress Notes (Addendum)
Chest tube dressing site, saturated, dressing changed. Bed side RN aware Call bell within reach. Pearlee Arvizu, Bettina Gavia rN

## 2022-03-23 NOTE — Progress Notes (Signed)
Rounding Note    Patient Name: Alice [name] Date of Encounter: 03/23/2022  [place] Cardiologist: Freada Bergeron, MD   Subjective   Transferred out of the ICU.  Confused this morning and unsure of where she is. Denies any significant chest pain or SOB.  Inpatient Medications    Scheduled Meds:  acetaminophen  1,000 mg Oral Q6H   Or   acetaminophen (TYLENOL) oral liquid 160 mg/5 mL  1,000 mg Per Tube Q6H   aspirin  324 mg Oral Daily   cephALEXin  500 mg Oral Q8H   Chlorhexidine Gluconate Cloth  6 each Topical Daily   enoxaparin (LOVENOX) injection  30 mg Subcutaneous QHS   [START ON 03/24/2022] furosemide  40 mg Oral Daily   levothyroxine  112 mcg Per Tube q AM   metoCLOPramide (REGLAN) injection  10 mg Intravenous Q6H   metoprolol tartrate  12.5 mg Oral BID   Or   metoprolol tartrate  12.5 mg Per Tube BID   midodrine  5 mg Oral TID WC   omega-3 acid ethyl esters  1 g Oral Daily   pantoprazole  40 mg Oral Daily   [START ON 03/24/2022] potassium chloride  20 mEq Oral Daily   potassium chloride  40 mEq Oral BID   rosuvastatin  20 mg Oral Daily   sodium chloride flush  3 mL Intravenous Q12H   Continuous Infusions:  sodium chloride     PRN Meds: sodium chloride, alum & mag hydroxide-simeth, magnesium hydroxide, metoprolol tartrate, ondansetron (ZOFRAN) IV, mouth rinse, sodium chloride flush, traMADol   Vital Signs    Vitals:   03/23/22 0430 03/23/22 0506 03/23/22 0816 03/23/22 0820  BP:  137/70 (!) 142/72 (!) 142/72  Pulse: (!) 44 89 94 87  Resp: (!) 22 (!) '22 20 15  '$ Temp:  98.2 F (36.8 C)  97.7 F (36.5 C)  TempSrc:  Oral  Oral  SpO2: (!) 69% 99% 90% 99%  Weight: 65.4 kg     Height:        Intake/Output Summary (Last 24 hours) at 03/23/2022 0845 Last data filed at 03/23/2022 4268 Gross per 24 hour  Intake 360 ml  Output 1205 ml  Net -845 ml       03/23/2022    4:30 AM 03/22/2022    5:00 AM 03/20/2022    5:00 AM  Last 3 Weights   Weight (lbs) 144 lb 1.6 oz 147 lb 8 oz 150 lb 5.7 oz  Weight (kg) 65.363 kg 66.906 kg 68.2 kg      Telemetry    NSR/ST - Personally Reviewed  ECG    No new tracing today - Personally Reviewed  Physical Exam   GEN: Laying in bed, comfortable Neck: No JVD Cardiac: RRR, 2/6 systolic murmur Respiratory: Diminished at the bases GI: Soft, nontender, non-distended  MS: LUE mildly swollen  Neuro:  Confused this morning and does not know where she is. Otherwise nonfocal Psych: Confused but calm  Labs    High Sensitivity Troponin:   Recent Labs  Lab 03/19/22 0628 03/19/22 0815  TROPONINIHS 116* 126*      Chemistry Recent Labs  Lab 03/20/22 0335 03/20/22 0652 03/20/22 1819 03/21/22 0424 03/22/22 0558 03/23/22 0221  NA 141   < > 140 139 137 136  K 3.8   < > 3.9 4.2 4.0 3.5  CL 110  --  110 107 104 102  CO2 25  --  '23 24 26 '$ 27  GLUCOSE 97  --  126* 119* 102* 121*  BUN 15  --  14 17 24* 25*  CREATININE 0.78  --  0.81 0.90 0.99 1.01*  CALCIUM 8.0*  --  8.4* 8.3* 8.2* 8.1*  MG 3.1*  --  2.3  --   --   --   GFRNONAA >60  --  >60 >60 55* 53*  ANIONGAP 6  --  '7 8 7 7   '$ < > = values in this interval not displayed.     Lipids  Recent Labs  Lab 03/19/22 0815  CHOL 238*  TRIG 42  HDL 80  LDLCALC 150*  CHOLHDL 3.0     Hematology Recent Labs  Lab 03/21/22 0424 03/22/22 0558 03/23/22 0221  WBC 12.4* 12.3* 9.1  RBC 3.29* 3.07* 2.90*  HGB 9.5* 9.0* 8.2*  HCT 29.4* 27.3* 26.2*  MCV 89.4 88.9 90.3  MCH 28.9 29.3 28.3  MCHC 32.3 33.0 31.3  RDW 16.6* 16.6* 16.1*  PLT 110* 116* 132*    Thyroid No results for input(s): "TSH", "FREET4" in the last 168 hours.  BNPNo results for input(s): "BNP", "PROBNP" in the last 168 hours.  DDimer No results for input(s): "DDIMER" in the last 168 hours.   Radiology    DG Chest 2 View  Result Date: 03/23/2022 CLINICAL DATA:  Status post CABG. EXAM: CHEST - 2 VIEW COMPARISON:  03/22/2022. FINDINGS: 0444 hours. Low lung  volumes accentuate the pulmonary vasculature and cardiomediastinal silhouette. Slightly increased pulmonary venous congestion. Unchanged small left and trace right pleural effusions. No pneumothorax. Stable cardiac and mediastinal contours status post median sternotomy and CABG. Aortic atherosclerosis. IMPRESSION: Slightly increased pulmonary venous congestion with unchanged small left and trace right pleural effusions. Aortic Atherosclerosis (ICD10-I70.0). Electronically Signed   By: Emmit Alexanders M.D.   On: 03/23/2022 07:59   DG Chest 2 View  Result Date: 03/22/2022 CLINICAL DATA:  Status post coronary artery bypass graft. EXAM: CHEST - 2 VIEW COMPARISON:  March 21, 2022. FINDINGS: Stable cardiomediastinal silhouette. Bibasilar subsegmental atelectasis is noted with probable small bilateral pleural effusions. Bony thorax is unremarkable. IMPRESSION: Stable bibasilar subsegmental atelectasis with probable small bilateral pleural effusions. Electronically Signed   By: Marijo Conception M.D.   On: 03/22/2022 08:19    Cardiac Studies   LHC 03/19/22:   Mid LM to Dist LM lesion is 99% stenosed.   Prox RCA lesion is 90% stenosed.   Mid LAD lesion is 60% stenosed.   Intervention   TTE 03/19/22: IMPRESSIONS     1. Left ventricular ejection fraction, by estimation, is 60 to 65%. The  left ventricle has normal function. The left ventricle has no regional  wall motion abnormalities. Left ventricular diastolic parameters are  consistent with Grade I diastolic  dysfunction (impaired relaxation).   2. Right ventricular systolic function is normal. The right ventricular  size is normal. There is mildly elevated pulmonary artery systolic  pressure.   3. Left atrial size was mildly dilated.   4. Large calcium deposit on PMVL.Marland Kitchen The mitral valve is degenerative. Mild  mitral valve regurgitation. No evidence of mitral stenosis. Moderate  mitral annular calcification.   5. The aortic valve is normal in  structure. Aortic valve regurgitation is  not visualized. Aortic valve sclerosis is present, with no evidence of  aortic valve stenosis. Aortic valve area, by VTI measures 1.44 cm. Aortic  valve mean gradient measures 8.0  mmHg. Aortic valve Vmax measures 1.81 m/s.   6. The inferior  vena cava is dilated in size with >50% respiratory  variability, suggesting right atrial pressure of 8 mmHg.   Patient Profile     89 y.o. female with hx of HLD, prior tobacco abuse, hypothyroidism and history of breast cancer who presented with a NSTEMI found to have critical LM disease on cath prompting urgent CABG with LIMA-LAD and SVG-OM on 03/19/22.  Assessment & Plan    #Critical LM Disease: #NSTEMI: Patient presented with chest pain found to have trop 116>126 with subtle STD in lateral leads. TTE with LVEF 60-65%, G1DD, normal RV, mild LAE, mild MR, aortic sclerosis. LHC on 1/22 showed 99% critical LM disease prompting emergent CABG where she underwent LIMA-LAD and SVG-OM. IABP pulled and patient extubated on 1/23. -Extubated and IABP pulled 1/23 -Continue ASA '324mg'$  daily -Continue metop 12.'5mg'$  BID with holding parameters -Continue crestor '20mg'$  daily -PO lasix added today -Plan to add plavix prior to discharge given ACS -Will add GDMT as able -Post-op care per CV surgery  #LUE Swelling/Phlebitis: Patient has mild left forearm swelling with pain to palpation. A-line and PIV removed. On keflex.  #HLD: -Continue crestor '20mg'$  daily as above  For questions or updates, please contact [place] Please consult [email or web address] for contact info under        Signed, Freada Bergeron, MD  03/23/2022, 8:45 AM

## 2022-03-24 DIAGNOSIS — I214 Non-ST elevation (NSTEMI) myocardial infarction: Secondary | ICD-10-CM | POA: Diagnosis not present

## 2022-03-24 LAB — BASIC METABOLIC PANEL
Anion gap: 11 (ref 5–15)
BUN: 22 mg/dL (ref 8–23)
CO2: 27 mmol/L (ref 22–32)
Calcium: 8.2 mg/dL — ABNORMAL LOW (ref 8.9–10.3)
Chloride: 100 mmol/L (ref 98–111)
Creatinine, Ser: 0.88 mg/dL (ref 0.44–1.00)
GFR, Estimated: >60 mL/min (ref >60)
Glucose, Bld: 132 mg/dL — ABNORMAL HIGH (ref 70–99)
Potassium: 3.9 mmol/L (ref 3.5–5.1)
Sodium: 138 mmol/L (ref 135–145)

## 2022-03-24 LAB — CBC
HCT: 28.9 % — ABNORMAL LOW (ref 36.0–46.0)
Hemoglobin: 9.1 g/dL — ABNORMAL LOW (ref 12.0–15.0)
MCH: 28.4 pg (ref 26.0–34.0)
MCHC: 31.5 g/dL (ref 30.0–36.0)
MCV: 90.3 fL (ref 80.0–100.0)
Platelets: 202 10*3/uL (ref 150–400)
RBC: 3.2 MIL/uL — ABNORMAL LOW (ref 3.87–5.11)
RDW: 16.2 % — ABNORMAL HIGH (ref 11.5–15.5)
WBC: 9.8 10*3/uL (ref 4.0–10.5)
nRBC: 1 % — ABNORMAL HIGH (ref 0.0–0.2)

## 2022-03-24 NOTE — Progress Notes (Signed)
CARDIAC REHAB PHASE I   Pt feeling ok today. PT here to see pt for walk. Reviewed sternal precautions,ambulation and IS use. Will continue to follow.   [phone]  Vanessa Barbara, RN BSN 03/24/2022 8:59 AM

## 2022-03-24 NOTE — Evaluation (Addendum)
Occupational Therapy Evaluation Patient Details Name: Alice [name] MRN: [id number] DOB: 1930/10/23 Today's Date: 03/24/2022   History of Present Illness Pt is a 89 y.o. 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 Significant PMH: HLD, prior tobacco abuse, breast cancaer, hypothyroidism.   Clinical Impression   Pt reports independence at baseline with ADLs (some difficulty with med mgmt per pt's son) and functional mobility, lives alone, however pt's son (in room during evaluation) reports he plans to stay with pt at d/c if needed. Pt currently needing supervision-mod A for ADLs, mod I for bed mobility, and supervision for transfers with RW. Pt provided with sternal precautions handout, reviewed with pt and son, pt verbalized understanding, however needs min-mod cues to adhere to sternal precautions throughout session, will benefit from continued reinforcement. Pt presenting with impairments listed below, will follow acutely. Recommend HHOT at d/c if family able to provide 24/7 assist, if unable will need SNF.     Recommendations for follow up therapy are one component of a multi-disciplinary discharge planning process, led by the attending physician.  Recommendations may be updated based on patient status, additional functional criteria and insurance authorization.   Follow Up Recommendations  Home health OT (if family able to arrange 24/7 assist, if unable will need SNF)     Assistance Recommended at Discharge Frequent or constant Supervision/Assistance  Patient can return home with the following A little help with walking and/or transfers;A lot of help with bathing/dressing/bathroom;Assistance with cooking/housework;Direct supervision/assist for medications management;Direct supervision/assist for financial management;Assist for transportation;Help with stairs or ramp for entrance    Functional Status Assessment  Patient has  had a recent decline in their functional status and demonstrates the ability to make significant improvements in function in a reasonable and predictable amount of time.  Equipment Recommendations  Tub/shower bench;BSC/3in1    Recommendations for Other Services PT consult     Precautions / Restrictions Precautions Precautions: Sternal;Fall Restrictions Weight Bearing Restrictions: Yes Other Position/Activity Restrictions: sternal precautions, handout provided/reviewed      Mobility Bed Mobility Overal bed mobility: Needs Assistance Bed Mobility: Sidelying to Sit, Sit to Sidelying   Sidelying to sit: Min guard     Sit to sidelying: Min guard General bed mobility comments: adhering to sternal precautions    Transfers Overall transfer level: Needs assistance Equipment used: Rolling walker (2 wheels) Transfers: Sit to/from Stand Sit to Stand: Supervision           General transfer comment: cues for sternal precautions, hands on knees      Balance Overall balance assessment: Mild deficits observed, not formally tested                                         ADL either performed or assessed with clinical judgement   ADL Overall ADL's : Needs assistance/impaired Eating/Feeding: Supervision/ safety   Grooming: Supervision/safety   Upper Body Bathing: Minimal assistance   Lower Body Bathing: Moderate assistance   Upper Body Dressing : Moderate assistance   Lower Body Dressing: Minimal assistance;Sitting/lateral leans   Toilet Transfer: Supervision/safety;Rolling walker (2 wheels);Ambulation;Regular Museum/gallery exhibitions officer and Hygiene: Supervision/safety Toileting - Clothing Manipulation Details (indicate cue type and reason): cues for precautions     Functional mobility during ADLs: Min guard;Rolling walker (2 wheels)  Vision   Vision Assessment?: No apparent visual deficits     Perception Perception Perception  Tested?: No   Praxis Praxis Praxis tested?: Not tested    Pertinent Vitals/Pain Pain Assessment Pain Assessment: No/denies pain     Hand Dominance     Extremity/Trunk Assessment Upper Extremity Assessment Upper Extremity Assessment: Generalized weakness   Lower Extremity Assessment Lower Extremity Assessment: Defer to PT evaluation   Cervical / Trunk Assessment Cervical / Trunk Assessment: Kyphotic   Communication Communication Communication: No difficulties   Cognition Arousal/Alertness: Awake/alert Behavior During Therapy: WFL for tasks assessed/performed Overall Cognitive Status: Within Functional Limits for tasks assessed                                       General Comments  VSS on 2L O2, HR increasing to 130's while in supine for a few seconds, however returned to 80-90 after a few seconds, RN aware    Exercises     Shoulder Instructions      Home Living Family/patient expects to be discharged to:: Private residence Living Arrangements: Alone Available Help at Discharge: Family;Available PRN/intermittently Type of Home: House Home Access: Stairs to enter [employer] of Steps: 1   Home Layout: One level     Bathroom Shower/Tub: Tub/shower unit         Home Equipment: None   Additional Comments: daughter is closest, 1.5 hrs away all other family members are 5+ hours away      Prior Functioning/Environment Prior Level of Function : Independent/Modified Independent;Driving             Mobility Comments: no AD use ADLs Comments: per son, pt has been having difficulty with managing meds        OT Problem List: Decreased range of motion;Decreased strength;Decreased activity tolerance;Decreased cognition;Decreased safety awareness;Decreased knowledge of precautions;Cardiopulmonary status limiting activity      OT Treatment/Interventions: Self-care/ADL training;Therapeutic exercise;Energy conservation;DME and/or AE  instruction;Therapeutic activities;Patient/family education;Balance training    OT Goals(Current goals can be found in the care plan section) Acute Rehab OT Goals Patient Stated Goal: none stated OT Goal Formulation: With patient Time For Goal Achievement: 04/07/22 Potential to Achieve Goals: Good ADL Goals Pt Will Perform Upper Body Dressing: with supervision;sitting Pt Will Perform Lower Body Dressing: with supervision;sit to/from stand;sitting/lateral leans Pt Will Transfer to Toilet: with supervision;ambulating;regular height toilet Pt Will Perform Tub/Shower Transfer: Tub transfer;Shower transfer;ambulating;with supervision Additional ADL Goal #1: Pt will perform 2 ADL tasks with min cues to adhere to sternal precautions.  OT Frequency: Min 2X/week    Co-evaluation              AM-PAC OT "6 Clicks" Daily Activity     Outcome Measure Help from another person eating meals?: None Help from another person taking care of personal grooming?: A Little Help from another person toileting, which includes using toliet, bedpan, or urinal?: A Little Help from another person bathing (including washing, rinsing, drying)?: A Lot Help from another person to put on and taking off regular upper body clothing?: A Little Help from another person to put on and taking off regular lower body clothing?: A Lot 6 Click Score: 17   End of Session Equipment Utilized During Treatment: Gait belt;Rolling walker (2 wheels);Oxygen (2L) Nurse Communication: Mobility status  Activity Tolerance: Patient tolerated treatment well Patient left: in bed;with call bell/phone within reach;with bed alarm set;with family/visitor present  OT Visit Diagnosis: Unsteadiness on feet (R26.81);Other abnormalities of gait and mobility (R26.89);Muscle weakness (generalized) (M62.81)                Time: 9937-1696 OT Time Calculation (min): 35 min Charges:  OT General Charges $OT Visit: 1 Visit OT Evaluation $OT Eval  Moderate Complexity: 1 Mod OT Treatments $Self Care/Home Management : 8-22 mins  Renaye Rakers, OTD, OTR/L [employer] Preferred Acute Rehab [phone]  Ulla Gallo 03/24/2022, 4:36 PM

## 2022-03-24 NOTE — Progress Notes (Addendum)
[address]       [place] [address]             [phone]        5 Days Post-Op Procedure(s) (LRB): CORONARY ARTERY BYPASS GRAFTING (CABG) X TWO, USING LEFT INTERNAL MAMMARY ARTERY AND RIGHT LEG GREATER SAPHENOUS VEIN HARVESTED ENDOSCOPICALLY (N/A) TRANSESOPHAGEAL ECHOCARDIOGRAM (N/A)  Subjective: Awake and cooperative, no new concerns.   Objective: Vital signs in last 24 hours: Temp:  [97.5 F (36.4 C)-98.7 F (37.1 C)] 98.6 F (37 C) (01/27 0731) Pulse Rate:  [62-100] 92 (01/27 0731) Cardiac Rhythm: Normal sinus rhythm (01/27 0736) Resp:  [18-20] 18 (01/27 0731) BP: (128-163)/(67-80) 133/67 (01/27 0731) SpO2:  [92 %-96 %] 94 % (01/27 0731) Weight:  [65.3 kg] 65.3 kg (01/27 0500)  Pre op weight  54.4 kg Current Weight  03/24/22 65.3 kg      Intake/Output from previous day: 01/26 0701 - 01/27 0700 In: -  Out: 350 [Urine:350]   Physical Exam:  Cardiovascular: RRR, monitor showing SR with an episode of SVT Pulmonary: breath sounds clear and non-labored.  Abdomen: Soft, non tender, bowel sounds present. Extremities: trace lower extremity edema. Left forearm with erythema, warmth. Wounds: Clean and dry.  No erythema or signs of infection.  Lab Results: CBC: Recent Labs    03/22/22 0558 03/23/22 0221  WBC 12.3* 9.1  HGB 9.0* 8.2*  HCT 27.3* 26.2*  PLT 116* 132*    BMET:  Recent Labs    03/22/22 0558 03/23/22 0221  NA 137 136  K 4.0 3.5  CL 104 102  CO2 26 27  GLUCOSE 102* 121*  BUN 24* 25*  CREATININE 0.99 1.01*  CALCIUM 8.2* 8.1*     PT/INR:  Lab Results  Component Value Date   INR 1.5 (H) 03/19/2022   INR 0.94 09/10/2014   ABG:  INR: Will add last result for INR, ABG once components are confirmed Will add last 4 CBG results once components are confirmed  Assessment/Plan:  CV-S/p NSTEMI. SR, first degree heart block. On Lopressor 12.5 mg bid with parameters in place. Will stop the midodrine.  Plan to  start Plavix at discharge for ACS. Pulmonary-On RA with stable sats.  Encourage incentive spirometer and flutter valve for ATX Volume overload-mild, on Lasix 20 mg orally bid Expected post op blood loss anemia-follow up lab tomorrow. 6. Thrombocytopenia-resolved, On Lovenox and ec asa 7. History of hypothyroidism-continue Levothyroxine 112 mcg daily 8. Mild confusion-no gross neuro deficit 9. Phlebitis-left forearm improving. Continue Keflex, warm compresses 10. Will need SNF at discharge as lives alone, CM team aware and PT / OT consults requested.   Antony Odea, PA-C 9:01 AM   Agree with above Dispo planning  Lajuana Matte

## 2022-03-24 NOTE — Evaluation (Addendum)
Physical Therapy Evaluation Patient Details Name: Alice [name] MRN: [id number] DOB: 1930/12/07 Today's Date: 03/24/2022  History of Present Illness  Pt is a 89 y.o. 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 Significant PMH: HLD, prior tobacco abuse, breast cancaer, hypothyroidism.  Clinical Impression  PTA, pt lives alone and is independent. Pt presents with decreased cardiopulmonary endurance and mild balance deficits. Pt is overall mobilizing well. Ambulating 150 ft x 2 with a Rollator at a supervision level; able to self monitor for activity tolerance and take rest break when needed. HR 94-103 bpm. Reminders for sternal precautions provided. Pt verbalizing good pain control. Pt reports possibility that one of her 3 sons can stay with her at home initially. Attempted to call son, Alice [name], with no response. Will continue to progress as tolerated.     Recommendations for follow up therapy are one component of a multi-disciplinary discharge planning process, led by the attending physician.  Recommendations may be updated based on patient status, additional functional criteria and insurance authorization.  Follow Up Recommendations Home health PT (pending amount of supervision)      Assistance Recommended at Discharge PRN  Patient can return home with the following  Assistance with cooking/housework;Assist for transportation;Help with stairs or ramp for entrance    Equipment Recommendations Rollator (4 wheels);Other (comment) (tub bench)  Recommendations for Other Services       Functional Status Assessment Patient has had a recent decline in their functional status and demonstrates the ability to make significant improvements in function in a reasonable and predictable amount of time.     Precautions / Restrictions Precautions Precautions: Sternal;Fall Restrictions Weight Bearing Restrictions: Yes (sternal  precautions)      Mobility  Bed Mobility Overal bed mobility: Modified Independent                  Transfers Overall transfer level: Needs assistance Equipment used: Rollator (4 wheels) Transfers: Sit to/from Stand Sit to Stand: Supervision           General transfer comment: cues for sternal precautions    Ambulation/Gait Ambulation/Gait assistance: Min guard Gait Distance (Feet): 300 Feet (150", 150") Assistive device: Rollator (4 wheels) Gait Pattern/deviations: Step-through pattern, Decreased stride length Gait velocity: decreased     General Gait Details: Slow and steady pace, able to self monitor for activity tolerance and fatigue  Stairs            Wheelchair Mobility    Modified Rankin (Stroke Patients Only)       Balance Overall balance assessment: Mild deficits observed, not formally tested                                           Pertinent Vitals/Pain Pain Assessment Pain Assessment: No/denies pain    Home Living Family/patient expects to be discharged to:: Private residence Living Arrangements: Alone Available Help at Discharge: Family;Available PRN/intermittently Type of Home: House Home Access: Stairs to enter   Entrance Stairs-Number of Steps: 1   Home Layout: One level Home Equipment: None      Prior Function Prior Level of Function : Independent/Modified Independent;Driving                     Hand Dominance        Extremity/Trunk Assessment  Upper Extremity Assessment Upper Extremity Assessment: Defer to OT evaluation    Lower Extremity Assessment Lower Extremity Assessment: Overall WFL for tasks assessed    Cervical / Trunk Assessment Cervical / Trunk Assessment: Kyphotic  Communication   Communication: No difficulties  Cognition Arousal/Alertness: Awake/alert Behavior During Therapy: WFL for tasks assessed/performed Overall Cognitive Status: Within Functional Limits for  tasks assessed                                          General Comments      Exercises     Assessment/Plan    PT Assessment Patient needs continued PT services  PT Problem List Decreased strength;Decreased activity tolerance;Decreased balance;Decreased mobility;Cardiopulmonary status limiting activity       PT Treatment Interventions DME instruction;Gait training;Stair training;Functional mobility training;Therapeutic activities;Therapeutic exercise;Balance training;Patient/family education    PT Goals (Current goals can be found in the Care Plan section)  Acute Rehab PT Goals Patient Stated Goal: go home to dog PT Goal Formulation: With patient Time For Goal Achievement: 04/07/22 Potential to Achieve Goals: Good    Frequency Min 3X/week     Co-evaluation               AM-PAC PT "6 Clicks" Mobility  Outcome Measure Help needed turning from your back to your side while in a flat bed without using bedrails?: None Help needed moving from lying on your back to sitting on the side of a flat bed without using bedrails?: None Help needed moving to and from a bed to a chair (including a wheelchair)?: A Little Help needed standing up from a chair using your arms (e.g., wheelchair or bedside chair)?: A Little Help needed to walk in hospital room?: A Little Help needed climbing 3-5 steps with a railing? : A Little 6 Click Score: 20    End of Session Equipment Utilized During Treatment: Oxygen Activity Tolerance: Patient tolerated treatment well Patient left: in chair;with call bell/phone within reach;with chair alarm set Nurse Communication: Mobility status PT Visit Diagnosis: Unsteadiness on feet (R26.81);Difficulty in walking, not elsewhere classified (R26.2)    Time: 9758-8325 PT Time Calculation (min) (ACUTE ONLY): 35 min   Charges:   PT Evaluation $PT Eval Moderate Complexity: 1 Mod PT Treatments $Therapeutic Activity: 8-22 mins         Wyona Almas, PT, DPT Acute Rehabilitation Services Office [phone]   Deno Etienne 03/24/2022, 10:26 AM

## 2022-03-24 NOTE — Progress Notes (Addendum)
Rounding Note    Patient Name: Alice [name] Date of Encounter: 03/24/2022  [place] Cardiologist: Freada Bergeron, MD   Subjective   Denies any SOB. Feeling good, had some sleep last night.   Inpatient Medications    Scheduled Meds:  acetaminophen  1,000 mg Oral Q6H   Or   acetaminophen (TYLENOL) oral liquid 160 mg/5 mL  1,000 mg Per Tube Q6H   aspirin  324 mg Oral Daily   cephALEXin  500 mg Oral Q8H   Chlorhexidine Gluconate Cloth  6 each Topical Daily   enoxaparin (LOVENOX) injection  30 mg Subcutaneous QHS   furosemide  40 mg Oral Daily   levothyroxine  112 mcg Per Tube q AM   metoprolol tartrate  12.5 mg Oral BID   Or   metoprolol tartrate  12.5 mg Per Tube BID   midodrine  5 mg Oral TID WC   omega-3 acid ethyl esters  1 g Oral Daily   pantoprazole  40 mg Oral Daily   potassium chloride SA  20 mEq Oral Daily   rosuvastatin  20 mg Oral Daily   sodium chloride flush  3 mL Intravenous Q12H   Continuous Infusions:  sodium chloride     PRN Meds: sodium chloride, alum & mag hydroxide-simeth, magnesium hydroxide, metoprolol tartrate, ondansetron (ZOFRAN) IV, mouth rinse, sodium chloride flush, traMADol   Vital Signs    Vitals:   03/23/22 2301 03/24/22 0318 03/24/22 0500 03/24/22 0731  BP: (!) 163/80 (!) 158/78  133/67  Pulse: 62 94  92  Resp: '20 18  18  '$ Temp: 98.7 F (37.1 C) 98 F (36.7 C)  98.6 F (37 C)  TempSrc: Oral Oral  Oral  SpO2: 96% 96%  94%  Weight:   65.3 kg   Height:        Intake/Output Summary (Last 24 hours) at 03/24/2022 0830 Last data filed at 03/24/2022 0734 Gross per 24 hour  Intake 240 ml  Output --  Net 240 ml      03/24/2022    5:00 AM 03/23/2022    4:30 AM 03/22/2022    5:00 AM  Last 3 Weights  Weight (lbs) 143 lb 15.4 oz 144 lb 1.6 oz 147 lb 8 oz  Weight (kg) 65.3 kg 65.363 kg 66.906 kg      Telemetry    NSR with HR 80s - Personally Reviewed  ECG    NSR with TWI in the inferior leads - Personally  Reviewed  Physical Exam   GEN: No acute distress.   Neck: No JVD Cardiac: RRR, no murmurs, rubs, or gallops.  Respiratory: Clear to auscultation bilaterally. GI: Soft, nontender, non-distended  MS: No edema; No deformity. Neuro:  Nonfocal  Psych: Normal affect   Labs    High Sensitivity Troponin:   Recent Labs  Lab 03/19/22 0628 03/19/22 0815  TROPONINIHS 116* 126*     Chemistry Recent Labs  Lab 03/20/22 0335 03/20/22 0652 03/20/22 1819 03/21/22 0424 03/22/22 0558 03/23/22 0221  NA 141   < > 140 139 137 136  K 3.8   < > 3.9 4.2 4.0 3.5  CL 110  --  110 107 104 102  CO2 25  --  '23 24 26 27  '$ GLUCOSE 97  --  126* 119* 102* 121*  BUN 15  --  14 17 24* 25*  CREATININE 0.78  --  0.81 0.90 0.99 1.01*  CALCIUM 8.0*  --  8.4* 8.3* 8.2* 8.1*  MG 3.1*  --  2.3  --   --   --   GFRNONAA >60  --  >60 >60 55* 53*  ANIONGAP 6  --  '7 8 7 7   '$ < > = values in this interval not displayed.    Lipids  Recent Labs  Lab 03/19/22 0815  CHOL 238*  TRIG 42  HDL 80  LDLCALC 150*  CHOLHDL 3.0    Hematology Recent Labs  Lab 03/21/22 0424 03/22/22 0558 03/23/22 0221  WBC 12.4* 12.3* 9.1  RBC 3.29* 3.07* 2.90*  HGB 9.5* 9.0* 8.2*  HCT 29.4* 27.3* 26.2*  MCV 89.4 88.9 90.3  MCH 28.9 29.3 28.3  MCHC 32.3 33.0 31.3  RDW 16.6* 16.6* 16.1*  PLT 110* 116* 132*   Thyroid No results for input(s): "TSH", "FREET4" in the last 168 hours.  BNPNo results for input(s): "BNP", "PROBNP" in the last 168 hours.  DDimer No results for input(s): "DDIMER" in the last 168 hours.   Radiology    DG Chest 2 View  Result Date: 03/23/2022 CLINICAL DATA:  Status post CABG. EXAM: CHEST - 2 VIEW COMPARISON:  03/22/2022. FINDINGS: 0444 hours. Low lung volumes accentuate the pulmonary vasculature and cardiomediastinal silhouette. Slightly increased pulmonary venous congestion. Unchanged small left and trace right pleural effusions. No pneumothorax. Stable cardiac and mediastinal contours status post  median sternotomy and CABG. Aortic atherosclerosis. IMPRESSION: Slightly increased pulmonary venous congestion with unchanged small left and trace right pleural effusions. Aortic Atherosclerosis (ICD10-I70.0). Electronically Signed   By: Emmit Alexanders M.D.   On: 03/23/2022 07:59    Cardiac Studies   Echo 03/19/2022  1. Left ventricular ejection fraction, by estimation, is 60 to 65%. The  left ventricle has normal function. The left ventricle has no regional  wall motion abnormalities. Left ventricular diastolic parameters are  consistent with Grade I diastolic  dysfunction (impaired relaxation).   2. Right ventricular systolic function is normal. The right ventricular  size is normal. There is mildly elevated pulmonary artery systolic  pressure.   3. Left atrial size was mildly dilated.   4. Large calcium deposit on PMVL.Marland Kitchen The mitral valve is degenerative. Mild  mitral valve regurgitation. No evidence of mitral stenosis. Moderate  mitral annular calcification.   5. The aortic valve is normal in structure. Aortic valve regurgitation is  not visualized. Aortic valve sclerosis is present, with no evidence of  aortic valve stenosis. Aortic valve area, by VTI measures 1.44 cm. Aortic  valve mean gradient measures 8.0  mmHg. Aortic valve Vmax measures 1.81 m/s.   6. The inferior vena cava is dilated in size with >50% respiratory  variability, suggesting right atrial pressure of 8 mmHg.    Patient Profile     89 y.o. female  with a hx of HLD, prior tobacco abuse, hypothyroidism and history of breast cancer was admitted on 03/19/2022 for NSTEMI. Subsequent cardiac cath revealed critical left main lesion, patient underwent urgent CABG x2 (LIMA-LAD, SVG-OM) on 1/22.   Assessment & Plan    NSTEMI/Critical LM disease  - Echo 03/19/2022 EF 60-65%, grade 1 DD, mild MR.   - Cath 1/22 showed 99% critical LM disease requiring IABP and emergent CABG x 2 (LIMA-LAD, SVG -OM). Extubated and IABP pulled  1/23  - on ASA 324 mg daily, Crestor '20mg'$  daily. Plan to start on plavix prior to DC given ACS - On low dose metoprolol 12.'5mg'$  BID. BP 130s to 160s, can probably D/C the low dose midodrine. Continue to  wean O2.    - currently on '40mg'$  daily PO lasix, appears to be euvolemic on exam. RLE more swollen from vein harvesting. LLE normal. Obtain BMET to follow renal function and electrolyte.   Post op anemia: Hgb trending down, repeat CBC this morning.   HLD: on crestor  Hypothyroidism      For questions or updates, please contact [place] Please consult [email or web address] for contact info under        Signed, Almyra Deforest, [place]  03/24/2022, 8:30 AM    I have seen and examined the patient along with Weston Brass, PA.  I have reviewed the chart, notes and new data.  I agree with PA's note.  Key new complaints: Feeling much better.  Had good rest.  Denies problems with pain. Key examination changes: Walking briskly down the corridor with the physical therapist.  Regular rate and rhythm, no pericardial rub, normal jugular venous pulsations.  Minimal swelling of the right lower extremity (saphenectomy harvest sites). Key new findings / data: Chemistry/hematology labs pending this morning.  PLAN: Stop midodrine. Appears close to discharge. Initiate clopidogrel at discharge (presented with acute coronary syndrome).  Sanda Klein, MD, [place] [phone] 03/24/2022, 9:16 AM

## 2022-03-25 ENCOUNTER — Inpatient Hospital Stay (HOSPITAL_COMMUNITY): Payer: Medicare PPO

## 2022-03-25 DIAGNOSIS — I214 Non-ST elevation (NSTEMI) myocardial infarction: Secondary | ICD-10-CM | POA: Diagnosis not present

## 2022-03-25 MED ORDER — METOPROLOL TARTRATE 25 MG PO TABS
25.0000 mg | ORAL_TABLET | Freq: Two times a day (BID) | ORAL | Status: DC
  Administered 2022-03-25 – 2022-03-26 (×4): 25 mg via ORAL
  Filled 2022-03-25 (×4): qty 1

## 2022-03-25 MED ORDER — POTASSIUM CHLORIDE CRYS ER 20 MEQ PO TBCR
20.0000 meq | EXTENDED_RELEASE_TABLET | Freq: Three times a day (TID) | ORAL | Status: DC
  Administered 2022-03-25 (×2): 20 meq via ORAL
  Filled 2022-03-25 (×2): qty 1

## 2022-03-25 MED ORDER — FUROSEMIDE 10 MG/ML IJ SOLN
40.0000 mg | Freq: Two times a day (BID) | INTRAMUSCULAR | Status: AC
  Administered 2022-03-25 (×2): 40 mg via INTRAVENOUS
  Filled 2022-03-25 (×2): qty 4

## 2022-03-25 NOTE — Progress Notes (Signed)
Patient ambulated in hallway with assistance and walker. Approximately 200 feet. Back in bed call bell with in reach. Basel Defalco, Bettina Gavia RN

## 2022-03-25 NOTE — Progress Notes (Addendum)
Rounding Note    Patient Name: Alice [name] Date of Encounter: 03/25/2022  [place] Cardiologist: Freada Bergeron, MD   Subjective   Denies any significant chest pain, ambulated yesterday with assistance  Inpatient Medications    Scheduled Meds:  aspirin  324 mg Oral Daily   cephALEXin  500 mg Oral Q8H   Chlorhexidine Gluconate Cloth  6 each Topical Daily   enoxaparin (LOVENOX) injection  30 mg Subcutaneous QHS   furosemide  40 mg Oral Daily   levothyroxine  112 mcg Per Tube q AM   metoprolol tartrate  12.5 mg Oral BID   Or   metoprolol tartrate  12.5 mg Per Tube BID   midodrine  5 mg Oral TID WC   omega-3 acid ethyl esters  1 g Oral Daily   pantoprazole  40 mg Oral Daily   potassium chloride SA  20 mEq Oral Daily   rosuvastatin  20 mg Oral Daily   sodium chloride flush  3 mL Intravenous Q12H   Continuous Infusions:  sodium chloride     PRN Meds: sodium chloride, alum & mag hydroxide-simeth, magnesium hydroxide, metoprolol tartrate, ondansetron (ZOFRAN) IV, mouth rinse, sodium chloride flush, traMADol   Vital Signs    Vitals:   03/24/22 2336 03/25/22 0433 03/25/22 0500 03/25/22 0743  BP: 131/62 (!) 147/77  (!) 151/77  Pulse:  79    Resp: '20 20  18  '$ Temp: 97.7 F (36.5 C) 98.9 F (37.2 C)    TempSrc: Oral Oral    SpO2:  94%  96%  Weight:   63.2 kg   Height:       No intake or output data in the 24 hours ending 03/25/22 0813    03/25/2022    5:00 AM 03/24/2022    5:00 AM 03/23/2022    4:30 AM  Last 3 Weights  Weight (lbs) 139 lb 5.3 oz 143 lb 15.4 oz 144 lb 1.6 oz  Weight (kg) 63.2 kg 65.3 kg 65.363 kg      Telemetry    NSR with occasional bursts of possible SVT (some of which appears to have retrograde p wave) - Personally Reviewed  ECG    NSR with TWI in the inferior leads - Personally Reviewed  Physical Exam   GEN: No acute distress.   Neck: No JVD Cardiac: RRR, no murmurs, rubs, or gallops.  Respiratory: Clear to  auscultation bilaterally. GI: Soft, nontender, non-distended  MS: No edema; No deformity. Neuro:  Nonfocal  Psych: Normal affect   Labs    High Sensitivity Troponin:   Recent Labs  Lab 03/19/22 0628 03/19/22 0815  TROPONINIHS 116* 126*     Chemistry Recent Labs  Lab 03/20/22 0335 03/20/22 0652 03/20/22 1819 03/21/22 0424 03/22/22 0558 03/23/22 0221 03/24/22 1559  NA 141   < > 140   < > 137 136 138  K 3.8   < > 3.9   < > 4.0 3.5 3.9  CL 110  --  110   < > 104 102 100  CO2 25  --  23   < > '26 27 27  '$ GLUCOSE 97  --  126*   < > 102* 121* 132*  BUN 15  --  14   < > 24* 25* 22  CREATININE 0.78  --  0.81   < > 0.99 1.01* 0.88  CALCIUM 8.0*  --  8.4*   < > 8.2* 8.1* 8.2*  MG 3.1*  --  2.3  --   --   --   --   GFRNONAA >60  --  >60   < > 55* 53* >60  ANIONGAP 6  --  7   < > '7 7 11   '$ < > = values in this interval not displayed.    Lipids  Recent Labs  Lab 03/19/22 0815  CHOL 238*  TRIG 42  HDL 80  LDLCALC 150*  CHOLHDL 3.0    Hematology Recent Labs  Lab 03/22/22 0558 03/23/22 0221 03/24/22 1559  WBC 12.3* 9.1 9.8  RBC 3.07* 2.90* 3.20*  HGB 9.0* 8.2* 9.1*  HCT 27.3* 26.2* 28.9*  MCV 88.9 90.3 90.3  MCH 29.3 28.3 28.4  MCHC 33.0 31.3 31.5  RDW 16.6* 16.1* 16.2*  PLT 116* 132* 202   Thyroid No results for input(s): "TSH", "FREET4" in the last 168 hours.  BNPNo results for input(s): "BNP", "PROBNP" in the last 168 hours.  DDimer No results for input(s): "DDIMER" in the last 168 hours.   Radiology    No results found.  Cardiac Studies   Echo 03/19/2022  1. Left ventricular ejection fraction, by estimation, is 60 to 65%. The  left ventricle has normal function. The left ventricle has no regional  wall motion abnormalities. Left ventricular diastolic parameters are  consistent with Grade I diastolic  dysfunction (impaired relaxation).   2. Right ventricular systolic function is normal. The right ventricular  size is normal. There is mildly elevated  pulmonary artery systolic  pressure.   3. Left atrial size was mildly dilated.   4. Large calcium deposit on PMVL.Marland Kitchen The mitral valve is degenerative. Mild  mitral valve regurgitation. No evidence of mitral stenosis. Moderate  mitral annular calcification.   5. The aortic valve is normal in structure. Aortic valve regurgitation is  not visualized. Aortic valve sclerosis is present, with no evidence of  aortic valve stenosis. Aortic valve area, by VTI measures 1.44 cm. Aortic  valve mean gradient measures 8.0  mmHg. Aortic valve Vmax measures 1.81 m/s.   6. The inferior vena cava is dilated in size with >50% respiratory  variability, suggesting right atrial pressure of 8 mmHg.    Cath 03/19/2022   Mid LM to Dist LM lesion is 99% stenosed.   Prox RCA lesion is 90% stenosed.   Mid LAD lesion is 60% stenosed. Diagnostic Dominance: Right     CT surgery 03/19/2022 PRE-OPERATIVE DIAGNOSIS:  CRITICAL LEFT MAIN CORONARY STENOSIS, ACUTE NON-STEMI   POST-OPERATIVE DIAGNOSIS:  CRITICAL LEFT MAIN CORONARY STENOSIS, ACUTE NON-STEMI   PROCEDURE:   EMERGENCY CORONARY ARTERY BYPASS GRAFTING (CABG) x 2 USING LEFT INTERNAL MAMMARY ARTERY AND RIGHT LEG GREATER SAPHENOUS VEIN HARVESTED ENDOSCOPICALLY    LIMA-LAD SVG-OM   Patient Profile     89 y.o. female with a hx of HLD, prior tobacco abuse, hypothyroidism and history of breast cancer was admitted on 03/19/2022 for NSTEMI. Subsequent cardiac cath revealed critical left main lesion, patient underwent urgent CABG x2 (LIMA-LAD, SVG-OM) on 1/22.    Assessment & Plan    NSTEMI/Critical LM disease             - Echo 03/19/2022 EF 60-65%, grade 1 DD, mild MR.              - Cath 1/22 showed 99% critical LM disease requiring IABP and emergent CABG x 2 (LIMA-LAD, SVG -OM). Extubated and IABP pulled 1/23             -  on ASA 324 mg daily, Crestor '20mg'$  daily. Plan to start on plavix prior to DC given ACS - On low dose metoprolol 12.'5mg'$  BID. Midodrine  off 1/27. Continue to wean O2.               - currently on '40mg'$  daily PO lasix, appears to be euvolemic on exam. RLE more swollen from vein harvesting. LLE slight edema this morning. Some fast bursts overnight, all are transient, possible SVT (as some appears to have retrograde p waves), increase metoprolol to '25mg'$  BID.    Post op anemia: Hgb stable on repeat 1/27.    HLD: on crestor   Hypothyroidism        For questions or updates, please contact [place] Please consult [email or web address] for contact info under        Signed, Almyra Deforest, [place]  03/25/2022, 8:13 AM    I have seen and examined the patient along with Almyra Deforest, PA.  I have reviewed the chart, notes and new data.  I agree with PA/NP's note.  Key new complaints: dyspneic at rest/ orthopneic today, yesterday walked in hall without difficulty. Key examination changes: RRR, no murmurs, a few basal lung rales, elevated JVP, 2+ calf edema bilaterally Key new findings / data: CXR ordered, not done yet. K 3.9 yesterday. 2 brief episodes of narrow complex tachycardia - brief atypical flutter w 2:1 AV conduction? Versus atrial tachycardia  PLAN: Change furosemide to IV.  Consider limited echo. Recheck BMET in AM.  Sanda Klein, MD, [place] [phone] 03/25/2022, 9:48 AM

## 2022-03-25 NOTE — Progress Notes (Addendum)
[address]       [place] [address]             [phone]        6 Days Post-Op Procedure(s) (LRB): CORONARY ARTERY BYPASS GRAFTING (CABG) X TWO, USING LEFT INTERNAL MAMMARY ARTERY AND RIGHT LEG GREATER SAPHENOUS VEIN HARVESTED ENDOSCOPICALLY (N/A) TRANSESOPHAGEAL ECHOCARDIOGRAM (N/A)  Subjective: Awake and cooperative but confused. Does not recall why she is here. More short of breath today.   Objective: Vital signs in last 24 hours: Temp:  [97.4 F (36.3 C)-98.9 F (37.2 C)] 98.9 F (37.2 C) (01/28 0433) Pulse Rate:  [79-128] 90 (01/28 0954) Cardiac Rhythm: Normal sinus rhythm (01/28 0900) Resp:  [18-32] 27 (01/28 0900) BP: (131-153)/(60-87) 149/85 (01/28 0929) SpO2:  [92 %-100 %] 97 % (01/28 0900) Weight:  [63.2 kg] 63.2 kg (01/28 0500)  Pre op weight  54.4 kg Current Weight  03/25/22 63.2 kg      Intake/Output from previous day: 01/27 0701 - 01/28 0700 In: 240 [P.O.:240] Out: -    Physical Exam:  Cardiovascular: RRR, monitor showing SR with a few episodes of SVT Pulmonary: breath sounds shallow and non-labored.  Abdomen: Soft, non tender, bowel sounds present. Extremities: has more lower extremity edema today. Left forearm with mild erythema, warmth. Wounds: Clean and dry.  No erythema or signs of infection.  Lab Results: CBC: Recent Labs    03/23/22 0221 03/24/22 1559  WBC 9.1 9.8  HGB 8.2* 9.1*  HCT 26.2* 28.9*  PLT 132* 202    BMET:  Recent Labs    03/23/22 0221 03/24/22 1559  NA 136 138  K 3.5 3.9  CL 102 100  CO2 27 27  GLUCOSE 121* 132*  BUN 25* 22  CREATININE 1.01* 0.88  CALCIUM 8.1* 8.2*     PT/INR:  Lab Results  Component Value Date   INR 1.5 (H) 03/19/2022   INR 0.94 09/10/2014   ABG:  INR: Will add last result for INR, ABG once components are confirmed Will add last 4 CBG results once components are confirmed  Assessment/Plan:  CV-S/p NSTEMI. SR, On Lopressor 12.5 mg bid with  parameters in place. Midodrine discontinued, SBP now 130-150.  Plan to start Plavix at discharge for ACS. Pulmonary-short of breath, slight increase WOB today. Agree with IV Lasix this morning. Checking the CXR.  Volume overload-unsure of accuracy of pre-op Wt. Diuresing. Expected post op blood loss anemia-follow up lab tomorrow. 6.   Thrombocytopenia-resolved, On Lovenox and ec asa 7.   History of hypothyroidism-continue Levothyroxine 112 mcg daily 8.   Mild confusion-no gross neuro deficit 9.   Phlebitis-left forearm improving. Continue Keflex, warm compresses 10. Will need SNF at discharge as she lives alone, CM team aware and PT / OT consults noted.   Antony Odea, PA-C [phone] 10:11 AM  Agree with above Dispo planning  Lajuana Matte

## 2022-03-25 NOTE — Progress Notes (Signed)
Called into patient room, patient with increased work of breathing and with some intermittent expiratory wheezing, [facility] on floor and made aware, also Cardiology in to see patient. Orders received. Call bell within reach. Lanessa Shill, Bettina Gavia RN

## 2022-03-25 NOTE — Progress Notes (Signed)
Alice [name] continues to maintain oxygen saturations of 95 to 97% on 2 L nasal cannula.  The chest x-ray has not been read but the image was reviewed.  She continues to have some central pulmonary vascular congestion and slightly enlarged left pleural effusion.  Will continue with the diuresis and repeat the x-ray tomorrow morning.  She may benefit from left thoracentesis if effusion does not resolve with diuresis.

## 2022-03-25 NOTE — Progress Notes (Signed)
Patient with brief episode of HR up to 132 on monitor. After brushing teeth. Back in NSR rate of 87. Call bell with in reach. Stclair Szymborski, Bettina Gavia RN

## 2022-03-26 ENCOUNTER — Inpatient Hospital Stay (HOSPITAL_COMMUNITY): Payer: Medicare PPO

## 2022-03-26 DIAGNOSIS — I3139 Other pericardial effusion (noninflammatory): Secondary | ICD-10-CM | POA: Diagnosis not present

## 2022-03-26 DIAGNOSIS — I214 Non-ST elevation (NSTEMI) myocardial infarction: Secondary | ICD-10-CM | POA: Diagnosis not present

## 2022-03-26 HISTORY — PX: IR THORACENTESIS ASP PLEURAL SPACE W/IMG GUIDE: IMG5380

## 2022-03-26 LAB — BASIC METABOLIC PANEL
Anion gap: 11 (ref 5–15)
BUN: 18 mg/dL (ref 8–23)
CO2: 32 mmol/L (ref 22–32)
Calcium: 8.5 mg/dL — ABNORMAL LOW (ref 8.9–10.3)
Chloride: 94 mmol/L — ABNORMAL LOW (ref 98–111)
Creatinine, Ser: 0.91 mg/dL (ref 0.44–1.00)
GFR, Estimated: >60 mL/min (ref >60)
Glucose, Bld: 122 mg/dL — ABNORMAL HIGH (ref 70–99)
Potassium: 3.6 mmol/L (ref 3.5–5.1)
Sodium: 137 mmol/L (ref 135–145)

## 2022-03-26 LAB — ECHOCARDIOGRAM LIMITED
Height: 62 in
Weight: 2116.42 oz

## 2022-03-26 LAB — MAGNESIUM: Magnesium: 1.8 mg/dL (ref 1.7–2.4)

## 2022-03-26 MED ORDER — LIDOCAINE HCL 1 % IJ SOLN
INTRAMUSCULAR | Status: AC
  Administered 2022-03-26: 10 mL
  Filled 2022-03-26: qty 20

## 2022-03-26 MED ORDER — PERFLUTREN LIPID MICROSPHERE
1.0000 mL | INTRAVENOUS | Status: AC | PRN
  Administered 2022-03-26: 3 mL via INTRAVENOUS

## 2022-03-26 MED ORDER — POTASSIUM CHLORIDE CRYS ER 20 MEQ PO TBCR
30.0000 meq | EXTENDED_RELEASE_TABLET | Freq: Two times a day (BID) | ORAL | Status: DC
  Administered 2022-03-26 – 2022-04-03 (×17): 30 meq via ORAL
  Filled 2022-03-26 (×17): qty 1

## 2022-03-26 MED ORDER — ASPIRIN 81 MG PO CHEW: 324.0000 mg | CHEWABLE_TABLET | Freq: Every day | ORAL | Status: DC

## 2022-03-26 MED ORDER — FUROSEMIDE 40 MG PO TABS
40.0000 mg | ORAL_TABLET | Freq: Every day | ORAL | Status: DC
  Administered 2022-03-26 – 2022-03-27 (×2): 40 mg via ORAL
  Filled 2022-03-26 (×2): qty 1

## 2022-03-26 MED ORDER — LISINOPRIL 5 MG PO TABS
5.0000 mg | ORAL_TABLET | Freq: Every day | ORAL | Status: DC
  Administered 2022-03-26: 5 mg via ORAL
  Filled 2022-03-26: qty 1

## 2022-03-26 NOTE — Progress Notes (Addendum)
[address]       [place] [address]             [phone]        7 Days Post-Op Procedure(s) (LRB): CORONARY ARTERY BYPASS GRAFTING (CABG) X TWO, USING LEFT INTERNAL MAMMARY ARTERY AND RIGHT LEG GREATER SAPHENOUS VEIN HARVESTED ENDOSCOPICALLY (N/A) TRANSESOPHAGEAL ECHOCARDIOGRAM (N/A)  Subjective: Patient awake, alert this am. She has had intermittent confusion post op. She has pain on left posterior back  Objective: Vital signs in last 24 hours: Temp:  [97.7 F (36.5 C)-98.6 F (37 C)] 98.6 F (37 C) (01/29 0412) Pulse Rate:  [73-90] 73 (01/29 0412) Cardiac Rhythm: Normal sinus rhythm (01/28 1942) Resp:  [18-27] 20 (01/29 0009) BP: (104-155)/(57-91) 155/68 (01/28 2241) SpO2:  [85 %-99 %] 92 % (01/29 0412) Weight:  [60 kg] 60 kg (01/29 0500)  Pre op weight  54.4 kg Current Weight  03/26/22 60 kg      Intake/Output from previous day: 01/28 0701 - 01/29 0700 In: 600 [P.O.:600] Out: 3650 [Urine:3650]   Physical Exam:  Cardiovascular: RRR Pulmonary: Slightly diminished at bases Abdomen: Soft, non tender, bowel sounds present. Extremities: Mild bilateral lower extremity edema. Left forearm with much less erythema. Wounds: Clean and dry.  No erythema or signs of infection.  Lab Results: CBC: Recent Labs    03/24/22 1559  WBC 9.8  HGB 9.1*  HCT 28.9*  PLT 202    BMET:  Recent Labs    03/24/22 1559 03/26/22 0103  NA 138 137  K 3.9 3.6  CL 100 94*  CO2 27 32  GLUCOSE 132* 122*  BUN 22 18  CREATININE 0.88 0.91  CALCIUM 8.2* 8.5*     PT/INR:  Lab Results  Component Value Date   INR 1.5 (H) 03/19/2022   INR 0.94 09/10/2014   ABG:  INR: Will add last result for INR, ABG once components are confirmed Will add last 4 CBG results once components are confirmed  Assessment/Plan:  CV-S/p NSTEMI. She had brief episode of increased HR last evening. SR this am and hypertensive. On Lopressor 25 mg bid. Will start Lisinopril  for better BP control. If has any further episodes of increased heart rate above 120, will consider oral Amiodarone Pulmonary-On 2 liters of oxygen via [place]. Await this am's CXR to determine if needs thoracentesis. Encourage incentive spirometer and flutter valve Volume overload-on Lasix 40 mg daily Expected post op blood loss anemia-Last H and H stable at 9.1 and 28.9 5. History of hypothyroidism-continue Levothyroxine 112 mcg daily 6. Mild confusion-no gross neuro deficit 7. Supplement potassium 8. Phlebitis-on Keflex, warm compresses 9. Will need SNF at discharge;hopefully, 1-2 days (obtain CXR and determine if needs thoracentesis)  Donielle M ZimmermanPA-C 7:01 AM  Agree with above [place]

## 2022-03-26 NOTE — Progress Notes (Signed)
Rounding Note    Patient Name: Alice [name] Date of Encounter: 03/26/2022  [place] Cardiologist: Freada Bergeron, MD   Subjective   Remains tachypneic. Good diuresis, net -3 L in last 24 hours, almost back to balanced in/out since admission the recording has been accurate.  Weight is down to 132 pounds, which matches her average weight at office visits in 2020-2021 (do not have more recent data; recorded weight on admission 120 pounds was probably just an estimation). Breathing better. Chest x-ray still shows an unchanged moderate left pleural effusion.  No signs of pulmonary edema.  Inpatient Medications    Scheduled Meds:  aspirin  324 mg Oral Daily   cephALEXin  500 mg Oral Q8H   Chlorhexidine Gluconate Cloth  6 each Topical Daily   enoxaparin (LOVENOX) injection  30 mg Subcutaneous QHS   furosemide  40 mg Oral Daily   levothyroxine  112 mcg Per Tube q AM   lisinopril  5 mg Oral Daily   metoprolol tartrate  25 mg Oral BID   omega-3 acid ethyl esters  1 g Oral Daily   pantoprazole  40 mg Oral Daily   potassium chloride SA  30 mEq Oral BID   rosuvastatin  20 mg Oral Daily   sodium chloride flush  3 mL Intravenous Q12H   Continuous Infusions:  sodium chloride     PRN Meds: sodium chloride, alum & mag hydroxide-simeth, magnesium hydroxide, metoprolol tartrate, ondansetron (ZOFRAN) IV, mouth rinse, sodium chloride flush, traMADol   Vital Signs    Vitals:   03/26/22 0009 03/26/22 0412 03/26/22 0500 03/26/22 0800  BP:      Pulse: 82 73    Resp: 20     Temp: 97.7 F (36.5 C) 98.6 F (37 C)  97.6 F (36.4 C)  TempSrc: Oral Oral  Oral  SpO2: 90% 92%    Weight:   60 kg   Height:        Intake/Output Summary (Last 24 hours) at 03/26/2022 0948 Last data filed at 03/26/2022 0500 Gross per 24 hour  Intake 600 ml  Output 3650 ml  Net -3050 ml      03/26/2022    5:00 AM 03/25/2022    5:00 AM 03/24/2022    5:00 AM  Last 3 Weights  Weight (lbs)  132 lb 4.4 oz 139 lb 5.3 oz 143 lb 15.4 oz  Weight (kg) 60 kg 63.2 kg 65.3 kg      Telemetry    Mostly sinus rhythm; had a roughly 4-5-minute episode of narrow complex tachycardia yesterday evening around 7 PM, hard to distinguish whether it is ectopic atrial tachycardia or atypical atrial flutter with 2: 1 AV block.- Personally Reviewed  ECG    No new- Personally Reviewed  Physical Exam  Respiratory rate approximately 30, otherwise appears comfortable. GEN: No acute distress.   Neck: 4-5 cm JVD Cardiac: RRR, no murmurs, rubs, or gallops.  Respiratory: Diminished breath sounds in the left base, dull to percussion GI: Soft, nontender, non-distended  MS: 2+ ankle edema; No deformity. Neuro:  Nonfocal  Psych: Normal affect   Labs    High Sensitivity Troponin:   Recent Labs  Lab 03/19/22 0628 03/19/22 0815  TROPONINIHS 116* 126*     Chemistry Recent Labs  Lab 03/20/22 0335 03/20/22 0652 03/20/22 1819 03/21/22 0424 03/23/22 0221 03/24/22 1559 03/26/22 0103  NA 141   < > 140   < > 136 138 137  K 3.8   < >  3.9   < > 3.5 3.9 3.6  CL 110  --  110   < > 102 100 94*  CO2 25  --  23   < > 27 27 32  GLUCOSE 97  --  126*   < > 121* 132* 122*  BUN 15  --  14   < > 25* 22 18  CREATININE 0.78  --  0.81   < > 1.01* 0.88 0.91  CALCIUM 8.0*  --  8.4*   < > 8.1* 8.2* 8.5*  MG 3.1*  --  2.3  --   --   --  1.8  GFRNONAA >60  --  >60   < > 53* >60 >60  ANIONGAP 6  --  7   < > '7 11 11   '$ < > = values in this interval not displayed.    Lipids No results for input(s): "CHOL", "TRIG", "HDL", "LABVLDL", "LDLCALC", "CHOLHDL" in the last 168 hours.  Hematology Recent Labs  Lab 03/22/22 0558 03/23/22 0221 03/24/22 1559  WBC 12.3* 9.1 9.8  RBC 3.07* 2.90* 3.20*  HGB 9.0* 8.2* 9.1*  HCT 27.3* 26.2* 28.9*  MCV 88.9 90.3 90.3  MCH 29.3 28.3 28.4  MCHC 33.0 31.3 31.5  RDW 16.6* 16.1* 16.2*  PLT 116* 132* 202   Thyroid No results for input(s): "TSH", "FREET4" in the last 168 hours.   BNPNo results for input(s): "BNP", "PROBNP" in the last 168 hours.  DDimer No results for input(s): "DDIMER" in the last 168 hours.   Radiology    DG Chest 2 View  Result Date: 03/26/2022 CLINICAL DATA:  Pleural effusion EXAM: CHEST - 2 VIEW COMPARISON:  CXR 03/25/22 FINDINGS: Postsurgical changes from median sternotomy and CABG. Persistent left-sided pleural effusion, unchanged from prior exam. No pneumothorax. Hazy opacity at the left lung base is favored to represent atelectasis. No new focal airspace opacity. Cardiac and mediastinal contours are unchanged from prior exam. Aortic atherosclerotic calcifications. No displaced rib fracture. Visualized upper abdomen is unremarkable. IMPRESSION: 1. Persistent left-sided pleural effusion, unchanged from prior exam. 2. Hazy opacity at the left lung base is favored to represent atelectasis. Electronically Signed   By: Marin Roberts M.D.   On: 03/26/2022 09:41   DG CHEST PORT 1 VIEW  Result Date: 03/25/2022 CLINICAL DATA:  Shortness of breath, chest pain. EXAM: PORTABLE CHEST 1 VIEW COMPARISON:  March 23, 2022. FINDINGS: Stable cardiomediastinal silhouette. Status post coronary bypass graft. Stable small left pleural effusion is noted with associated left basilar atelectasis or infiltrate. Minimal right basilar subsegmental atelectasis is noted. Bony thorax is unremarkable. IMPRESSION: Stable small left pleural effusion is noted with associated left basilar atelectasis or infiltrate. Electronically Signed   By: Marijo Conception M.D.   On: 03/25/2022 13:03    Cardiac Studies   Echo 03/19/2022  1. Left ventricular ejection fraction, by estimation, is 60 to 65%. The  left ventricle has normal function. The left ventricle has no regional  wall motion abnormalities. Left ventricular diastolic parameters are  consistent with Grade I diastolic  dysfunction (impaired relaxation).   2. Right ventricular systolic function is normal. The right ventricular   size is normal. There is mildly elevated pulmonary artery systolic  pressure.   3. Left atrial size was mildly dilated.   4. Large calcium deposit on PMVL.Marland Kitchen The mitral valve is degenerative. Mild  mitral valve regurgitation. No evidence of mitral stenosis. Moderate  mitral annular calcification.   5. The aortic valve is  normal in structure. Aortic valve regurgitation is  not visualized. Aortic valve sclerosis is present, with no evidence of  aortic valve stenosis. Aortic valve area, by VTI measures 1.44 cm. Aortic  valve mean gradient measures 8.0  mmHg. Aortic valve Vmax measures 1.81 m/s.   6. The inferior vena cava is dilated in size with >50% respiratory  variability, suggesting right atrial pressure of 8 mmHg.      Cath 03/19/2022   Mid LM to Dist LM lesion is 99% stenosed.   Prox RCA lesion is 90% stenosed.   Mid LAD lesion is 60% stenosed. Diagnostic Dominance: Right        CT surgery 03/19/2022 PRE-OPERATIVE DIAGNOSIS:  CRITICAL LEFT MAIN CORONARY STENOSIS, ACUTE NON-STEMI   POST-OPERATIVE DIAGNOSIS:  CRITICAL LEFT MAIN CORONARY STENOSIS, ACUTE NON-STEMI   PROCEDURE:   EMERGENCY CORONARY ARTERY BYPASS GRAFTING (CABG) x 2 USING LEFT INTERNAL MAMMARY ARTERY AND RIGHT LEG GREATER SAPHENOUS VEIN HARVESTED ENDOSCOPICALLY    LIMA-LAD SVG-OM  Patient Profile     89 y.o. female with a hx of HLD, prior tobacco abuse, hypothyroidism and history of breast cancer was admitted on 03/19/2022 for NSTEMI. Subsequent cardiac cath revealed critical left main lesion, patient underwent urgent CABG x2 (LIMA-LAD, SVG-OM) on 1/22.     Assessment & Plan    CAD s/p NSTEMI s/p urgent CABG: Short of breath, possible due to left pleural effusion, consider thoracentesis.  Probably close to her preoperative "dry weight".  Might be a good idea to repeat a limited echo for wall motion/EF/pericardial effusion.  On statin and aspirin.  Plan to start clopidogrel at discharge since he presented with  acute coronary event. ATachy: Versus atrial flutter with 2: 1 AV block.  Brief, on beta-blocker.  Amiodarone does not appear to be necessary at this point. Anemia, postop. Stable     For questions or updates, please contact [place] Please consult [email or web address] for contact info under        Signed, Sanda Klein, MD  03/26/2022, 9:48 AM

## 2022-03-26 NOTE — Progress Notes (Signed)
Mobility Specialist Progress Note:   03/26/22 1035  Mobility  Activity Ambulated with assistance in hallway  Level of Assistance Contact guard assist, steadying assist  Assistive Device None (occasional hall rails)  Distance Ambulated (ft) 250 ft  Activity Response Tolerated well  $Mobility charge 1 Mobility   Pt in bed asking to ambulate. No complaints of pain. Left in chair with call bell in reach and all needs met.   Alice [name] Mobility Specialist Please contact via [employer] or  Rehab Office at [phone]

## 2022-03-26 NOTE — Progress Notes (Signed)
Echocardiogram 2D Echocardiogram has been performed.  Oneal Deputy Willliam Pettet RDCS 03/26/2022, 12:43 PM

## 2022-03-26 NOTE — Progress Notes (Signed)
Patient has adamantly  refusing to have I.V. restart.. Explain the importance of needing and I.V. and still refuses I.V. at this time.Patient pulled I.V. at at 22:10.

## 2022-03-26 NOTE — Procedures (Signed)
PROCEDURE SUMMARY:  Successful US guided left thoracentesis. Yielded 700cc of bloody fluid. Pt tolerated procedure well. No immediate complications.  Specimen not sent for labs. CXR ordered.  EBL negligible  Leylanie Woodmansee PA-C 03/26/2022 4:03 PM

## 2022-03-26 NOTE — Progress Notes (Signed)
Consulted to restart I.V. removed by patient. Patient refused restart. RN aware and will notify I.V. team if patient changes her mind.

## 2022-03-27 ENCOUNTER — Inpatient Hospital Stay (HOSPITAL_COMMUNITY): Payer: Medicare PPO

## 2022-03-27 DIAGNOSIS — I214 Non-ST elevation (NSTEMI) myocardial infarction: Secondary | ICD-10-CM | POA: Diagnosis not present

## 2022-03-27 LAB — BASIC METABOLIC PANEL
Anion gap: 10 (ref 5–15)
BUN: 18 mg/dL (ref 8–23)
CO2: 37 mmol/L — ABNORMAL HIGH (ref 22–32)
Calcium: 8.6 mg/dL — ABNORMAL LOW (ref 8.9–10.3)
Chloride: 92 mmol/L — ABNORMAL LOW (ref 98–111)
Creatinine, Ser: 0.82 mg/dL (ref 0.44–1.00)
GFR, Estimated: >60 mL/min (ref >60)
Glucose, Bld: 110 mg/dL — ABNORMAL HIGH (ref 70–99)
Potassium: 3.7 mmol/L (ref 3.5–5.1)
Sodium: 139 mmol/L (ref 135–145)

## 2022-03-27 LAB — CREATININE, SERUM
Creatinine, Ser: 0.76 mg/dL (ref 0.44–1.00)
GFR, Estimated: >60 mL/min (ref >60)

## 2022-03-27 MED ORDER — METOPROLOL TARTRATE 25 MG PO TABS: 25.0000 mg | ORAL_TABLET | Freq: Two times a day (BID) | ORAL | Status: DC

## 2022-03-27 MED ORDER — FUROSEMIDE 40 MG PO TABS: 40.0000 mg | ORAL_TABLET | Freq: Every day | ORAL | 0 refills | Status: DC

## 2022-03-27 MED ORDER — METOPROLOL TARTRATE 50 MG PO TABS
50.0000 mg | ORAL_TABLET | Freq: Two times a day (BID) | ORAL | Status: DC
  Administered 2022-03-27 – 2022-04-02 (×12): 50 mg via ORAL
  Filled 2022-03-27 (×12): qty 1

## 2022-03-27 MED ORDER — POTASSIUM CHLORIDE CRYS ER 20 MEQ PO TBCR: 30.0000 meq | EXTENDED_RELEASE_TABLET | Freq: Every day | ORAL | 0 refills | Status: DC

## 2022-03-27 MED ORDER — TRAMADOL HCL 50 MG PO TABS: 50.0000 mg | ORAL_TABLET | Freq: Four times a day (QID) | ORAL | 0 refills | Status: DC | PRN

## 2022-03-27 MED ORDER — ROSUVASTATIN CALCIUM 20 MG PO TABS: 20.0000 mg | ORAL_TABLET | Freq: Every day | ORAL | Status: DC

## 2022-03-27 MED ORDER — LISINOPRIL 10 MG PO TABS: 10.0000 mg | ORAL_TABLET | Freq: Every day | ORAL | Status: DC

## 2022-03-27 MED ORDER — METOPROLOL TARTRATE 50 MG PO TABS: 50.0000 mg | ORAL_TABLET | Freq: Two times a day (BID) | ORAL | Status: DC

## 2022-03-27 MED ORDER — LISINOPRIL 10 MG PO TABS
10.0000 mg | ORAL_TABLET | Freq: Every day | ORAL | Status: DC
  Administered 2022-03-27: 10 mg via ORAL
  Filled 2022-03-27: qty 1

## 2022-03-27 NOTE — Progress Notes (Signed)
Unable to wean pt off 02. Pt's 02 RA at rest= 86%.  Lavenia Atlas, RN

## 2022-03-27 NOTE — Progress Notes (Addendum)
[address]       [place] [address]             [phone]        8 Days Post-Op Procedure(s) (LRB): CORONARY ARTERY BYPASS GRAFTING (CABG) X TWO, USING LEFT INTERNAL MAMMARY ARTERY AND RIGHT LEG GREATER SAPHENOUS VEIN HARVESTED ENDOSCOPICALLY (N/A) TRANSESOPHAGEAL ECHOCARDIOGRAM (N/A)  Subjective: Patient asleep this am. She states mouth dry;gave her sips of water. Of note, she has had intermittent confusion post op.   Objective: Vital signs in last 24 hours: Temp:  [97.6 F (36.4 C)-98.3 F (36.8 C)] 98.1 F (36.7 C) (01/30 0434) Pulse Rate:  [66-81] 66 (01/30 0434) Cardiac Rhythm: Normal sinus rhythm (01/29 2100) Resp:  [16-22] 16 (01/30 0434) BP: (115-140)/(54-75) 140/75 (01/30 0434) SpO2:  [94 %-97 %] 97 % (01/30 0434) Weight:  [57.9 kg] 57.9 kg (01/30 0434)  Pre op weight  54.4 kg Current Weight  03/27/22 57.9 kg      Intake/Output from previous day: 01/29 0701 - 01/30 0700 In: 400 [P.O.:400] Out: 950 [Urine:950]   Physical Exam:  Cardiovascular: RRR Pulmonary: Slightly diminished at bases Abdomen: Soft, non tender, bowel sounds present. Extremities: Mild bilateral lower extremity edema. Left forearm with much less erythema. Wounds: Clean and dry.  No erythema or signs of infection.  Lab Results: CBC: Recent Labs    03/24/22 1559  WBC 9.8  HGB 9.1*  HCT 28.9*  PLT 202    BMET:  Recent Labs    03/24/22 1559 03/26/22 0103  NA 138 137  K 3.9 3.6  CL 100 94*  CO2 27 32  GLUCOSE 132* 122*  BUN 22 18  CREATININE 0.88 0.91  CALCIUM 8.2* 8.5*     PT/INR:  Lab Results  Component Value Date   INR 1.5 (H) 03/19/2022   INR 0.94 09/10/2014   ABG:  INR: Will add last result for INR, ABG once components are confirmed Will add last 4 CBG results once components are confirmed  Assessment/Plan:  CV-S/p NSTEMI. She had brief episode of atrial tachycardia 01/28.  SR this am and mildly hypertensive. On Lopressor 25 mg  bid and Lisinopril 5 mg daily. Will increase Lisinopril for better BP control. May need oral Amiodarone. Had some atrial tachycardia, vent trigeminy yesterday. Cardiology following Pulmonary-On 2 liters of oxygen via . She does desat on room air and with ambulation below 88% so will arrange oxygen at discharge. S/p left thoracentesis with 700 cc removed. Await this am's CXR.  Encourage incentive spirometer and flutter valve Volume overload-on Lasix 40 mg daily Expected post op blood loss anemia-Last H and H stable at 9.1 and 28.9 5. History of hypothyroidism-continue Levothyroxine 112 mcg daily 6. Mild confusion-no gross neuro deficit 7. To SNF when bed available  Donielle M ZimmermanPA-C 7:04 AM  Agree with above Thoracentesis today [place]

## 2022-03-27 NOTE — TOC Initial Note (Signed)
Transition of Care [facility]) - Initial/Assessment Note    Patient Details  Name: Alice [name] MRN: [id number] Date of Birth: [birth year]  [facility] CM/SW Contact:    Vinie Sill, LCSW Phone Number: 03/27/2022, 5:54 PM  Clinical Narrative:                  CSW spoke with patient's Marya Amsler- CSW introduced self and explained role. CSW discussed therapy recommendation of home w/ HH. He states patient lives home alone. Himself and the patient's other sons lives out of the area. CSW informed received notice family wants SNF.  He states they are not expressing she needs to be at SNF verses home, they want to whatever is in the best interest of the patient. If she returns home they will need to decide if they are "switching off" with her care or if they will have to hire someone. CSW explained the SNF process verses HH and what insurance will cover. He states he alone does not make her decisions. He will discuss with family and call CSW back.  TOC will continue to follow and assist with discharge planning.    Barriers to Discharge:  (family will discuss options and call CSW back)   Patient Goals and CMS Choice            Expected Discharge Plan and Services In-house Referral: Clinical Social Work                                            Prior Living Arrangements/Services   Lives with:: Self          Need for Family Participation in Patient Care: Yes (Comment) Care giver support system in place?: Yes (comment)      Activities of Daily Living      Permission Sought/Granted                  Emotional Assessment       Orientation: : Oriented to  Time, Oriented to Place, Oriented to Self Alcohol / Substance Use: Not Applicable Psych Involvement: No (comment)  Admission diagnosis:  Elevated blood pressure reading without diagnosis of hypertension [R03.0] NSTEMI (non-ST elevated myocardial infarction) ([place]) [I21.4] Nonspecific chest pain  [R07.9] S/P CABG x 2 [Z95.1] Patient Active Problem List   Diagnosis Date Noted   NSTEMI (non-ST elevated myocardial infarction) ([place]) 03/19/2022   S/P CABG x 2 03/19/2022   Depression, recurrent ([place]) 02/01/2020   Atherosclerosis of aorta ([place]) 09/15/2018   Iron deficiency anemia due to chronic blood loss 08/12/2016   Mild anemia 07/06/2016   Left torticollis 08/11/2015   Hypothyroidism due to acquired atrophy of thyroid 08/11/2015   Internal and external bleeding hemorrhoids 08/11/2015   Osteoarthritis of right hip 09/10/2014   Status post total replacement of right hip 09/10/2014   Malignant neoplasm of upper-outer quadrant of right breast in female, estrogen receptor positive ([place]) 01/07/2013   Arthralgia 12/08/2012   Anxiety 02/12/2012   Cat bite of finger 08/10/2011   Cellulitis 08/10/2011   PCP:  Lauree Chandler, NP Pharmacy:   [facility] [id number] - [address] [place] [address] [place] [address] Phone: [phone] Fax: [phone]     Social Determinants of Health (SDOH) Social History: SDOH Screenings   Food Insecurity: No Food Insecurity (10/14/2017)  Transportation Needs: No  Transportation Needs (10/14/2017)  Alcohol Screen: Low Risk  (05/01/2018)  Depression (PHQ2-9): Medium Risk (02/01/2020)  Financial Resource Strain: Low Risk  (10/14/2017)  Physical Activity: Sufficiently Active (10/14/2017)  Social Connections: Somewhat Isolated (10/14/2017)  Stress: Stress Concern Present (10/14/2017)  Tobacco Use: Medium Risk (03/26/2022)   SDOH Interventions:     Readmission Risk Interventions     No data to display

## 2022-03-27 NOTE — Progress Notes (Signed)
Removed chest tube suture per order. Pt tolerated well.   Lavenia Atlas, RN\

## 2022-03-27 NOTE — Progress Notes (Signed)
Physical Therapy Treatment Patient Details Name: Alice [name] MRN: [id number] DOB: Aug 30, 1930 Today's Date: 03/27/2022   History of Present Illness Pt is a 89 y.o. 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 Significant PMH: HLD, prior tobacco abuse, breast cancaer, hypothyroidism.    PT Comments    Patient progressing slowly towards PT goals. Session focused on progressive ambulation and functional strengthening. Pt asleep upon arrival and drowsy during session needing cues to keep eyes open. Requires cues to adhere to sternal precautions throughout mobility. Reports feeling weak this afternoon needing Min A for gait training. Some mild SOB noted. Worked on LE strengthening with sit to stands from EOB. Pt eager to return home.  Will follow acutely.   Recommendations for follow up therapy are one component of a multi-disciplinary discharge planning process, led by the attending physician.  Recommendations may be updated based on patient status, additional functional criteria and insurance authorization.  Follow Up Recommendations  Home health PT     Assistance Recommended at Discharge PRN  Patient can return home with the following Assistance with cooking/housework;Assist for transportation;Help with stairs or ramp for entrance   Equipment Recommendations  Rollator (4 wheels);Other (comment) (tub bench)    Recommendations for Other Services       Precautions / Restrictions Precautions Precautions: Sternal;Fall Precaution Booklet Issued: No Restrictions Weight Bearing Restrictions: Yes Other Position/Activity Restrictions: sternal precautions     Mobility  Bed Mobility Overal bed mobility: Needs Assistance Bed Mobility: Sidelying to Sit   Sidelying to sit: HOB elevated, Min assist     Sit to sidelying: Min guard General bed mobility comments: Cues for log roll technique, assist with trunk to get to EOB.  Able to bring LEs back into bed.    Transfers Overall transfer level: Needs assistance Equipment used: None Transfers: Sit to/from Stand Sit to Stand: Supervision           General transfer comment: cues for sternal precautions, hands on knees as pt trying to push through arms on bed. Stood from [employer].    Ambulation/Gait Ambulation/Gait assistance: Min assist, Min guard Gait Distance (Feet): 120 Feet Assistive device: 1 person hand held assist, None Gait Pattern/deviations: Step-through pattern, Decreased stride length, Drifts right/left Gait velocity: decreased Gait velocity interpretation: <1.31 ft/sec, indicative of household ambulator   General Gait Details: Slow, unsteady gait initially with no UE support, digressed to needing UE support as pt reports feeling weak and reaching for rail/furniture for support and HHA.   Stairs             Wheelchair Mobility    Modified Rankin (Stroke Patients Only)       Balance Overall balance assessment: Needs assistance Sitting-balance support: Feet supported, No upper extremity supported Sitting balance-Leahy Scale: Good     Standing balance support: During functional activity Standing balance-Leahy Scale: Fair Standing balance comment: Able to stand statically without UE support, needs some support PRN today with walking due to sleepiness/weakness.                            Cognition Arousal/Alertness: Lethargic Behavior During Therapy: WFL for tasks assessed/performed Overall Cognitive Status: No family/caregiver present to determine baseline cognitive functioning  General Comments: Pt sleepy during session, needs constant reminders for sternal precautions during functional mobility.        Exercises Other Exercises Other Exercises: sit to stand x10 from EOB for functional strengthening    General Comments General comments (skin integrity, edema,  etc.): Sp02 remained in high 90s on 2L/min 02 [place], son present during session.      Pertinent Vitals/Pain Pain Assessment Pain Assessment: Faces Faces Pain Scale: Hurts little more Pain Location: head Pain Descriptors / Indicators: Headache Pain Intervention(s): Monitored during session, Repositioned    Home Living                          Prior Function            PT Goals (current goals can now be found in the care plan section) Progress towards PT goals: Progressing toward goals    Frequency    Min 3X/week      PT Plan Current plan remains appropriate    Co-evaluation              AM-PAC PT "6 Clicks" Mobility   Outcome Measure  Help needed turning from your back to your side while in a flat bed without using bedrails?: None Help needed moving from lying on your back to sitting on the side of a flat bed without using bedrails?: A Little Help needed moving to and from a bed to a chair (including a wheelchair)?: A Little Help needed standing up from a chair using your arms (e.g., wheelchair or bedside chair)?: A Little Help needed to walk in hospital room?: A Little Help needed climbing 3-5 steps with a railing? : A Little 6 Click Score: 19    End of Session Equipment Utilized During Treatment: Oxygen Activity Tolerance: Patient limited by lethargy;Patient limited by fatigue Patient left: in bed;with call bell/phone within reach;with bed alarm set;with family/visitor present Nurse Communication: Mobility status PT Visit Diagnosis: Unsteadiness on feet (R26.81);Difficulty in walking, not elsewhere classified (R26.2)     Time: 6440-3474 PT Time Calculation (min) (ACUTE ONLY): 20 min  Charges:  $Therapeutic Activity: 8-22 mins                     Marisa Severin, PT, DPT Acute Rehabilitation Services Secure chat preferred Office [phone]      Alice [name] 03/27/2022, 2:53 PM

## 2022-03-27 NOTE — Progress Notes (Signed)
Informed PA pt refused IV putting in. PA aware.   Lavenia Atlas, RN

## 2022-03-27 NOTE — Progress Notes (Addendum)
Pt sleeping, family member in room-dropped off materials (OHS book, heart healthy diet, and ex guidelines)  Alice [name] 03/27/2022 1:57 PM

## 2022-03-27 NOTE — Progress Notes (Signed)
Occupational Therapy Treatment Patient Details Name: Alice [name] MRN: [id number] DOB: 12/17/1930 Today's Date: 03/27/2022   History of present illness Pt is a 89 y.o. 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 Significant PMH: HLD, prior tobacco abuse, breast cancaer, hypothyroidism.   OT comments  Pt greeted impulsively mobilizing to EOB wanting to get to bathroom. Assisted pt with functional mobility to bathroom with MIN HHA. Pt needed MAX cues to complete pericare while maintaining sternal precautions. Pt impulsive throughout session dumping urine from hat into toilet. Pt completed standing grooming tasks with supervision. Pt also able to mobilize in hallway with Rw and Min guard assist, ~ 30 ft. Pt continues to present with impaired cognition, decreased ability to recall precautions and impulsivity. Pt would continue to benefit from skilled occupational therapy while admitted and after d/c to address the below listed limitations in order to improve overall functional mobility and facilitate independence with BADL participation. DC plan remains appropriate, will follow acutely per POC.     Recommendations for follow up therapy are one component of a multi-disciplinary discharge planning process, led by the attending physician.  Recommendations may be updated based on patient status, additional functional criteria and insurance authorization.    Follow Up Recommendations  Home health OT (if family able to arrange 24/7 assist, if unable will need SN)     Assistance Recommended at Discharge Frequent or constant Supervision/Assistance  Patient can return home with the following  A little help with walking and/or transfers;A lot of help with bathing/dressing/bathroom;Assistance with cooking/housework;Direct supervision/assist for medications management;Direct supervision/assist for financial management;Assist for  transportation;Help with stairs or ramp for entrance   Equipment Recommendations  Tub/shower bench;BSC/3in1    Recommendations for Other Services      Precautions / Restrictions Precautions Precautions: Sternal;Fall Precaution Booklet Issued: No Precaution Comments: max cues throughout session to recall precautions functionally Restrictions Weight Bearing Restrictions: Yes Other Position/Activity Restrictions: sternal precautions       Mobility Bed Mobility Overal bed mobility: Needs Assistance Bed Mobility: Sit to Sidelying         Sit to sidelying: Min assist, HOB elevated General bed mobility comments: MIN A to elevate BLEs back to bed    Transfers Overall transfer level: Needs assistance Equipment used: None Transfers: Sit to/from Stand Sit to Stand: Min guard           General transfer comment: min guard to rise from EOB/ toilet with min guard, MAX cues for hand placement 2/2 sternal precautions     Balance Overall balance assessment: Needs assistance Sitting-balance support: Feet supported, No upper extremity supported Sitting balance-Leahy Scale: Good     Standing balance support: During functional activity Standing balance-Leahy Scale: Fair Standing balance comment: static standing at sink for ADLs                           ADL either performed or assessed with clinical judgement   ADL Overall ADL's : Needs assistance/impaired     Grooming: Oral care;Sitting;Supervision/safety;Set up       Lower Body Bathing: Min guard;Sit to/from stand Lower Body Bathing Details (indicate cue type and reason): simulated via pericare         Toilet Transfer: Minimal assistance;Regular Toilet;Ambulation;Cueing for safety Toilet Transfer Details (indicate cue type and reason): cues to maintain sternal precautions, MIN HHA for functional ambulation Toileting- Clothing Manipulation and Hygiene:  Min guard;Sit to/from stand Toileting - Clothing  Manipulation Details (indicate cue type and reason): for pericare     Functional mobility during ADLs: Min guard;Rolling walker (2 wheels) General ADL Comments: ADL participation impacted by decreased recall of precautions, impaired safety awareness and impulsivity    Extremity/Trunk Assessment Upper Extremity Assessment Upper Extremity Assessment: Generalized weakness   Lower Extremity Assessment Lower Extremity Assessment: Defer to PT evaluation        Vision   Vision Assessment?: No apparent visual deficits   Perception     Praxis      Cognition Arousal/Alertness: Awake/alert Behavior During Therapy: WFL for tasks assessed/performed, Impulsive (annoyed) Overall Cognitive Status: Impaired/Different from baseline Area of Impairment: Safety/judgement, Memory                     Memory: Decreased recall of precautions   Safety/Judgement: Decreased awareness of safety, Decreased awareness of deficits     General Comments: decreased recall of precautions, impaired awareness to situation, needing cues for safety, impulsively emptying hat for urine, declining washing hands even though pt had just complete pericare        Exercises      Shoulder Instructions       General Comments pt on 2L during session, per nursing request pulse ox reading 85% however poor pleth noted    Pertinent Vitals/ Pain       Pain Assessment Pain Assessment: No/denies pain  Home Living                                          Prior Functioning/Environment              Frequency  Min 2X/week        Progress Toward Goals  OT Goals(current goals can now be found in the care plan section)  Progress towards OT goals: Progressing toward goals  Acute Rehab OT Goals Patient Stated Goal: to get warm OT Goal Formulation: With patient Time For Goal Achievement: 04/07/22 Potential to Achieve Goals: Good  Plan Discharge plan remains appropriate;Frequency  remains appropriate    Co-evaluation                 AM-PAC OT "6 Clicks" Daily Activity     Outcome Measure   Help from another person eating meals?: None Help from another person taking care of personal grooming?: A Little Help from another person toileting, which includes using toliet, bedpan, or urinal?: A Little Help from another person bathing (including washing, rinsing, drying)?: A Lot Help from another person to put on and taking off regular upper body clothing?: A Little Help from another person to put on and taking off regular lower body clothing?: A Little 6 Click Score: 18    End of Session Equipment Utilized During Treatment: Gait belt;Rolling walker (2 wheels);Oxygen;Other (comment) (2L [place])  OT Visit Diagnosis: Unsteadiness on feet (R26.81);Other abnormalities of gait and mobility (R26.89);Muscle weakness (generalized) (M62.81)   Activity Tolerance Patient tolerated treatment well   Patient Left in bed;with call bell/phone within reach;with bed alarm set   Nurse Communication Mobility status        Time: 7654-6503 OT Time Calculation (min): 18 min  Charges: OT General Charges $OT Visit: 1 Visit OT Treatments $Self Care/Home Management : 8-22 mins  Harley Alto., COTA/L Acute Rehabilitation Services [phone]   Precious Haws 03/27/2022,  4:19 PM

## 2022-03-27 NOTE — Progress Notes (Addendum)
Rounding Note    Patient Name: Alice [name] Date of Encounter: 03/27/2022  [place] Cardiologist: Freada Bergeron, MD   Subjective   Patient reports that her breathing continues to slowly improve since symptoms acutely worsened 2 days ago. She denies chest pain, orthopnea, palpitations.  Inpatient Medications    Scheduled Meds:  aspirin  324 mg Oral Daily   Chlorhexidine Gluconate Cloth  6 each Topical Daily   enoxaparin (LOVENOX) injection  30 mg Subcutaneous QHS   furosemide  40 mg Oral Daily   levothyroxine  112 mcg Per Tube q AM   lisinopril  10 mg Oral Daily   metoprolol tartrate  25 mg Oral BID   omega-3 acid ethyl esters  1 g Oral Daily   pantoprazole  40 mg Oral Daily   potassium chloride SA  30 mEq Oral BID   rosuvastatin  20 mg Oral Daily   sodium chloride flush  3 mL Intravenous Q12H   Continuous Infusions:  sodium chloride     PRN Meds: sodium chloride, alum & mag hydroxide-simeth, magnesium hydroxide, metoprolol tartrate, ondansetron (ZOFRAN) IV, mouth rinse, sodium chloride flush, traMADol   Vital Signs    Vitals:   03/26/22 1959 03/26/22 2324 03/27/22 0434 03/27/22 0733  BP: (!) 115/54 127/63 (!) 140/75 136/67  Pulse: 73 76 66 79  Resp: '18 19 16 15  '$ Temp: 97.6 F (36.4 C) 98 F (36.7 C) 98.1 F (36.7 C) 97.6 F (36.4 C)  TempSrc: Oral Oral Oral Oral  SpO2: 94% 96% 97% 96%  Weight:   57.9 kg   Height:        Intake/Output Summary (Last 24 hours) at 03/27/2022 0812 Last data filed at 03/27/2022 0522 Gross per 24 hour  Intake 400 ml  Output 950 ml  Net -550 ml      03/27/2022    4:34 AM 03/26/2022    5:00 AM 03/25/2022    5:00 AM  Last 3 Weights  Weight (lbs) 127 lb 9.6 oz 132 lb 4.4 oz 139 lb 5.3 oz  Weight (kg) 57.879 kg 60 kg 63.2 kg      Telemetry    Sinus rhythm is base rhythm. Approximately 1 min of atrial tachycardia vs 2:1 atrial flutter at around 12:30am. Isolated/brief bigeminy overnight as well. -  Personally Reviewed  ECG    No new tracing - Personally Reviewed  Physical Exam   GEN: No acute distress.   Neck: No JVD Cardiac: RRR, no murmurs, rubs, or gallops.  Respiratory: Clear to auscultation bilaterally though lower lobes are diminished bilaterally. GI: Soft, nontender, non-distended  MS: No edema; No deformity. Neuro:  Nonfocal  Psych: Normal affect   Labs    High Sensitivity Troponin:   Recent Labs  Lab 03/19/22 0628 03/19/22 0815  TROPONINIHS 116* 126*     Chemistry Recent Labs  Lab 03/20/22 1819 03/21/22 0424 03/23/22 0221 03/24/22 1559 03/26/22 0103 03/27/22 0650  NA 140   < > 136 138 137  --   K 3.9   < > 3.5 3.9 3.6  --   CL 110   < > 102 100 94*  --   CO2 23   < > 27 27 32  --   GLUCOSE 126*   < > 121* 132* 122*  --   BUN 14   < > 25* 22 18  --   CREATININE 0.81   < > 1.01* 0.88 0.91 0.76  CALCIUM 8.4*   < >  8.1* 8.2* 8.5*  --   MG 2.3  --   --   --  1.8  --   GFRNONAA >60   < > 53* >60 >60 >60  ANIONGAP 7   < > '7 11 11  '$ --    < > = values in this interval not displayed.    Lipids No results for input(s): "CHOL", "TRIG", "HDL", "LABVLDL", "LDLCALC", "CHOLHDL" in the last 168 hours.  Hematology Recent Labs  Lab 03/22/22 0558 03/23/22 0221 03/24/22 1559  WBC 12.3* 9.1 9.8  RBC 3.07* 2.90* 3.20*  HGB 9.0* 8.2* 9.1*  HCT 27.3* 26.2* 28.9*  MCV 88.9 90.3 90.3  MCH 29.3 28.3 28.4  MCHC 33.0 31.3 31.5  RDW 16.6* 16.1* 16.2*  PLT 116* 132* 202   Thyroid No results for input(s): "TSH", "FREET4" in the last 168 hours.  BNPNo results for input(s): "BNP", "PROBNP" in the last 168 hours.  DDimer No results for input(s): "DDIMER" in the last 168 hours.   Radiology    ECHOCARDIOGRAM LIMITED  Result Date: 03/26/2022    ECHOCARDIOGRAM LIMITED REPORT   Patient Name:   Alice [name] [facility] Date of Exam: 03/26/2022 Medical Rec #:  [id number]       Height:       62.0 in Accession #:    [id number]      Weight:       132.3 lb Date of Birth:  [birth year]         BSA:          1.604 m Patient Age:    89 years        BP:           155/68 mmHg Patient Gender: F               HR:           73 bpm. Exam Location:  Inpatient Procedure: Limited Echo, Color Doppler and Cardiac Doppler Indications:    I31.3 Pericardial effusion (noninflammatory)  History:        Patient has prior history of Echocardiogram examinations, most                 recent 03/19/2022. Prior CABG.  Sonographer:    Raquel Sarna Senior RDCS Referring Phys: [phone] Melaine Mcphee  Sonographer Comments: Limited for effusion and wall motion 7 days post CABG IMPRESSIONS  1. Left ventricular ejection fraction, by estimation, is 55 to 60%. The left ventricle has normal function. The left ventricle has no regional wall motion abnormalities. Left ventricular diastolic parameters are indeterminate.  2. Right ventricular systolic function is mildly reduced. The right ventricular size is normal. There is mildly elevated pulmonary artery systolic pressure. The estimated right ventricular systolic pressure is 85.4 mmHg.  3. The mitral valve is degenerative. Moderate mitral annular calcification. Mitral valve not fully assessed.  4. The aortic valve is tricuspid. There is moderate calcification of the aortic valve. Aortic valve not fully assessed.  5. The inferior vena cava is normal in size with <50% respiratory variability, suggesting right atrial pressure of 8 mmHg.  6. Limited echo for LV function. FINDINGS  Left Ventricle: Left ventricular ejection fraction, by estimation, is 55 to 60%. The left ventricle has normal function. The left ventricle has no regional wall motion abnormalities. Left ventricular diastolic parameters are indeterminate. Right Ventricle: The right ventricular size is normal. Right vetricular wall thickness was not well visualized. Right ventricular systolic function is mildly reduced. There is mildly elevated pulmonary  artery systolic pressure. The tricuspid regurgitant velocity is 2.86 m/s, and with an  assumed right atrial pressure of 8 mmHg, the estimated right ventricular systolic pressure is 67.6 mmHg. Mitral Valve: The mitral valve is degenerative in appearance. There is moderate thickening of the mitral valve leaflet(s). Moderate mitral annular calcification. Tricuspid Valve: The tricuspid valve is normal in structure. Tricuspid valve regurgitation is trivial. Aortic Valve: The aortic valve is tricuspid. There is moderate calcification of the aortic valve. Venous: The inferior vena cava is normal in size with less than 50% respiratory variability, suggesting right atrial pressure of 8 mmHg. Additional Comments: Spectral Doppler performed. Color Doppler performed.  AORTIC VALVE LVOT Vmax:   92.80 cm/s LVOT Vmean:  61.900 cm/s LVOT VTI:    0.185 m TRICUSPID VALVE TR Peak grad:   32.7 mmHg TR Vmax:        286.00 cm/s  SHUNTS Systemic VTI: 0.18 m [place] Electronically signed by Franki Monte Signature Date/Time: 03/26/2022/4:45:03 PM    Final    IR THORACENTESIS ASP PLEURAL SPACE W/IMG GUIDE  Result Date: 03/26/2022 INDICATION: Status post open heart surgery, left pleural effusion, shortness of breath EXAM: ULTRASOUND GUIDED LEFT THORACENTESIS MEDICATIONS: None. COMPLICATIONS: None immediate. PROCEDURE: An ultrasound guided thoracentesis was thoroughly discussed with the patient and questions answered. The benefits, risks, alternatives and complications were also discussed. The patient understands and wishes to proceed with the procedure. Written consent was obtained. Ultrasound was performed to localize and mark an adequate pocket of fluid in the left chest. The area was then prepped and draped in the normal sterile fashion. 1% Lidocaine was used for local anesthesia. Under ultrasound guidance a 6 Fr Safe-T-Centesis catheter was introduced. Thoracentesis was performed. The catheter was removed and a dressing applied. FINDINGS: A total of approximately 700cc of bloody fluid was removed. IMPRESSION:  Successful ultrasound guided left thoracentesis yielding 700cc of pleural fluid. Electronically Signed   By: Jerilynn Mages.  Shick M.D.   On: 03/26/2022 16:04   DG CHEST PORT 1 VIEW  Result Date: 03/26/2022 CLINICAL DATA:  Pleural effusion EXAM: PORTABLE CHEST 1 VIEW COMPARISON:  Chest x-ray 03/26/2022 FINDINGS: There is a small left pleural effusion which has decreased when compared to prior study. There is minimal atelectasis in the left lower lung. There is no pneumothorax. The cardiomediastinal silhouette is stable. Patient is status post cardiac surgery. Osseous structures are unchanged. IMPRESSION: Decreasing small left pleural effusion. Electronically Signed   By: Ronney Asters M.D.   On: 03/26/2022 15:51   DG Chest 2 View  Result Date: 03/26/2022 CLINICAL DATA:  Pleural effusion EXAM: CHEST - 2 VIEW COMPARISON:  CXR 03/25/22 FINDINGS: Postsurgical changes from median sternotomy and CABG. Persistent left-sided pleural effusion, unchanged from prior exam. No pneumothorax. Hazy opacity at the left lung base is favored to represent atelectasis. No new focal airspace opacity. Cardiac and mediastinal contours are unchanged from prior exam. Aortic atherosclerotic calcifications. No displaced rib fracture. Visualized upper abdomen is unremarkable. IMPRESSION: 1. Persistent left-sided pleural effusion, unchanged from prior exam. 2. Hazy opacity at the left lung base is favored to represent atelectasis. Electronically Signed   By: Marin Roberts M.D.   On: 03/26/2022 09:41   DG CHEST PORT 1 VIEW  Result Date: 03/25/2022 CLINICAL DATA:  Shortness of breath, chest pain. EXAM: PORTABLE CHEST 1 VIEW COMPARISON:  March 23, 2022. FINDINGS: Stable cardiomediastinal silhouette. Status post coronary bypass graft. Stable small left pleural effusion is noted with associated left basilar atelectasis or infiltrate. Minimal right  basilar subsegmental atelectasis is noted. Bony thorax is unremarkable. IMPRESSION: Stable small left  pleural effusion is noted with associated left basilar atelectasis or infiltrate. Electronically Signed   By: Marijo Conception M.D.   On: 03/25/2022 13:03    Cardiac Studies   03/19/22 TTE  IMPRESSIONS     1. Left ventricular ejection fraction, by estimation, is 60 to 65%. The  left ventricle has normal function. The left ventricle has no regional  wall motion abnormalities. Left ventricular diastolic parameters are  consistent with Grade I diastolic  dysfunction (impaired relaxation).   2. Right ventricular systolic function is normal. The right ventricular  size is normal. There is mildly elevated pulmonary artery systolic  pressure.   3. Left atrial size was mildly dilated.   4. Large calcium deposit on PMVL.Marland Kitchen The mitral valve is degenerative. Mild  mitral valve regurgitation. No evidence of mitral stenosis. Moderate  mitral annular calcification.   5. The aortic valve is normal in structure. Aortic valve regurgitation is  not visualized. Aortic valve sclerosis is present, with no evidence of  aortic valve stenosis. Aortic valve area, by VTI measures 1.44 cm. Aortic  valve mean gradient measures 8.0  mmHg. Aortic valve Vmax measures 1.81 m/s.   6. The inferior vena cava is dilated in size with >50% respiratory  variability, suggesting right atrial pressure of 8 mmHg.   FINDINGS   Left Ventricle: Left ventricular ejection fraction, by estimation, is 60  to 65%. The left ventricle has normal function. The left ventricle has no  regional wall motion abnormalities. The left ventricular internal cavity  size was normal in size. There is   no left ventricular hypertrophy. Left ventricular diastolic parameters  are consistent with Grade I diastolic dysfunction (impaired relaxation).  Indeterminate filling pressures.   Right Ventricle: The right ventricular size is normal. No increase in  right ventricular wall thickness. Right ventricular systolic function is  normal. There is  mildly elevated pulmonary artery systolic pressure. The  tricuspid regurgitant velocity is 2.71   m/s, and with an assumed right atrial pressure of 8 mmHg, the estimated  right ventricular systolic pressure is 67.5 mmHg.   Left Atrium: Left atrial size was mildly dilated.   Right Atrium: Right atrial size was normal in size.   Pericardium: There is no evidence of pericardial effusion.   Mitral Valve: Large calcium deposit on PMVL. The mitral valve is  degenerative in appearance. There is mild thickening of the mitral valve  leaflet(s). Moderate mitral annular calcification. Mild mitral valve  regurgitation. No evidence of mitral valve  stenosis. MV peak gradient, 7.6 mmHg. The mean mitral valve gradient is  4.0 mmHg.   Tricuspid Valve: The tricuspid valve is normal in structure. Tricuspid  valve regurgitation is mild . No evidence of tricuspid stenosis.   Aortic Valve: The aortic valve is normal in structure. Aortic valve  regurgitation is not visualized. Aortic valve sclerosis is present, with  no evidence of aortic valve stenosis. Aortic valve mean gradient measures  8.0 mmHg. Aortic valve peak gradient  measures 13.1 mmHg. Aortic valve area, by VTI measures 1.44 cm.   Pulmonic Valve: The pulmonic valve was normal in structure. Pulmonic valve  regurgitation is not visualized. No evidence of pulmonic stenosis.   Aorta: The aortic root and ascending aorta are structurally normal, with  no evidence of dilitation.   Venous: The inferior vena cava is dilated in size with greater than 50%  respiratory variability, suggesting right atrial pressure  of 8 mmHg.   IAS/Shunts: No atrial level shunt detected by color flow Doppler.   03/19/22 LHC    Mid LM to Dist LM lesion is 99% stenosed.   Prox RCA lesion is 90% stenosed.   Mid LAD lesion is 60% stenosed.  03/26/22 Limited TTE  IMPRESSIONS     1. Left ventricular ejection fraction, by estimation, is 55 to 60%. The  left  ventricle has normal function. The left ventricle has no regional  wall motion abnormalities. Left ventricular diastolic parameters are  indeterminate.   2. Right ventricular systolic function is mildly reduced. The right  ventricular size is normal. There is mildly elevated pulmonary artery  systolic pressure. The estimated right ventricular systolic pressure is  88.5 mmHg.   3. The mitral valve is degenerative. Moderate mitral annular  calcification. Mitral valve not fully assessed.   4. The aortic valve is tricuspid. There is moderate calcification of the  aortic valve. Aortic valve not fully assessed.   5. The inferior vena cava is normal in size with <50% respiratory  variability, suggesting right atrial pressure of 8 mmHg.   6. Limited echo for LV function.   FINDINGS   Left Ventricle: Left ventricular ejection fraction, by estimation, is 55  to 60%. The left ventricle has normal function. The left ventricle has no  regional wall motion abnormalities. Left ventricular diastolic parameters  are indeterminate.   Right Ventricle: The right ventricular size is normal. Right vetricular  wall thickness was not well visualized. Right ventricular systolic  function is mildly reduced. There is mildly elevated pulmonary artery  systolic pressure. The tricuspid regurgitant  velocity is 2.86 m/s, and with an assumed right atrial pressure of 8 mmHg,  the estimated right ventricular systolic pressure is 02.7 mmHg.   Mitral Valve: The mitral valve is degenerative in appearance. There is  moderate thickening of the mitral valve leaflet(s). Moderate mitral  annular calcification.   Tricuspid Valve: The tricuspid valve is normal in structure. Tricuspid  valve regurgitation is trivial.   Aortic Valve: The aortic valve is tricuspid. There is moderate  calcification of the aortic valve.   Venous: The inferior vena cava is normal in size with less than 50%  respiratory variability, suggesting  right atrial pressure of 8 mmHg.   Patient Profile     89 y.o. female with a hx of HLD, prior tobacco abuse, hypothyroidism and history of breast cancer was admitted on 03/19/2022 for NSTEMI. Subsequent cardiac cath revealed critical left main lesion, patient underwent urgent CABG x2 (LIMA-LAD, SVG-OM) on 1/22.      Assessment & Plan    CAD with critical left main disease S/P CABG (LIMA-LAD and SVG-OM) NSTEMI  Patient admitted with chest pain, troponin 116->126, and new ST depressions in lateral leads. LHC with 99% LM stenosis prompting IABP placement and emergent CABG.   Repeat limited echo shows preserved LVEF, no wall motion abnormalities, trivial pericardial effusion.  Continue statin, ASA Increase Metoprolol Tartrate to '50mg'$  BID. Could consolidate at discharge. Initiate Plavix at discharge  Dyspnea Pleural effusions  Patient with post-operative dyspnea worsening on 1/28. JVP noted to be elevated along with some bilateral lower extremity edema. CXR noted small stable left pleural effusion. PO Lasix changed to IV lasix with good urine output (~3L). Patient also underwent ultrasound guided thoracentesis on 1/29 with ~779m bloody fluid removed. Now back on PO lasix.  CXR this morning with stable appearing effusions, left>right Patient appears euvolemic on physical exam, no JVP.  Continue Lasix '40mg'$  PO this morning. May be able to decrease or stop tomorrow as respiratory status continues to improve.   Tachyarrhythmia  Patient noted with an isolated episode of tachycardia on 1/29. Recurrent episode of this lasting 5mn overnight last night. Also with brief episode of bigeminy. Remains in a stable sinus rhythm on Metoprolol.   Given that these episodes are very brief, would not plan to initiate Amiodarone at this time. If needed, would start directly with PO, no IV. Increase Metoprolol Tartrate to '50mg'$  BID.      For questions or updates, please contact [facility]Please  consult [email or web address] for contact info under        Signed, ELily Kocher PA-C  03/27/2022, 8:12 AM     I have seen and examined the patient along with ELily Kocher PA-C.   I have reviewed the chart, notes and new data.  I agree with PA/NP's note.  Key new complaints: Feels that her breathing has improved. Key examination changes: Slight improvement in aeration of basal right lung on exam.  Remains in normal rhythm.  No other signs of hypervolemia.  Weight is substantially low today, close to her preoperative weight. Key new findings / data:: Telemetry shows mostly sinus rhythm, there are occasional brief bursts of narrow complex tachycardia, consistently asymptomatic.  Echo shows normal left ventricular systolic function and overall cardiac output.  There is a trivial pericardial effusion that is not hemodynamically important.  Right atrial pressure and pulmonary artery pressure are both mildly elevated.   PLAN: Overall burden of atrial tachyarrhythmia is quite low and does not appear to justify amiodarone.  Will increase the beta-blocker dose. Continue oral diuretics.  MSanda Klein MD, FFair[phone]/30/2024, 9:36 AM

## 2022-03-28 DIAGNOSIS — I214 Non-ST elevation (NSTEMI) myocardial infarction: Secondary | ICD-10-CM | POA: Diagnosis not present

## 2022-03-28 MED ORDER — FUROSEMIDE 40 MG PO TABS
40.0000 mg | ORAL_TABLET | Freq: Every day | ORAL | Status: DC
  Administered 2022-03-28 – 2022-04-03 (×7): 40 mg via ORAL
  Filled 2022-03-28 (×8): qty 1

## 2022-03-28 MED ORDER — LISINOPRIL 20 MG PO TABS: 20.0000 mg | ORAL_TABLET | Freq: Every day | ORAL | Status: DC

## 2022-03-28 MED ORDER — WHITE PETROLATUM EX OINT
TOPICAL_OINTMENT | CUTANEOUS | Status: DC | PRN
  Filled 2022-03-28 (×3): qty 28.35

## 2022-03-28 MED ORDER — MENTHOL 3 MG MT LOZG
1.0000 | LOZENGE | OROMUCOSAL | Status: DC | PRN
  Filled 2022-03-28: qty 9

## 2022-03-28 NOTE — Progress Notes (Addendum)
Rounding Note    Patient Name: Alice [name] Date of Encounter: 03/28/2022  [place] Cardiologist: Freada Bergeron, MD   Subjective   Patient reports that she did not sleep well last night. She has ambulated both independently and with physical therapy, reports moderate dyspnea with both. Denies chest pain, palpitations, lightheadedness/dizziness.  Inpatient Medications    Scheduled Meds:  aspirin  324 mg Oral Daily   Chlorhexidine Gluconate Cloth  6 each Topical Daily   enoxaparin (LOVENOX) injection  30 mg Subcutaneous QHS   furosemide  40 mg Oral Daily   levothyroxine  112 mcg Per Tube q AM   lisinopril  20 mg Oral Daily   metoprolol tartrate  50 mg Oral BID   omega-3 acid ethyl esters  1 g Oral Daily   pantoprazole  40 mg Oral Daily   potassium chloride SA  30 mEq Oral BID   rosuvastatin  20 mg Oral Daily   sodium chloride flush  3 mL Intravenous Q12H   Continuous Infusions:  sodium chloride     PRN Meds: sodium chloride, alum & mag hydroxide-simeth, magnesium hydroxide, menthol-cetylpyridinium, metoprolol tartrate, ondansetron (ZOFRAN) IV, mouth rinse, sodium chloride flush, traMADol, white petrolatum   Vital Signs    Vitals:   03/27/22 2307 03/28/22 0330 03/28/22 0448 03/28/22 0818  BP:  (!) 152/98  (!) 84/69  Pulse:  72  76  Resp: '20 18  17  '$ Temp:  98.6 F (37 C)  98 F (36.7 C)  TempSrc:  Oral  Oral  SpO2: 94% 100%  100%  Weight:   58.2 kg   Height:        Intake/Output Summary (Last 24 hours) at 03/28/2022 0907 Last data filed at 03/28/2022 0819 Gross per 24 hour  Intake 430 ml  Output 750 ml  Net -320 ml      03/28/2022    4:48 AM 03/27/2022    4:34 AM 03/26/2022    5:00 AM  Last 3 Weights  Weight (lbs) 128 lb 4.9 oz 127 lb 9.6 oz 132 lb 4.4 oz  Weight (kg) 58.2 kg 57.879 kg 60 kg      Telemetry    Sinus rhythm with intermittent PVCs - Personally Reviewed  ECG    No new tracing - Personally Reviewed  Physical Exam    GEN: No acute distress.   Neck: No JVD Cardiac: RRR, no murmurs, rubs, or gallops.  Respiratory: Bilateral lower lobes diminished GI: Soft, nontender, non-distended  MS: 1-2+ bilateral lower extremity edema Neuro:  Nonfocal  Psych: Normal affect   Labs    High Sensitivity Troponin:   Recent Labs  Lab 03/19/22 0628 03/19/22 0815  TROPONINIHS 116* 126*     Chemistry Recent Labs  Lab 03/24/22 1559 03/26/22 0103 03/27/22 0650  NA 138 137 139  K 3.9 3.6 3.7  CL 100 94* 92*  CO2 27 32 37*  GLUCOSE 132* 122* 110*  BUN '22 18 18  '$ CREATININE 0.88 0.91 0.82  0.76  CALCIUM 8.2* 8.5* 8.6*  MG  --  1.8  --   GFRNONAA >60 >60 >60  >60  ANIONGAP '11 11 10    '$ Lipids No results for input(s): "CHOL", "TRIG", "HDL", "LABVLDL", "LDLCALC", "CHOLHDL" in the last 168 hours.  Hematology Recent Labs  Lab 03/22/22 0558 03/23/22 0221 03/24/22 1559  WBC 12.3* 9.1 9.8  RBC 3.07* 2.90* 3.20*  HGB 9.0* 8.2* 9.1*  HCT 27.3* 26.2* 28.9*  MCV 88.9 90.3 90.3  MCH 29.3 28.3 28.4  MCHC 33.0 31.3 31.5  RDW 16.6* 16.1* 16.2*  PLT 116* 132* 202   Thyroid No results for input(s): "TSH", "FREET4" in the last 168 hours.  BNPNo results for input(s): "BNP", "PROBNP" in the last 168 hours.  DDimer No results for input(s): "DDIMER" in the last 168 hours.   Radiology    DG Chest 2 View  Result Date: 03/27/2022 CLINICAL DATA:  Pleural effusion per order. EXAM: CHEST - 2 VIEW COMPARISON:  03/26/2022 FINDINGS: Small bilateral pleural effusions left greater than right. Patchy airspace opacities at the lung bases left greater than right largely stable. Mild central pulmonary vascular congestion probably partially improved. Heart size upper limits normal. Aortic Atherosclerosis (ICD10-170.0). CABG graft marker. No pneumothorax. Sternotomy wires. IMPRESSION: Persistent small effusions and bibasilar airspace disease, left greater than right. Electronically Signed   By: Lucrezia Europe M.D.   On: 03/27/2022 08:10    ECHOCARDIOGRAM LIMITED  Result Date: 03/26/2022    ECHOCARDIOGRAM LIMITED REPORT   Patient Name:   Alice [name] [facility] Date of Exam: 03/26/2022 Medical Rec #:  [id number]       Height:       62.0 in Accession #:    [id number]      Weight:       132.3 lb Date of Birth:  07-Jan-1931        BSA:          1.604 m Patient Age:    89 years        BP:           155/68 mmHg Patient Gender: F               HR:           73 bpm. Exam Location:  Inpatient Procedure: Limited Echo, Color Doppler and Cardiac Doppler Indications:    I31.3 Pericardial effusion (noninflammatory)  History:        Patient has prior history of Echocardiogram examinations, most                 recent 03/19/2022. Prior CABG.  Sonographer:    Raquel Sarna Senior RDCS Referring Phys: [phone] Cyndie Woodbeck  Sonographer Comments: Limited for effusion and wall motion 7 days post CABG IMPRESSIONS  1. Left ventricular ejection fraction, by estimation, is 55 to 60%. The left ventricle has normal function. The left ventricle has no regional wall motion abnormalities. Left ventricular diastolic parameters are indeterminate.  2. Right ventricular systolic function is mildly reduced. The right ventricular size is normal. There is mildly elevated pulmonary artery systolic pressure. The estimated right ventricular systolic pressure is 19.6 mmHg.  3. The mitral valve is degenerative. Moderate mitral annular calcification. Mitral valve not fully assessed.  4. The aortic valve is tricuspid. There is moderate calcification of the aortic valve. Aortic valve not fully assessed.  5. The inferior vena cava is normal in size with <50% respiratory variability, suggesting right atrial pressure of 8 mmHg.  6. Limited echo for LV function. FINDINGS  Left Ventricle: Left ventricular ejection fraction, by estimation, is 55 to 60%. The left ventricle has normal function. The left ventricle has no regional wall motion abnormalities. Left ventricular diastolic parameters are indeterminate. Right  Ventricle: The right ventricular size is normal. Right vetricular wall thickness was not well visualized. Right ventricular systolic function is mildly reduced. There is mildly elevated pulmonary artery systolic pressure. The tricuspid regurgitant velocity is 2.86 m/s, and with an assumed right atrial pressure  of 8 mmHg, the estimated right ventricular systolic pressure is 09.6 mmHg. Mitral Valve: The mitral valve is degenerative in appearance. There is moderate thickening of the mitral valve leaflet(s). Moderate mitral annular calcification. Tricuspid Valve: The tricuspid valve is normal in structure. Tricuspid valve regurgitation is trivial. Aortic Valve: The aortic valve is tricuspid. There is moderate calcification of the aortic valve. Venous: The inferior vena cava is normal in size with less than 50% respiratory variability, suggesting right atrial pressure of 8 mmHg. Additional Comments: Spectral Doppler performed. Color Doppler performed.  AORTIC VALVE LVOT Vmax:   92.80 cm/s LVOT Vmean:  61.900 cm/s LVOT VTI:    0.185 m TRICUSPID VALVE TR Peak grad:   32.7 mmHg TR Vmax:        286.00 cm/s  SHUNTS Systemic VTI: 0.18 m Remer Electronically signed by Franki Monte Signature Date/Time: 03/26/2022/4:45:03 PM    Final    IR THORACENTESIS ASP PLEURAL SPACE W/IMG GUIDE  Result Date: 03/26/2022 INDICATION: Status post open heart surgery, left pleural effusion, shortness of breath EXAM: ULTRASOUND GUIDED LEFT THORACENTESIS MEDICATIONS: None. COMPLICATIONS: None immediate. PROCEDURE: An ultrasound guided thoracentesis was thoroughly discussed with the patient and questions answered. The benefits, risks, alternatives and complications were also discussed. The patient understands and wishes to proceed with the procedure. Written consent was obtained. Ultrasound was performed to localize and mark an adequate pocket of fluid in the left chest. The area was then prepped and draped in the normal sterile  fashion. 1% Lidocaine was used for local anesthesia. Under ultrasound guidance a 6 Fr Safe-T-Centesis catheter was introduced. Thoracentesis was performed. The catheter was removed and a dressing applied. FINDINGS: A total of approximately 700cc of bloody fluid was removed. IMPRESSION: Successful ultrasound guided left thoracentesis yielding 700cc of pleural fluid. Electronically Signed   By: Jerilynn Mages.  Shick M.D.   On: 03/26/2022 16:04   DG CHEST PORT 1 VIEW  Result Date: 03/26/2022 CLINICAL DATA:  Pleural effusion EXAM: PORTABLE CHEST 1 VIEW COMPARISON:  Chest x-ray 03/26/2022 FINDINGS: There is a small left pleural effusion which has decreased when compared to prior study. There is minimal atelectasis in the left lower lung. There is no pneumothorax. The cardiomediastinal silhouette is stable. Patient is status post cardiac surgery. Osseous structures are unchanged. IMPRESSION: Decreasing small left pleural effusion. Electronically Signed   By: Ronney Asters M.D.   On: 03/26/2022 15:51   DG Chest 2 View  Result Date: 03/26/2022 CLINICAL DATA:  Pleural effusion EXAM: CHEST - 2 VIEW COMPARISON:  CXR 03/25/22 FINDINGS: Postsurgical changes from median sternotomy and CABG. Persistent left-sided pleural effusion, unchanged from prior exam. No pneumothorax. Hazy opacity at the left lung base is favored to represent atelectasis. No new focal airspace opacity. Cardiac and mediastinal contours are unchanged from prior exam. Aortic atherosclerotic calcifications. No displaced rib fracture. Visualized upper abdomen is unremarkable. IMPRESSION: 1. Persistent left-sided pleural effusion, unchanged from prior exam. 2. Hazy opacity at the left lung base is favored to represent atelectasis. Electronically Signed   By: Marin Roberts M.D.   On: 03/26/2022 09:41    Cardiac Studies   03/19/22 TTE   IMPRESSIONS     1. Left ventricular ejection fraction, by estimation, is 60 to 65%. The  left ventricle has normal function.  The left ventricle has no regional  wall motion abnormalities. Left ventricular diastolic parameters are  consistent with Grade I diastolic  dysfunction (impaired relaxation).   2. Right ventricular systolic function is normal. The right ventricular  size is normal. There is mildly elevated pulmonary artery systolic  pressure.   3. Left atrial size was mildly dilated.   4. Large calcium deposit on PMVL.Marland Kitchen The mitral valve is degenerative. Mild  mitral valve regurgitation. No evidence of mitral stenosis. Moderate  mitral annular calcification.   5. The aortic valve is normal in structure. Aortic valve regurgitation is  not visualized. Aortic valve sclerosis is present, with no evidence of  aortic valve stenosis. Aortic valve area, by VTI measures 1.44 cm. Aortic  valve mean gradient measures 8.0  mmHg. Aortic valve Vmax measures 1.81 m/s.   6. The inferior vena cava is dilated in size with >50% respiratory  variability, suggesting right atrial pressure of 8 mmHg.   FINDINGS   Left Ventricle: Left ventricular ejection fraction, by estimation, is 60  to 65%. The left ventricle has normal function. The left ventricle has no  regional wall motion abnormalities. The left ventricular internal cavity  size was normal in size. There is   no left ventricular hypertrophy. Left ventricular diastolic parameters  are consistent with Grade I diastolic dysfunction (impaired relaxation).  Indeterminate filling pressures.   Right Ventricle: The right ventricular size is normal. No increase in  right ventricular wall thickness. Right ventricular systolic function is  normal. There is mildly elevated pulmonary artery systolic pressure. The  tricuspid regurgitant velocity is 2.71   m/s, and with an assumed right atrial pressure of 8 mmHg, the estimated  right ventricular systolic pressure is 40.3 mmHg.   Left Atrium: Left atrial size was mildly dilated.   Right Atrium: Right atrial size was normal in  size.   Pericardium: There is no evidence of pericardial effusion.   Mitral Valve: Large calcium deposit on PMVL. The mitral valve is  degenerative in appearance. There is mild thickening of the mitral valve  leaflet(s). Moderate mitral annular calcification. Mild mitral valve  regurgitation. No evidence of mitral valve  stenosis. MV peak gradient, 7.6 mmHg. The mean mitral valve gradient is  4.0 mmHg.   Tricuspid Valve: The tricuspid valve is normal in structure. Tricuspid  valve regurgitation is mild . No evidence of tricuspid stenosis.   Aortic Valve: The aortic valve is normal in structure. Aortic valve  regurgitation is not visualized. Aortic valve sclerosis is present, with  no evidence of aortic valve stenosis. Aortic valve mean gradient measures  8.0 mmHg. Aortic valve peak gradient  measures 13.1 mmHg. Aortic valve area, by VTI measures 1.44 cm.   Pulmonic Valve: The pulmonic valve was normal in structure. Pulmonic valve  regurgitation is not visualized. No evidence of pulmonic stenosis.   Aorta: The aortic root and ascending aorta are structurally normal, with  no evidence of dilitation.   Venous: The inferior vena cava is dilated in size with greater than 50%  respiratory variability, suggesting right atrial pressure of 8 mmHg.   IAS/Shunts: No atrial level shunt detected by color flow Doppler.    03/19/22 LHC     Mid LM to Dist LM lesion is 99% stenosed.   Prox RCA lesion is 90% stenosed.   Mid LAD lesion is 60% stenosed.   03/26/22 Limited TTE   IMPRESSIONS     1. Left ventricular ejection fraction, by estimation, is 55 to 60%. The  left ventricle has normal function. The left ventricle has no regional  wall motion abnormalities. Left ventricular diastolic parameters are  indeterminate.   2. Right ventricular systolic function is mildly reduced. The right  ventricular size  is normal. There is mildly elevated pulmonary artery  systolic pressure. The  estimated right ventricular systolic pressure is  32.9 mmHg.   3. The mitral valve is degenerative. Moderate mitral annular  calcification. Mitral valve not fully assessed.   4. The aortic valve is tricuspid. There is moderate calcification of the  aortic valve. Aortic valve not fully assessed.   5. The inferior vena cava is normal in size with <50% respiratory  variability, suggesting right atrial pressure of 8 mmHg.   6. Limited echo for LV function.   FINDINGS   Left Ventricle: Left ventricular ejection fraction, by estimation, is 55  to 60%. The left ventricle has normal function. The left ventricle has no  regional wall motion abnormalities. Left ventricular diastolic parameters  are indeterminate.   Right Ventricle: The right ventricular size is normal. Right vetricular  wall thickness was not well visualized. Right ventricular systolic  function is mildly reduced. There is mildly elevated pulmonary artery  systolic pressure. The tricuspid regurgitant  velocity is 2.86 m/s, and with an assumed right atrial pressure of 8 mmHg,  the estimated right ventricular systolic pressure is 92.4 mmHg.   Mitral Valve: The mitral valve is degenerative in appearance. There is  moderate thickening of the mitral valve leaflet(s). Moderate mitral  annular calcification.   Tricuspid Valve: The tricuspid valve is normal in structure. Tricuspid  valve regurgitation is trivial.   Aortic Valve: The aortic valve is tricuspid. There is moderate  calcification of the aortic valve.   Venous: The inferior vena cava is normal in size with less than 50%  respiratory variability, suggesting right atrial pressure of 8 mmHg.   Patient Profile     89 y.o. female with a hx of HLD, prior tobacco abuse, hypothyroidism and history of breast cancer was admitted on 03/19/2022 for NSTEMI. Subsequent cardiac cath revealed critical left main lesion, patient underwent urgent CABG x2 (LIMA-LAD, SVG-OM) on 1/22.       Assessment & Plan    CAD with critical left main disease S/P CABG (LIMA-LAD and SVG-OM) NSTEMI   Patient admitted with chest pain, troponin 116->126, and new ST depressions in lateral leads. LHC with 99% LM stenosis prompting IABP placement and emergent CABG.    Repeat limited echo shows preserved LVEF, no wall motion abnormalities, trivial pericardial effusion.  Continue statin, ASA Continue Metoprolol Tartrate to '50mg'$  BID. Could consolidate at discharge. Initiate Plavix at discharge Lisinopril held this AM due to low BP. I'm not sure that readings have been accurate due to wrong cuff size, can resume if BP proves to be stable. Would recommend '10mg'$  dose rather than '20mg'$  unless patient is very hypertensive. Given tachyarrhythmia, preference would be increasing Metoprolol rather than lisinopril.    Dyspnea Pleural effusions   Patient with post-operative dyspnea worsening on 1/28. JVP noted to be elevated along with some bilateral lower extremity edema. CXR noted small stable left pleural effusion. PO Lasix changed to IV lasix with good urine output (~3L). Patient also underwent ultrasound guided thoracentesis on 1/29 with ~745m bloody fluid removed. Now back on PO lasix.   CXR yesterday with stable appearing effusions, left>right Patient appears near-euvolemic on physical exam, no JVP. Mild lower extremity edema.  Continue Lasix '40mg'$  PO this morning. May be able to decrease or stop as respiratory status continues to improve.    Tachyarrhythmia   Patient noted with an isolated episode of tachycardia on 1/29. Recurrent episode of this lasting 171m overnight 1/29-1/30. Also with brief episode  of bigeminy. Since increasing metoprolol, no recurrent tachycardia. Remains in a stable sinus rhythm.    Continue increased dose Metoprolol Tartrate to '50mg'$  BID.        For questions or updates, please contact [place] Please consult [email or web address] for contact info under         Signed, Lily Kocher, PA-C  03/28/2022, 9:07 AM    I have seen and examined the patient along with Lily Kocher, PA-C .  I have reviewed the chart, notes and new data.  I agree with PA/NP's note.  Key new complaints: describes her breathing as "labored" but she looks a lot more comfortable today than yesterday.  She is able to speak in lengthy sentences without stopping to catch her breath.  Walked in hall for 200 feet without difficulty. Key examination changes: Diminished breath sounds and dullness to percussion in the left base.  Mild symmetrical ankle edema.  Weight unchanged from yesterday. Key new findings / data: Telemetry does not show any new episodes of atrial tachycardia. Normal LVEF on echo performed yesterday.  PLAN: Appears just about ready for discharge, especially if she will be going to an inpatient rehab facility. Cardiology follow-up is already scheduled with Christen Bame, NP on 04/09/2022. For HBP, prefer metoprolol over lisinopril for added benefit for her occasional episodes of atrial tachyarrhythmia.    Sanda Klein, MD, [place] [phone] 03/28/2022, 10:42 AM

## 2022-03-28 NOTE — Progress Notes (Signed)
Occupational Therapy Treatment Patient Details Name: Alice [name] MRN: [id number] DOB: 1930-03-30 Today's Date: 03/28/2022   History of present illness Pt is a 89 y.o. 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 Significant PMH: HLD, prior tobacco abuse, breast cancaer, hypothyroidism.   OT comments  Pt progressing towards goals, increased awareness of sternal precautions, remembers she cannot "bring her elbows out". Pt performing UB and LB dressing with min-mod A, min A for toilet transfer/pericare. Pt needing min guard for bed mobility, min-mod cues throughout session to adhere to sternal precautions. Son present during session, expresses concern with pt returning home and wants her to get "a little stronger". Pt presenting with impairments listed below, will follow acutely. Updating d/c recommendation to SNF.    Recommendations for follow up therapy are one component of a multi-disciplinary discharge planning process, led by the attending physician.  Recommendations may be updated based on patient status, additional functional criteria and insurance authorization.    Follow Up Recommendations  Skilled nursing-short term rehab (<3 hours/day)     Assistance Recommended at Discharge Frequent or constant Supervision/Assistance  Patient can return home with the following  A little help with walking and/or transfers;A lot of help with bathing/dressing/bathroom;Assistance with cooking/housework;Direct supervision/assist for medications management;Direct supervision/assist for financial management;Assist for transportation;Help with stairs or ramp for entrance   Equipment Recommendations  Tub/shower bench;BSC/3in1    Recommendations for Other Services PT consult    Precautions / Restrictions Precautions Precautions: Sternal;Fall Precaution Booklet Issued: No Restrictions Weight Bearing Restrictions: No Other  Position/Activity Restrictions: sternal precautions       Mobility Bed Mobility Overal bed mobility: Needs Assistance           Sit to sidelying: Min guard General bed mobility comments: min cues for precautions    Transfers Overall transfer level: Needs assistance Equipment used: None Transfers: Sit to/from Stand Sit to Stand: Min guard           General transfer comment: min guard to rise from EOB/ toilet with min guard, MAX cues for hand placement 2/2 sternal precautions     Balance Overall balance assessment: Needs assistance Sitting-balance support: Feet supported, No upper extremity supported Sitting balance-Leahy Scale: Good     Standing balance support: During functional activity Standing balance-Leahy Scale: Fair Standing balance comment: static standing at sink for ADLs                           ADL either performed or assessed with clinical judgement   ADL Overall ADL's : Needs assistance/impaired                 Upper Body Dressing : Sitting;Minimal assistance;Moderate assistance Upper Body Dressing Details (indicate cue type and reason): donning shirt Lower Body Dressing: Minimal assistance;Moderate assistance;Sitting/lateral leans Lower Body Dressing Details (indicate cue type and reason): donning/doffing sock Toilet Transfer: Supervision/safety;Ambulation;Regular Museum/gallery exhibitions officer and Hygiene: Sit to/from stand;Minimal assistance Toileting - Clothing Manipulation Details (indicate cue type and reason): for pericare     Functional mobility during ADLs: Supervision/safety      Extremity/Trunk Assessment Upper Extremity Assessment Upper Extremity Assessment: Generalized weakness   Lower Extremity Assessment Lower Extremity Assessment: Defer to PT evaluation        Vision   Vision Assessment?: No apparent visual deficits   Perception Perception Perception: Not tested   Praxis Praxis Praxis:  Not tested    Cognition Arousal/Alertness: Awake/alert Behavior During Therapy: WFL for tasks assessed/performed, Impulsive Overall Cognitive Status: Impaired/Different from baseline Area of Impairment: Safety/judgement, Memory                     Memory: Decreased recall of precautions   Safety/Judgement: Decreased awareness of safety, Decreased awareness of deficits     General Comments: needs mod cues for adherence to sternal precautions        Exercises      Shoulder Instructions       General Comments VSS on RA, pt's son present    Pertinent Vitals/ Pain       Pain Assessment Pain Assessment: No/denies pain  Home Living                                          Prior Functioning/Environment              Frequency  Min 2X/week        Progress Toward Goals  OT Goals(current goals can now be found in the care plan section)  Progress towards OT goals: Progressing toward goals  Acute Rehab OT Goals Patient Stated Goal: none stated OT Goal Formulation: With patient Time For Goal Achievement: 04/07/22 Potential to Achieve Goals: Good ADL Goals Pt Will Perform Upper Body Dressing: with supervision;sitting Pt Will Perform Lower Body Dressing: with supervision;sit to/from stand;sitting/lateral leans Pt Will Transfer to Toilet: with supervision;ambulating;regular height toilet Pt Will Perform Tub/Shower Transfer: Tub transfer;Shower transfer;ambulating;with supervision Additional ADL Goal #1: Pt will perform 2 ADL tasks with min cues to adhere to sternal precautions.  Plan Discharge plan needs to be updated;Frequency remains appropriate    Co-evaluation                 AM-PAC OT "6 Clicks" Daily Activity     Outcome Measure   Help from another person eating meals?: None Help from another person taking care of personal grooming?: A Little Help from another person toileting, which includes using toliet, bedpan, or  urinal?: A Little Help from another person bathing (including washing, rinsing, drying)?: A Lot Help from another person to put on and taking off regular upper body clothing?: A Little Help from another person to put on and taking off regular lower body clothing?: A Little 6 Click Score: 18    End of Session Equipment Utilized During Treatment: Gait belt  OT Visit Diagnosis: Unsteadiness on feet (R26.81);Other abnormalities of gait and mobility (R26.89);Muscle weakness (generalized) (M62.81)   Activity Tolerance Patient tolerated treatment well   Patient Left in bed;with call bell/phone within reach;with bed alarm set;with family/visitor present   Nurse Communication Mobility status        Time: 9381-0175 OT Time Calculation (min): 24 min  Charges: OT General Charges $OT Visit: 1 Visit OT Treatments $Self Care/Home Management : 23-37 mins  Renaye Rakers, OTD, OTR/L SecureChat Preferred Acute Rehab [phone] - [place] 03/28/2022, 1:31 PM

## 2022-03-28 NOTE — Progress Notes (Signed)
Mobility Specialist Progress Note:   03/28/22 1002  Mobility  Activity Ambulated with assistance in hallway  Level of Assistance Standby assist, set-up cues, supervision of patient - no hands on  Assistive Device None  Distance Ambulated (ft) 200 ft  Activity Response Tolerated well  $Mobility charge 1 Mobility   Pt EOB willing to participate in mobility. No complaints of pain. Left in EOB with call bell in reach and all needs met.   Gareth Eagle Hansford Hirt Mobility Specialist Please contact via [employer] or  Rehab Office at [phone]

## 2022-03-28 NOTE — Progress Notes (Addendum)
CARDIAC REHAB PHASE I   PRE:  Rate/Rhythm: 65 SR    BP: sitting 119/62    SaO2: 90 RA  MODE:  Ambulation: 150 ft   POST:  Rate/Rhythm: 71 SR    BP: sitting 137/63     SaO2: 93 RA  Pt stood and walked with contact guard with RW. Steady but fatigue with distance. Rested at nursing station. SpO2 difficult to register but ultimately 93 RA. Pt c/o "tired lungs". To bed for nap. Will place referral for [place] [facility]. [phone]   Yves Dill BS, ACSM-CEP 03/28/2022 1:55 PM

## 2022-03-28 NOTE — Progress Notes (Signed)
Pt very restless moving from bed to chair and back, constantly triggers bed alarm and chair alarms. She removed her tele box and currently refusing to to be paced on at this time. Primary RN aware.

## 2022-03-28 NOTE — Progress Notes (Addendum)
[address]       [place] [address]             [phone]        9 Days Post-Op Procedure(s) (LRB): CORONARY ARTERY BYPASS GRAFTING (CABG) X TWO, USING LEFT INTERNAL MAMMARY ARTERY AND RIGHT LEG GREATER SAPHENOUS VEIN HARVESTED ENDOSCOPICALLY (N/A) TRANSESOPHAGEAL ECHOCARDIOGRAM (N/A)  Subjective: Patient sitting on edge of bed. Did not sleep last night. She states "I feel terrible and I feel like I could die". She denies abdominal pain, had bowel movement yesterday.  Objective: Vital signs in last 24 hours: Temp:  [97.5 F (36.4 C)-98.6 F (37 C)] 98.6 F (37 C) (01/31 0330) Pulse Rate:  [68-79] 72 (01/31 0330) Cardiac Rhythm: Normal sinus rhythm (01/30 1930) Resp:  [15-20] 18 (01/31 0330) BP: (103-152)/(51-98) 152/98 (01/31 0330) SpO2:  [94 %-100 %] 100 % (01/31 0330) Weight:  [58.2 kg] 58.2 kg (01/31 0448)  Pre op weight  54.4 kg Current Weight  03/28/22 58.2 kg      Intake/Output from previous day: 01/30 0701 - 01/31 0700 In: 290 [P.O.:290] Out: 750 [Urine:750]   Physical Exam:  Cardiovascular: RRR Pulmonary: Slightly diminished at bases Abdomen: Soft, non tender, bowel sounds present. Extremities: Mild bilateral lower extremity edema R>L Wounds: Clean and dry.  No erythema or signs of infection.  Lab Results: CBC: No results for input(s): "WBC", "HGB", "HCT", "PLT" in the last 72 hours.  BMET:  Recent Labs    03/26/22 0103 03/27/22 0650  NA 137 139  K 3.6 3.7  CL 94* 92*  CO2 32 37*  GLUCOSE 122* 110*  BUN 18 18  CREATININE 0.91 0.82  0.76  CALCIUM 8.5* 8.6*     PT/INR:  Lab Results  Component Value Date   INR 1.5 (H) 03/19/2022   INR 0.94 09/10/2014   ABG:  INR: Will add last result for INR, ABG once components are confirmed Will add last 4 CBG results once components are confirmed  Assessment/Plan:  CV-S/p NSTEMI. She had brief episodes of atrial tachycardia previously. PVCs recently.  SR this am and  mildly hypertensive. On Lopressor 50 mg bid and Lisinopril 10 mg daily. Will increase Lisinopril for better BP control. Echo done 01/29 showed LVEF 55-60%, no significant valvular abnormality, no regional wall abnormailty. Pulmonary-On 2 liters of oxygen via [place]. She does desat on room air and with ambulation below 88% so will arrange oxygen at discharge. S/p left thoracentesis with 700 cc removed.  Encourage incentive spirometer and flutter valve Volume overload-on Lasix 40 mg daily Expected post op blood loss anemia-Last H and H stable at 9.1 and 28.9 5. History of hypothyroidism-continue Levothyroxine 112 mcg daily 6. Mild confusion-no gross neuro deficit 7. Patient lives alone. PT/OT recommends home PT/OT. Need to discuss with social work/family  Donielle M ZimmermanPA-C 6:59 AM  Agree with above Titrating meds Dispo planning

## 2022-03-29 DIAGNOSIS — I214 Non-ST elevation (NSTEMI) myocardial infarction: Secondary | ICD-10-CM | POA: Diagnosis not present

## 2022-03-29 LAB — BASIC METABOLIC PANEL
Anion gap: 11 (ref 5–15)
BUN: 19 mg/dL (ref 8–23)
CO2: 32 mmol/L (ref 22–32)
Calcium: 8.8 mg/dL — ABNORMAL LOW (ref 8.9–10.3)
Chloride: 94 mmol/L — ABNORMAL LOW (ref 98–111)
Creatinine, Ser: 0.92 mg/dL (ref 0.44–1.00)
GFR, Estimated: 60 mL/min — ABNORMAL LOW (ref >60)
Glucose, Bld: 113 mg/dL — ABNORMAL HIGH (ref 70–99)
Potassium: 4 mmol/L (ref 3.5–5.1)
Sodium: 137 mmol/L (ref 135–145)

## 2022-03-29 MED ORDER — LISINOPRIL 20 MG PO TABS
20.0000 mg | ORAL_TABLET | Freq: Every day | ORAL | Status: DC
  Administered 2022-03-29 – 2022-03-31 (×3): 20 mg via ORAL
  Filled 2022-03-29 (×3): qty 1

## 2022-03-29 MED ORDER — LISINOPRIL 20 MG PO TABS: 20.0000 mg | ORAL_TABLET | Freq: Every day | ORAL | Status: DC

## 2022-03-29 NOTE — Progress Notes (Addendum)
Rounding Note    Patient Name: Alice [name] Date of Encounter: 03/29/2022  [place] Cardiologist: Freada Bergeron, MD   Subjective   Patient continues to endorse improvement in respiratory status and exertional tolerance. No chest pain or palpitations reported. She is understandably anxious to return home.  Inpatient Medications    Scheduled Meds:  aspirin  324 mg Oral Daily   Chlorhexidine Gluconate Cloth  6 each Topical Daily   enoxaparin (LOVENOX) injection  30 mg Subcutaneous QHS   furosemide  40 mg Oral Daily   levothyroxine  112 mcg Per Tube q AM   lisinopril  20 mg Oral Daily   metoprolol tartrate  50 mg Oral BID   omega-3 acid ethyl esters  1 g Oral Daily   pantoprazole  40 mg Oral Daily   potassium chloride SA  30 mEq Oral BID   rosuvastatin  20 mg Oral Daily   sodium chloride flush  3 mL Intravenous Q12H   Continuous Infusions:  sodium chloride     PRN Meds: sodium chloride, alum & mag hydroxide-simeth, magnesium hydroxide, menthol-cetylpyridinium, metoprolol tartrate, ondansetron (ZOFRAN) IV, mouth rinse, sodium chloride flush, traMADol, white petrolatum   Vital Signs    Vitals:   03/28/22 1950 03/29/22 0428 03/29/22 0555 03/29/22 0808  BP: (!) 153/84  (!) 159/78 (!) 155/89  Pulse: 72  68 67  Resp: '18  16 17  '$ Temp: 98.1 F (36.7 C)  97.8 F (36.6 C) 97.6 F (36.4 C)  TempSrc: Oral  Oral Oral  SpO2: 91%  95% 98%  Weight:  57 kg    Height:        Intake/Output Summary (Last 24 hours) at 03/29/2022 0844 Last data filed at 03/28/2022 2010 Gross per 24 hour  Intake 120 ml  Output --  Net 120 ml      03/29/2022    4:28 AM 03/28/2022    4:48 AM 03/27/2022    4:34 AM  Last 3 Weights  Weight (lbs) 125 lb 10.6 oz 128 lb 4.9 oz 127 lb 9.6 oz  Weight (kg) 57 kg 58.2 kg 57.879 kg      Telemetry    Sinus rhythm, isolated sinus tachycardia - Personally Reviewed  ECG    No new tracing - Personally Reviewed  Physical Exam   GEN:  No acute distress.   Neck: No JVD Cardiac: RRR, no murmurs, rubs, or gallops.  Respiratory: generally clear. Left base slightly diminished in comparison to right. No rales/crackles GI: Soft, nontender, non-distended  MS: 1-2+ edema around bilateral ankles, R>L. Neuro:  Nonfocal  Psych: Normal affect   Labs    High Sensitivity Troponin:   Recent Labs  Lab 03/19/22 0628 03/19/22 0815  TROPONINIHS 116* 126*     Chemistry Recent Labs  Lab 03/24/22 1559 03/26/22 0103 03/27/22 0650  NA 138 137 139  K 3.9 3.6 3.7  CL 100 94* 92*  CO2 27 32 37*  GLUCOSE 132* 122* 110*  BUN '22 18 18  '$ CREATININE 0.88 0.91 0.82  0.76  CALCIUM 8.2* 8.5* 8.6*  MG  --  1.8  --   GFRNONAA >60 >60 >60  >60  ANIONGAP '11 11 10    '$ Lipids No results for input(s): "CHOL", "TRIG", "HDL", "LABVLDL", "LDLCALC", "CHOLHDL" in the last 168 hours.  Hematology Recent Labs  Lab 03/23/22 0221 03/24/22 1559  WBC 9.1 9.8  RBC 2.90* 3.20*  HGB 8.2* 9.1*  HCT 26.2* 28.9*  MCV 90.3 90.3  MCH 28.3 28.4  MCHC 31.3 31.5  RDW 16.1* 16.2*  PLT 132* 202   Thyroid No results for input(s): "TSH", "FREET4" in the last 168 hours.  BNPNo results for input(s): "BNP", "PROBNP" in the last 168 hours.  DDimer No results for input(s): "DDIMER" in the last 168 hours.   Radiology    No results found.  Cardiac Studies   03/19/22 TTE   IMPRESSIONS     1. Left ventricular ejection fraction, by estimation, is 60 to 65%. The  left ventricle has normal function. The left ventricle has no regional  wall motion abnormalities. Left ventricular diastolic parameters are  consistent with Grade I diastolic  dysfunction (impaired relaxation).   2. Right ventricular systolic function is normal. The right ventricular  size is normal. There is mildly elevated pulmonary artery systolic  pressure.   3. Left atrial size was mildly dilated.   4. Large calcium deposit on PMVL.Marland Kitchen The mitral valve is degenerative. Mild  mitral valve  regurgitation. No evidence of mitral stenosis. Moderate  mitral annular calcification.   5. The aortic valve is normal in structure. Aortic valve regurgitation is  not visualized. Aortic valve sclerosis is present, with no evidence of  aortic valve stenosis. Aortic valve area, by VTI measures 1.44 cm. Aortic  valve mean gradient measures 8.0  mmHg. Aortic valve Vmax measures 1.81 m/s.   6. The inferior vena cava is dilated in size with >50% respiratory  variability, suggesting right atrial pressure of 8 mmHg.   FINDINGS   Left Ventricle: Left ventricular ejection fraction, by estimation, is 60  to 65%. The left ventricle has normal function. The left ventricle has no  regional wall motion abnormalities. The left ventricular internal cavity  size was normal in size. There is   no left ventricular hypertrophy. Left ventricular diastolic parameters  are consistent with Grade I diastolic dysfunction (impaired relaxation).  Indeterminate filling pressures.   Right Ventricle: The right ventricular size is normal. No increase in  right ventricular wall thickness. Right ventricular systolic function is  normal. There is mildly elevated pulmonary artery systolic pressure. The  tricuspid regurgitant velocity is 2.71   m/s, and with an assumed right atrial pressure of 8 mmHg, the estimated  right ventricular systolic pressure is 95.6 mmHg.   Left Atrium: Left atrial size was mildly dilated.   Right Atrium: Right atrial size was normal in size.   Pericardium: There is no evidence of pericardial effusion.   Mitral Valve: Large calcium deposit on PMVL. The mitral valve is  degenerative in appearance. There is mild thickening of the mitral valve  leaflet(s). Moderate mitral annular calcification. Mild mitral valve  regurgitation. No evidence of mitral valve  stenosis. MV peak gradient, 7.6 mmHg. The mean mitral valve gradient is  4.0 mmHg.   Tricuspid Valve: The tricuspid valve is normal in  structure. Tricuspid  valve regurgitation is mild . No evidence of tricuspid stenosis.   Aortic Valve: The aortic valve is normal in structure. Aortic valve  regurgitation is not visualized. Aortic valve sclerosis is present, with  no evidence of aortic valve stenosis. Aortic valve mean gradient measures  8.0 mmHg. Aortic valve peak gradient  measures 13.1 mmHg. Aortic valve area, by VTI measures 1.44 cm.   Pulmonic Valve: The pulmonic valve was normal in structure. Pulmonic valve  regurgitation is not visualized. No evidence of pulmonic stenosis.   Aorta: The aortic root and ascending aorta are structurally normal, with  no evidence of dilitation.  Venous: The inferior vena cava is dilated in size with greater than 50%  respiratory variability, suggesting right atrial pressure of 8 mmHg.   IAS/Shunts: No atrial level shunt detected by color flow Doppler.    03/19/22 LHC     Mid LM to Dist LM lesion is 99% stenosed.   Prox RCA lesion is 90% stenosed.   Mid LAD lesion is 60% stenosed.   03/26/22 Limited TTE   IMPRESSIONS     1. Left ventricular ejection fraction, by estimation, is 55 to 60%. The  left ventricle has normal function. The left ventricle has no regional  wall motion abnormalities. Left ventricular diastolic parameters are  indeterminate.   2. Right ventricular systolic function is mildly reduced. The right  ventricular size is normal. There is mildly elevated pulmonary artery  systolic pressure. The estimated right ventricular systolic pressure is  81.1 mmHg.   3. The mitral valve is degenerative. Moderate mitral annular  calcification. Mitral valve not fully assessed.   4. The aortic valve is tricuspid. There is moderate calcification of the  aortic valve. Aortic valve not fully assessed.   5. The inferior vena cava is normal in size with <50% respiratory  variability, suggesting right atrial pressure of 8 mmHg.   6. Limited echo for LV function.   FINDINGS    Left Ventricle: Left ventricular ejection fraction, by estimation, is 55  to 60%. The left ventricle has normal function. The left ventricle has no  regional wall motion abnormalities. Left ventricular diastolic parameters  are indeterminate.   Right Ventricle: The right ventricular size is normal. Right vetricular  wall thickness was not well visualized. Right ventricular systolic  function is mildly reduced. There is mildly elevated pulmonary artery  systolic pressure. The tricuspid regurgitant  velocity is 2.86 m/s, and with an assumed right atrial pressure of 8 mmHg,  the estimated right ventricular systolic pressure is 91.4 mmHg.   Mitral Valve: The mitral valve is degenerative in appearance. There is  moderate thickening of the mitral valve leaflet(s). Moderate mitral  annular calcification.   Tricuspid Valve: The tricuspid valve is normal in structure. Tricuspid  valve regurgitation is trivial.   Aortic Valve: The aortic valve is tricuspid. There is moderate  calcification of the aortic valve.   Venous: The inferior vena cava is normal in size with less than 50%  respiratory variability, suggesting right atrial pressure of 8 mmHg.   Patient Profile     89 y.o. female with a hx of HLD, prior tobacco abuse, hypothyroidism and history of breast cancer was admitted on 03/19/2022 for NSTEMI. Subsequent cardiac cath revealed critical left main lesion, patient underwent urgent CABG x2 (LIMA-LAD, SVG-OM) on 1/22.  Assessment & Plan    CAD with critical left main disease S/P CABG (LIMA-LAD and SVG-OM) NSTEMI   Patient admitted with chest pain, troponin 116->126, and new ST depressions in lateral leads. LHC with 99% LM stenosis prompting IABP placement and emergent CABG.    Repeat limited echo shows preserved LVEF, no wall motion abnormalities, trivial pericardial effusion.  Continue statin, ASA Continue Metoprolol Tartrate to '50mg'$  BID. Could consolidate at discharge. Initiate  Plavix at discharge    Dyspnea Pleural effusions   Patient with post-operative dyspnea worsening on 1/28. JVP noted to be elevated along with some bilateral lower extremity edema. CXR noted small stable left pleural effusion. PO Lasix changed to IV lasix with good urine output (~3L). Patient also underwent ultrasound guided thoracentesis on 1/29 with ~763m bloody  fluid removed. Now back on PO lasix.   CXR 1/30 with stable appearing effusions, left>right Patient appears near-euvolemic on physical exam, no JVP. Mild lower extremity edema, most noticeable in right leg used for vein graft. Patient's respiratory status continues to improve and she is tolerating ambulation >200 feet. I don't think that she will require ongoing lasix. BNP has not been checked in a couple of days, will order this morning. If creatinine/BUN elevated, would stop lasix entirely.      Tachyarrhythmia   Patient noted with an isolated episode of tachycardia on 1/29. Recurrent episode of this lasting 67mn overnight 1/29-1/30. Also with brief episode of bigeminy. Since increasing metoprolol, no significant recurrence of tachycardia. Remains in a stable sinus rhythm.   Continue Metoprolol Tartrate to '50mg'$  BID.    Hypertension  Patient developing elevated BP now 10 days s/p CABG. Lisinopril '20mg'$  has been ordered by her primary team. Agree with this dosing at this time. If patient were to have recurrence of tachy-arrhythmias as above, would prefer higher doses of metoprolol to lisinopril.       For questions or updates, please contact CHaywardPlease consult [email or web address] for contact info under        Signed, ELily Kocher PA-C  03/29/2022, 8:44 AM    I have seen and examined the patient along with ELily Kocher PA-C .  I have reviewed the chart, notes and new data.  I agree with PA/NP's note.  Key new complaints: Describes her breathing as shallow, but looks quite comfortable and speaks in lengthy  sentences without interruption.  No pain. Key examination changes: Reduced breath sounds left base, but otherwise normal cardiovascular and lung exam.  Trivial residual ankle edema in the saphenectomy harvest limb.  EP was high yesterday evening, but is down to 112/66 this morning. Key new findings / data: Brief paroxysmal atrial tachycardia lasting less than 5 seconds.  No atrial fibrillation is seen.  PLAN: Plan is for skilled nursing facility for rehab. Encourage use of incentive spirometer for left lung atelectasis. Continue current meds including rosuvastatin 20 mg daily, aspirin 325 mg daily, metoprolol 50 mg twice daily, lisinopril 20 mg daily and will probably need furosemide for no more than another day or 2. Start clopidogrel 75 mg daily at discharge due to presentation with acute coronary syndrome.  MSanda Klein MD, [phone]/02/2022, 9:57 AM

## 2022-03-29 NOTE — Progress Notes (Signed)
Mobility Specialist Progress Note:   03/29/22 1200  Therapy Vitals  Temp 97.6 F (36.4 C)  Temp Source Oral  Pulse Rate (!) 57  Resp 17  BP (!) 137/59  Patient Position (if appropriate) Lying  Oxygen Therapy  SpO2 93 %  O2 Device Room Air  Mobility  Activity Ambulated with assistance in hallway  Level of Assistance Contact guard assist, steadying assist  Assistive Device Front wheel walker  Distance Ambulated (ft) 100 ft  Activity Response Tolerated well  $Mobility charge 1 Mobility   Pt in bed willing to participate in mobility. No complaints of pain. Once pt got to nurses station she said she felt nauseous and needed to sit. Pt opted to be rolled back to the room. Ambulated from door to bed. Left in bed with call bell in reach and all needs met.   Gareth Eagle Kyaire Gruenewald Mobility Specialist Please contact via [employer] or  Rehab Office at [phone]

## 2022-03-29 NOTE — Progress Notes (Signed)
Physical Therapy Treatment Patient Details Name: Alice [name] MRN: [id number] DOB: 11-03-1930 Today's Date: 03/29/2022   History of Present Illness Pt is a 89 y.o. 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 Significant PMH: HLD, prior tobacco abuse, breast cancaer, hypothyroidism.    PT Comments    Pt greeted up in chair and agreeable to session with continued progress towards acute mobility goals, however pt limited by impaired cognition, decreased ability to recall precautions and impulsivity. Pt demonstrating gait with RW at min guard level, however pt needing MAX cues for safety awareness, adherence to sternal precautions and path finding, as pt attempting to enter incorrect room x2 and unable to locate room after one turn during ambulation, pt also pushing RW away from self to block path in room. As pt from home alone, discussed with supervising PT and updated recommendation to SNF to address deficits and maximize functional independence and pt safety and decrease caregiver burden. Pt continues to benefit from skilled PT services to progress toward functional mobility goals.    Recommendations for follow up therapy are one component of a multi-disciplinary discharge planning process, led by the attending physician.  Recommendations may be updated based on patient status, additional functional criteria and insurance authorization.  Follow Up Recommendations  Skilled nursing-short term rehab (<3 hours/day) Can patient physically be transported by private vehicle: Yes   Assistance Recommended at Discharge Intermittent Supervision/Assistance  Patient can return home with the following Assistance with cooking/housework;Assist for transportation;Help with stairs or ramp for entrance   Equipment Recommendations  Rollator (4 wheels);Other (comment) (tub bench)    Recommendations for Other Services       Precautions /  Restrictions Precautions Precautions: Sternal;Fall Precaution Booklet Issued: No Restrictions Weight Bearing Restrictions: No Other Position/Activity Restrictions: sternal precautions     Mobility  Bed Mobility Overal bed mobility: Needs Assistance             General bed mobility comments: up in chair pre and post session    Transfers Overall transfer level: Needs assistance Equipment used: Rolling walker (2 wheels) Transfers: Sit to/from Stand Sit to Stand: Min guard           General transfer comment: min guard to rise from EOB/ toilet with min guard, MAX cues for hand placement 2/2 sternal precautions    Ambulation/Gait Ambulation/Gait assistance: Min guard Gait Distance (Feet): 250 Feet Assistive device: Rolling walker (2 wheels) Gait Pattern/deviations: Step-through pattern, Decreased stride length, Drifts right/left Gait velocity: decr     General Gait Details: slow gait with RW, no overt LOB, however pt needing MAX cues for safety awarness and pathfinding as pt with noted short term memory deficits as well as needing max cues to reamin on task at hand   Stairs             Wheelchair Mobility    Modified Rankin (Stroke Patients Only)       Balance Overall balance assessment: Needs assistance Sitting-balance support: Feet supported, No upper extremity supported Sitting balance-Leahy Scale: Good     Standing balance support: During functional activity Standing balance-Leahy Scale: Fair Standing balance comment: static standing at sink for ADLs                            Cognition Arousal/Alertness: Awake/alert Behavior During Therapy: WFL for tasks assessed/performed, Impulsive Overall Cognitive Status: Impaired/Different from  baseline Area of Impairment: Safety/judgement, Memory, Orientation, Awareness, Problem solving                 Orientation Level: Disoriented to, Situation   Memory: Decreased recall of  precautions, Decreased short-term memory   Safety/Judgement: Decreased awareness of safety, Decreased awareness of deficits Awareness: Emergent Problem Solving: Requires verbal cues, Requires tactile cues General Comments: pt needing max cues for safety and adherence to sternal precautions, Pt interanally distracted throughout session needing cues to remain on task and as pt easily distractable, pt trying to enter incorrect room x2, needing max cues for path finding back to room and noted short term memory deficits, unable to recall her request for more socks and to continue breakfast in chair, as pt getting back to room and looking at bed "well how am i supposed to get in there?" pt with poor safety awarness pushing walker away from self on entering room and blocking pathway        Exercises      General Comments General comments (skin integrity, edema, etc.): VSS on RA      Pertinent Vitals/Pain Pain Assessment Pain Assessment: No/denies pain Pain Intervention(s): Monitored during session    Home Living                          Prior Function            PT Goals (current goals can now be found in the care plan section) Acute Rehab PT Goals PT Goal Formulation: With patient Time For Goal Achievement: 04/07/22 Progress towards PT goals: Progressing toward goals    Frequency    Min 3X/week      PT Plan Discharge plan needs to be updated    Co-evaluation              AM-PAC PT "6 Clicks" Mobility   Outcome Measure  Help needed turning from your back to your side while in a flat bed without using bedrails?: None Help needed moving from lying on your back to sitting on the side of a flat bed without using bedrails?: A Little Help needed moving to and from a bed to a chair (including a wheelchair)?: A Little Help needed standing up from a chair using your arms (e.g., wheelchair or bedside chair)?: A Little Help needed to walk in hospital room?: A  Little Help needed climbing 3-5 steps with a railing? : A Little 6 Click Score: 19    End of Session   Activity Tolerance: Patient tolerated treatment well Patient left: in chair;with call bell/phone within reach;with chair alarm set Nurse Communication: Mobility status PT Visit Diagnosis: Unsteadiness on feet (R26.81);Difficulty in walking, not elsewhere classified (R26.2)     Time: 2440-1027 PT Time Calculation (min) (ACUTE ONLY): 17 min  Charges:  $Therapeutic Activity: 8-22 mins                     Kayshaun Polanco R. PTA Acute Rehabilitation Services Office: [place] 03/29/2022, 9:39 AM

## 2022-03-29 NOTE — Progress Notes (Addendum)
[address]       [place] [address]             [phone]        10 Days Post-Op Procedure(s) (LRB): CORONARY ARTERY BYPASS GRAFTING (CABG) X TWO, USING LEFT INTERNAL MAMMARY ARTERY AND RIGHT LEG GREATER SAPHENOUS VEIN HARVESTED ENDOSCOPICALLY (N/A) TRANSESOPHAGEAL ECHOCARDIOGRAM (N/A)  Subjective: Patient sitting in chair. She states she is cold so I gave her several blankets.  Objective: Vital signs in last 24 hours: Temp:  [97.5 F (36.4 C)-98.1 F (36.7 C)] 97.8 F (36.6 C) (02/01 0555) Pulse Rate:  [68-76] 68 (02/01 0555) Cardiac Rhythm: Normal sinus rhythm (01/31 1900) Resp:  [16-20] 16 (02/01 0555) BP: (84-161)/(69-84) 159/78 (02/01 0555) SpO2:  [91 %-100 %] 95 % (02/01 0555) Weight:  [57 kg] 57 kg (02/01 0428)  Pre op weight  54.4 kg Current Weight  03/29/22 57 kg      Intake/Output from previous day: 01/31 0701 - 02/01 0700 In: 260 [P.O.:260] Out: -    Physical Exam:  Cardiovascular: RRR Pulmonary: Clear to auscultation bilaterally Abdomen: Soft, non tender, bowel sounds present. Extremities: Mild bilateral lower extremity edema R>L Wounds: Clean and dry.  No erythema or signs of infection.  Lab Results: CBC: No results for input(s): "WBC", "HGB", "HCT", "PLT" in the last 72 hours.  BMET:  Recent Labs    03/27/22 0650  NA 139  K 3.7  CL 92*  CO2 37*  GLUCOSE 110*  BUN 18  CREATININE 0.82  0.76  CALCIUM 8.6*     PT/INR:  Lab Results  Component Value Date   INR 1.5 (H) 03/19/2022   INR 0.94 09/10/2014   ABG:  INR: Will add last result for INR, ABG once components are confirmed Will add last 4 CBG results once components are confirmed  Assessment/Plan:  CV-S/p NSTEMI.  SR this am and  hypertensive. On Lopressor 50 mg bid and Lisinopril 20 mg daily. It appears Lisinopril accidentally stopped yesterday so will start at 20 mg daily today Pulmonary-On room air. S/p left thoracentesis with 700 cc removed.   Encourage incentive spirometer and flutter valve Volume overload-on Lasix 40 mg daily Expected post op blood loss anemia-Last H and H stable at 9.1 and 28.9 5. History of hypothyroidism-continue Levothyroxine 112 mcg daily 6. Mild confusion-no gross neuro deficit 7. Patient lives alone. As discussed with son again, needs SNF at discharge. OT now recommends SNF  Donielle M ZimmermanPA-C 7:03 AM  Agree with above Feels better today [place]

## 2022-03-30 MED FILL — Heparin Sodium (Porcine) Inj 1000 Unit/ML: INTRAMUSCULAR | Qty: 10 | Status: AC

## 2022-03-30 MED FILL — Electrolyte-R (PH 7.4) Solution: INTRAVENOUS | Qty: 4000 | Status: AC

## 2022-03-30 MED FILL — Sodium Bicarbonate IV Soln 8.4%: INTRAVENOUS | Qty: 50 | Status: AC

## 2022-03-30 MED FILL — Mannitol IV Soln 20%: INTRAVENOUS | Qty: 500 | Status: AC

## 2022-03-30 MED FILL — Sodium Chloride IV Soln 0.9%: INTRAVENOUS | Qty: 2000 | Status: AC

## 2022-03-30 MED FILL — Magnesium Sulfate Inj 50%: INTRAMUSCULAR | Qty: 10 | Status: AC

## 2022-03-30 NOTE — Significant Event (Addendum)
Rapid Response Event Note   Reason for Call :  Chest pain pt CABG x2 POD 11  Initial Focused Assessment:  Pt lying in bed, AO. Skin is warm, dry, pink. Breathing is regular, unlabored. Lung sounds are clear. No adventitious heart sounds. Pt endorses pain to her left chest, around her sternum and across her breast. She states she gets some relief when she gently presses.   She also endorses ankle pain and dry lips. She requests to get up and walk. Pt currently refusing PRN pain medication because it makes her sleepy.   EKG completed.   VS: T 97.24F, BP 140/67, HR 69, RR 24, SpO2 100% room air  Interventions:  -EKG  Plan of Care:  -RN to follow-up with attending MD regarding pain and further interventions  Call rapid response for additional needs  Event Summary:  MD Notified: per RN Call Time: 1802 Arrival Time: 1805 End Time: [place], RN

## 2022-03-30 NOTE — NC FL2 (Signed)
[place] LEVEL OF CARE FORM     IDENTIFICATION  Patient Name: Alice [name] Birthdate: [birth year] Sex: female Admission Date (Current Location): 03/19/2022  [facility] and [place] Number:  Herbalist and Address:  [facility] [place] [place] [address], [place], [place] [address]      Provider Number: [id number]  Attending Physician Name and Address:  Melrose Nakayama, MD  Relative Name and Phone Number:       Current Level of Care: Hospital Recommended Level of Care: [place] Prior Approval Number:    Date Approved/Denied:   PASRR Number: [id number]  Discharge Plan: SNF    Current Diagnoses: Patient Active Problem List   Diagnosis Date Noted   NSTEMI (non-ST elevated myocardial infarction) ([place]) 03/19/2022   S/P CABG x 2 03/19/2022   Depression, recurrent ([place]) 02/01/2020   Atherosclerosis of aorta ([place]) 09/15/2018   Iron deficiency anemia due to chronic blood loss 08/12/2016   Mild anemia 07/06/2016   Left torticollis 08/11/2015   Hypothyroidism due to acquired atrophy of thyroid 08/11/2015   Internal and external bleeding hemorrhoids 08/11/2015   Osteoarthritis of right hip 09/10/2014   Status post total replacement of right hip 09/10/2014   Malignant neoplasm of upper-outer quadrant of right breast in female, estrogen receptor positive ([place]) 01/07/2013   Arthralgia 12/08/2012   Anxiety 02/12/2012   Cat bite of finger 08/10/2011   Cellulitis 08/10/2011    Orientation RESPIRATION BLADDER Height & Weight     Self, Time, Situation, Place  Normal Continent Weight: 123 lb 3.8 oz (55.9 kg) Height:  '5\' 2"'$  (157.5 cm)  BEHAVIORAL SYMPTOMS/MOOD NEUROLOGICAL BOWEL NUTRITION STATUS      Continent Diet (please see discharge summary)  AMBULATORY STATUS COMMUNICATION OF NEEDS Skin   Limited Assist Verbally Surgical wounds (closed incision sterum, pretibial proximal, RT)                       Personal Care  Assistance Level of Assistance  Bathing, Feeding, Dressing Bathing Assistance: Limited assistance Feeding assistance: Independent Dressing Assistance: Limited assistance     Functional Limitations Info  Sight, Hearing, Speech Sight Info: Impaired Hearing Info: Adequate Speech Info: Adequate    SPECIAL CARE FACTORS FREQUENCY  PT (By licensed PT), OT (By licensed OT)     PT Frequency: 5x per week OT Frequency: 5x per week            Contractures Contractures Info: Not present    Additional Factors Info  Code Status, Allergies Code Status Info: FULL Code Allergies Info: Amoxicillin,Diphenhydramine,naproxen sodium,lipitor,meclizine,amlodipine,cefdinir,codeine,ferrous sulfate,methocarbamol,presnisone,skelaxin,tramadol,hctz           Current Medications (03/30/2022):  This is the current hospital active medication list Current Facility-Administered Medications  Medication Dose Route Frequency Provider Last Rate Last Admin   0.9 %  sodium chloride infusion  250 mL Intravenous PRN Melrose Nakayama, MD       alum & mag hydroxide-simeth (MAALOX/MYLANTA) 200-200-20 MG/5ML suspension 15 mL  15 mL Oral Q6H PRN Melrose Nakayama, MD       aspirin chewable tablet 324 mg  324 mg Oral Daily Melrose Nakayama, MD   324 mg at 03/30/22 0930   Chlorhexidine Gluconate Cloth 2 % PADS 6 each  6 each Topical Daily Melrose Nakayama, MD   6 each at 03/24/22 1028   enoxaparin (LOVENOX) injection 30 mg  30 mg Subcutaneous QHS Melrose Nakayama, MD   30  mg at 03/29/22 2104   furosemide (LASIX) tablet 40 mg  40 mg Oral Daily Lily Kocher, PA-C   40 mg at 03/30/22 0321   levothyroxine (SYNTHROID) tablet 112 mcg  112 mcg Per Tube q AM Melrose Nakayama, MD   112 mcg at 03/30/22 2248   lisinopril (ZESTRIL) tablet 20 mg  20 mg Oral Daily Lars Pinks M, PA-C   20 mg at 03/30/22 0930   magnesium hydroxide (MILK OF MAGNESIA) suspension 30 mL  30 mL Oral Daily PRN Melrose Nakayama, MD       menthol-cetylpyridinium (CEPACOL) lozenge 3 mg  1 lozenge Oral PRN Melrose Nakayama, MD       metoprolol tartrate (LOPRESSOR) injection 2.5-5 mg  2.5-5 mg Intravenous Q2H PRN Melrose Nakayama, MD       metoprolol tartrate (LOPRESSOR) tablet 50 mg  50 mg Oral BID Lily Kocher, PA-C   50 mg at 03/30/22 2500   omega-3 acid ethyl esters (LOVAZA) capsule 1 g  1 g Oral Daily Melrose Nakayama, MD   1 g at 03/30/22 0930   ondansetron (ZOFRAN) injection 4 mg  4 mg Intravenous Q6H PRN Melrose Nakayama, MD   4 mg at 03/23/22 1005   Oral care mouth rinse  15 mL Mouth Rinse PRN Melrose Nakayama, MD       pantoprazole (PROTONIX) EC tablet 40 mg  40 mg Oral Daily Melrose Nakayama, MD   40 mg at 03/30/22 0930   potassium chloride (KLOR-CON M) CR tablet 30 mEq  30 mEq Oral BID Lars Pinks M, PA-C   30 mEq at 03/30/22 3704   rosuvastatin (CRESTOR) tablet 20 mg  20 mg Oral Daily Melrose Nakayama, MD   20 mg at 03/30/22 0930   sodium chloride flush (NS) 0.9 % injection 3 mL  3 mL Intravenous Q12H Melrose Nakayama, MD   3 mL at 03/26/22 2100   sodium chloride flush (NS) 0.9 % injection 3 mL  3 mL Intravenous PRN Melrose Nakayama, MD       traMADol Veatrice Bourbon) tablet 50 mg  50 mg Oral Q6H PRN Melrose Nakayama, MD   50 mg at 03/30/22 0315   white petrolatum (VASELINE) gel   Topical PRN Melrose Nakayama, MD         Discharge Medications: Please see discharge summary for a list of discharge medications.  Relevant Imaging Results:  Relevant Lab Results:   Additional Information SSN # [id number]  Vinie Sill, LCSW

## 2022-03-30 NOTE — TOC Progression Note (Signed)
Transition of Care [facility]) - Progression Note    Patient Details  Name: Alice [name] MRN: [id number] Date of Birth: 1930/09/21  Transition of Care [facility]) CM/SW [place], [place] Phone Number: 03/30/2022, 12:56 PM  Clinical Narrative:      CSW sent out SNF referral and will provide bed offers once available.  TOC will continue to follow and assist with discharge planning.  Thurmond Butts, MSW, LCSW Clinical Social Worker     Barriers to Discharge: Ship broker, SNF Pending bed offer  Expected Discharge Plan and Services In-house Referral: Clinical Social Work                                             Social Determinants of Health (SDOH) Interventions SDOH Screenings   Food Insecurity: No Food Insecurity (10/14/2017)  Transportation Needs: No Transportation Needs (10/14/2017)  Alcohol Screen: Low Risk  (05/01/2018)  Depression (PHQ2-9): Medium Risk (02/01/2020)  Financial Resource Strain: Low Risk  (10/14/2017)  Physical Activity: Sufficiently Active (10/14/2017)  Social Connections: Somewhat Isolated (10/14/2017)  Stress: Stress Concern Present (10/14/2017)  Tobacco Use: Medium Risk (03/26/2022)    Readmission Risk Interventions     No data to display

## 2022-03-30 NOTE — Progress Notes (Signed)
Mobility Specialist Progress Note:   03/30/22 1021  Mobility  Activity Ambulated with assistance in hallway  Level of Assistance Contact guard assist, steadying assist  Assistive Device Front wheel walker  Distance Ambulated (ft) 200 ft  Activity Response Tolerated well  Mobility Referral Yes  $Mobility charge 1 Mobility   Pre-mobility: 64 HR  Pt received in bed and agreeable. C/o fatigue. Pt returned to chair with all needs met, call bell in reach, and son in room.   Andrey Campanile Mobility Specialist Please contact via SecureChat or  Rehab office at [phone]

## 2022-03-30 NOTE — Progress Notes (Signed)
Patient complains of Left sided chest and shoulder pain,and feels cold even with several blankets on.vitals checked,rapid response notified,EKG done and MD  made aware,will continue to monitor

## 2022-03-30 NOTE — Progress Notes (Addendum)
      [address]       [place] [address]             [phone]        11 Days Post-Op Procedure(s) (LRB): CORONARY ARTERY BYPASS GRAFTING (CABG) X TWO, USING LEFT INTERNAL MAMMARY ARTERY AND RIGHT LEG GREATER SAPHENOUS VEIN HARVESTED ENDOSCOPICALLY (N/A) TRANSESOPHAGEAL ECHOCARDIOGRAM (N/A)  Subjective: Patient sleeping and briefly awakened. Her lips and mouth are dry this am;given lip moisturizer and water  Objective: Vital signs in last 24 hours: Temp:  [97.3 F (36.3 C)-98.4 F (36.9 C)] 97.3 F (36.3 C) (02/02 0617) Pulse Rate:  [57-68] 67 (02/02 0617) Cardiac Rhythm: Supraventricular tachycardia (02/02 0423) Resp:  [17-26] 21 (02/02 0617) BP: (112-155)/(59-89) 136/69 (02/02 0617) SpO2:  [90 %-98 %] 93 % (02/02 0617) Weight:  [55.9 kg] 55.9 kg (02/02 0617)  Pre op weight  54.4 kg Current Weight  03/30/22 55.9 kg      Intake/Output from previous day: 02/01 0701 - 02/02 0700 In: 600 [P.O.:600] Out: -    Physical Exam:  Cardiovascular: RRR Pulmonary: Clear to auscultation bilaterally Abdomen: Soft, non tender, bowel sounds present. Extremities: Trace bilateral lower extremity edema R>L Wounds: Clean and dry.  No erythema or signs of infection.  Lab Results: CBC: No results for input(s): "WBC", "HGB", "HCT", "PLT" in the last 72 hours.  BMET:  Recent Labs    03/29/22 0858  NA 137  K 4.0  CL 94*  CO2 32  GLUCOSE 113*  BUN 19  CREATININE 0.92  CALCIUM 8.8*     PT/INR:  Lab Results  Component Value Date   INR 1.5 (H) 03/19/2022   INR 0.94 09/10/2014   ABG:  INR: Will add last result for INR, ABG once components are confirmed Will add last 4 CBG results once components are confirmed  Assessment/Plan:  CV-S/p NSTEMI.  She had increased HR briefly into 140's around 2 am (previously had atrial tachycardia). SR and BP better controlled this am. On Lopressor 50 mg bid and Lisinopril 20 mg daily. Pulmonary-On room air. S/p  left thoracentesis with 700 cc removed.  Encourage incentive spirometer and flutter valve Volume overload-On Lasix 40 mg daily. Will not need at discharge Expected post op blood loss anemia-Last H and H stable at 9.1 and 28.9 5. History of hypothyroidism-continue Levothyroxine 112 mcg daily 6. Mild confusion-no gross neuro deficit 7. Patient lives alone. As discussed with son again, needs SNF at discharge. OT/PTnow recommends SNF. Hopefully, she will go today  Awab Abebe M ZimmermanPA-C 7:01 AM

## 2022-03-30 NOTE — TOC Transition Note (Signed)
Transition of Care [facility]) - CM/SW Discharge Note   Patient Details  Name: Alice [name] MRN: [id number] Date of Birth: Apr 08, 1930  [facility] CM/SW Contact:  Verdell Carmine, RN Phone Number: 03/30/2022, 10:47 AM   Clinical Narrative:     Patient son called and wanted to know an update to DC, the plan is to go to SNF. Son states he has not spoken to anyone in a while about plan. Looked at notes, PT recommended SNF, could potentially go today. Son also states the patient has a service dog, he would like somewhere where the dog can visit as needed. Messaged CSW regarding conversation . Notes from TCTS stated that the patient is ready to move on to SNF.    Barriers to Discharge:  (family will discuss options and call CSW back)   Patient Goals and CMS Choice      Discharge Placement                         Discharge Plan and Services Additional resources added to the After Visit Summary for   In-house Referral: Clinical Social Work                                   Social Determinants of Health (SDOH) Interventions SDOH Screenings   Food Insecurity: No Food Insecurity (10/14/2017)  Transportation Needs: No Transportation Needs (10/14/2017)  Alcohol Screen: Low Risk  (05/01/2018)  Depression (PHQ2-9): Medium Risk (02/01/2020)  Financial Resource Strain: Low Risk  (10/14/2017)  Physical Activity: Sufficiently Active (10/14/2017)  Social Connections: Somewhat Isolated (10/14/2017)  Stress: Stress Concern Present (10/14/2017)  Tobacco Use: Medium Risk (03/26/2022)     Readmission Risk Interventions     No data to display

## 2022-03-30 NOTE — Progress Notes (Signed)
Pt relays interest in CRPII after SNF. Reports son is coming later.    Alice [name] 03/30/2022 9:03 AM

## 2022-03-31 DIAGNOSIS — I214 Non-ST elevation (NSTEMI) myocardial infarction: Secondary | ICD-10-CM | POA: Diagnosis not present

## 2022-03-31 NOTE — Progress Notes (Signed)
Mobility Specialist Progress Note:   03/31/22 1450  Mobility  Activity Ambulated with assistance in room  Level of Assistance Contact guard assist, steadying assist  Assistive Device Other (Comment) (HHA)  Distance Ambulated (ft) 40 ft  Activity Response Tolerated well  Mobility Referral Yes  $Mobility charge 1 Mobility   Pt received sitting EOB and agreeable to ambulate in room. C/o fatigue from recent walk with son (~2109f). Pt returned to sitting EOB with all needs met, call bell in reach, and son in room.   KAndrey CampanileMobility Specialist Please contact via SecureChat or  Rehab office at [phone]

## 2022-03-31 NOTE — Progress Notes (Signed)
      [address]       [place] [address]             [phone]      12 Days Post-Op Procedure(s) (LRB): CORONARY ARTERY BYPASS GRAFTING (CABG) X TWO, USING LEFT INTERNAL MAMMARY ARTERY AND RIGHT LEG GREATER SAPHENOUS VEIN HARVESTED ENDOSCOPICALLY (N/A) TRANSESOPHAGEAL ECHOCARDIOGRAM (N/A)  Subjective:  Events of last night noted. Patient experiencing pain that is not unexpected with her recent surgery.  She is ambulating.  + BM  Objective: Vital signs in last 24 hours: Temp:  [97.2 F (36.2 C)-98.4 F (36.9 C)] 98.1 F (36.7 C) (02/03 0734) Pulse Rate:  [63-69] 64 (02/03 0734) Cardiac Rhythm: Normal sinus rhythm (02/02 2022) Resp:  [14-24] 17 (02/03 0734) BP: (94-140)/(51-95) 131/95 (02/03 0734) SpO2:  [92 %-100 %] 95 % (02/03 0734) Weight:  [56.4 kg] 56.4 kg (02/03 0501)  Intake/Output from previous day: 02/02 0701 - 02/03 0700 In: 120 [P.O.:120] Out: -  Intake/Output this shift: Total I/O In: 120 [P.O.:120] Out: -   General appearance: alert, cooperative, and no distress Heart: regular rate and rhythm Lungs: clear to auscultation bilaterally Abdomen: soft, non-tender; bowel sounds normal; no masses,  no organomegaly Extremities: edema trace Wound: clean and dry, ecchymosis RLE from [facility]  Lab Results: No results for input(s): "WBC", "HGB", "HCT", "PLT" in the last 72 hours. BMET:  Recent Labs    03/29/22 0858  NA 137  K 4.0  CL 94*  CO2 32  GLUCOSE 113*  BUN 19  CREATININE 0.92  CALCIUM 8.8*    PT/INR: No results for input(s): "LABPROT", "INR" in the last 72 hours. ABG    Component Value Date/Time   PHART 7.285 (L) 03/20/2022 1509   HCO3 24.2 03/20/2022 1509   TCO2 26 03/20/2022 1509   ACIDBASEDEF 3.0 (H) 03/20/2022 1509   O2SAT 97 03/20/2022 1509   CBG (last 3)  No results for input(s): "GLUCAP" in the last 72 hours.  Assessment/Plan: S/P Procedure(s) (LRB): CORONARY ARTERY BYPASS GRAFTING (CABG) X TWO, USING LEFT  INTERNAL MAMMARY ARTERY AND RIGHT LEG GREATER SAPHENOUS VEIN HARVESTED ENDOSCOPICALLY (N/A) TRANSESOPHAGEAL ECHOCARDIOGRAM (N/A)  CV- NSR,  BP remains controlled, continue Lopressor, Lisinopril Pulm- off oxygen, continue IS Renal- creatinine, weight is trending down, continue Lasix, potassium for now Hypothyroid- continue synthroid Mild confusion-stable Deconditioning- patient lives alone, needs SNF at discharge, awaiting insurance authorization and bed offers Dispo- patient stable, continue current care.. she is ready for d/c once SNF is arranged   LOS: 12 days    Ellwood Handler, PA-C 03/31/2022

## 2022-03-31 NOTE — Progress Notes (Signed)
Pt sleeping soundly. Son in room and sts he just walked with her earlier this am. Reviewed sternal precautions and IS with him and encouraged him to walk with her x3 a day. Pt doing well. Will f/u as able. [phone] Alice [name] BS, ACSM-CEP 03/31/2022 11:55 AM

## 2022-03-31 NOTE — Progress Notes (Signed)
Rounding Note    Patient Name: Alice [name] Date of Encounter: 03/31/2022  [place] Cardiologist: Freada Bergeron, MD    Subjective    89 yo admitted with severe CP Has severe disease at cath  Is now s/p CABG Appears to be healing , recovering well   BP is mildly elevated. HR is controlled   Echo from Jan. 29 shows normal LV systolic function with EF 55-60%.  Awake , alert ,    Inpatient Medications    Scheduled Meds:  aspirin  324 mg Oral Daily   Chlorhexidine Gluconate Cloth  6 each Topical Daily   enoxaparin (LOVENOX) injection  30 mg Subcutaneous QHS   furosemide  40 mg Oral Daily   levothyroxine  112 mcg Per Tube q AM   lisinopril  20 mg Oral Daily   metoprolol tartrate  50 mg Oral BID   omega-3 acid ethyl esters  1 g Oral Daily   pantoprazole  40 mg Oral Daily   potassium chloride SA  30 mEq Oral BID   rosuvastatin  20 mg Oral Daily   sodium chloride flush  3 mL Intravenous Q12H   Continuous Infusions:  sodium chloride     PRN Meds: sodium chloride, alum & mag hydroxide-simeth, magnesium hydroxide, menthol-cetylpyridinium, metoprolol tartrate, ondansetron (ZOFRAN) IV, mouth rinse, sodium chloride flush, traMADol, white petrolatum   Vital Signs    Vitals:   03/30/22 2001 03/31/22 0501 03/31/22 0734 03/31/22 0944  BP: (!) 112/56 108/71 (!) 131/95 (!) 140/71  Pulse: 65 63 64 62  Resp: '20 20 17   '$ Temp: 97.6 F (36.4 C) 98 F (36.7 C) 98.1 F (36.7 C)   TempSrc: Oral Oral Oral   SpO2: 93% 95% 95%   Weight:  56.4 kg    Height:        Intake/Output Summary (Last 24 hours) at 03/31/2022 1047 Last data filed at 03/31/2022 3267 Gross per 24 hour  Intake 120 ml  Output --  Net 120 ml      03/31/2022    5:01 AM 03/30/2022    6:17 AM 03/29/2022    4:28 AM  Last 3 Weights  Weight (lbs) 124 lb 5.4 oz 123 lb 3.8 oz 125 lb 10.6 oz  Weight (kg) 56.4 kg 55.9 kg 57 kg      Telemetry    NSR - Personally Reviewed  ECG     - Personally  Reviewed  Physical Exam   GEN: elderly female,   NAD  Neck: No JVD Cardiac: RRR, no murmurs, rubs, or gallops.  Respiratory: Clear to auscultation bilaterally. GI: Soft, nontender, non-distended  MS: No edema; No deformity. Neuro:  Nonfocal  Psych: Normal affect   Labs    High Sensitivity Troponin:   Recent Labs  Lab 03/19/22 0628 03/19/22 0815  TROPONINIHS 116* 126*     Chemistry Recent Labs  Lab 03/26/22 0103 03/27/22 0650 03/29/22 0858  NA 137 139 137  K 3.6 3.7 4.0  CL 94* 92* 94*  CO2 32 37* 32  GLUCOSE 122* 110* 113*  BUN '18 18 19  '$ CREATININE 0.91 0.82  0.76 0.92  CALCIUM 8.5* 8.6* 8.8*  MG 1.8  --   --   GFRNONAA >60 >60  >60 60*  ANIONGAP '11 10 11    '$ Lipids No results for input(s): "CHOL", "TRIG", "HDL", "LABVLDL", "LDLCALC", "CHOLHDL" in the last 168 hours.  Hematology Recent Labs  Lab 03/24/22 1559  WBC 9.8  RBC 3.20*  HGB 9.1*  HCT 28.9*  MCV 90.3  MCH 28.4  MCHC 31.5  RDW 16.2*  PLT 202   Thyroid No results for input(s): "TSH", "FREET4" in the last 168 hours.  BNPNo results for input(s): "BNP", "PROBNP" in the last 168 hours.  DDimer No results for input(s): "DDIMER" in the last 168 hours.   Radiology    No results found.  Cardiac Studies      Patient Profile     89 y.o. female    [place]      1.  Coronary artery disease.  Status post coronary artery bypass grafting overall seems to be healing well.  2.  Hypertension: Blood pressures in the 140s today.  Lisinopril has been added.  I would suggest increasing her lisinopril or perhaps changing to valsartan for blood pressure remains elevated.  3.  Tachyarrhythmia: Heart rate is much better controlled today.  Continue metoprolol 50 mg p.o. twice daily.           For questions or updates, please contact [place] Please consult [email or web address] for contact info under        Signed, Mertie Moores, MD  03/31/2022, 10:47 AM

## 2022-04-01 MED ORDER — LISINOPRIL 20 MG PO TABS
40.0000 mg | ORAL_TABLET | Freq: Every day | ORAL | Status: DC
  Administered 2022-04-01 – 2022-04-03 (×3): 40 mg via ORAL
  Filled 2022-04-01 (×3): qty 2

## 2022-04-01 NOTE — Progress Notes (Signed)
Mobility Specialist Progress Note:   04/01/22 1256  Mobility  Activity Ambulated with assistance in hallway  Level of Assistance Contact guard assist, steadying assist  Assistive Device Front wheel walker  Distance Ambulated (ft) 80 ft  Activity Response Tolerated fair  Mobility Referral Yes  $Mobility charge 1 Mobility   Pt received in bed and agreeable. C/o fatigue and weakness throughout ambulation. Pt deferred longer ambulation d/t weakness. Pt returned to bed with all needs met, call bell in reach, and son in room.   Andrey Campanile Mobility Specialist Please contact via SecureChat or  Rehab office at [phone]

## 2022-04-01 NOTE — Progress Notes (Signed)
      [address]       [place], [address]             [phone]    13 Days Post-Op Procedure(s) (LRB): CORONARY ARTERY BYPASS GRAFTING (CABG) X TWO, USING LEFT INTERNAL MAMMARY ARTERY AND RIGHT LEG GREATER SAPHENOUS VEIN HARVESTED ENDOSCOPICALLY (N/A) TRANSESOPHAGEAL ECHOCARDIOGRAM (N/A)  Subjective:  No new complaints.  Remains pleasantly confused.  Objective: Vital signs in last 24 hours: Temp:  [97.4 F (36.3 C)-97.9 F (36.6 C)] 97.9 F (36.6 C) (02/04 0308) Pulse Rate:  [60-65] 60 (02/04 0308) Cardiac Rhythm: Normal sinus rhythm (02/04 0808) Resp:  [18-23] 20 (02/04 0308) BP: (109-155)/(64-84) 155/73 (02/04 0308) SpO2:  [93 %-97 %] 93 % (02/04 0308) Weight:  [56.8 kg] 56.8 kg (02/04 0700)  Intake/Output from previous day: 02/03 0701 - 02/04 0700 In: 200 [P.O.:200] Out: 0   General appearance: alert, cooperative, and no distress Heart: regular rate and rhythm Lungs: clear to auscultation bilaterally Abdomen: soft, non-tender; bowel sounds normal; no masses,  no organomegaly Extremities: extremities normal, atraumatic, no cyanosis or edema Wound: clean and dry  Lab Results: No results for input(s): "WBC", "HGB", "HCT", "PLT" in the last 72 hours. BMET:  Recent Labs    03/29/22 0858  NA 137  K 4.0  CL 94*  CO2 32  GLUCOSE 113*  BUN 19  CREATININE 0.92  CALCIUM 8.8*    PT/INR: No results for input(s): "LABPROT", "INR" in the last 72 hours. ABG    Component Value Date/Time   PHART 7.285 (L) 03/20/2022 1509   HCO3 24.2 03/20/2022 1509   TCO2 26 03/20/2022 1509   ACIDBASEDEF 3.0 (H) 03/20/2022 1509   O2SAT 97 03/20/2022 1509   CBG (last 3)  No results for input(s): "GLUCAP" in the last 72 hours.  Assessment/Plan: S/P Procedure(s) (LRB): CORONARY ARTERY BYPASS GRAFTING (CABG) X TWO, USING LEFT INTERNAL MAMMARY ARTERY AND RIGHT LEG GREATER SAPHENOUS VEIN HARVESTED ENDOSCOPICALLY (N/A) TRANSESOPHAGEAL ECHOCARDIOGRAM (N/A)  CV-  NSR, BP remains elevated- continue Lopressor, will increase Lisinopril Pulm- off oxygen, continue IS Hypothyroid-continue synthroid Mild Confusion- stable Deconditioning-awaiting insurance auth, patient lives alone.. hopefully will get offer for SNF tomorrow   LOS: 13 days   Ellwood Handler, PA-C 04/01/2022

## 2022-04-02 DIAGNOSIS — I214 Non-ST elevation (NSTEMI) myocardial infarction: Secondary | ICD-10-CM | POA: Diagnosis not present

## 2022-04-02 DIAGNOSIS — I10 Essential (primary) hypertension: Secondary | ICD-10-CM

## 2022-04-02 DIAGNOSIS — E7800 Pure hypercholesterolemia, unspecified: Secondary | ICD-10-CM

## 2022-04-02 DIAGNOSIS — I2583 Coronary atherosclerosis due to lipid rich plaque: Secondary | ICD-10-CM

## 2022-04-02 DIAGNOSIS — I2510 Atherosclerotic heart disease of native coronary artery without angina pectoris: Secondary | ICD-10-CM | POA: Diagnosis not present

## 2022-04-02 NOTE — Plan of Care (Signed)
  Problem: Education: Goal: Knowledge of General Education information will improve Description: Including pain rating scale, medication(s)/side effects and non-pharmacologic comfort measures Outcome: Progressing   Problem: Health Behavior/Discharge Planning: Goal: Ability to manage health-related needs will improve Outcome: Progressing   Problem: Clinical Measurements: Goal: Will remain free from infection Outcome: Progressing   Problem: Nutrition: Goal: Adequate nutrition will be maintained Outcome: Progressing   Problem: Safety: Goal: Ability to remain free from injury will improve Outcome: Progressing

## 2022-04-02 NOTE — Progress Notes (Addendum)
Rounding Note    Patient Name: Alice [name] Date of Encounter: 04/02/2022  [place] Cardiologist: Freada Bergeron, MD    Subjective    89 yo admitted with severe CP Has severe disease at cath  Is now s/p CABG  Echo from Jan. 29 shows normal LV systolic function with EF 55-60%.  Doing well.  Denies any anginal CP or SOB.  Telemetry shows NSR  Inpatient Medications    Scheduled Meds:  aspirin  324 mg Oral Daily   enoxaparin (LOVENOX) injection  30 mg Subcutaneous QHS   furosemide  40 mg Oral Daily   levothyroxine  112 mcg Per Tube q AM   lisinopril  40 mg Oral Daily   metoprolol tartrate  50 mg Oral BID   omega-3 acid ethyl esters  1 g Oral Daily   pantoprazole  40 mg Oral Daily   potassium chloride SA  30 mEq Oral BID   rosuvastatin  20 mg Oral Daily   sodium chloride flush  3 mL Intravenous Q12H   Continuous Infusions:  sodium chloride     PRN Meds: sodium chloride, alum & mag hydroxide-simeth, magnesium hydroxide, menthol-cetylpyridinium, metoprolol tartrate, ondansetron (ZOFRAN) IV, mouth rinse, sodium chloride flush, traMADol, white petrolatum   Vital Signs    Vitals:   04/01/22 1935 04/01/22 2300 04/02/22 0432 04/02/22 0607  BP: 133/63 117/60 (!) 142/78   Pulse: 67 61 63   Resp: '20 20 18   '$ Temp: 97.9 F (36.6 C) 98 F (36.7 C) 98 F (36.7 C)   TempSrc: Oral Oral Oral   SpO2: 92% 97% 97%   Weight:    56.6 kg  Height:        Intake/Output Summary (Last 24 hours) at 04/02/2022 0913 Last data filed at 04/01/2022 1935 Gross per 24 hour  Intake 120 ml  Output --  Net 120 ml       04/02/2022    6:07 AM 04/01/2022    7:00 AM 03/31/2022    5:01 AM  Last 3 Weights  Weight (lbs) 124 lb 12.5 oz 125 lb 3.5 oz 124 lb 5.4 oz  Weight (kg) 56.6 kg 56.8 kg 56.4 kg      Telemetry    NSR - Personally Reviewed  ECG     No new EKG to review- Personally Reviewed  Physical Exam   GEN: Well nourished, well developed in no acute  distress HEENT: Normal NECK: No JVD; No carotid bruits LYMPHATICS: No lymphadenopathy CARDIAC:RRR, no murmurs, rubs, gallops RESPIRATORY:  few crackles at bases ABDOMEN: Soft, non-tender, non-distended MUSCULOSKELETAL:  trace B/L LE edema; No deformity  SKIN: Warm and dry NEUROLOGIC:  Alert and oriented x 3 PSYCHIATRIC:  Normal affect  Labs    High Sensitivity Troponin:   Recent Labs  Lab 03/19/22 0628 03/19/22 0815  TROPONINIHS 116* 126*      Chemistry Recent Labs  Lab 03/27/22 0650 03/29/22 0858  NA 139 137  K 3.7 4.0  CL 92* 94*  CO2 37* 32  GLUCOSE 110* 113*  BUN 18 19  CREATININE 0.82  0.76 0.92  CALCIUM 8.6* 8.8*  GFRNONAA >60  >60 60*  ANIONGAP 10 11     Lipids No results for input(s): "CHOL", "TRIG", "HDL", "LABVLDL", "LDLCALC", "CHOLHDL" in the last 168 hours.  Hematology No results for input(s): "WBC", "RBC", "HGB", "HCT", "MCV", "MCH", "MCHC", "RDW", "PLT" in the last 168 hours.  Thyroid No results for input(s): "TSH", "FREET4" in the last 168  hours.  BNPNo results for input(s): "BNP", "PROBNP" in the last 168 hours.  DDimer No results for input(s): "DDIMER" in the last 168 hours.   Radiology    No results found.  Cardiac Studies    2D echo 03/19/22 IMPRESSIONS    1. Left ventricular ejection fraction, by estimation, is 60 to 65%. The  left ventricle has normal function. The left ventricle has no regional  wall motion abnormalities. Left ventricular diastolic parameters are  consistent with Grade I diastolic  dysfunction (impaired relaxation).   2. Right ventricular systolic function is normal. The right ventricular  size is normal. There is mildly elevated pulmonary artery systolic  pressure.   3. Left atrial size was mildly dilated.   4. Large calcium deposit on PMVL.Marland Kitchen The mitral valve is degenerative. Mild  mitral valve regurgitation. No evidence of mitral stenosis. Moderate  mitral annular calcification.   5. The aortic valve is  normal in structure. Aortic valve regurgitation is  not visualized. Aortic valve sclerosis is present, with no evidence of  aortic valve stenosis. Aortic valve area, by VTI measures 1.44 cm. Aortic  valve mean gradient measures 8.0  mmHg. Aortic valve Vmax measures 1.81 m/s.   6. The inferior vena cava is dilated in size with >50% respiratory  variability, suggesting right atrial pressure of 8 mmHg.   2D echo 03/26/2022 IMPRESSIONS    1. Left ventricular ejection fraction, by estimation, is 55 to 60%. The  left ventricle has normal function. The left ventricle has no regional  wall motion abnormalities. Left ventricular diastolic parameters are  indeterminate.   2. Right ventricular systolic function is mildly reduced. The right  ventricular size is normal. There is mildly elevated pulmonary artery  systolic pressure. The estimated right ventricular systolic pressure is  95.6 mmHg.   3. The mitral valve is degenerative. Moderate mitral annular  calcification. Mitral valve not fully assessed.   4. The aortic valve is tricuspid. There is moderate calcification of the  aortic valve. Aortic valve not fully assessed.   5. The inferior vena cava is normal in size with <50% respiratory  variability, suggesting right atrial pressure of 8 mmHg.   6. Limited echo for LV function.  Patient Profile     89 y.o. female  with a hx of HLD, prior tobacco abuse, hypothyroidism and history of breast cancer who is being seen 03/19/2022 for the evaluation of chest pain and elevated troponin at the request of Dr. Gustavus Messing.   Assessment & Plan      1. ASCAD -s/p CABG -doing well -hemodynamically stable -no arrhythmias on tele -continue ASA '324mg'$  daily, Lopressor '50mg'$  BID, Crestor '20mg'$  daily   2.  Hypertension -BP borderline controlled at 142/68mHg this am -continue Lopressor '50mg'$  BID and Lisinopril '40mg'$  daily -if BP remains elevated as outpt then consider changing Lisinopril to Irbesartan  3.   Tachyarrhythmia -ressoved -continue Lopressor '50mg'$  BID  4.  HLD -LDL goal < 70 -LDL 150 this admit -continue Crestor '20mg'$  daily>>started this admit -will need FLP and ALT in 6 weeks  CARDIOLOGY RECOMMENDATIONS:  Discharge is anticipated in the next 48 hours. Recommendations for medications and follow up:  [facility]ations: [facility] Exceptions to the above:N/A  Follow Up: The patient's Primary Cardiologist is HFreada Bergeron MD  Follow up in the office in 2 week(s). Will need FLP and ALT in 6 weeks  Signed,  TFransico Him MD  9:19 AM 04/02/2022  [place]    For questions or updates, please contact [place] Please consult [email or web address] for contact info under        Signed, Fransico Him, MD  04/02/2022, 9:13 AM

## 2022-04-02 NOTE — TOC Progression Note (Signed)
Transition of Care [facility]) - Progression Note    Patient Details  Name: Alice [name] MRN: [id number] Date of Birth: [birth year]  [facility]) CM/SW [place], [place] Phone Number: 04/02/2022, 10:28 AM  Clinical Narrative:      CSW spoke with patient's son,Greg- informed of the only bed offer patient has at this time. Informed referrals were sent to several  SNFs. He will review and call CSW back.  Thurmond Butts, MSW, LCSW Clinical Social Worker     Barriers to Discharge: Ship broker, SNF Pending bed offer  Expected Discharge Plan and Services In-house Referral: Clinical Social Work                                             Social Determinants of Health (SDOH) Interventions SDOH Screenings   Food Insecurity: No Food Insecurity (10/14/2017)  Transportation Needs: No Transportation Needs (10/14/2017)  Alcohol Screen: Low Risk  (05/01/2018)  Depression (PHQ2-9): Medium Risk (02/01/2020)  Financial Resource Strain: Low Risk  (10/14/2017)  Physical Activity: Sufficiently Active (10/14/2017)  Social Connections: Somewhat Isolated (10/14/2017)  Stress: Stress Concern Present (10/14/2017)  Tobacco Use: Medium Risk (03/26/2022)    Readmission Risk Interventions     No data to display

## 2022-04-02 NOTE — TOC Progression Note (Signed)
Transition of Care [facility]) - Progression Note    Patient Details  Name: Alice [name] MRN: [id number] Date of Birth: 01-14-1931  Transition of Care [facility]) CM/SW [place], [place] Phone Number: 04/02/2022, 6:19 PM  Clinical Narrative:      [facility] confirmed bed offer- CSW started insurance authorization - reference [id number].  Patient will need updated PT note since she was last seen 2/1.   TOC will continue to follow and assist with discharge planning.  Thurmond Butts, MSW, LCSW Clinical Social Worker     Barriers to Discharge: Ship broker, SNF Pending bed offer  Expected Discharge Plan and Services In-house Referral: Clinical Social Work                                             Social Determinants of Health (SDOH) Interventions SDOH Screenings   Food Insecurity: No Food Insecurity (10/14/2017)  Transportation Needs: No Transportation Needs (10/14/2017)  Alcohol Screen: Low Risk  (05/01/2018)  Depression (PHQ2-9): Medium Risk (02/01/2020)  Financial Resource Strain: Low Risk  (10/14/2017)  Physical Activity: Sufficiently Active (10/14/2017)  Social Connections: Somewhat Isolated (10/14/2017)  Stress: Stress Concern Present (10/14/2017)  Tobacco Use: Medium Risk (03/26/2022)    Readmission Risk Interventions     No data to display

## 2022-04-02 NOTE — Progress Notes (Signed)
Mobility Specialist Progress Note:   04/02/22 1554  Mobility  Activity Ambulated with assistance in hallway  Level of Assistance Standby assist, set-up cues, supervision of patient - no hands on  Assistive Device Front wheel walker  Distance Ambulated (ft) 80 ft  Activity Response Tolerated well  Mobility Referral Yes  $Mobility charge 1 Mobility   Pt received ambulating in room with son and agreeable. C/o fatigue throughout. Pt returned to bed with all needs met, call bell in reach, and son in room.   Andrey Campanile Mobility Specialist Please contact via SecureChat or  Rehab office at [phone]

## 2022-04-02 NOTE — TOC Progression Note (Signed)
Transition of Care [facility]) - Progression Note    Patient Details  Name: Alice [name] MRN: [id number] Date of Birth: [birth year]  Transition of Care [facility] [place]) CM/SW Contact  Vinie Sill, [place] Phone Number: 04/02/2022, 1:54 PM  Clinical Narrative:     Received call form patient's son, Alice [name], he has accepted bed offer with [facility]  CSW left vice message w/Michelle @ [facility] - requested return call.   Thurmond Butts, MSW, LCSW Clinical Social Worker      Barriers to Discharge: Ship broker, SNF Pending bed offer  Expected Discharge Plan and Services In-house Referral: Clinical Social Work                                             Social Determinants of Health (SDOH) Interventions SDOH Screenings   Food Insecurity: No Food Insecurity (10/14/2017)  Transportation Needs: No Transportation Needs (10/14/2017)  Alcohol Screen: Low Risk  (05/01/2018)  Depression (PHQ2-9): Medium Risk (02/01/2020)  Financial Resource Strain: Low Risk  (10/14/2017)  Physical Activity: Sufficiently Active (10/14/2017)  Social Connections: Somewhat Isolated (10/14/2017)  Stress: Stress Concern Present (10/14/2017)  Tobacco Use: Medium Risk (03/26/2022)    Readmission Risk Interventions     No data to display

## 2022-04-02 NOTE — Progress Notes (Signed)
CARDIAC REHAB PHASE I   PRE:  Rate/Rhythm: 64 SR  BP:  Sitting: 132/76      SaO2: 94 RA  MODE:  Ambulation: 140 ft   POST:  Rate/Rhythm: 68 SR  BP:  Sitting: 145/69      SaO2: 93 RA  Pt tolerated exercise well and amb 140 ft with  RW , and independently. Pt denies CP, SOB, or dizziness throughout walk.   [place], RRT, BSRT 04/02/2022 9:49 AM

## 2022-04-02 NOTE — Progress Notes (Addendum)
      [address]       [place] [address]             [phone]        14 Days Post-Op Procedure(s) (LRB): CORONARY ARTERY BYPASS GRAFTING (CABG) X TWO, USING LEFT INTERNAL MAMMARY ARTERY AND RIGHT LEG GREATER SAPHENOUS VEIN HARVESTED ENDOSCOPICALLY (N/A) TRANSESOPHAGEAL ECHOCARDIOGRAM (N/A)  Subjective: Patient sleeping and briefly awakened. She states her "back and but hurt". She is also cold this am so I gave her additional blankets  Objective: Vital signs in last 24 hours: Temp:  [97.4 F (36.3 C)-98 F (36.7 C)] 98 F (36.7 C) (02/05 0432) Pulse Rate:  [59-67] 63 (02/05 0432) Cardiac Rhythm: Normal sinus rhythm (02/04 1953) Resp:  [18-22] 18 (02/05 0432) BP: (117-153)/(60-78) 142/78 (02/05 0432) SpO2:  [92 %-97 %] 97 % (02/05 0432) Weight:  [56.6 kg] 56.6 kg (02/05 0607)  Pre op weight  54.4 kg Current Weight  04/02/22 56.6 kg      Intake/Output from previous day: 02/04 0701 - 02/05 0700 In: 120 [P.O.:120] Out: -    Physical Exam:  Cardiovascular: RRR Pulmonary: Clear to auscultation bilaterally Abdomen: Soft, non tender, bowel sounds present. Extremities: Trace bilateral lower extremity edema R>L Ecchymosis right thigh Wounds: Clean and dry.  No erythema or signs of infection.  Lab Results: CBC: No results for input(s): "WBC", "HGB", "HCT", "PLT" in the last 72 hours.  BMET:  No results for input(s): "NA", "K", "CL", "CO2", "GLUCOSE", "BUN", "CREATININE", "CALCIUM" in the last 72 hours.   PT/INR:  Lab Results  Component Value Date   INR 1.5 (H) 03/19/2022   INR 0.94 09/10/2014   ABG:  INR: Will add last result for INR, ABG once components are confirmed Will add last 4 CBG results once components are confirmed  Assessment/Plan:  CV-S/p NSTEMI.  SR, first degree heart block. On Lopressor 50 mg bid and Lisinopril 20 mg daily.  Pulmonary-On room air. S/p left thoracentesis with 700 cc removed.  Encourage incentive  spirometer and flutter valve Volume overload-on Lasix 40 mg daily Expected post op blood loss anemia-Last H and H stable at 9.1 and 28.9 5. History of hypothyroidism-continue Levothyroxine 112 mcg daily 6. Mild confusion-no gross neuro deficit 7. Patient lives alone. As discussed with son again, needs SNF at discharge.   Donielle M ZimmermanPA-C 7:04 AM  Patient seen and examined, agree with above To SNF when arrangements completed  Remo Lipps C. Roxan Hockey, MD [facility] [phone]

## 2022-04-03 LAB — CREATININE, SERUM
Creatinine, Ser: 1.04 mg/dL — ABNORMAL HIGH (ref 0.44–1.00)
GFR, Estimated: 51 mL/min — ABNORMAL LOW (ref >60)

## 2022-04-03 MED ORDER — METOPROLOL TARTRATE 25 MG PO TABS
37.5000 mg | ORAL_TABLET | Freq: Two times a day (BID) | ORAL | Status: DC
  Filled 2022-04-03: qty 1

## 2022-04-03 MED ORDER — LISINOPRIL 40 MG PO TABS: 40.0000 mg | ORAL_TABLET | Freq: Every day | ORAL | Status: DC

## 2022-04-03 MED ORDER — METOPROLOL TARTRATE 37.5 MG PO TABS: 37.5000 mg | ORAL_TABLET | Freq: Two times a day (BID) | ORAL | Status: DC

## 2022-04-03 NOTE — TOC Progression Note (Signed)
Transition of Care [facility]) - Progression Note    Patient Details  Name: Alice [name] MRN: [id number] Date of Birth: 08/16/1930  Transition of Care [facility]) CM/SW Contact  Vinie Sill, [place] Phone Number: 04/03/2022, 10:37 AM  Clinical Narrative:     Sent updated PT note to [facility]- insurance remains pending at this time.   Thurmond Butts, MSW, LCSW Clinical Social Worker      Barriers to Discharge: Ship broker, SNF Pending bed offer  Expected Discharge Plan and Services In-house Referral: Clinical Social Work       Expected Discharge Date: 04/03/22                                     Social Determinants of Health (SDOH) Interventions SDOH Screenings   Food Insecurity: No Food Insecurity (10/14/2017)  Transportation Needs: No Transportation Needs (10/14/2017)  Alcohol Screen: Low Risk  (05/01/2018)  Depression (PHQ2-9): Medium Risk (02/01/2020)  Financial Resource Strain: Low Risk  (10/14/2017)  Physical Activity: Sufficiently Active (10/14/2017)  Social Connections: Somewhat Isolated (10/14/2017)  Stress: Stress Concern Present (10/14/2017)  Tobacco Use: Medium Risk (03/26/2022)    Readmission Risk Interventions     No data to display

## 2022-04-03 NOTE — Care Management Important Message (Signed)
Important Message  Patient Details  Name: Alice [name] MRN: [id number] Date of Birth: February 05, 1931   Medicare Important Message Given:  Yes     Shelda Altes 04/03/2022, 10:57 AM

## 2022-04-03 NOTE — Progress Notes (Signed)
Report given to nurse at [facility] at [phone]. Answered all questions. [facility] currently transporting patient out. Son collected all personal items and took with patient.  Daymon Larsen, RN

## 2022-04-03 NOTE — Progress Notes (Signed)
PT Cancellation Note  Patient Details Name: Alice [name] MRN: [id number] DOB: 29-Jul-1930   Cancelled Treatment:    Reason Eval/Treat Not Completed: Other (comment) currently eating breakfast, will return as soon as schedule allows for PT note for DC planning   Deniece Ree PT DPT PN2

## 2022-04-03 NOTE — Progress Notes (Signed)
Physical Therapy Treatment Patient Details Name: Alice [name] MRN: [id number] DOB: 1930-10-31 Today's Date: 04/03/2022   History of Present Illness Pt is a 89 y.o. 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 Significant PMH: HLD, prior tobacco abuse, breast cancaer, hypothyroidism.    PT Comments    Pt received up in chair, son present. Agreeable to short gait distance this morning, seemed a bit more fatigued than in prior sessions but did well although did need max cues for safety, path finding, and sternal precautions today. Still with poor safety awareness- impulsively sat in first chair available to her upon re-entering room after gait. VSS on RA. Left up in recliner with family present, would still benefit from SNF at this point.    Recommendations for follow up therapy are one component of a multi-disciplinary discharge planning process, led by the attending physician.  Recommendations may be updated based on patient status, additional functional criteria and insurance authorization.  Follow Up Recommendations  Skilled nursing-short term rehab (<3 hours/day) Can patient physically be transported by private vehicle: Yes   Assistance Recommended at Discharge Intermittent Supervision/Assistance  Patient can return home with the following Assistance with cooking/housework;Assist for transportation;Help with stairs or ramp for entrance   Equipment Recommendations  Rollator (4 wheels);Other (comment) (tub bench)    Recommendations for Other Services       Precautions / Restrictions Precautions Precautions: Sternal;Fall Precaution Booklet Issued: No Precaution Comments: max cues throughout session to recall precautions functionally Restrictions Other Position/Activity Restrictions: sternal precautions     Mobility  Bed Mobility               General bed mobility comments: up in chair pre and post session     Transfers Overall transfer level: Needs assistance Equipment used: Rolling walker (2 wheels) Transfers: Sit to/from Stand Sit to Stand: Supervision           General transfer comment: S for safety/assist with line management    Ambulation/Gait Ambulation/Gait assistance: Min guard Gait Distance (Feet): 90 Feet Assistive device: Rolling walker (2 wheels) Gait Pattern/deviations: Step-through pattern, Decreased stride length, Drifts right/left Gait velocity: decr     General Gait Details: slow gait with RW, VSS on RW but needed Max cues for pathfinding and safe hand placement on RW, declined progression of gait distance today due to fatigue   Stairs             Wheelchair Mobility    Modified Rankin (Stroke Patients Only)       Balance Overall balance assessment: Needs assistance Sitting-balance support: Feet supported, No upper extremity supported Sitting balance-Leahy Scale: Good     Standing balance support: During functional activity Standing balance-Leahy Scale: Fair                              Cognition Arousal/Alertness: Awake/alert Behavior During Therapy: WFL for tasks assessed/performed, Impulsive Overall Cognitive Status: Impaired/Different from baseline Area of Impairment: Safety/judgement, Memory, Orientation, Awareness, Problem solving                 Orientation Level: Disoriented to, Situation   Memory: Decreased recall of precautions, Decreased short-term memory   Safety/Judgement: Decreased awareness of safety, Decreased awareness of deficits Awareness: Emergent Problem Solving: Requires verbal cues, Requires tactile cues General Comments: still needs heavy cuing for adherence to sternal precautions, still a bit  internally distracted but pleasant. Poor safety awareness in general- at one point impulsively sat in the first chair she saw after entering room after gait, perseverated on being fatigued with gait         Exercises      General Comments General comments (skin integrity, edema, etc.): VSS on RA      Pertinent Vitals/Pain Pain Assessment Pain Assessment: Faces Pain Score: 0-No pain    Home Living                          Prior Function            PT Goals (current goals can now be found in the care plan section) Acute Rehab PT Goals Patient Stated Goal: go home to dog PT Goal Formulation: With patient Time For Goal Achievement: 04/07/22 Potential to Achieve Goals: Good Progress towards PT goals: Progressing toward goals    Frequency    Min 3X/week      PT Plan Current plan remains appropriate    Co-evaluation              AM-PAC PT "6 Clicks" Mobility   Outcome Measure  Help needed turning from your back to your side while in a flat bed without using bedrails?: None Help needed moving from lying on your back to sitting on the side of a flat bed without using bedrails?: A Little Help needed moving to and from a bed to a chair (including a wheelchair)?: A Little Help needed standing up from a chair using your arms (e.g., wheelchair or bedside chair)?: A Little Help needed to walk in hospital room?: A Little Help needed climbing 3-5 steps with a railing? : A Little 6 Click Score: 19    End of Session   Activity Tolerance: Patient tolerated treatment well Patient left: in chair;with call bell/phone within reach;with family/visitor present Nurse Communication: Mobility status PT Visit Diagnosis: Unsteadiness on feet (R26.81);Difficulty in walking, not elsewhere classified (R26.2)     Time: 1000-1010 PT Time Calculation (min) (ACUTE ONLY): 10 min  Charges:  $Gait Training: 8-22 mins                    Deniece Ree PT DPT PN2

## 2022-04-03 NOTE — TOC Progression Note (Signed)
Transition of Care [facility]) - Progression Note    Patient Details  Name: Alice [name] MRN: [id number] Date of Birth: August 04, 1930  Transition of Care [facility]) CM/SW [place], [place] Phone Number: 04/03/2022, 11:41 AM  Clinical Narrative:     CSW received auth for SNF- [id number] from 2/6-2/8 for [facility] .      Barriers to Discharge: Ship broker, SNF Pending bed offer  Expected Discharge Plan and Services In-house Referral: Clinical Social Work       Expected Discharge Date: 04/03/22                                     Social Determinants of Health (SDOH) Interventions SDOH Screenings   Food Insecurity: No Food Insecurity (10/14/2017)  Transportation Needs: No Transportation Needs (10/14/2017)  Alcohol Screen: Low Risk  (05/01/2018)  Depression (PHQ2-9): Medium Risk (02/01/2020)  Financial Resource Strain: Low Risk  (10/14/2017)  Physical Activity: Sufficiently Active (10/14/2017)  Social Connections: Somewhat Isolated (10/14/2017)  Stress: Stress Concern Present (10/14/2017)  Tobacco Use: Medium Risk (03/26/2022)    Readmission Risk Interventions     No data to display

## 2022-04-03 NOTE — TOC Transition Note (Signed)
Transition of Care [facility]) - CM/SW Discharge Note   Patient Details  Name: Alice [name] MRN: [id number] Date of Birth: September 29, 1930  Transition of Care [facility]) CM/SW Contact:  Vinie Sill, LCSW Phone Number: 04/03/2022, 12:30 PM   Clinical Narrative:     Patient will Discharge to: [facility]  Discharge Date: 04/03/22 Family Notified: son, Marya Amsler Transport By: Corey Harold  Per MD patient is ready for discharge. RN, patient, and facility notified of discharge. Discharge Summary sent to facility. RN given number for [phone]. Ambulance transport requested for patient.   Clinical Social Worker signing off.  Thurmond Butts, MSW, LCSW Clinical Social Worker    Final next level of care: Skilled Nursing Facility Barriers to Discharge: Barriers Resolved   Patient Goals and CMS Choice      Discharge Placement                Patient chooses bed at: [facility] Patient to be transferred to facility by: [place] Name of family member notified: son, Marya Amsler Patient and family notified of of transfer: 04/03/22  Discharge Plan and Services Additional resources added to the After Visit Summary for   In-house Referral: Clinical Social Work                                   Social Determinants of Health (SDOH) Interventions SDOH Screenings   Food Insecurity: No Food Insecurity (10/14/2017)  Transportation Needs: No Transportation Needs (10/14/2017)  Alcohol Screen: Low Risk  (05/01/2018)  Depression (PHQ2-9): Medium Risk (02/01/2020)  Financial Resource Strain: Low Risk  (10/14/2017)  Physical Activity: Sufficiently Active (10/14/2017)  Social Connections: Somewhat Isolated (10/14/2017)  Stress: Stress Concern Present (10/14/2017)  Tobacco Use: Medium Risk (03/26/2022)     Readmission Risk Interventions     No data to display

## 2022-04-03 NOTE — TOC Progression Note (Signed)
Transition of Care [facility]) - Progression Note    Patient Details  Name: Alice [name] MRN: [id number] Date of Birth: 1930-12-15  Transition of Care [facility]) CM/SW [place], [place] Phone Number: 04/03/2022, 8:12 AM  Clinical Narrative:     Insurance still pending.  Sent PT secured chat- needs recent PT note for insurance.  Thurmond Butts, MSW, LCSW Clinical Social Worker      Barriers to Discharge: Ship broker, SNF Pending bed offer  Expected Discharge Plan and Services In-house Referral: Clinical Social Work       Expected Discharge Date: 04/03/22                                     Social Determinants of Health (SDOH) Interventions SDOH Screenings   Food Insecurity: No Food Insecurity (10/14/2017)  Transportation Needs: No Transportation Needs (10/14/2017)  Alcohol Screen: Low Risk  (05/01/2018)  Depression (PHQ2-9): Medium Risk (02/01/2020)  Financial Resource Strain: Low Risk  (10/14/2017)  Physical Activity: Sufficiently Active (10/14/2017)  Social Connections: Somewhat Isolated (10/14/2017)  Stress: Stress Concern Present (10/14/2017)  Tobacco Use: Medium Risk (03/26/2022)    Readmission Risk Interventions     No data to display

## 2022-04-03 NOTE — Progress Notes (Addendum)
      [address]       [place] [address]             [phone]        15 Days Post-Op Procedure(s) (LRB): CORONARY ARTERY BYPASS GRAFTING (CABG) X TWO, USING LEFT INTERNAL MAMMARY ARTERY AND RIGHT LEG GREATER SAPHENOUS VEIN HARVESTED ENDOSCOPICALLY (N/A) TRANSESOPHAGEAL ECHOCARDIOGRAM (N/A)  Subjective: Patient sitting on edge of bed. Her only complaint is pain above right EVH wound (small hematoma).  Objective: Vital signs in last 24 hours: Temp:  [97.6 F (36.4 C)-98.4 F (36.9 C)] 97.6 F (36.4 C) (02/06 0422) Pulse Rate:  [60-74] 60 (02/05 2354) Cardiac Rhythm: Normal sinus rhythm (02/05 1901) Resp:  [19-20] 20 (02/06 0422) BP: (94-149)/(51-66) 122/58 (02/06 0422) SpO2:  [90 %-98 %] 91 % (02/06 0422)  Pre op weight  54.4 kg Current Weight  04/02/22 56.6 kg      Intake/Output from previous day: No intake/output data recorded.   Physical Exam:  Cardiovascular: RRR Pulmonary: Clear to auscultation bilaterally Abdomen: Soft, non tender, bowel sounds present. Extremities: Trace bilateral lower extremity edema R>L Ecchymosis right thigh Wounds: Clean and dry.  No erythema or signs of infection.  Lab Results: CBC: No results for input(s): "WBC", "HGB", "HCT", "PLT" in the last 72 hours.  BMET:  No results for input(s): "NA", "K", "CL", "CO2", "GLUCOSE", "BUN", "CREATININE", "CALCIUM" in the last 72 hours.   PT/INR:  Lab Results  Component Value Date   INR 1.5 (H) 03/19/2022   INR 0.94 09/10/2014   ABG:  INR: Will add last result for INR, ABG once components are confirmed Will add last 4 CBG results once components are confirmed  Assessment/Plan:  CV-S/p NSTEMI.  SR, first degree heart block. On Lopressor 50 mg bid and Lisinopril 20 mg daily. She is not receiving both doses of Lopressor 50 mg (likely secondary to heart rate). Will decrease to 37.5 mg bid and monitor BP Pulmonary-On room air. S/p left thoracentesis with 700 cc  removed.  Encourage incentive spirometer and flutter valve Volume overload-on Lasix 40 mg daily Expected post op blood loss anemia-Last H and H stable at 9.1 and 28.9 5. History of hypothyroidism-continue Levothyroxine 112 mcg daily 6. Mild confusion-no gross neuro deficit 7. Patient lives alone. As discussed with son again, needs SNF at discharge.   Donielle M ZimmermanPA-C 7:04 AM  Patient seen and examined, agree with above To SNF when available  Remo Lipps C. Roxan Hockey, MD [facility] [phone]

## 2022-04-03 NOTE — Progress Notes (Signed)
Patient BP 94/64 MAP 74. Asymptomatic. Patient has been up ambulating today prior to reading with no symptoms. Son present. PA notified.    04/03/22 1400  Vitals  BP 94/64  MAP (mmHg) 74  BP Location Right Arm  BP Method Automatic  Patient Position (if appropriate) Lying  Pulse Rate 74  Pulse Rate Source Monitor  ECG Heart Rate 68  Resp 20  Level of Consciousness  Level of Consciousness Alert  MEWS COLOR  MEWS Score Color Green  Oxygen Therapy  SpO2 98 %  O2 Device Room Air  MEWS Score  MEWS Temp 0  MEWS Systolic 1  MEWS Pulse 0  MEWS RR 0  MEWS LOC 0  MEWS Score 1

## 2022-04-07 NOTE — Progress Notes (Unsigned)
Cardiology Office Note:    Date:  04/09/2022   ID:  Alice [name], DOB [birth year], MRN [id number]  PCP:  Lauree Chandler, NP   [facility] Providers Cardiologist:  Freada Bergeron, MD     Referring MD: Lauree Chandler, NP   Chief Complaint: hospital follow-up s/p CABG  History of Present Illness:    Alice [name] is a very pleasant 89 y.o. female with a hx of HLD, prior tobacco abuse, hypothyroidism, and breast cancer who presented with a STEMI found to have critical left main disease on cath prompting urgent CABG with LIMA to LAD and SVG to OM on 03/19/2022.  She presented to ED on 03/19/22 with chest [name].  She reported progressive chest [name] over the previous few days.  About 2 months prior she began to feel discomfort in her chest.  She reported it was mild at first and she did not seek medical attention.  Over the course of the past several weeks the [name] has been progressing and became associated with shortness of breath and diaphoresis.  [name] was worse when walking up an incline. In ED, was found to have troponin 116 ? 126 with subtle ST-T and lateral leads.  CXR without acute process.  TTE with LVEF 60 to 65%, G1 DD, normal RV, mild LAE, mild MR, aortic sclerosis.  LHC 1/22 showed 99% critical LM disease prompting emergent CABG.  She had persistent left pleural effusion and atelectasis with a left thoracentesis and 700 cc of fluid removed. Follow-up CXR showed no pneumothorax, persistent small pleural effusions.  Because of consistent desaturations, she was discharged on 2L O2.  She had brief atrial tachycardia (versus a flutter 2-1 block) on Sunday 1/28 and another brief episode of atrial tachycardia and ventricular trigeminy on 1/29.  She was started on metoprolol, cardiology not felt that amiodarone was needed at that time. Was discharged to SNF on 2/6 on metoprolol 37.5 mg twice daily, lisinopril 40 mg daily, ASA 81 mg daily, and rosuvastatin 20 mg daily.  Today, she  is here alone from SNF. She is in a wheelchair. Reports she has 3 sons who live in [place] and [place]. When asked about family who come to check on her, she reports she has a friend [name] who comes by. Is looking forward to going back to her home where she has been for 10 years.  She has a stuffed dog with her today that she says is named spot.  Says her [name] terrier is with her son. Prior to hospitalization, she only took levothyroxine. No prior cardiac problems, but both mother and father had heart problems. Was a Print production planner for many years. When asked about cardiac symptoms, she says only that her chest hurts "where they did surgery."  She denies palpitations, shortness of breath, orthopnea, PND, presyncope, syncope.  No bleeding concerns. During exam, asks her stuffed dog if he bit her.   Past Medical History:  Diagnosis Date   Alopecia    Breast cancer ([address])     Right , chemo, radiation   DDD (degenerative disc disease)    Heart palpitations    History of diverticulitis    History of diverticulitis of colon    Hypercholesteremia    Hypothyroidism    Insect bites    Internal and external bleeding hemorrhoids 08/11/2015   Personal history of chemotherapy 2012   Personal history of radiation therapy 2012   Scoliosis    L4 L5  Seasonal allergies    SUI (stress urinary incontinence, female)     Past Surgical History:  Procedure Laterality Date   BLADDER SUSPENSION     bowel rescetion     BOWEL RESECTION     BREAST BIOPSY Right 03/14/2010   BREAST LUMPECTOMY  2012   right   CATARACT EXTRACTION     left   COLON SURGERY  20 YRS AGO   BOWEL RESECTION   CORONARY ARTERY BYPASS GRAFT N/A 03/19/2022   Procedure: CORONARY ARTERY BYPASS GRAFTING (CABG) X TWO, USING LEFT INTERNAL MAMMARY ARTERY AND RIGHT LEG GREATER SAPHENOUS VEIN HARVESTED ENDOSCOPICALLY;  Surgeon: Melrose Nakayama, MD;  Location: [facility];  Service: Open Heart Surgery;  Laterality: N/A;   EYE SURGERY      HEMORRHOID BANDING     HERNIA REPAIR     RIH, Dr. Rise Patience   IABP INSERTION N/A 03/19/2022   Procedure: IABP Insertion;  Surgeon: Lorretta Harp, MD;  Location: [name];  Service: Cardiovascular;  Laterality: N/A;   IR THORACENTESIS ASP PLEURAL SPACE W/IMG GUIDE  03/26/2022   JOINT REPLACEMENT     LEFT HEART CATH AND CORONARY ANGIOGRAPHY N/A 03/19/2022   Procedure: LEFT HEART CATH AND CORONARY ANGIOGRAPHY;  Surgeon: Lorretta Harp, MD;  Location: [facility];  Service: Cardiovascular;  Laterality: N/A;   PORT-A-CATH REMOVAL     TEE WITHOUT CARDIOVERSION N/A 03/19/2022   Procedure: TRANSESOPHAGEAL ECHOCARDIOGRAM;  Surgeon: Melrose Nakayama, MD;  Location: [facility];  Service: Open Heart Surgery;  Laterality: N/A;   TONSILLECTOMY  2001   Diverticulitis   TOTAL HIP ARTHROPLASTY Right 09/10/2014   Procedure: RIGHT TOTAL HIP ARTHROPLASTY ANTERIOR APPROACH;  Surgeon: Mcarthur Rossetti, MD;  Location: [facility];  Service: Orthopedics;  Laterality: Right;    Current Medications: Current Meds  Medication Sig   aspirin 81 MG chewable tablet Chew 4 tablets (324 mg total) by mouth daily.   fish oil-omega-3 fatty acids 1000 MG capsule Take 1 g by mouth daily.    levothyroxine (SYNTHROID) 112 MCG tablet Take 112 mcg by mouth in the morning.   Metoprolol Tartrate 37.5 MG TABS Take 1 tablet by mouth 2 (two) times daily. Hold for BP < than 100   Multiple Vitamin (MULTIVITAMIN) tablet Take 1 tablet by mouth daily.   traMADol (ULTRAM) 50 MG tablet Take 1 tablet (50 mg total) by mouth every 6 (six) hours as needed for moderate [name].     Allergies:   Amoxicillin, Diphenhydramine, Naproxen sodium, Lipitor [atorvastatin], Meclizine, Amlodipine, Cefdinir, Codeine, Ferrous sulfate, Methocarbamol, Prednisone, Skelaxin [metaxalone], Tramadol, and Hctz [hydrochlorothiazide]   Social History   Socioeconomic History   Marital status: Divorced    Spouse name: Not on file   Number of  children: 3   Years of education: Not on file   Highest education level: Not on file  Occupational History   Occupation: Retired    Fish farm manager: RETIRED  Tobacco Use   Smoking status: Former    Types: Cigarettes    Quit date: 08/09/1964    Years since quitting: 57.7   Smokeless tobacco: Never  Vaping Use   Vaping Use: Never used  Substance and Sexual Activity   Alcohol use: Yes    Comment: RARE   Drug use: No   Sexual activity: Not Currently  Other Topics Concern   Not on file  Social History Narrative   Tobacco use, amount per day now: N/A      Past tobacco  use, amount per day: 1 pack, Quit 1969      How many years did you use tobacco: N/A      Alcohol use (drinks per week): Occassionally. Glass of wine- Holidays      Diet: N/A      Do you drink/eat things with caffeine? Coffee and Tea      Marital status: Divorced             What year were you married?      Do you live in a house, apartment, assisted living, condo, trailer? Condo      Is it one or more stories? One      How many persons live in your home? Self and Service Dog      Do you have any pets in your home? 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 Walking            How often? Usually daily 2-4 times      Do you have a living will? Yes      Do you have a DNR form? N/A            If not, do you want to discuss one? N/A      Do you have signed POA/HPOA forms? No                Social Determinants of Health   Financial Resource Strain: Low Risk  (10/14/2017)   Overall Financial Resource Strain (CARDIA)    Difficulty of Paying Living Expenses: Not hard at all  Food Insecurity: No Food Insecurity (10/14/2017)   Hunger Vital Sign    Worried About Running Out of Food in the Last Year: Never true    Ran Out of Food in the Last Year: Never true  Transportation Needs: No Transportation Needs (10/14/2017)   PRAPARE - Radiographer, therapeutic (Medical): No    Lack of Transportation (Non-Medical): No  Physical Activity: Sufficiently Active (10/14/2017)   Exercise Vital Sign    Days of Exercise per Week: 7 days    Minutes of Exercise per Session: 90 min  Stress: Stress Concern Present (10/14/2017)   [place]    Feeling of Stress : To some extent  Social Connections: Somewhat Isolated (10/14/2017)   Social Connection and Isolation Panel [NHANES]    Frequency of Communication with Friends and Family: More than three times a week    Frequency of Social Gatherings with Friends and Family: Three times a week    Attends Religious Services: Never    Active Member of Clubs or Organizations: Yes    Attends Music therapist: More than 4 times per year    Marital Status: Divorced     Family History: The patient's family history includes Asthma in her son, son, and son; CVA (age of onset: 67) in her father; Cancer (age of onset: 18) in her sister; Colon cancer in an other family member; Heart disease in her father and mother; Skin cancer (age of onset: 32) in her brother.  ROS:   Please see the history of present illness.   All other systems reviewed and are negative.  Labs/Other Studies Reviewed:    The following studies were reviewed today:  LHC 03/19/22   Mid LM to Dist LM lesion is 99%  stenosed.   Prox RCA lesion is 90% stenosed.   Mid LAD lesion is 60% stenosed.  IMPRESSION: Ms. Gilgenbach has left main/three-vessel disease.  She is having active angina.  I placed an intra-aortic balloon pump.  I bolused her with 7000 units of heparin and placed her on a heparin drip.  Dr. Roxan Hockey has seen her and has agreed to take her for coronary artery bypass graft grafting urgently.   Diagnostic Dominance: Right  Intervention   Recent Labs: 03/24/2022: Hemoglobin 9.1; Platelets 202 03/26/2022: Magnesium 1.8 03/29/2022: BUN 19; Potassium 4.0;  Sodium 137 04/03/2022: Creatinine, Ser 1.04  Recent Lipid Panel    Component Value Date/Time   CHOL 238 (H) 03/19/2022 0815   TRIG 42 03/19/2022 0815   HDL 80 03/19/2022 0815   CHOLHDL 3.0 03/19/2022 0815   VLDL 8 03/19/2022 0815   LDLCALC 150 (H) 03/19/2022 0815   LDLCALC 133 (H) 02/01/2020 1109     Risk Assessment/Calculations:      Physical Exam:    VS:  BP 100/60   Pulse (!) 58   Ht 5' 2"$  (1.575 m)   Wt 110 lb (49.9 kg)   BMI 20.12 kg/m     Wt Readings from Last 3 Encounters:  04/09/22 110 lb (49.9 kg)  04/03/22 116 lb 10 oz (52.9 kg)  02/01/20 128 lb 12.8 oz (58.4 kg)     GEN:  Thin, chronically ill appearing in no acute distress HEENT: Normal NECK: No JVD; No carotid bruits CARDIAC: RRR, no murmurs, rubs, gallops RESPIRATORY:  Clear to auscultation without rales, wheezing or rhonchi  ABDOMEN: Soft, non-tender, non-distended MUSCULOSKELETAL:  No edema; No deformity. 2+ pedal pulses, equal bilaterally SKIN: Warm and dry. Sternotomy incision has well approximated edges and is clean and dry NEUROLOGIC:  Alert and oriented, answers some questions appropriately but is not aware of some details of current or past history PSYCHIATRIC:  Normal affect   EKG:  EKG is ordered today.  The ekg ordered today demonstrates sinus bradycardia at 58 bpm with PVC, incomplete RBBB   Diagnoses:    1. S/P CABG x 2   2. Coronary artery disease involving native coronary artery of native heart without angina pectoris   3. Essential hypertension   4. Hyperlipidemia LDL goal <70   5. Medication management    Assessment and Plan:     CAD s/p CABG: S/p NSTEMI with critical left main disease requiring IABP placement who underwent CABG x 2 03/19/22. She denies chest [name], dyspnea, or other symptoms concerning for angina. Has minor discomfort along sternotomy. Wound is well-approximated and is clean and dry. No indication for further ischemic evaluation at this time. Weight reported from  SNF is stable.  Continue aspirin, metoprolol, rosuvastatin.   Medication management: Per review of SNF records, metoprolol is held for SBP < 100. She reports no side effects. Does not know the specifics of her medications.   Hyperlipidemia LDL goal < 70: Normal lipoprotein a, LDL 150. Will recheck prior to next office visit for her convenience. Continue rosuvastatin.   Hypertension: BP is well controlled with readings from SNF that are also well controlled.    Cardiac Rehabilitation Eligibility Assessment  The patient has declined or is not appropriate for cardiac rehabilitation.     Disposition: 3 months with Dr. Johney Frame or me  Medication Adjustments/Labs and Tests Ordered: Current medicines are reviewed at length with the patient today.  Concerns regarding medicines are outlined above.  Orders Placed This Encounter  Procedures  CBC   Basic Metabolic Panel (BMET)   Lipid panel   EKG 12-Lead   No orders of the defined types were placed in this encounter.   Patient Instructions  Medication Instructions:  Your physician recommends that you continue on your current medications as directed. Please refer to the Current Medication list given to you today. *If you need a refill on your cardiac medications before your next appointment, please call your pharmacy*   Lab Work: TODAY-CBC & BMET LIPIDS at next appt  If you have labs (blood work) drawn today and your tests are completely normal, you will receive your results only by: [place] (if you have MyChart) OR A paper copy in the mail If you have any lab test that is abnormal or we need to change your treatment, we will call you to review the results.   Testing/Procedures: None ordered   Follow-Up: At [facility], you and your health needs are our priority.  As part of our continuing mission to provide you with exceptional heart care, we have created designated Provider Care Teams.  These Care Teams  include your primary Cardiologist (physician) and Advanced Practice Providers (APPs -  Physician Assistants and Nurse Practitioners) who all work together to provide you with the care you need, when you need it.  We recommend signing up for the patient portal called "MyChart".  Sign up information is provided on this After Visit Summary.  MyChart is used to connect with patients for Virtual Visits (Telemedicine).  Patients are able to view lab/test results, encounter notes, upcoming appointments, etc.  Non-urgent messages can be sent to your provider as well.   To learn more about what you can do with MyChart, go to [email or web address].    Your next appointment:   3 month(s)  Provider:   Freada Bergeron, MD     Other Instructions     Signed, Emmaline Life, NP  04/09/2022 [place]

## 2022-04-09 ENCOUNTER — Encounter: Payer: Self-pay | Admitting: Nurse Practitioner

## 2022-04-09 ENCOUNTER — Ambulatory Visit: Payer: TRICARE For Life (TFL) | Attending: Nurse Practitioner | Admitting: Nurse Practitioner

## 2022-04-09 VITALS — BP 100/60 | HR 58 | Ht 62.0 in | Wt 110.0 lb

## 2022-04-09 DIAGNOSIS — Z951 Presence of aortocoronary bypass graft: Secondary | ICD-10-CM | POA: Diagnosis not present

## 2022-04-09 DIAGNOSIS — E785 Hyperlipidemia, unspecified: Secondary | ICD-10-CM | POA: Diagnosis not present

## 2022-04-09 DIAGNOSIS — Z79899 Other long term (current) drug therapy: Secondary | ICD-10-CM

## 2022-04-09 DIAGNOSIS — I10 Essential (primary) hypertension: Secondary | ICD-10-CM

## 2022-04-09 DIAGNOSIS — I2510 Atherosclerotic heart disease of native coronary artery without angina pectoris: Secondary | ICD-10-CM

## 2022-04-09 LAB — CBC

## 2022-04-09 NOTE — Patient Instructions (Signed)
Medication Instructions:  Your physician recommends that you continue on your current medications as directed. Please refer to the Current Medication list given to you today. *If you need a refill on your cardiac medications before your next appointment, please call your pharmacy*   Lab Work: TODAY-CBC & BMET LIPIDS at next appt  If you have labs (blood work) drawn today and your tests are completely normal, you will receive your results only by: [place] (if you have MyChart) OR A paper copy in the mail If you have any lab test that is abnormal or we need to change your treatment, we will call you to review the results.   Testing/Procedures: None ordered   Follow-Up: At [facility], you and your health needs are our priority.  As part of our continuing mission to provide you with exceptional heart care, we have created designated Provider Care Teams.  These Care Teams include your primary Cardiologist (physician) and Advanced Practice Providers (APPs -  Physician Assistants and Nurse Practitioners) who all work together to provide you with the care you need, when you need it.  We recommend signing up for the patient portal called "MyChart".  Sign up information is provided on this After Visit Summary.  MyChart is used to connect with patients for Virtual Visits (Telemedicine).  Patients are able to view lab/test results, encounter notes, upcoming appointments, etc.  Non-urgent messages can be sent to your provider as well.   To learn more about what you can do with MyChart, go to [email or web address].    Your next appointment:   3 month(s)  Provider:   Freada Bergeron, MD     Other Instructions

## 2022-04-10 LAB — CBC
Hematocrit: 38.1 % (ref 34.0–46.6)
Hemoglobin: 12.1 g/dL (ref 11.1–15.9)
MCH: 29.4 pg (ref 26.6–33.0)
MCHC: 31.8 g/dL (ref 31.5–35.7)
MCV: 93 fL (ref 79–97)
Platelets: 369 10*3/uL (ref 150–450)
RBC: 4.12 x10E6/uL (ref 3.77–5.28)
RDW: 16.9 % — ABNORMAL HIGH (ref 11.7–15.4)
WBC: 8.3 10*3/uL (ref 3.4–10.8)

## 2022-04-10 LAB — BASIC METABOLIC PANEL
BUN/Creatinine Ratio: 21 (ref 12–28)
BUN: 22 mg/dL (ref 10–36)
CO2: 26 mmol/L (ref 20–29)
Calcium: 9.3 mg/dL (ref 8.7–10.3)
Chloride: 103 mmol/L (ref 96–106)
Creatinine, Ser: 1.05 mg/dL — ABNORMAL HIGH (ref 0.57–1.00)
Glucose: 108 mg/dL — ABNORMAL HIGH (ref 70–99)
Potassium: 4.5 mmol/L (ref 3.5–5.2)
Sodium: 142 mmol/L (ref 134–144)
eGFR: 50 mL/min/{1.73_m2} — ABNORMAL LOW (ref >59)

## 2022-04-11 ENCOUNTER — Encounter: Payer: Self-pay | Admitting: Adult Health

## 2022-04-13 ENCOUNTER — Encounter: Payer: Self-pay | Admitting: Adult Health

## 2022-04-17 ENCOUNTER — Ambulatory Visit: Payer: TRICARE For Life (TFL)

## 2022-04-17 ENCOUNTER — Ambulatory Visit (INDEPENDENT_AMBULATORY_CARE_PROVIDER_SITE_OTHER): Payer: Medicare PPO | Admitting: Adult Health

## 2022-04-17 ENCOUNTER — Encounter: Payer: Self-pay | Admitting: Adult Health

## 2022-04-17 VITALS — BP 128/84 | Temp 97.0°F | Ht 62.0 in | Wt 129.4 lb

## 2022-04-17 DIAGNOSIS — I2510 Atherosclerotic heart disease of native coronary artery without angina pectoris: Secondary | ICD-10-CM

## 2022-04-17 DIAGNOSIS — E039 Hypothyroidism, unspecified: Secondary | ICD-10-CM | POA: Diagnosis not present

## 2022-04-17 DIAGNOSIS — I700 Atherosclerosis of aorta: Secondary | ICD-10-CM

## 2022-04-17 DIAGNOSIS — I25118 Atherosclerotic heart disease of native coronary artery with other forms of angina pectoris: Secondary | ICD-10-CM

## 2022-04-17 DIAGNOSIS — I214 Non-ST elevation (NSTEMI) myocardial infarction: Secondary | ICD-10-CM | POA: Diagnosis not present

## 2022-04-17 MED ORDER — FUROSEMIDE 20 MG PO TABS: 20.0000 mg | ORAL_TABLET | Freq: Every day | ORAL | 3 refills | Status: DC

## 2022-04-17 NOTE — Progress Notes (Signed)
Location:  [facility] of Service:   clinic    CODE STATUS:   Allergies  Allergen Reactions   Amoxicillin Rash   Diphenhydramine Rash    "It is the red dye in Benadryl that I''m allergic to"   Naproxen Sodium Rash    "It is the blue dye in naproxen that I'm allergic to."   Lipitor [Atorvastatin]     Unknown reaction   Meclizine Other (See Comments)    Worsening dizziness and blurred vision   Amlodipine Other (See Comments)    Dizzy   Cefdinir Other (See Comments)    Vertigo and headaches    Codeine Other (See Comments)    unknown   Ferrous Sulfate     "tears belly up"   Methocarbamol Other (See Comments)    insomnia    Prednisone Other (See Comments)    vertigo and headaches   Skelaxin [Metaxalone] Other (See Comments)    Gastric upset.    Tramadol Nausea Only   Hctz [Hydrochlorothiazide] Itching    Chief Complaint  Patient presents with   Hospitalization Follow-up   Acute Visit    Patient presents today for bilateral ankle/ leg swelling.    HPI:  She had been hospitalized for an emergent cardiac cath with a 2 graft bypass. She then proceeded to SNF for therapy. She lives by herself with her dog. Her neighbor does check on her. Her sons do not live close by. She does have some lower extremity edema. She has completed her lasix. She is disoriented and can only focus on her dog.   Past Medical History:  Diagnosis Date   Alopecia    Breast cancer ([place])     Right , chemo, radiation   DDD (degenerative disc disease)    Heart palpitations    History of diverticulitis    History of diverticulitis of colon    Hypercholesteremia    Hypothyroidism    Insect bites    Internal and external bleeding hemorrhoids 08/11/2015   Personal history of chemotherapy 2012   Personal history of radiation therapy 2012   Scoliosis    L4 L5   Seasonal allergies    SUI (stress urinary incontinence, female)     Past Surgical History:  Procedure Laterality  Date   BLADDER SUSPENSION     bowel rescetion     BOWEL RESECTION     BREAST BIOPSY Right 03/14/2010   BREAST LUMPECTOMY  2012   right   CATARACT EXTRACTION     left   COLON SURGERY  20 YRS AGO   BOWEL RESECTION   CORONARY ARTERY BYPASS GRAFT N/A 03/19/2022   Procedure: CORONARY ARTERY BYPASS GRAFTING (CABG) X TWO, USING LEFT INTERNAL MAMMARY ARTERY AND RIGHT LEG GREATER SAPHENOUS VEIN HARVESTED ENDOSCOPICALLY;  Surgeon: Melrose Nakayama, MD;  Location: [facility];  Service: Open Heart Surgery;  Laterality: N/A;   EYE SURGERY     HEMORRHOID BANDING     HERNIA REPAIR     RIH, Dr. Rise Patience   IABP INSERTION N/A 03/19/2022   Procedure: IABP Insertion;  Surgeon: Lorretta Harp, MD;  Location: [facility];  Service: Cardiovascular;  Laterality: N/A;   IR THORACENTESIS ASP PLEURAL SPACE W/IMG GUIDE  03/26/2022   JOINT REPLACEMENT     LEFT HEART CATH AND CORONARY ANGIOGRAPHY N/A 03/19/2022   Procedure: LEFT HEART CATH AND CORONARY ANGIOGRAPHY;  Surgeon: Lorretta Harp, MD;  Location: [facility];  Service:  Cardiovascular;  Laterality: N/A;   PORT-A-CATH REMOVAL     TEE WITHOUT CARDIOVERSION N/A 03/19/2022   Procedure: TRANSESOPHAGEAL ECHOCARDIOGRAM;  Surgeon: Mel[place] Nakayama, MD;  Location: [facility];  Service: Open Heart Surgery;  Laterality: N/A;   TONSILLECTOMY  2001   Diverticulitis   TOTAL HIP ARTHROPLASTY Right 09/10/2014   Procedure: RIGHT TOTAL HIP ARTHROPLASTY ANTERIOR APPROACH;  Surgeon: Mcarthur Rossetti, MD;  Location: [facility];  Service: Orthopedics;  Laterality: Right;    Social History   Socioeconomic History   Marital status: Divorced    Spouse name: Not on file   Number of children: 3   Years of education: Not on file   Highest education level: Not on file  Occupational History   Occupation: Retired    Fish farm manager: RETIRED  Tobacco Use   Smoking status: Former    Types: Cigarettes    Quit date: 08/09/1964    Years since quitting: 57.7    Smokeless tobacco: Never  Vaping Use   Vaping Use: Never used  Substance and Sexual Activity   Alcohol use: Yes    Comment: RARE   Drug use: No   Sexual activity: Not Currently  Other Topics Concern   Not on file  Social History Narrative   Tobacco use, amount per day now: N/A      Past tobacco use, amount per day: 1 pack, Quit 1969      How many years did you use tobacco: N/A      Alcohol use (drinks per week): Occassionally. Glass of wine- Holidays      Diet: N/A      Do you drink/eat things with caffeine? Coffee and Tea      Marital status: Divorced             What year were you married?      Do you live in a house, apartment, assisted living, condo, trailer? Condo      Is it one or more stories? One      How many persons live in your home? Self and Service Dog      Do you have any pets in your home? 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 Walking            How often? Usually daily 2-4 times      Do you have a living will? Yes      Do you have a DNR form? N/A            If not, do you want to discuss one? N/A      Do you have signed POA/HPOA forms? No                Social Determinants of Health   Financial Resource Strain: Low Risk  (10/14/2017)   Overall Financial Resource Strain (CARDIA)    Difficulty of Paying Living Expenses: Not hard at all  Food Insecurity: No Food Insecurity (10/14/2017)   Hunger Vital Sign    Worried About Running Out of Food in the Last Year: Never true    Ran Out of Food in the Last Year: Never true  Transportation Needs: No Transportation Needs (10/14/2017)   PRAPARE - Hydrologist (Medical): No    Lack of Transportation (Non-Medical): No  Physical Activity: Sufficiently Active (10/14/2017)   Exercise Vital Sign  Days of Exercise per Week: 7 days    Minutes of Exercise per Session: 90 min  Stress: Stress Concern Present (10/14/2017)    [place]    Feeling of Stress : To some extent  Social Connections: Somewhat Isolated (10/14/2017)   Social Connection and Isolation Panel [NHANES]    Frequency of Communication with Friends and Family: More than three times a week    Frequency of Social Gatherings with Friends and Family: Three times a week    Attends Religious Services: Never    Active Member of Clubs or Organizations: Yes    Attends Archivist Meetings: More than 4 times per year    Marital Status: Divorced  Intimate Partner Violence: Not At Risk (10/14/2017)   Humiliation, Afraid, Rape, and Kick questionnaire    Fear of Current or Ex-Partner: No    Emotionally Abused: No    Physically Abused: No    Sexually Abused: No   Family History  Problem Relation Age of Onset   Heart disease Mother    Heart disease Father    CVA Father 62   Cancer Sister 59       lymphoma   Asthma Son    Asthma Son    Asthma Son    Skin cancer Brother 34       melanoma   Colon cancer Other       VITAL SIGNS BP 128/84   Temp (!) 97 F (36.1 C)   Ht 5' 2"$  (1.575 m)   Wt 129 lb 6.4 oz (58.7 kg)   BMI 23.67 kg/m   Outpatient Encounter Medications as of 04/17/2022  Medication Sig   aspirin 325 MG tablet Take 325 mg by mouth daily.   fish oil-omega-3 fatty acids 1000 MG capsule Take 1 g by mouth daily.    furosemide (LASIX) 20 MG tablet Take 1 tablet (20 mg total) by mouth daily.   levothyroxine (SYNTHROID) 112 MCG tablet Take 112 mcg by mouth in the morning.   Metoprolol Tartrate 37.5 MG TABS Take 1 tablet by mouth 2 (two) times daily. Hold for BP < than 100   Multiple Vitamin (MULTIVITAMIN) tablet Take 1 tablet by mouth daily.   [DISCONTINUED] aspirin 81 MG chewable tablet Chew 4 tablets (324 mg total) by mouth daily.   [DISCONTINUED] traMADol (ULTRAM) 50 MG tablet Take 1 tablet (50 mg total) by mouth every 6 (six) hours as needed for moderate pain.    No facility-administered encounter medications on file as of 04/17/2022.     SIGNIFICANT DIAGNOSTIC EXAMS   Review of Systems  Constitutional:  Negative for malaise/fatigue.  Respiratory:  Negative for cough and shortness of breath.   Cardiovascular:  Positive for leg swelling. Negative for chest pain and palpitations.  Gastrointestinal:  Negative for abdominal pain, constipation and heartburn.  Musculoskeletal:  Negative for back pain, joint pain and myalgias.  Skin: Negative.   Neurological:  Negative for dizziness.  Psychiatric/Behavioral:  The patient is not nervous/anxious.    Physical Exam Constitutional:      General: She is not in acute distress.    Appearance: She is well-developed. She is not diaphoretic.  Neck:     Thyroid: No thyromegaly.  Cardiovascular:     Rate and Rhythm: Normal rate and regular rhythm.     Pulses: Normal pulses.     Heart sounds: Normal heart sounds.  Pulmonary:     Effort: Pulmonary effort is normal. No  respiratory distress.     Breath sounds: Normal breath sounds.  Abdominal:     General: Bowel sounds are normal. There is no distension.     Palpations: Abdomen is soft.     Tenderness: There is no abdominal tenderness.  Musculoskeletal:        General: Normal range of motion.     Cervical back: Neck supple.     Right lower leg: Edema present.     Left lower leg: Edema present.     Comments: 2+  Lymphadenopathy:     Cervical: No cervical adenopathy.  Skin:    General: Skin is warm and dry.  Neurological:     Mental Status: She is alert. She is disoriented.  Psychiatric:        Mood and Affect: Mood normal.        ASSESSMENT/ PLAN:  TODAY  Coronary artery disease of native arteries of native heart with unspecified angina pectoris Aortic atherosclerosis  Will restart lasix 20 mg daily  Will have her get bmp and tsh in 2 weeks Will have her follow up in 3 months.    Ok Edwards NP [facility]  call  [phone]

## 2022-04-20 NOTE — Progress Notes (Signed)
[address]       [place] [address]             [phone]  HPI: This is an76 yo woman with PMH of breast cancer, hyperlipidemia, hypothyroidism,  diverticulitis, remote tobacco abuse, and degenerative joint disease who was found to have a NSTEMI and critical left main disease. She underwent a CABG x 2 (LIMA to LAD, SVG to OM) by Dr. Roxan Hockey on 03/19/2022. She was discharged to SNF on 04/03/2022. Patient is accompanied by her son (who lives in [place]), aide, and service dog. Her son states she was discharged from the [facility] 02/17. She denies chest pain or shortness of breath. Son notes she sometimes complains of dizziness. Current Outpatient Medications  Medication Sig Dispense Refill   aspirin 325 MG tablet Take 325 mg by mouth daily.     fish oil-omega-3 fatty acids 1000 MG capsule Take 1 g by mouth daily.      furosemide (LASIX) 20 MG tablet Take 1 tablet (20 mg total) by mouth daily. 30 tablet 3   levothyroxine (SYNTHROID) 112 MCG tablet Take 112 mcg by mouth in the morning.     Metoprolol Tartrate 37.5 MG TABS Take 1 tablet by mouth 2 (two) times daily. Hold for BP < than 100     Multiple Vitamin (MULTIVITAMIN) tablet Take 1 tablet by mouth daily.    Vital Signs: Vitals:   04/24/22 1311  BP: 133/69  Pulse: (!) 56  Resp: 18  SpO2: 97%      Physical Exam: CV-RRR Pulmonary-Clear to auscultation bilaterally Abdomen-Soft, non tender, bowel sounds present Extremities-Trace LE edema Wounds-Sternal and RLE wounds are clean, dry well healed.  Diagnostic Tests:  Narrative & Impression  CLINICAL DATA:  Provided history: CABG.   EXAM: CHEST - 2 VIEW   COMPARISON:  Prior chest radiographs 03/27/2022 and earlier.   FINDINGS: Prior median sternotomy/CABG. The cardiomediastinal silhouette is unchanged. Aortic atherosclerosis. Persistent linear/bandlike opacities within the mid-to-lower left lung, which may reflect atelectasis or scarring. No evidence  of pleural effusion or pneumothorax. Spondylosis and levocurvature of the thoracic spine.   IMPRESSION: 1. Persistent linear/bandlike opacities within the mid-to-lower left lung, which may reflect atelectasis or scarring. 2. No appreciable airspace consolidation or pulmonary edema. 3.  Aortic Atherosclerosis (ICD10-I70.0).     Electronically Signed   By: Kellie Simmering D.O.   On: 04/24/2022 12:58   Impression and Plan: Overall, Ms. Million is continuing to recover from coronary artery bypass grafting surgery. She has already seen Elwyn Reach NP from cardiology on 02/12. EKG done showed sinus bradycardia and incomplete RBBB. No changes were made to her medications. She has another follow up with cardiology in May. She is bradycardic so I decreased her Lopressor to 25 mg bid. Of note, she had atrial tachycardia post op so BB was titrated upward. She will continue with Lisinopril 40 mg daily, ec asa 325 mg daily, and low dose daily Lasix. Her son asked if all above medications have to be given by a certain time and that his mom complaints of upset stomach. I told him there was no certain time to give medications.  She usually takes Levothyroxine first (on empty stomach). I recommended he try to give Lopressor then Lisinopril about 1 hours later. She did drive before the surgery, but son prefers her not to drive now. She has a home health aide that will assist her and transport her. She was instructed to continue with sternal  precautions  (no more than lifting 10 pounds) are to continue for a minimum of 8 weeks. She should wait at least 2 more weeks to walk her dog as he runs and pulls. Participation in cardiac rehab was highly encouraged;however, per her her son, home PT is supposed to start soon. She will return PRN to [facility] and continued to be followed by cardiology (next appointment is in May).   Nani Skillern, PA-C [facility] [phone]

## 2022-04-23 ENCOUNTER — Other Ambulatory Visit: Payer: Self-pay | Admitting: Thoracic Surgery (Cardiothoracic Vascular Surgery)

## 2022-04-23 DIAGNOSIS — Z951 Presence of aortocoronary bypass graft: Secondary | ICD-10-CM

## 2022-04-24 ENCOUNTER — Encounter: Payer: Self-pay | Admitting: Physician Assistant

## 2022-04-24 ENCOUNTER — Ambulatory Visit (INDEPENDENT_AMBULATORY_CARE_PROVIDER_SITE_OTHER): Payer: Self-pay | Admitting: Physician Assistant

## 2022-04-24 ENCOUNTER — Ambulatory Visit
Admission: RE | Admit: 2022-04-24 | Discharge: 2022-04-24 | Disposition: A | Discharge status: Home / Self Care | Payer: Medicare PPO | Source: Ambulatory Visit | Attending: Thoracic Surgery (Cardiothoracic Vascular Surgery) | Admitting: Thoracic Surgery (Cardiothoracic Vascular Surgery)

## 2022-04-24 ENCOUNTER — Telehealth: Payer: Self-pay | Admitting: *Deleted

## 2022-04-24 VITALS — BP 133/69 | HR 56 | Resp 18 | Ht 62.0 in | Wt 121.0 lb

## 2022-04-24 DIAGNOSIS — Z951 Presence of aortocoronary bypass graft: Secondary | ICD-10-CM

## 2022-04-24 MED ORDER — METOPROLOL TARTRATE 25 MG PO TABS: 25.0000 mg | ORAL_TABLET | Freq: Two times a day (BID) | ORAL | 1 refills | Status: DC

## 2022-04-24 MED ORDER — LISINOPRIL 40 MG PO TABS: 40.0000 mg | ORAL_TABLET | Freq: Every day | ORAL | 2 refills | Status: DC

## 2022-04-24 NOTE — Telephone Encounter (Signed)
Cara with [facility] called requesting Verbal orders for Nursing 1X5weeks.   Verbal Orders given.

## 2022-04-24 NOTE — Patient Instructions (Addendum)
Changes to medications: Decrease Metoprolol Tartrate to 25 mg bid  Please take Levothyroxine first then Metoprolol. Lisinopril can be given up to 1 hour afterward.  You are encouraged to enroll and participate in the outpatient cardiac rehab program beginning as soon as practical. 2. Continue to avoid any heavy lifting or strenuous use of your arms or shoulders for at least a total of three months from the time of surgery.  3. 3. After three months you may gradually increase how much you lift or otherwise use your arms or chest as tolerated, with limits based upon whether or not activities lead to the return of significant discomfort. 4. We discussed her HGA1C prior to surgery was 5.7. She likely has pre diabetes and should follow up with PCP for further surveillance.

## 2022-04-27 MED ORDER — ROSUVASTATIN CALCIUM 20 MG PO TABS: 20.0000 mg | ORAL_TABLET | Freq: Every day | ORAL | 3 refills | Status: DC

## 2022-04-28 ENCOUNTER — Encounter: Payer: Self-pay | Admitting: Nurse Practitioner

## 2022-04-30 ENCOUNTER — Telehealth: Payer: Self-pay

## 2022-04-30 ENCOUNTER — Encounter: Payer: Self-pay | Admitting: Nurse Practitioner

## 2022-04-30 NOTE — Telephone Encounter (Signed)
I will have patient schedule to have labs ordered by Ok Edwards, NP, but can she get the other labs drawn at the same time. The other labs are requested through [facility].   Message routed to Sherrie Mustache, NP

## 2022-04-30 NOTE — Telephone Encounter (Signed)
Anna Genre from [facility] called and wanted verbal orders for patient to have speech therapy once a week for 6 weeks. Verbal orders given. Message sent as FYI.

## 2022-04-30 NOTE — Telephone Encounter (Signed)
Message routed to [facility] staff to assist.

## 2022-04-30 NOTE — Telephone Encounter (Signed)
It looks like we just need to add a lipid panel to her blood work- see if Alice [name] is agreeable so they will all result togther.

## 2022-05-01 ENCOUNTER — Other Ambulatory Visit: Payer: Self-pay

## 2022-05-01 DIAGNOSIS — I2510 Atherosclerotic heart disease of native coronary artery without angina pectoris: Secondary | ICD-10-CM

## 2022-05-02 NOTE — Telephone Encounter (Signed)
Thank you :)

## 2022-05-03 ENCOUNTER — Other Ambulatory Visit: Payer: Medicare PPO

## 2022-05-03 DIAGNOSIS — I2510 Atherosclerotic heart disease of native coronary artery without angina pectoris: Secondary | ICD-10-CM

## 2022-05-03 DIAGNOSIS — E039 Hypothyroidism, unspecified: Secondary | ICD-10-CM

## 2022-05-03 DIAGNOSIS — I214 Non-ST elevation (NSTEMI) myocardial infarction: Secondary | ICD-10-CM

## 2022-05-04 LAB — BASIC METABOLIC PANEL WITH GFR
BUN: 20 mg/dL (ref 7–25)
CO2: 31 mmol/L (ref 20–32)
Calcium: 9.3 mg/dL (ref 8.6–10.4)
Chloride: 100 mmol/L (ref 98–110)
Creat: 0.88 mg/dL (ref 0.60–0.95)
Glucose, Bld: 104 mg/dL — ABNORMAL HIGH (ref 65–99)
Potassium: 4.2 mmol/L (ref 3.5–5.3)
Sodium: 141 mmol/L (ref 135–146)
eGFR: 62 mL/min/{1.73_m2} (ref >=60)

## 2022-05-04 LAB — LIPID PANEL
Cholesterol: 143 mg/dL (ref <200)
HDL: 66 mg/dL (ref >=50)
LDL Cholesterol (Calc): 62 mg/dL (calc)
Non-HDL Cholesterol (Calc): 77 mg/dL (calc) (ref <130)
Total CHOL/HDL Ratio: 2.2 (calc) (ref <5.0)
Triglycerides: 71 mg/dL (ref <150)

## 2022-05-04 LAB — TSH: TSH: 0.13 mIU/L — ABNORMAL LOW (ref 0.40–4.50)

## 2022-05-04 NOTE — Telephone Encounter (Signed)
Noted  

## 2022-05-06 ENCOUNTER — Encounter: Payer: Self-pay | Admitting: Nurse Practitioner

## 2022-05-07 ENCOUNTER — Encounter: Payer: Self-pay | Admitting: Nurse Practitioner

## 2022-05-07 MED ORDER — LEVOTHYROXINE SODIUM 100 MCG PO TABS: 100.0000 ug | ORAL_TABLET | Freq: Every day | ORAL | 0 refills | Status: DC

## 2022-05-09 ENCOUNTER — Encounter: Payer: Self-pay | Admitting: Nurse Practitioner

## 2022-05-23 ENCOUNTER — Encounter: Payer: TRICARE For Life (TFL) | Admitting: Nurse Practitioner

## 2022-05-23 NOTE — Progress Notes (Signed)
  Error   Names of all persons participating in the telemedicine service and their role in the encounter:  Patient, Alice [name], RMA, Sherrie Mustache, NP.    Time spent on call:  8 minutes spent on the phone with Medical Assistant.

## 2022-05-25 ENCOUNTER — Encounter: Payer: Self-pay | Admitting: Nurse Practitioner

## 2022-05-31 ENCOUNTER — Encounter: Payer: Self-pay | Admitting: Adult Health

## 2022-06-14 ENCOUNTER — Ambulatory Visit: Payer: TRICARE For Life (TFL) | Admitting: Family

## 2022-06-21 ENCOUNTER — Encounter: Payer: Self-pay | Admitting: Nurse Practitioner

## 2022-06-23 ENCOUNTER — Encounter: Payer: Self-pay | Admitting: Cardiology

## 2022-06-29 ENCOUNTER — Ambulatory Visit (INDEPENDENT_AMBULATORY_CARE_PROVIDER_SITE_OTHER): Payer: Medicare PPO | Admitting: Nurse Practitioner

## 2022-06-29 ENCOUNTER — Encounter: Payer: Self-pay | Admitting: Nurse Practitioner

## 2022-06-29 VITALS — BP 128/76 | HR 95 | Temp 97.4°F | Ht 62.0 in | Wt 122.0 lb

## 2022-06-29 DIAGNOSIS — E782 Mixed hyperlipidemia: Secondary | ICD-10-CM | POA: Diagnosis not present

## 2022-06-29 DIAGNOSIS — R413 Other amnesia: Secondary | ICD-10-CM

## 2022-06-29 DIAGNOSIS — E039 Hypothyroidism, unspecified: Secondary | ICD-10-CM | POA: Diagnosis not present

## 2022-06-29 DIAGNOSIS — Z853 Personal history of malignant neoplasm of breast: Secondary | ICD-10-CM | POA: Diagnosis not present

## 2022-06-29 DIAGNOSIS — I25118 Atherosclerotic heart disease of native coronary artery with other forms of angina pectoris: Secondary | ICD-10-CM | POA: Diagnosis not present

## 2022-06-29 MED ORDER — ROSUVASTATIN CALCIUM 20 MG PO TABS: 20.0000 mg | ORAL_TABLET | Freq: Every day | ORAL | 1 refills | Status: DC

## 2022-06-29 MED ORDER — LISINOPRIL 10 MG PO TABS: 40.0000 mg | ORAL_TABLET | Freq: Every day | ORAL | 1 refills | Status: DC

## 2022-06-29 MED ORDER — LISINOPRIL 10 MG PO TABS: 10.0000 mg | ORAL_TABLET | Freq: Every day | ORAL | 1 refills | Status: DC

## 2022-06-29 MED ORDER — METOPROLOL TARTRATE 25 MG PO TABS: 12.5000 mg | ORAL_TABLET | Freq: Two times a day (BID) | ORAL | 1 refills | Status: DC

## 2022-06-29 NOTE — Progress Notes (Signed)
Careteam: Patient Care Team: Sharon Seller, NP as PCP - General (Geriatric Medicine) Meriam Sprague, MD as PCP - Cardiology (Cardiology) Kathryne Hitch, MD as Consulting Physician (Orthopedic Surgery) Magrinat, Valentino Hue, MD (Inactive) as Consulting Physician (Oncology)  PLACE OF SERVICE:  [facility]  Advanced Directive information    Allergies  Allergen Reactions   Amoxicillin Rash   Diphenhydramine Rash    "It is the red dye in Benadryl that I''m allergic to"   Naproxen Sodium Rash    "It is the blue dye in naproxen that I'm allergic to."   Lipitor [Atorvastatin]     Unknown reaction   Meclizine Other (See Comments)    Worsening dizziness and blurred vision   Amlodipine Other (See Comments)    Dizzy   Cefdinir Other (See Comments)    Vertigo and headaches    Codeine Other (See Comments)    unknown   Ferrous Sulfate     "tears belly up"   Methocarbamol Other (See Comments)    insomnia    Prednisone Other (See Comments)    vertigo and headaches   Skelaxin [Metaxalone] Other (See Comments)    Gastric upset.    Tramadol Nausea Only   Hctz [Hydrochlorothiazide] Itching    Chief Complaint  Patient presents with   Medical Management of Chronic Issues    3 month follow up      HPI: Patient is a 89 y.o. female for routine follow up.   Reports she has a vague memory of her hospitalization. She went in for chest pain and had NSTEMI and had CABG she was then discharged to rehab facility, she was followed up in office in February 2024. Prior to this she has not been seen here since December 2021   She has not seen her oncologist for hx of breast cancer since April 2021. She would want ongoing treatment if breast cancer was found again.   She does not like to take medication. Reports she is taking her thyroid medication and ASA Reports she ran out of other medication and therefore did not continue to take it  Reports she does not remember having  breast cancer.  Reports memory is selective.   Has 3 sons but none locally- all within 3 hours if they have to be. Her friend comes by daily   Review of Systems:  Review of Systems  Constitutional:  Negative for chills, fever and weight loss.  HENT:  Negative for tinnitus.   Respiratory:  Negative for cough, sputum production and shortness of breath.   Cardiovascular:  Negative for chest pain, palpitations and leg swelling.  Gastrointestinal:  Negative for abdominal pain, constipation, diarrhea and heartburn.  Genitourinary:  Negative for dysuria, frequency and urgency.  Musculoskeletal:  Negative for back pain, falls, joint pain and myalgias.  Skin: Negative.   Neurological:  Negative for dizziness and headaches.  Psychiatric/Behavioral:  Positive for memory loss. Negative for depression. The patient does not have insomnia.     Past Medical History:  Diagnosis Date   Alopecia    Breast cancer (HCC)     Right , chemo, radiation   DDD (degenerative disc disease)    Heart palpitations    History of diverticulitis    History of diverticulitis of colon    Hypercholesteremia    Hypothyroidism    Insect bites    Internal and external bleeding hemorrhoids 08/11/2015   Personal history of chemotherapy 2012   Personal history of radiation  therapy 2012   Scoliosis    L4 L5   Seasonal allergies    SUI (stress urinary incontinence, female)    Past Surgical History:  Procedure Laterality Date   BLADDER SUSPENSION     bowel rescetion     BOWEL RESECTION     BREAST BIOPSY Right 03/14/2010   BREAST LUMPECTOMY  2012   right   CATARACT EXTRACTION     left   COLON SURGERY  20 YRS AGO   BOWEL RESECTION   CORONARY ARTERY BYPASS GRAFT N/A 03/19/2022   Procedure: CORONARY ARTERY BYPASS GRAFTING (CABG) X TWO, USING LEFT INTERNAL MAMMARY ARTERY AND RIGHT LEG GREATER SAPHENOUS VEIN HARVESTED ENDOSCOPICALLY;  Surgeon: Loreli Slot, MD;  Location: [facility] OR;  Service: Open Heart Surgery;   Laterality: N/A;   EYE SURGERY     HEMORRHOID BANDING     HERNIA REPAIR     RIH, Dr. Zachery Dakins   IABP INSERTION N/A 03/19/2022   Procedure: IABP Insertion;  Surgeon: Runell Gess, MD;  Location: [facility] INVASIVE CV LAB;  Service: Cardiovascular;  Laterality: N/A;   IR THORACENTESIS ASP PLEURAL SPACE W/IMG GUIDE  03/26/2022   JOINT REPLACEMENT     LEFT HEART CATH AND CORONARY ANGIOGRAPHY N/A 03/19/2022   Procedure: LEFT HEART CATH AND CORONARY ANGIOGRAPHY;  Surgeon: Runell Gess, MD;  Location: [facility];  Service: Cardiovascular;  Laterality: N/A;   PORT-A-CATH REMOVAL     TEE WITHOUT CARDIOVERSION N/A 03/19/2022   Procedure: TRANSESOPHAGEAL ECHOCARDIOGRAM;  Surgeon: Loreli Slot, MD;  Location: [facility] OR;  Service: Open Heart Surgery;  Laterality: N/A;   TONSILLECTOMY  2001   Diverticulitis   TOTAL HIP ARTHROPLASTY Right 09/10/2014   Procedure: RIGHT TOTAL HIP ARTHROPLASTY ANTERIOR APPROACH;  Surgeon: Kathryne Hitch, MD;  Location: [facility] ORS;  Service: Orthopedics;  Laterality: Right;   Social History:   reports that she quit smoking about 57 years ago. Her smoking use included cigarettes. She has never used smokeless tobacco. She reports current alcohol use. She reports that she does not use drugs.  Family History  Problem Relation Age of Onset   Heart disease Mother    Heart disease Father    CVA Father 69   Cancer Sister 90       lymphoma   Asthma Son    Asthma Son    Asthma Son    Skin cancer Brother 53       melanoma   Colon cancer Other     Medications: Patient's Medications  New Prescriptions   No medications on file  Previous Medications   ASPIRIN 325 MG TABLET    Take 325 mg by mouth daily.   LEVOTHYROXINE (SYNTHROID) 100 MCG TABLET    Take 1 tablet (100 mcg total) by mouth daily.   MULTIPLE VITAMIN (MULTIVITAMIN) TABLET    Take 1 tablet by mouth daily.   OMEGA-3 FATTY ACIDS (FISH OIL PO)    Take by mouth.  Modified Medications   Modified  Medication Previous Medication   LISINOPRIL (ZESTRIL) 10 MG TABLET lisinopril (ZESTRIL) 40 MG tablet      Take 4 tablets (40 mg total) by mouth daily.    Take 40 mg by mouth daily.   METOPROLOL TARTRATE (LOPRESSOR) 25 MG TABLET metoprolol tartrate (LOPRESSOR) 25 MG tablet      Take 0.5 tablets (12.5 mg total) by mouth 2 (two) times daily.    Take 1 tablet (25 mg total) by mouth 2 (two) times  daily.   ROSUVASTATIN (CRESTOR) 20 MG TABLET rosuvastatin (CRESTOR) 20 MG tablet      Take 1 tablet (20 mg total) by mouth daily.    Take 1 tablet (20 mg total) by mouth daily.  Discontinued Medications   FISH OIL-OMEGA-3 FATTY ACIDS 1000 MG CAPSULE    Take 1 g by mouth daily.    MULTIPLE VITAMIN (MULTIVITAMIN) TABLET    Take 1 tablet by mouth daily.    Physical Exam:  Vitals:   06/29/22 1513  BP: 128/76  Pulse: 95  Temp: (!) 97.4 F (36.3 C)  TempSrc: Temporal  SpO2: 97%  Weight: 122 lb (55.3 kg)  Height: 5\' 2"  (1.575 m)   Body mass index is 22.31 kg/m. Wt Readings from Last 3 Encounters:  06/29/22 122 lb (55.3 kg)  04/24/22 121 lb (54.9 kg)  04/17/22 129 lb 6.4 oz (58.7 kg)    Physical Exam Constitutional:      General: She is not in acute distress.    Appearance: She is well-developed. She is not diaphoretic.  HENT:     Head: Normocephalic and atraumatic.     Mouth/Throat:     Pharynx: No oropharyngeal exudate.  Eyes:     Conjunctiva/sclera: Conjunctivae normal.     Pupils: Pupils are equal, round, and reactive to light.  Cardiovascular:     Rate and Rhythm: Normal rate and regular rhythm.     Heart sounds: Normal heart sounds.  Pulmonary:     Effort: Pulmonary effort is normal.     Breath sounds: Normal breath sounds.  Abdominal:     General: Bowel sounds are normal.     Palpations: Abdomen is soft.  Musculoskeletal:     Cervical back: Normal range of motion and neck supple.     Right lower leg: No edema.     Left lower leg: No edema.  Skin:    General: Skin is warm  and dry.  Neurological:     Mental Status: She is alert.     Labs reviewed: Basic Metabolic Panel: Recent Labs    03/20/22 0335 03/20/22 1610 03/20/22 1819 03/21/22 0424 03/26/22 0103 03/27/22 0650 03/29/22 0858 04/03/22 0704 04/09/22 1029 05/03/22 0843  NA 141   < > 140   < > 137   < > 137  --  142 141  K 3.8   < > 3.9   < > 3.6   < > 4.0  --  4.5 4.2  CL 110  --  110   < > 94*   < > 94*  --  103 100  CO2 25  --  23   < > 32   < > 32  --  26 31  GLUCOSE 97  --  126*   < > 122*   < > 113*  --  108* 104*  BUN 15  --  14   < > 18   < > 19  --  22 20  CREATININE 0.78  --  0.81   < > 0.91   < > 0.92 1.04* 1.05* 0.88  CALCIUM 8.0*  --  8.4*   < > 8.5*   < > 8.8*  --  9.3 9.3  MG 3.1*  --  2.3  --  1.8  --   --   --   --   --   TSH  --   --   --   --   --   --   --   --   --  0.13*   < > = values in this interval not displayed.   Liver Function Tests: No results for input(s): "AST", "ALT", "ALKPHOS", "BILITOT", "PROT", "ALBUMIN" in the last 8760 hours. No results for input(s): "LIPASE", "AMYLASE" in the last 8760 hours. No results for input(s): "AMMONIA" in the last 8760 hours. CBC: Recent Labs    03/19/22 0628 03/19/22 1800 03/23/22 0221 03/24/22 1559 04/09/22 1029  WBC 9.1   < > 9.1 9.8 8.3  NEUTROABS 4.9  --   --   --   --   HGB 12.5   < > 8.2* 9.1* 12.1  HCT 40.0   < > 26.2* 28.9* 38.1  MCV 88.9   < > 90.3 90.3 93  PLT 252   < > 132* 202 369   < > = values in this interval not displayed.   Lipid Panel: Recent Labs    03/19/22 0815 05/03/22 0843  CHOL 238* 143  HDL 80 66  LDLCALC 150* 62  TRIG 42 71  CHOLHDL 3.0 2.2   TSH: Recent Labs    05/03/22 0843  TSH 0.13*   A1C: Lab Results  Component Value Date   HGBA1C 5.7 (H) 03/19/2022     Assessment/Plan 1. Hx of breast cancer -lost to follow up with oncology but would like to follow up.  - Ambulatory referral to Hematology / Oncology  2. 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 She has follow up scheduled with cardiology,  - lisinopril (ZESTRIL) 10 MG tablet; Take 4 tablets (40 mg total) by mouth daily.  Dispense: 90 tablet; Refill: 1 - metoprolol tartrate (LOPRESSOR) 25 MG tablet; Take 0.5 tablets (12.5 mg total) by mouth 2 (two) times daily.  Dispense: 60 tablet; Refill: 1 - rosuvastatin (CRESTOR) 20 MG tablet; Take 1 tablet (20 mg total) by mouth daily.  Dispense: 90 tablet; Refill: 1  4. Mixed hyperlipidemia Currently off crestor, encouraged to restart  - rosuvastatin (CRESTOR) 20 MG tablet; Take 1 tablet (20 mg total) by mouth daily.  Dispense: 90 tablet; Refill: 1 - Lipid panel - Complete Metabolic Panel with eGFR  5. Memory loss -reports she does not feel like this is an issues, friend helping with visit.  Will follow up labs to make sure this not contributing.  - CBC with Differential/Platelet - Vitamin B12  6. Acquired hypothyroidism Elevated TSh on last check, will follow up.  - TSH   Return in about 4 months (around 10/30/2022) for routine follow up.  Janene Harvey. [facility] [phone]

## 2022-06-29 NOTE — Patient Instructions (Signed)
Make sure to restart the metoprolol, lisinopril and crestor

## 2022-07-02 ENCOUNTER — Telehealth: Payer: Self-pay | Admitting: *Deleted

## 2022-07-02 ENCOUNTER — Encounter: Payer: Self-pay | Admitting: Nurse Practitioner

## 2022-07-02 ENCOUNTER — Other Ambulatory Visit: Payer: TRICARE For Life (TFL)

## 2022-07-02 ENCOUNTER — Ambulatory Visit (INDEPENDENT_AMBULATORY_CARE_PROVIDER_SITE_OTHER): Payer: Medicare PPO | Admitting: Nurse Practitioner

## 2022-07-02 VITALS — BP 136/70 | HR 63 | Temp 96.3°F | Ht 62.0 in | Wt 120.6 lb

## 2022-07-02 DIAGNOSIS — Z0000 Encounter for general adult medical examination without abnormal findings: Secondary | ICD-10-CM

## 2022-07-02 DIAGNOSIS — I25118 Atherosclerotic heart disease of native coronary artery with other forms of angina pectoris: Secondary | ICD-10-CM | POA: Diagnosis not present

## 2022-07-02 DIAGNOSIS — E2839 Other primary ovarian failure: Secondary | ICD-10-CM | POA: Diagnosis not present

## 2022-07-02 DIAGNOSIS — Z79899 Other long term (current) drug therapy: Secondary | ICD-10-CM | POA: Diagnosis not present

## 2022-07-02 NOTE — Patient Instructions (Signed)
  Ms. Wyeth , Thank you for taking time to come for your Medicare Wellness Visit. I appreciate your ongoing commitment to your health goals. Please review the following plan we discussed and let me know if I can assist you in the future.   These are the goals we discussed:  Goals      Increase water intake     Pt will try to increase water intake.         This is a list of the screening recommended for you and due dates:  Health Maintenance  Topic Date Due   DTaP/Tdap/Td vaccine (1 - Tdap) Never done   COVID-19 Vaccine (4 - 2023-24 season) 07/15/2022*   Zoster (Shingles) Vaccine (1 of 2) 06/05/2023*   Pneumonia Vaccine (1 of 2 - PCV) 06/29/2023*   Flu Shot  09/27/2022   Medicare Annual Wellness Visit  07/02/2023   DEXA scan (bone density measurement)  Completed   HPV Vaccine  Aged Out  *Topic was postponed. The date shown is not the original due date.

## 2022-07-02 NOTE — Telephone Encounter (Signed)
   Pre-operative Risk Assessment    Patient Name: Alice [name]  DOB: 1930-05-03 MRN: [id number]     Request for Surgical Clearance    Procedure:  Dental Extraction - Amount of Teeth to be Pulled:  8 TEETH SURGICALLY EXTRACTED   Date of Surgery:  Clearance TBD                                 Surgeon:  DR. Ardyth Harps, DDS Surgeon's Group or Practice Name:  [facility] Phone number:  [phone] Fax number:  [phone]   Type of Clearance Requested:   - Medical ; NO MEDICATIONS LISTED AS NEEDING TO BE HELD   Type of Anesthesia:   NITROUS OXIDE   Additional requests/questions:    Elpidio Anis   07/02/2022, 2:46 PM

## 2022-07-02 NOTE — Telephone Encounter (Signed)
Marcelino Duster,  You saw this patient on 04/09/2022. Will you please comment on medical clearance for extraction of 8 teeth with nitrous for anesthesia?  Please route your response to [facility] Preop. I will communicate with requesting office once you have given recommendations.   Thank you!  Carlos Levering, NP

## 2022-07-02 NOTE — Progress Notes (Signed)
Subjective:   Alice [name] is a 89 y.o. female who presents for Medicare Annual (Subsequent) preventive examination.  Review of Systems     Cardiac Risk Factors include: hypertension;advanced age (>32men, >45 women);dyslipidemia     Objective:    Today's Vitals   07/02/22 1102  BP: 136/70  Pulse: 63  Temp: (!) 96.3 F (35.7 C)  SpO2: 97%  Weight: 120 lb 9.6 oz (54.7 kg)  Height: 5\' 2"  (1.575 m)   Body mass index is 22.06 kg/m.  Wt Readings from Last 3 Encounters:  07/02/22 120 lb 9.6 oz (54.7 kg)  06/29/22 122 lb (55.3 kg)  04/24/22 121 lb (54.9 kg)        07/02/2022   11:05 AM 02/01/2020   10:13 AM 10/27/2019   10:02 AM 08/27/2019   12:08 PM 08/03/2019    1:43 PM 01/05/2019    3:16 PM 11/13/2018    1:24 PM  Advanced Directives  Does Patient Have a Medical Advance Directive? Yes Yes Yes Yes Yes Yes Yes  Type of Estate agent of [place];Living will;Out of facility DNR (pink MOST or yellow form) Out of facility DNR (pink MOST or yellow form) Out of facility DNR (pink MOST or yellow form) Living will Healthcare Power of [place];Out of facility DNR (pink MOST or yellow form) Out of facility DNR (pink MOST or yellow form);Healthcare Power of [employer] of [place];Living will;Out of facility DNR (pink MOST or yellow form)  Does patient want to make changes to medical advance directive? No - Patient declined No - Patient declined No - Patient declined No - Patient declined No - Patient declined  No - Patient declined  Copy of Healthcare Power of Attorney in Chart? Yes - validated most recent copy scanned in chart (See row information)    Yes - validated most recent copy scanned in chart (See row information) Yes - validated most recent copy scanned in chart (See row information) Yes - validated most recent copy scanned in chart (See row information)  Pre-existing out of facility DNR order (yellow form or pink MOST form) Yellow form placed in  chart (order not valid for inpatient use)   Yellow form placed in chart (order not valid for inpatient use) Pink MOST/Yellow Form most recent copy in chart - Physician notified to receive inpatient order Yellow form placed in chart (order not valid for inpatient use) Yellow form placed in chart (order not valid for inpatient use)    Current Medications (verified) Outpatient Encounter Medications as of 07/02/2022  Medication Sig   levothyroxine (SYNTHROID) 100 MCG tablet Take 1 tablet (100 mcg total) by mouth daily.   [DISCONTINUED] aspirin 325 MG tablet Take 325 mg by mouth daily.   [DISCONTINUED] lisinopril (ZESTRIL) 10 MG tablet Take 1 tablet (10 mg total) by mouth daily.   [DISCONTINUED] metoprolol tartrate (LOPRESSOR) 25 MG tablet Take 0.5 tablets (12.5 mg total) by mouth 2 (two) times daily.   [DISCONTINUED] Multiple Vitamin (MULTIVITAMIN) tablet Take 1 tablet by mouth daily.   [DISCONTINUED] Omega-3 Fatty Acids (FISH OIL PO) Take by mouth.   [DISCONTINUED] rosuvastatin (CRESTOR) 20 MG tablet Take 1 tablet (20 mg total) by mouth daily.   No facility-administered encounter medications on file as of 07/02/2022.    Allergies (verified) Amoxicillin, Diphenhydramine, Naproxen sodium, Lipitor [atorvastatin], Meclizine, Amlodipine, Cefdinir, Codeine, Ferrous sulfate, Methocarbamol, Prednisone, Skelaxin [metaxalone], Tramadol, and Hctz [hydrochlorothiazide]   History: Past Medical History:  Diagnosis Date   Alopecia  Breast cancer (HCC)     Right , chemo, radiation   DDD (degenerative disc disease)    Heart palpitations    History of diverticulitis    History of diverticulitis of colon    Hypercholesteremia    Hypothyroidism    Insect bites    Internal and external bleeding hemorrhoids 08/11/2015   Personal history of chemotherapy 2012   Personal history of radiation therapy 2012   Scoliosis    L4 L5   Seasonal allergies    SUI (stress urinary incontinence, female)    Past Surgical  History:  Procedure Laterality Date   BLADDER SUSPENSION     bowel rescetion     BOWEL RESECTION     BREAST BIOPSY Right 03/14/2010   BREAST LUMPECTOMY  2012   right   CATARACT EXTRACTION     left   COLON SURGERY  20 YRS AGO   BOWEL RESECTION   CORONARY ARTERY BYPASS GRAFT N/A 03/19/2022   Procedure: CORONARY ARTERY BYPASS GRAFTING (CABG) X TWO, USING LEFT INTERNAL MAMMARY ARTERY AND RIGHT LEG GREATER SAPHENOUS VEIN HARVESTED ENDOSCOPICALLY;  Surgeon: Loreli Slot, MD;  Location: [facility];  Service: Open Heart Surgery;  Laterality: N/A;   EYE SURGERY     HEMORRHOID BANDING     HERNIA REPAIR     RIH, Dr. Zachery Dakins   IABP INSERTION N/A 03/19/2022   Procedure: IABP Insertion;  Surgeon: Runell Gess, MD;  Location: [facility] INVASIVE [facility];  Service: Cardiovascular;  Laterality: N/A;   IR THORACENTESIS ASP PLEURAL SPACE W/IMG GUIDE  03/26/2022   JOINT REPLACEMENT     LEFT HEART CATH AND CORONARY ANGIOGRAPHY N/A 03/19/2022   Procedure: LEFT HEART CATH AND CORONARY ANGIOGRAPHY;  Surgeon: Runell Gess, MD;  Location: [facility];  Service: Cardiovascular;  Laterality: N/A;   PORT-A-CATH REMOVAL     TEE WITHOUT CARDIOVERSION N/A 03/19/2022   Procedure: TRANSESOPHAGEAL ECHOCARDIOGRAM;  Surgeon: Loreli Slot, MD;  Location: [facility] OR;  Service: Open Heart Surgery;  Laterality: N/A;   TONSILLECTOMY  2001   Diverticulitis   TOTAL HIP ARTHROPLASTY Right 09/10/2014   Procedure: RIGHT TOTAL HIP ARTHROPLASTY ANTERIOR APPROACH;  Surgeon: Kathryne Hitch, MD;  Location: [facility] ORS;  Service: Orthopedics;  Laterality: Right;   Family History  Problem Relation Age of Onset   Heart disease Mother    Heart disease Father    CVA Father 13   Cancer Sister 78       lymphoma   Asthma Son    Asthma Son    Asthma Son    Skin cancer Brother 24       melanoma   Colon cancer Other    Social History   Socioeconomic History   Marital status: Divorced    Spouse name: Not on file    Number of children: 3   Years of education: Not on file   Highest education level: Not on file  Occupational History   Occupation: Retired    Associate Professor: RETIRED  Tobacco Use   Smoking status: Former    Types: Cigarettes    Quit date: 08/09/1964    Years since quitting: 57.9   Smokeless tobacco: Never  Vaping Use   Vaping Use: Never used  Substance and Sexual Activity   Alcohol use: Yes    Comment: RARE   Drug use: No   Sexual activity: Not Currently  Other Topics Concern   Not on file  Social History Narrative   Tobacco  use, amount per day now: N/A      Past tobacco use, amount per day: 1 pack, Quit 1969      How many years did you use tobacco: N/A      Alcohol use (drinks per week): Occassionally. Glass of wine- Holidays      Diet: N/A      Do you drink/eat things with caffeine? Coffee and Tea      Marital status: Divorced             What year were you married?      Do you live in a house, apartment, assisted living, condo, trailer? Condo      Is it one or more stories? One      How many persons live in your home? Self and Service Dog      Do you have any pets in your home? 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 Walking            How often? Usually daily 2-4 times      Do you have a living will? Yes      Do you have a DNR form? N/A            If not, do you want to discuss one? N/A      Do you have signed POA/HPOA forms? No                Social Determinants of Health   Financial Resource Strain: Low Risk  (10/14/2017)   Overall Financial Resource Strain (CARDIA)    Difficulty of Paying Living Expenses: Not hard at all  Food Insecurity: No Food Insecurity (10/14/2017)   Hunger Vital Sign    Worried About Running Out of Food in the Last Year: Never true    Ran Out of Food in the Last Year: Never true  Transportation Needs: No Transportation Needs (10/14/2017)   PRAPARE - Therapist, art (Medical): No    Lack of Transportation (Non-Medical): No  Physical Activity: Sufficiently Active (10/14/2017)   Exercise Vital Sign    Days of Exercise per Week: 7 days    Minutes of Exercise per Session: 90 min  Stress: Stress Concern Present (10/14/2017)   [employer] of Occupational Health - Occupational Stress Questionnaire    Feeling of Stress : To some extent  Social Connections: Somewhat Isolated (10/14/2017)   Social Connection and Isolation Panel [NHANES]    Frequency of Communication with Friends and Family: More than three times a week    Frequency of Social Gatherings with Friends and Family: Three times a week    Attends Religious Services: Never    Active Member of Clubs or Organizations: Yes    Attends Engineer, structural: More than 4 times per year    Marital Status: Divorced    Tobacco Counseling Counseling given: Not Answered   Clinical Intake:  Pre-visit preparation completed: Yes        BMI - recorded: 22 Nutritional Status: BMI of 19-24  Normal Diabetes: No  How often do you need to have someone help you when you read instructions, pamphlets, or other written materials from your doctor or pharmacy?: 1 - Never  Diabetic?no         Activities of Daily Living    07/02/2022   11:07 AM 06/29/2022  3:12 PM  In your present state of health, do you have any difficulty performing the following activities:  Hearing? 0 0  Vision? 0 0  Difficulty concentrating or making decisions? 1 0  Walking or climbing stairs? 0 0  Dressing or bathing? 0 0  Doing errands, shopping? 1 0  Preparing Food and eating ? N N  Using the Toilet? N N  In the past six months, have you accidently leaked urine? N N  Do you have problems with loss of bowel control? N N  Managing your Medications? N N  Managing your Finances? N N  Housekeeping or managing your Housekeeping? N N    Patient Care Team: Sharon Seller, NP as PCP -  General (Geriatric Medicine) Meriam Sprague, MD as PCP - Cardiology (Cardiology) Kathryne Hitch, MD as Consulting Physician (Orthopedic Surgery) Magrinat, Valentino Hue, MD (Inactive) as Consulting Physician (Oncology)  Indicate any recent Medical Services you may have received from other than [facility] providers in the past year (date may be approximate).     Assessment:   This is a routine wellness examination for [place].  Hearing/Vision screen Hearing Screening - Comments:: No hearing concerns or wear any hearing aids Vision Screening - Comments:: No vision concerns/ wear glasses  Dietary issues and exercise activities discussed: Current Exercise Habits: Home exercise routine, Type of exercise: walking, Time (Minutes): 30, Frequency (Times/Week): 4, Weekly Exercise (Minutes/Week): 120, Intensity: Mild, Exercise limited by: None identified   Goals Addressed             This Visit's Progress    Patient Stated       To be healthy      Depression Screen    07/02/2022   11:07 AM 02/01/2020   10:09 AM 10/27/2019    9:55 AM 10/22/2018   10:25 AM 09/15/2018   10:09 AM 05/01/2018   10:30 AM 03/27/2018   10:21 AM  PHQ 2/9 Scores  PHQ - 2 Score 0 3 0 0 0 0 0  PHQ- 9 Score  8         Fall Risk    07/02/2022   11:07 AM 06/29/2022    3:16 PM 02/01/2020   10:08 AM 10/27/2019    9:57 AM 08/27/2019   12:09 PM  Fall Risk   Falls in the past year? 0 0 1 1 1   Number falls in past yr: 0  1 1 1   Comment   falls on knees  Mechanical falls  Injury with Fall? 0 0 0 0 0  Risk for fall due to : No Fall Risks      Follow up Falls evaluation completed        FALL RISK PREVENTION PERTAINING TO THE HOME:  Any stairs in or around the home? Yes  If so, are there any without handrails? Yes  Home free of loose throw rugs in walkways, pet beds, electrical cords, etc? Yes  Adequate lighting in your home to reduce risk of falls? Yes   ASSISTIVE DEVICES UTILIZED TO PREVENT FALLS:  Life alert? No   Use of a cane, walker or w/c? No  Grab bars in the bathroom? No  Shower chair or bench in shower? Yes  Elevated toilet seat or a handicapped toilet? No   TIMED UP AND GO:  Was the test performed? No .    Cognitive Function:    07/02/2022   11:03 AM 10/22/2018   10:29 AM 10/14/2017   10:41 AM 08/24/2016  10:04 AM 07/20/2015    2:43 PM  MMSE - Mini Mental State Exam  Orientation to time 0 5 4 5 5   Orientation to Place 5 5 5 4 5   Registration 3 3 3 3 3   Attention/ Calculation 5 5 5 5 5   Recall 2 3 3 3 3   Language- name 2 objects 2 2 2 2 2   Language- repeat 1 1 1 1 1   Language- follow 3 step command 3 3 3 2 3   Language- read & follow direction 1 1 1 1 1   Write a sentence 1 1 1 1 1   Copy design 1 1 1 1 1   Total score 24 30 29 28 30         10/27/2019   10:02 AM  6CIT Screen  What Year? 0 points  What month? 0 points  What time? 0 points  Count back from 20 0 points  Months in reverse 0 points  Repeat phrase 0 points  Total Score 0 points    Immunizations Immunization History  Administered Date(s) Administered   Fluad Quad(high Dose 65+) 10/22/2018, 02/01/2020   Influenza, High Dose Seasonal PF 03/18/2017   Influenza-Unspecified 02/26/2018   PFIZER(Purple Top)SARS-COV-2 Vaccination 05/24/2019, 06/14/2019, 01/18/2020   Zoster, Live 02/27/1999    TDAP status: Due, Education has been provided regarding the importance of this vaccine. Advised may receive this vaccine at local pharmacy or Health Dept. Aware to provide a copy of the vaccination record if obtained from local pharmacy or Health Dept. Verbalized acceptance and understanding.  Flu Vaccine status: Up to date  Pneumococcal vaccine status: Declined,  Education has been provided regarding the importance of this vaccine but patient still declined. Advised may receive this vaccine at local pharmacy or Health Dept. Aware to provide a copy of the vaccination record if obtained from local pharmacy or Health Dept.  Verbalized acceptance and understanding.   Covid-19 vaccine status: Information provided on how to obtain vaccines.   Qualifies for Shingles Vaccine? Yes   Zostavax completed No   Shingrix Completed?: No.    Education has been provided regarding the importance of this vaccine. Patient has been advised to call insurance company to determine out of pocket expense if they have not yet received this vaccine. Advised may also receive vaccine at local pharmacy or Health Dept. Verbalized acceptance and understanding.  Screening Tests Health Maintenance  Topic Date Due   DTaP/Tdap/Td (1 - Tdap) Never done   COVID-19 Vaccine (4 - 2023-24 season) 07/15/2022 (Originally 10/27/2021)   Zoster Vaccines- Shingrix (1 of 2) 06/05/2023 (Originally 04/03/1949)   Pneumonia Vaccine 45+ Years old (1 of 2 - PCV) 06/29/2023 (Originally 04/02/1936)   INFLUENZA VACCINE  09/27/2022   Medicare Annual Wellness (AWV)  07/02/2023   DEXA SCAN  Completed   HPV VACCINES  Aged Out    Health Maintenance  Health Maintenance Due  Topic Date Due   DTaP/Tdap/Td (1 - Tdap) Never done    Colorectal cancer screening: No longer required.   Mammogram status: No longer required due to aged.  Bone Density status: Ordered today. Pt provided with contact info and advised to call to schedule appt.  Lung Cancer Screening: (Low Dtose CT Chest recommended if Age 59-80 years, 30 pack-year currently smoking OR have quit w/in 15years.) does not qualify.   Lung Cancer Screening Referral: na  Additional Screening:  Hepatitis C Screening: does not qualify; Completed na  Vision Screening: Recommended annual ophthalmology exams for early detection of glaucoma and other  disorders of the eye. Is the patient up to date with their annual eye exam?  No  Who is the provider or what is the name of the office in which the patient attends annual eye exams?none If pt is not established with a provider, would they like to be referred to a provider  to establish care? Yes .   Dental Screening: Recommended annual dental exams for proper oral hygiene  Community Resource Referral / Chronic Care Management: CRR required this visit?  No   CCM required this visit?  No      Plan:     I have personally reviewed and noted the following in the patient's chart:   Medical and social history Use of alcohol, tobacco or illicit drugs  Current medications and supplements including opioid prescriptions. Patient is not currently taking opioid prescriptions. Functional ability and status Nutritional status Physical activity Advanced directives List of other physicians Hospitalizations, surgeries, and ER visits in previous 12 months Vitals Screenings to include cognitive, depression, and falls Referrals and appointments  In addition, I have reviewed and discussed with patient certain preventive protocols, quality metrics, and best practice recommendations. A written personalized care plan for preventive services as well as general preventive health recommendations were provided to patient.     Sharon Seller, NP   07/02/2022   Place of service: [facility]

## 2022-07-03 NOTE — Telephone Encounter (Signed)
Call placed to pt regarding surgical clearance and the need for a tele visit.  Pt actually is scheduled in office with Eligha Bridegroom, N, 07/16/22, and need to verify if pt would like to have her clearance done at that visit.  Left  a message for pt to call back and ask for the preop team.

## 2022-07-03 NOTE — Telephone Encounter (Signed)
   Name: Alice [name]  DOB: 09-18-1930  MRN: [id number]  Primary Cardiologist: Meriam Sprague, MD   Preoperative team, please contact this patient and set up a phone call appointment for further preoperative risk assessment. Please obtain consent and complete medication review. Thank you for your help.  I confirm that guidance regarding antiplatelet and oral anticoagulation therapy has been completed and, if necessary, noted below.  None requested.    Carlos Levering, NP 07/03/2022, 11:19 AM [facility]

## 2022-07-03 NOTE — Telephone Encounter (Signed)
Hi Deborah, I did not have any concerns about unstable angina at the time of the visit, but since it has been > 2 months since I saw her, I would recommend a phone call to ensure  no changes since she was last seen.   Thank you, Marcelino Duster

## 2022-07-04 ENCOUNTER — Encounter: Payer: Self-pay | Admitting: Nurse Practitioner

## 2022-07-05 NOTE — Telephone Encounter (Signed)
Left another msg for pt.

## 2022-07-06 NOTE — Telephone Encounter (Signed)
I tried to call the pt to see if she wanted to set up a tele appt for pre op, though no answer. Pt has in office appt 07/16/22 with Eligha Bridegroom, NP. The first tele pre op appt would not be until 07/12/22. I will update NP for upcoming appt.

## 2022-07-10 NOTE — Progress Notes (Addendum)
Cardiology Office Note:    Date:  07/16/2022   ID:  Alice [name], DOB 1930-12-27, MRN [id number]  PCP:  Sharon Seller, NP   [facility] Providers Cardiologist:  Meriam Sprague, MD     Referring MD: Sharon Seller, NP   Chief Complaint: Preoperative cardiac evaluation  History of Present Illness:    Alice [name] is a very pleasant 89 y.o. female with a hx of HLD, prior tobacco abuse, hypothyroidism, and breast cancer who presented with a STEMI found to have critical left main disease on cath prompting urgent CABG with LIMA to LAD and SVG to OM on 03/19/2022.  She presented to ED on 03/19/22 with chest pain.  She reported progressive chest pain over the previous few days.  About 2 months prior she began to feel discomfort in her chest.  She reported it was mild at first and she did not seek medical attention.  Over the course of the past several weeks the pain has been progressing and became associated with shortness of breath and diaphoresis.  Pain was worse when walking up an incline. In ED, was found to have troponin 116 ? 126 with subtle ST-T and lateral leads.  CXR without acute process.  TTE with LVEF 60 to 65%, G1 DD, normal RV, mild LAE, mild MR, aortic sclerosis.  LHC 1/22 showed 99% critical LM disease prompting emergent CABG.  She had persistent left pleural effusion and atelectasis with a left thoracentesis and 700 cc of fluid removed. Follow-up CXR showed no pneumothorax, persistent small pleural effusions.  Because of consistent desaturations, she was discharged on 2L O2.  She had brief atrial tachycardia (versus a flutter 2-1 block) on Sunday 1/28 and another brief episode of atrial tachycardia and ventricular trigeminy on 1/29.  She was started on metoprolol, cardiology not felt that amiodarone was needed at that time. Was discharged to SNF on 2/6 on metoprolol 37.5 mg twice daily, lisinopril 40 mg daily, ASA 81 mg daily, and rosuvastatin 20 mg daily.  Seen  by me on 04/09/22, here alone from SNF. She is in a wheelchair. Reports she has 3 sons who live in [place] and [place]. When asked about family who come to check on her, she reports she has a friend [place] who comes by. Is looking forward to going back to her home where she has been for 10 years.  She has a stuffed dog with her today that she says is named spot. Says her Bertram [place] terrier is with her son. Prior to hospitalization, she only took levothyroxine. No prior cardiac problems, but both mother and father had heart problems. Was a Manufacturing systems engineer for many years. When asked about cardiac symptoms, she says only that her chest hurts "where they did surgery."  She denies palpitations, shortness of breath, orthopnea, PND, presyncope, syncope.  No bleeding concerns. During exam, asks her stuffed dog if he bit her.   Today, she is here for preoperative cardiac evaluation for upcoming dental extractions. She is accompanied by her friend [place] and her therapy dog Max. Her son is on FaceTime. Reports she has no concerns and is not getting any teeth extracted. Says she is the owner of her teeth. Has stopped all of her cardiac medications, only takes levothyroxine. She lives alone with dog and Sandy checks on her frequently. Says she remains active doing her own housework and gardening. Does not monitor blood pressure at home. She denies chest pain, shortness of breath, lower  extremity edema, fatigue, palpitations, melena, hematuria, hemoptysis, diaphoresis, weakness, presyncope, syncope, orthopnea, and PND.   Past Medical History:  Diagnosis Date   Alopecia    Breast cancer (HCC)     Right , chemo, radiation   DDD (degenerative disc disease)    Heart palpitations    History of diverticulitis    History of diverticulitis of colon    Hypercholesteremia    Hypothyroidism    Insect bites    Internal and external bleeding hemorrhoids 08/11/2015   Personal history of chemotherapy 2012   Personal  history of radiation therapy 2012   Scoliosis    L4 L5   Seasonal allergies    SUI (stress urinary incontinence, female)     Past Surgical History:  Procedure Laterality Date   BLADDER SUSPENSION     bowel rescetion     BOWEL RESECTION     BREAST BIOPSY Right 03/14/2010   BREAST LUMPECTOMY  2012   right   CATARACT EXTRACTION     left   COLON SURGERY  20 YRS AGO   BOWEL RESECTION   CORONARY ARTERY BYPASS GRAFT N/A 03/19/2022   Procedure: CORONARY ARTERY BYPASS GRAFTING (CABG) X TWO, USING LEFT INTERNAL MAMMARY ARTERY AND RIGHT LEG GREATER SAPHENOUS VEIN HARVESTED ENDOSCOPICALLY;  Surgeon: Loreli Slot, MD;  Location: [facility] OR;  Service: Open Heart Surgery;  Laterality: N/A;   EYE SURGERY     HEMORRHOID BANDING     HERNIA REPAIR     RIH, Dr. Zachery Dakins   IABP INSERTION N/A 03/19/2022   Procedure: IABP Insertion;  Surgeon: Runell Gess, MD;  Location: [facility] INVASIVE CV LAB;  Service: Cardiovascular;  Laterality: N/A;   IR THORACENTESIS ASP PLEURAL SPACE W/IMG GUIDE  03/26/2022   JOINT REPLACEMENT     LEFT HEART CATH AND CORONARY ANGIOGRAPHY N/A 03/19/2022   Procedure: LEFT HEART CATH AND CORONARY ANGIOGRAPHY;  Surgeon: Runell Gess, MD;  Location: [facility] INVASIVE CV LAB;  Service: Cardiovascular;  Laterality: N/A;   PORT-A-CATH REMOVAL     TEE WITHOUT CARDIOVERSION N/A 03/19/2022   Procedure: TRANSESOPHAGEAL ECHOCARDIOGRAM;  Surgeon: Loreli Slot, MD;  Location: [facility] OR;  Service: Open Heart Surgery;  Laterality: N/A;   TONSILLECTOMY  2001   Diverticulitis   TOTAL HIP ARTHROPLASTY Right 09/10/2014   Procedure: RIGHT TOTAL HIP ARTHROPLASTY ANTERIOR APPROACH;  Surgeon: Kathryne Hitch, MD;  Location: [facility] ORS;  Service: Orthopedics;  Laterality: Right;    Current Medications: Current Meds  Medication Sig   levothyroxine (SYNTHROID) 100 MCG tablet Take 1 tablet (100 mcg total) by mouth daily.     Allergies:   Amoxicillin, Diphenhydramine, Naproxen sodium, Lipitor  [atorvastatin], Meclizine, Amlodipine, Cefdinir, Codeine, Ferrous sulfate, Methocarbamol, Prednisone, Skelaxin [metaxalone], Tramadol, and Hctz [hydrochlorothiazide]   Social History   Socioeconomic History   Marital status: Divorced    Spouse name: Not on file   Number of children: 3   Years of education: Not on file   Highest education level: Not on file  Occupational History   Occupation: Retired    Associate Professor: RETIRED  Tobacco Use   Smoking status: Former    Types: Cigarettes    Quit date: 08/09/1964    Years since quitting: 57.9   Smokeless tobacco: Never  Vaping Use   Vaping Use: Never used  Substance and Sexual Activity   Alcohol use: Yes    Comment: RARE   Drug use: No   Sexual activity: Not Currently  Other Topics Concern   Not  on file  Social History Narrative   Tobacco use, amount per day now: N/A      Past tobacco use, amount per day: 1 pack, Quit 1969      How many years did you use tobacco: N/A      Alcohol use (drinks per week): Occassionally. Glass of wine- Holidays      Diet: N/A      Do you drink/eat things with caffeine? Coffee and Tea      Marital status: Divorced             What year were you married?      Do you live in a house, apartment, assisted living, condo, trailer? Condo      Is it one or more stories? One      How many persons live in your home? Self and Service Dog      Do you have any pets in your home? 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 Walking            How often? Usually daily 2-4 times      Do you have a living will? Yes      Do you have a DNR form? N/A            If not, do you want to discuss one? N/A      Do you have signed POA/HPOA forms? No                Social Determinants of Health   Financial Resource Strain: Low Risk  (10/14/2017)   Overall Financial Resource Strain (CARDIA)    Difficulty of Paying Living Expenses: Not hard at all  Food  Insecurity: No Food Insecurity (10/14/2017)   Hunger Vital Sign    Worried About Running Out of Food in the Last Year: Never true    Ran Out of Food in the Last Year: Never true  Transportation Needs: No Transportation Needs (10/14/2017)   PRAPARE - Administrator, Civil Service (Medical): No    Lack of Transportation (Non-Medical): No  Physical Activity: Sufficiently Active (10/14/2017)   Exercise Vital Sign    Days of Exercise per Week: 7 days    Minutes of Exercise per Session: 90 min  Stress: Stress Concern Present (10/14/2017)   [employer] of Occupational Health - Occupational Stress Questionnaire    Feeling of Stress : To some extent  Social Connections: Somewhat Isolated (10/14/2017)   Social Connection and Isolation Panel [NHANES]    Frequency of Communication with Friends and Family: More than three times a week    Frequency of Social Gatherings with Friends and Family: Three times a week    Attends Religious Services: Never    Active Member of Clubs or Organizations: Yes    Attends Engineer, structural: More than 4 times per year    Marital Status: Divorced     Family History: The patient's family history includes Asthma in her son, son, and son; CVA (age of onset: 1) in her father; Cancer (age of onset: 65) in her sister; Colon cancer in an other family member; Heart disease in her father and mother; Skin cancer (age of onset: 64) in her brother.  ROS:   Please see the history of present illness.   All other systems reviewed and are negative.  Labs/Other Studies Reviewed:  The following studies were reviewed today:  LHC 03/19/22   Mid LM to Dist LM lesion is 99% stenosed.   Prox RCA lesion is 90% stenosed.   Mid LAD lesion is 60% stenosed.  IMPRESSION: Ms. Fonseca has left main/three-vessel disease.  She is having active angina.  I placed an intra-aortic balloon pump.  I bolused her with 7000 units of heparin and placed her on a heparin drip.   Dr. Dorris Fetch has seen her and has agreed to take her for coronary artery bypass graft grafting urgently.   Diagnostic Dominance: Right  Intervention   Recent Labs: 03/26/2022: Magnesium 1.8 04/09/2022: Hemoglobin 12.1; Platelets 369 05/03/2022: BUN 20; Creat 0.88; Potassium 4.2; Sodium 141; TSH 0.13  Recent Lipid Panel    Component Value Date/Time   CHOL 143 05/03/2022 0843   TRIG 71 05/03/2022 0843   HDL 66 05/03/2022 0843   CHOLHDL 2.2 05/03/2022 0843   VLDL 8 03/19/2022 0815   LDLCALC 62 05/03/2022 0843     Risk Assessment/Calculations:      Physical Exam:    VS:  BP (!) 140/82   Pulse 69   Ht 5\' 2"  (1.575 m)   SpO2 93%   BMI 22.06 kg/m     Wt Readings from Last 3 Encounters:  07/02/22 120 lb 9.6 oz (54.7 kg)  06/29/22 122 lb (55.3 kg)  04/24/22 121 lb (54.9 kg)     GEN:  Thin, well developed in no acute distress HEENT: Normal NECK: No JVD; No carotid bruits CARDIAC: RRR, no murmurs, rubs, gallops RESPIRATORY:  Clear to auscultation without rales, wheezing or rhonchi  ABDOMEN: Soft, non-tender, non-distended MUSCULOSKELETAL:  No edema; No deformity. 2+ pedal pulses, equal bilaterally SKIN: Warm and dry. Sternotomy incision has well approximated edges and is clean and dry NEUROLOGIC:  Alert and oriented, answers some questions appropriately but is not aware of some details of current or past history PSYCHIATRIC:  Normal affect   EKG:  EKG is ordered today.  The ekg ordered today demonstrates sinus bradycardia at 58 bpm with PVC, incomplete RBBB   Diagnoses:    1. Preoperative cardiovascular examination   2. Essential hypertension   3. Hyperlipidemia LDL goal <70   4. Coronary artery disease involving native coronary artery of native heart without angina pectoris   5. S/P CABG x 2   6. Medication management     Assessment and Plan:     Preoperative cardiac evaluation: According to the Revised Cardiac Risk Index (RCRI), her Perioperative Risk of  Major Cardiac Event is (%): 0.9. Her Functional Capacity in METs is: 6.61 according to the Duke Activity Status Index (DASI). The patient is doing well from a cardiac perspective. Therefore, based on ACC/AHA guidelines, the patient would be at acceptable risk for the planned procedure without further cardiovascular testing. No longer on any anti-platelet medications.  She does not require SBE prophylaxis.  Will forward clearance to requesting provider.   CAD s/p CABG: S/p NSTEMI with critical left main disease requiring IABP placement who underwent CABG x 2 03/19/22. She denies chest pain, dyspnea, or other symptoms concerning for angina. She discontinued her aspirin, metoprolol, and atorvastatin. Reviewed risks of being off anti-platelet, cholesterol, and beta blockers with patient, friend and son who all verbalized understanding.   Medication management: She has discontinued all cardiac medications.  Reviewed risks of being off cardiac medications with patient and family who all verbalized understanding.   Hyperlipidemia LDL goal < 55: LDL 62 on 37/24. No longer  on statin medication. Encouraged her to remain active and follow-up heart healthy, mostly plant based diet.    Hypertension: BP is elevated and remains so on my recheck. She is no longer on any cardiac medications. Encouraged her to notify us if BP consistently > 140/90.      Disposition: 6 months with Dr. Shari Prows or APP  Medication Adjustments/Labs and Tests Ordered: Current medicines are reviewed at length with the patient today.  Concerns regarding medicines are outlined above.  Orders Placed This Encounter  Procedures   EKG 12-Lead   No orders of the defined types were placed in this encounter.   Patient Instructions  Medication Instructions:   Your physician recommends that you continue on your current medications as directed. Please refer to the Current Medication list given to you today.   *If you need a refill on your  cardiac medications before your next appointment, please call your pharmacy*   Lab Work:  None ordered.  If you have labs (blood work) drawn today and your tests are completely normal, you will receive your results only by: MyChart Message (if you have MyChart) OR A paper copy in the mail If you have any lab test that is abnormal or we need to change your treatment, we will call you to review the results.   Testing/Procedures:  None ordered.   Follow-Up: At [facility], you and your health needs are our priority.  As part of our continuing mission to provide you with exceptional heart care, we have created designated Provider Care Teams.  These Care Teams include your primary Cardiologist (physician) and Advanced Practice Providers (APPs -  Physician Assistants and Nurse Practitioners) who all work together to provide you with the care you need, when you need it.  We recommend signing up for the patient portal called "MyChart".  Sign up information is provided on this After Visit Summary.  MyChart is used to connect with patients for Virtual Visits (Telemedicine).  Patients are able to view lab/test results, encounter notes, upcoming appointments, etc.  Non-urgent messages can be sent to your provider as well.   To learn more about what you can do with MyChart, go to [email or web address].    Your next appointment:   6 month(s)  Provider:   Eligha Bridegroom, NP         Other Instructions  Your physician wants you to follow-up in: 6 months with Lebron Conners You will receive a reminder letter in the mail two months in advance. If you don't receive a letter, please call our office to schedule the follow-up appointment.     Signed, Levi Aland, NP  07/16/2022 3:29 PM    [facility]

## 2022-07-16 ENCOUNTER — Encounter: Payer: Self-pay | Admitting: Nurse Practitioner

## 2022-07-16 ENCOUNTER — Ambulatory Visit: Payer: Medicare PPO | Attending: Nurse Practitioner | Admitting: Nurse Practitioner

## 2022-07-16 VITALS — BP 140/82 | HR 69 | Ht 62.0 in

## 2022-07-16 DIAGNOSIS — E785 Hyperlipidemia, unspecified: Secondary | ICD-10-CM

## 2022-07-16 DIAGNOSIS — I10 Essential (primary) hypertension: Secondary | ICD-10-CM | POA: Diagnosis not present

## 2022-07-16 DIAGNOSIS — Z79899 Other long term (current) drug therapy: Secondary | ICD-10-CM

## 2022-07-16 DIAGNOSIS — Z01810 Encounter for preprocedural cardiovascular examination: Secondary | ICD-10-CM

## 2022-07-16 DIAGNOSIS — I2510 Atherosclerotic heart disease of native coronary artery without angina pectoris: Secondary | ICD-10-CM | POA: Diagnosis not present

## 2022-07-16 DIAGNOSIS — Z951 Presence of aortocoronary bypass graft: Secondary | ICD-10-CM

## 2022-07-16 NOTE — Patient Instructions (Signed)
Medication Instructions:   Your physician recommends that you continue on your current medications as directed. Please refer to the Current Medication list given to you today.   *If you need a refill on your cardiac medications before your next appointment, please call your pharmacy*   Lab Work:  None ordered.  If you have labs (blood work) drawn today and your tests are completely normal, you will receive your results only by: MyChart Message (if you have MyChart) OR A paper copy in the mail If you have any lab test that is abnormal or we need to change your treatment, we will call you to review the results.   Testing/Procedures:  None ordered.   Follow-Up: At [facility], you and your health needs are our priority.  As part of our continuing mission to provide you with exceptional heart care, we have created designated Provider Care Teams.  These Care Teams include your primary Cardiologist (physician) and Advanced Practice Providers (APPs -  Physician Assistants and Nurse Practitioners) who all work together to provide you with the care you need, when you need it.  We recommend signing up for the patient portal called "MyChart".  Sign up information is provided on this After Visit Summary.  MyChart is used to connect with patients for Virtual Visits (Telemedicine).  Patients are able to view lab/test results, encounter notes, upcoming appointments, etc.  Non-urgent messages can be sent to your provider as well.   To learn more about what you can do with MyChart, go to [email or web address].    Your next appointment:   6 month(s)  Provider:   Eligha Bridegroom, NP         Other Instructions  Your physician wants you to follow-up in: 6 months with Lebron Conners You will receive a reminder letter in the mail two months in advance. If you don't receive a letter, please call our office to schedule the follow-up appointment.

## 2022-07-20 ENCOUNTER — Ambulatory Visit: Payer: TRICARE For Life (TFL) | Admitting: Nurse Practitioner

## 2022-10-31 DIAGNOSIS — H353132 Nonexudative age-related macular degeneration, bilateral, intermediate dry stage: Secondary | ICD-10-CM | POA: Diagnosis not present

## 2022-10-31 DIAGNOSIS — Q141 Congenital malformation of retina: Secondary | ICD-10-CM | POA: Diagnosis not present

## 2022-10-31 DIAGNOSIS — H35363 Drusen (degenerative) of macula, bilateral: Secondary | ICD-10-CM | POA: Diagnosis not present

## 2022-10-31 DIAGNOSIS — D3132 Benign neoplasm of left choroid: Secondary | ICD-10-CM | POA: Diagnosis not present

## 2022-11-13 ENCOUNTER — Encounter (INDEPENDENT_AMBULATORY_CARE_PROVIDER_SITE_OTHER): Payer: Medicare PPO | Admitting: Ophthalmology

## 2022-11-13 DIAGNOSIS — H353112 Nonexudative age-related macular degeneration, right eye, intermediate dry stage: Secondary | ICD-10-CM | POA: Diagnosis not present

## 2022-11-13 DIAGNOSIS — H43813 Vitreous degeneration, bilateral: Secondary | ICD-10-CM

## 2022-11-13 DIAGNOSIS — D3132 Benign neoplasm of left choroid: Secondary | ICD-10-CM

## 2023-01-07 ENCOUNTER — Ambulatory Visit: Payer: TRICARE For Life (TFL) | Admitting: Nurse Practitioner

## 2023-01-08 ENCOUNTER — Ambulatory Visit: Payer: TRICARE For Life (TFL) | Admitting: Nurse Practitioner

## 2023-01-14 ENCOUNTER — Ambulatory Visit (INDEPENDENT_AMBULATORY_CARE_PROVIDER_SITE_OTHER): Payer: Medicare PPO | Admitting: Nurse Practitioner

## 2023-01-14 ENCOUNTER — Encounter: Payer: Self-pay | Admitting: Nurse Practitioner

## 2023-01-14 VITALS — BP 122/87 | HR 70 | Temp 97.3°F | Resp 18 | Ht 62.0 in

## 2023-01-14 DIAGNOSIS — E782 Mixed hyperlipidemia: Secondary | ICD-10-CM | POA: Diagnosis not present

## 2023-01-14 DIAGNOSIS — R413 Other amnesia: Secondary | ICD-10-CM

## 2023-01-14 DIAGNOSIS — R739 Hyperglycemia, unspecified: Secondary | ICD-10-CM | POA: Diagnosis not present

## 2023-01-14 DIAGNOSIS — I25118 Atherosclerotic heart disease of native coronary artery with other forms of angina pectoris: Secondary | ICD-10-CM

## 2023-01-14 DIAGNOSIS — Z23 Encounter for immunization: Secondary | ICD-10-CM | POA: Diagnosis not present

## 2023-01-14 DIAGNOSIS — E039 Hypothyroidism, unspecified: Secondary | ICD-10-CM

## 2023-01-14 NOTE — Progress Notes (Signed)
Careteam: Patient Care Team: Sharon Seller, NP as PCP - General (Geriatric Medicine) Meriam Sprague, MD (Inactive) as PCP - Cardiology (Cardiology) Kathryne Hitch, MD as Consulting Physician (Orthopedic Surgery) Magrinat, Valentino Hue, MD (Inactive) as Consulting Physician (Oncology)  PLACE OF SERVICE:  [facility]  Advanced Directive information Does Patient Have a Medical Advance Directive?: Yes, Type of Advance Directive: Healthcare Power of Oval;Living will;Out of facility DNR (pink MOST or yellow form), Pre-existing out of facility DNR order (yellow form or pink MOST form): Yellow form placed in chart (order not valid for inpatient use), Does patient want to make changes to medical advance directive?: No - Patient declined  Allergies  Allergen Reactions   Amoxicillin Rash   Diphenhydramine Rash    "It is the red dye in Benadryl that I''m allergic to"   Naproxen Sodium Rash    "It is the blue dye in naproxen that I'm allergic to."   Lipitor [Atorvastatin]     Unknown reaction   Meclizine Other (See Comments)    Worsening dizziness and blurred vision   Amlodipine Other (See Comments)    Dizzy   Cefdinir Other (See Comments)    Vertigo and headaches    Codeine Other (See Comments)    unknown   Ferrous Sulfate     "tears belly up"   Methocarbamol Other (See Comments)    insomnia    Prednisone Other (See Comments)    vertigo and headaches   Skelaxin [Metaxalone] Other (See Comments)    Gastric upset.    Tramadol Nausea Only   Hctz [Hydrochlorothiazide] Itching    Chief Complaint  Patient presents with   Medicare Wellness    6 month follow-up. Discuss need for td/tdap, flu vaccine, and covid booster. [facility] verified.      HPI: Patient is a 89 y.o. female for follow up.  She is worried because her dog is not in the appt with her- her friend brought her to appt today but she in visit alone.  She reports she does drive occasionally but did not  drive today Her children do not live close.  She has no concerns today (except wondering where her dog is)  She reports she does not take any medication - and does not want to.  Did not wish to take statin after MI to reduce risk of another event.  She states she takes an asa if she has a headache but does not take this routinely Not prone to headaches "unless my grandchildren come to visit"  Does not have any pain.   Reports she eats regularly- she reports she cooks.    Review of Systems:  Review of Systems  Constitutional:  Negative for chills, fever and weight loss.  HENT:  Negative for tinnitus.   Respiratory:  Negative for cough, sputum production and shortness of breath.   Cardiovascular:  Negative for chest pain, palpitations and leg swelling.  Gastrointestinal:  Negative for abdominal pain, constipation, diarrhea and heartburn.  Genitourinary:  Negative for dysuria, frequency and urgency.  Musculoskeletal:  Negative for back pain, falls, joint pain and myalgias.  Skin: Negative.   Neurological:  Negative for dizziness and headaches.  Psychiatric/Behavioral:  Positive for memory loss. Negative for depression. The patient does not have insomnia.     Past Medical History:  Diagnosis Date   Alopecia    Breast cancer (HCC)     Right , chemo, radiation   DDD (degenerative disc disease)  Heart palpitations    History of diverticulitis    History of diverticulitis of colon    Hypercholesteremia    Hypothyroidism    Insect bites    Internal and external bleeding hemorrhoids 08/11/2015   Personal history of chemotherapy 2012   Personal history of radiation therapy 2012   Scoliosis    L4 L5   Seasonal allergies    SUI (stress urinary incontinence, female)    Past Surgical History:  Procedure Laterality Date   BLADDER SUSPENSION     bowel rescetion     BOWEL RESECTION     BREAST BIOPSY Right 03/14/2010   BREAST LUMPECTOMY  2012   right   CATARACT EXTRACTION      left   COLON SURGERY  20 YRS AGO   BOWEL RESECTION   CORONARY ARTERY BYPASS GRAFT N/A 03/19/2022   Procedure: CORONARY ARTERY BYPASS GRAFTING (CABG) X TWO, USING LEFT INTERNAL MAMMARY ARTERY AND RIGHT LEG GREATER SAPHENOUS VEIN HARVESTED ENDOSCOPICALLY;  Surgeon: Loreli Slot, MD;  Location: [facility] OR;  Service: Open Heart Surgery;  Laterality: N/A;   EYE SURGERY     HEMORRHOID BANDING     HERNIA REPAIR     RIH, Dr. Zachery Dakins   IABP INSERTION N/A 03/19/2022   Procedure: IABP Insertion;  Surgeon: Runell Gess, MD;  Location: [facility] INVASIVE CV LAB;  Service: Cardiovascular;  Laterality: N/A;   IR THORACENTESIS ASP PLEURAL SPACE W/IMG GUIDE  03/26/2022   JOINT REPLACEMENT     LEFT HEART CATH AND CORONARY ANGIOGRAPHY N/A 03/19/2022   Procedure: LEFT HEART CATH AND CORONARY ANGIOGRAPHY;  Surgeon: Runell Gess, MD;  Location: [facility] INVASIVE CV LAB;  Service: Cardiovascular;  Laterality: N/A;   PORT-A-CATH REMOVAL     TEE WITHOUT CARDIOVERSION N/A 03/19/2022   Procedure: TRANSESOPHAGEAL ECHOCARDIOGRAM;  Surgeon: Loreli Slot, MD;  Location: [facility] OR;  Service: Open Heart Surgery;  Laterality: N/A;   TONSILLECTOMY  2001   Diverticulitis   TOTAL HIP ARTHROPLASTY Right 09/10/2014   Procedure: RIGHT TOTAL HIP ARTHROPLASTY ANTERIOR APPROACH;  Surgeon: Kathryne Hitch, MD;  Location: [facility] ORS;  Service: Orthopedics;  Laterality: Right;   Social History:   reports that she quit smoking about 58 years ago. Her smoking use included cigarettes. She has never used smokeless tobacco. She reports current alcohol use. She reports that she does not use drugs.  Family History  Problem Relation Age of Onset   Heart disease Mother    Heart disease Father    CVA Father 18   Cancer Sister 61       lymphoma   Asthma Son    Asthma Son    Asthma Son    Skin cancer Brother 84       melanoma   Colon cancer Other     Medications: Patient's Medications  New Prescriptions   No medications on  file  Previous Medications   LEVOTHYROXINE (SYNTHROID) 100 MCG TABLET    Take 1 tablet (100 mcg total) by mouth daily.  Modified Medications   No medications on file  Discontinued Medications   No medications on file    Physical Exam:  Vitals:   01/14/23 0839  BP: 122/87  Pulse: 70  Resp: 18  Temp: (!) 97.3 F (36.3 C)  SpO2: 90%  Height: 5\' 2"  (1.575 m)   Body mass index is 22.06 kg/m. Wt Readings from Last 3 Encounters:  07/02/22 120 lb 9.6 oz (54.7 kg)  06/29/22 122 lb (55.3 kg)  04/24/22 121 lb (54.9 kg)    Physical Exam Constitutional:      General: She is not in acute distress.    Appearance: She is well-developed. She is not diaphoretic.  HENT:     Head: Normocephalic and atraumatic.     Mouth/Throat:     Pharynx: No oropharyngeal exudate.  Eyes:     Conjunctiva/sclera: Conjunctivae normal.     Pupils: Pupils are equal, round, and reactive to light.  Cardiovascular:     Rate and Rhythm: Normal rate and regular rhythm.     Heart sounds: Normal heart sounds.  Pulmonary:     Effort: Pulmonary effort is normal.     Breath sounds: Normal breath sounds.  Abdominal:     General: Bowel sounds are normal.     Palpations: Abdomen is soft.  Musculoskeletal:     Cervical back: Normal range of motion and neck supple.     Right lower leg: No edema.     Left lower leg: No edema.  Skin:    General: Skin is warm and dry.  Neurological:     Mental Status: She is alert.  Psychiatric:        Mood and Affect: Mood normal.     Labs reviewed: Basic Metabolic Panel: Recent Labs    03/20/22 0335 03/20/22 0652 03/20/22 1819 03/21/22 0424 03/26/22 0103 03/27/22 0650 03/29/22 0858 04/03/22 0704 04/09/22 1029 05/03/22 0843  NA 141   < > 140   < > 137   < > 137  --  142 141  K 3.8   < > 3.9   < > 3.6   < > 4.0  --  4.5 4.2  CL 110  --  110   < > 94*   < > 94*  --  103 100  CO2 25  --  23   < > 32   < > 32  --  26 31  GLUCOSE 97  --  126*   < > 122*   < > 113*   --  108* 104*  BUN 15  --  14   < > 18   < > 19  --  22 20  CREATININE 0.78  --  0.81   < > 0.91   < > 0.92 1.04* 1.05* 0.88  CALCIUM 8.0*  --  8.4*   < > 8.5*   < > 8.8*  --  9.3 9.3  MG 3.1*  --  2.3  --  1.8  --   --   --   --   --   TSH  --   --   --   --   --   --   --   --   --  0.13*   < > = values in this interval not displayed.   Liver Function Tests: No results for input(s): "AST", "ALT", "ALKPHOS", "BILITOT", "PROT", "ALBUMIN" in the last 8760 hours. No results for input(s): "LIPASE", "AMYLASE" in the last 8760 hours. No results for input(s): "AMMONIA" in the last 8760 hours. CBC: Recent Labs    03/19/22 0628 03/19/22 1800 03/23/22 0221 03/24/22 1559 04/09/22 1029  WBC 9.1   < > 9.1 9.8 8.3  NEUTROABS 4.9  --   --   --   --   HGB 12.5   < > 8.2* 9.1* 12.1  HCT 40.0   < > 26.2* 28.9* 38.1  MCV 88.9   < > 90.3 90.3 93  PLT 252   < > 132* 202 369   < > = values in this interval not displayed.   Lipid Panel: Recent Labs    03/19/22 0815 05/03/22 0843  CHOL 238* 143  HDL 80 66  LDLCALC 150* 62  TRIG 42 71  CHOLHDL 3.0 2.2   TSH: Recent Labs    05/03/22 0843  TSH 0.13*   A1C: Lab Results  Component Value Date   HGBA1C 5.7 (H) 03/19/2022     Assessment/Plan 1. Need for influenza vaccination - Flu Vaccine Trivalent High Dose (Fluad)  2. Coronary artery disease involving native coronary artery of native heart with other form of angina pectoris (HCC) No chest pain, shortness of breath, LE edema.  She does not wish to take medication to reduce risk of CV event.  - COMPLETE METABOLIC PANEL WITH GFR - CBC with Differential/Platelet  3. Acquired hypothyroidism Not taking medication at this time.  - TSH  4. Mixed hyperlipidemia -reports she is not willing to take medication for cholesterol. Continues to work on dietary modifications.   5. Hyperglycemia -continue dietary modifications  - Hemoglobin A1c  6. Memory loss -ongoing, has support of  friend and family.     Return in about 6 months (around 07/14/2023) for routine follow up .  Janene Harvey. Biagio Borg [facility] [phone]

## 2023-01-15 LAB — HEMOGLOBIN A1C
Hgb A1c MFr Bld: 5.9 %{Hb} — ABNORMAL HIGH (ref <5.7)
Mean Plasma Glucose: 123 mg/dL
eAG (mmol/L): 6.8 mmol/L

## 2023-01-15 LAB — COMPLETE METABOLIC PANEL WITH GFR
AG Ratio: 1.6 (calc) (ref 1.0–2.5)
ALT: 8 U/L (ref 6–29)
AST: 14 U/L (ref 10–35)
Albumin: 4.3 g/dL (ref 3.6–5.1)
Alkaline phosphatase (APISO): 55 U/L (ref 37–153)
BUN: 25 mg/dL (ref 7–25)
CO2: 32 mmol/L (ref 20–32)
Calcium: 9.9 mg/dL (ref 8.6–10.4)
Chloride: 102 mmol/L (ref 98–110)
Creat: 0.88 mg/dL (ref 0.60–0.95)
Globulin: 2.7 g/dL (ref 1.9–3.7)
Glucose, Bld: 95 mg/dL (ref 65–99)
Potassium: 4.6 mmol/L (ref 3.5–5.3)
Sodium: 141 mmol/L (ref 135–146)
Total Bilirubin: 0.5 mg/dL (ref 0.2–1.2)
Total Protein: 7 g/dL (ref 6.1–8.1)
eGFR: 62 mL/min/{1.73_m2} (ref >=60)

## 2023-01-15 LAB — CBC WITH DIFFERENTIAL/PLATELET
Absolute Lymphocytes: 3613 {cells}/uL (ref 850–3900)
Absolute Monocytes: 792 {cells}/uL (ref 200–950)
Basophils Absolute: 71 {cells}/uL (ref 0–200)
Basophils Relative: 0.8 %
Eosinophils Absolute: 249 {cells}/uL (ref 15–500)
Eosinophils Relative: 2.8 %
HCT: 41 % (ref 35.0–45.0)
Hemoglobin: 13.1 g/dL (ref 11.7–15.5)
MCH: 27.3 pg (ref 27.0–33.0)
MCHC: 32 g/dL (ref 32.0–36.0)
MCV: 85.6 fL (ref 80.0–100.0)
MPV: 11.1 fL (ref 7.5–12.5)
Monocytes Relative: 8.9 %
Neutro Abs: 4174 {cells}/uL (ref 1500–7800)
Neutrophils Relative %: 46.9 %
Platelets: 250 10*3/uL (ref 140–400)
RBC: 4.79 10*6/uL (ref 3.80–5.10)
RDW: 14.8 % (ref 11.0–15.0)
Total Lymphocyte: 40.6 %
WBC: 8.9 10*3/uL (ref 3.8–10.8)

## 2023-01-15 LAB — TSH: TSH: 1.53 m[IU]/L (ref 0.40–4.50)

## 2023-01-27 ENCOUNTER — Encounter: Payer: Self-pay | Admitting: Nurse Practitioner

## 2023-01-28 NOTE — Telephone Encounter (Signed)
Message left on patient's voicemail to give our office a call to schedule an appointment.   Message also sent to patient in MyChart.

## 2023-02-05 ENCOUNTER — Ambulatory Visit (INDEPENDENT_AMBULATORY_CARE_PROVIDER_SITE_OTHER): Payer: Medicare PPO | Admitting: Sports Medicine

## 2023-02-05 ENCOUNTER — Encounter: Payer: Self-pay | Admitting: Sports Medicine

## 2023-02-05 VITALS — BP 122/78 | HR 83 | Temp 97.5°F | Resp 18 | Ht 63.0 in | Wt 128.4 lb

## 2023-02-05 DIAGNOSIS — M545 Low back pain, unspecified: Secondary | ICD-10-CM | POA: Diagnosis not present

## 2023-02-05 DIAGNOSIS — N644 Mastodynia: Secondary | ICD-10-CM

## 2023-02-05 DIAGNOSIS — G8929 Other chronic pain: Secondary | ICD-10-CM

## 2023-02-05 MED ORDER — FEXOFENADINE HCL 60 MG PO TABS: 60.0000 mg | ORAL_TABLET | Freq: Two times a day (BID) | ORAL | 0 refills | Status: DC

## 2023-02-05 NOTE — Progress Notes (Signed)
Careteam: Patient Care Team: Sharon Seller, NP as PCP - General (Geriatric Medicine) Meriam Sprague, MD (Inactive) as PCP - Cardiology (Cardiology) Kathryne Hitch, MD as Consulting Physician (Orthopedic Surgery) Magrinat, Valentino Hue, MD (Inactive) as Consulting Physician (Oncology)  PLACE OF SERVICE:  [facility] CLINIC  Advanced Directive information    Allergies  Allergen Reactions   Amoxicillin Rash   Diphenhydramine Rash    "It is the red dye in Benadryl that I''m allergic to"   Naproxen Sodium Rash    "It is the blue dye in naproxen that I'm allergic to."   Lipitor [Atorvastatin]     Unknown reaction   Meclizine Other (See Comments)    Worsening dizziness and blurred vision   Amlodipine Other (See Comments)    Dizzy   Cefdinir Other (See Comments)    Vertigo and headaches    Codeine Other (See Comments)    unknown   Ferrous Sulfate     "tears belly up"   Methocarbamol Other (See Comments)    insomnia    Prednisone Other (See Comments)    vertigo and headaches   Skelaxin [Metaxalone] Other (See Comments)    Gastric upset.    Tramadol Nausea Only   Hctz [Hydrochlorothiazide] Itching    Chief Complaint  Patient presents with   Acute Visit    Patient complains of sharp pain in right breast.      HPI: Patient is a 89 y.o. female presented to clinic for acute visit for Rt breast pain States that its a minimal pain mostly when she lays down  Off and on  Denies  nipple discharge or bumps No recent trauma Denies cough, son, joint pains, sob, abdominal pain, nausea, vomiting, dysuria, hematuria, dark or bloody stools.  Low back pain  Intermittent  Not in [pain currently  Able to ambulate with no pain    Review of Systems:  Review of Systems  Constitutional:  Negative for chills and fever.  HENT:  Negative for congestion and sore throat.   Eyes:  Negative for double vision.  Respiratory:  Negative for cough, sputum production and shortness of  breath.   Cardiovascular:  Negative for chest pain, palpitations and leg swelling.  Gastrointestinal:  Negative for abdominal pain, heartburn and nausea.  Genitourinary:  Negative for dysuria, frequency and hematuria.  Musculoskeletal:  Negative for falls and myalgias.       Rt breast  pain intermittent pain   Neurological:  Negative for dizziness, sensory change and focal weakness.   Negative unless indicated in HPI.   Past Medical History:  Diagnosis Date   Alopecia    Breast cancer (HCC)     Right , chemo, radiation   DDD (degenerative disc disease)    Heart palpitations    History of diverticulitis    History of diverticulitis of colon    Hypercholesteremia    Hypothyroidism    Insect bites    Internal and external bleeding hemorrhoids 08/11/2015   Personal history of chemotherapy 2012   Personal history of radiation therapy 2012   Scoliosis    L4 L5   Seasonal allergies    SUI (stress urinary incontinence, female)    Past Surgical History:  Procedure Laterality Date   BLADDER SUSPENSION     bowel rescetion     BOWEL RESECTION     BREAST BIOPSY Right 03/14/2010   BREAST LUMPECTOMY  2012   right   CATARACT EXTRACTION     left  COLON SURGERY  20 YRS AGO   BOWEL RESECTION   CORONARY ARTERY BYPASS GRAFT N/A 03/19/2022   Procedure: CORONARY ARTERY BYPASS GRAFTING (CABG) X TWO, USING LEFT INTERNAL MAMMARY ARTERY AND RIGHT LEG GREATER SAPHENOUS VEIN HARVESTED ENDOSCOPICALLY;  Surgeon: Loreli Slot, MD;  Location: [facility] OR;  Service: Open Heart Surgery;  Laterality: N/A;   EYE SURGERY     HEMORRHOID BANDING     HERNIA REPAIR     RIH, Dr. Zachery Dakins   IABP INSERTION N/A 03/19/2022   Procedure: IABP Insertion;  Surgeon: Runell Gess, MD;  Location: [facility] INVASIVE CV LAB;  Service: Cardiovascular;  Laterality: N/A;   IR THORACENTESIS ASP PLEURAL SPACE W/IMG GUIDE  03/26/2022   JOINT REPLACEMENT     LEFT HEART CATH AND CORONARY ANGIOGRAPHY N/A 03/19/2022   Procedure:  LEFT HEART CATH AND CORONARY ANGIOGRAPHY;  Surgeon: Runell Gess, MD;  Location: [facility]VASIVE CV LAB;  Service: Cardiovascular;  Laterality: N/A;   PORT-A-CATH REMOVAL     TEE WITHOUT CARDIOVERSION N/A 03/19/2022   Procedure: TRANSESOPHAGEAL ECHOCARDIOGRAM;  Surgeon: Loreli Slot, MD;  Location: [facility] OR;  Service: Open Heart Surgery;  Laterality: N/A;   TONSILLECTOMY  2001   Diverticulitis   TOTAL HIP ARTHROPLASTY Right 09/10/2014   Procedure: RIGHT TOTAL HIP ARTHROPLASTY ANTERIOR APPROACH;  Surgeon: Kathryne Hitch, MD;  Location: [facility] ORS;  Service: Orthopedics;  Laterality: Right;   Social History:   reports that she quit smoking about 58 years ago. Her smoking use included cigarettes. She has never used smokeless tobacco. She reports current alcohol use. She reports that she does not use drugs.  Family History  Problem Relation Age of Onset   Heart disease Mother    Heart disease Father    CVA Father 80   Cancer Sister 65       lymphoma   Asthma Son    Asthma Son    Asthma Son    Skin cancer Brother 68       melanoma   Colon cancer Other     Medications: Patient's Medications  New Prescriptions   No medications on file  Previous Medications   LEVOTHYROXINE (SYNTHROID) 100 MCG TABLET    Take 1 tablet (100 mcg total) by mouth daily.  Modified Medications   No medications on file  Discontinued Medications   No medications on file    Physical Exam: There were no vitals filed for this visit. There is no height or weight on file to calculate BMI. BP Readings from Last 3 Encounters:  01/14/23 122/87  07/16/22 (!) 140/82  07/02/22 136/70   Wt Readings from Last 3 Encounters:  07/02/22 120 lb 9.6 oz (54.7 kg)  06/29/22 122 lb (55.3 kg)  04/24/22 121 lb (54.9 kg)    Physical Exam Constitutional:      Appearance: Normal appearance.  HENT:     Head: Normocephalic and atraumatic.     Nose:     Comments:   Cardiovascular:     Rate and Rhythm: Normal  rate and regular rhythm.  Pulmonary:     Effort: Pulmonary effort is normal. No respiratory distress.     Breath sounds: Normal breath sounds. No wheezing.  Abdominal:     General: Bowel sounds are normal. There is no distension.     Tenderness: There is no abdominal tenderness. There is no guarding or rebound.     Comments:    Musculoskeletal:        General: No  swelling or tenderness.     Comments: Rt breast - no tenderness on palpation  No nipple drainage  No axillary lymphadenopathy   Low back pain - SLR neg  No spinal or paraspinal tenderness   Neurological:     Mental Status: She is alert. Mental status is at baseline.     Sensory: No sensory deficit.     Motor: No weakness.     Labs reviewed: Basic Metabolic Panel: Recent Labs    03/20/22 0335 03/20/22 0652 03/20/22 1819 03/21/22 0424 03/26/22 0103 03/27/22 0650 04/09/22 1029 05/03/22 0843 01/14/23 1058  NA 141   < > 140   < > 137   < > 142 141 141  K 3.8   < > 3.9   < > 3.6   < > 4.5 4.2 4.6  CL 110  --  110   < > 94*   < > 103 100 102  CO2 25  --  23   < > 32   < > 26 31 32  GLUCOSE 97  --  126*   < > 122*   < > 108* 104* 95  BUN 15  --  14   < > 18   < > 22 20 25   CREATININE 0.78  --  0.81   < > 0.91   < > 1.05* 0.88 0.88  CALCIUM 8.0*  --  8.4*   < > 8.5*   < > 9.3 9.3 9.9  MG 3.1*  --  2.3  --  1.8  --   --   --   --   TSH  --   --   --   --   --   --   --  0.13* 1.53   < > = values in this interval not displayed.   Liver Function Tests: Recent Labs    01/14/23 1058  AST 14  ALT 8  BILITOT 0.5  PROT 7.0   No results for input(s): "LIPASE", "AMYLASE" in the last 8760 hours. No results for input(s): "AMMONIA" in the last 8760 hours. CBC: Recent Labs    03/19/22 0628 03/19/22 1800 03/24/22 1559 04/09/22 1029 01/14/23 1058  WBC 9.1   < > 9.8 8.3 8.9  NEUTROABS 4.9  --   --   --  4,174  HGB 12.5   < > 9.1* 12.1 13.1  HCT 40.0   < > 28.9* 38.1 41.0  MCV 88.9   < > 90.3 93 85.6  PLT 252    < > 202 369 250   < > = values in this interval not displayed.   Lipid Panel: Recent Labs    03/19/22 0815 05/03/22 0843  CHOL 238* 143  HDL 80 66  LDLCALC 150* 62  TRIG 42 71  CHOLHDL 3.0 2.2   TSH: Recent Labs    05/03/22 0843 01/14/23 1058  TSH 0.13* 1.53   A1C: Lab Results  Component Value Date   HGBA1C 5.9 (H) 01/14/2023     Assessment/Plan 1. Breast pain, right Intermittent Rt breast pain  No lumps or bumps No axillary lymphadenopathy  Instructed patient to  monitor   Low back pain with out sciatica  Not in pain currently  No red flag signs Instructed to take tylenol prn for pain    No follow-ups on file.:

## 2023-02-07 ENCOUNTER — Ambulatory Visit: Payer: TRICARE For Life (TFL) | Admitting: Nurse Practitioner

## 2023-02-22 ENCOUNTER — Ambulatory Visit: Payer: TRICARE For Life (TFL) | Admitting: Nurse Practitioner

## 2023-02-26 ENCOUNTER — Encounter: Payer: TRICARE For Life (TFL) | Admitting: Sports Medicine

## 2023-03-01 NOTE — Progress Notes (Signed)
 This encounter was created in error - please disregard.

## 2023-03-19 ENCOUNTER — Encounter: Payer: Self-pay | Admitting: Nurse Practitioner

## 2023-03-19 ENCOUNTER — Other Ambulatory Visit: Payer: Self-pay

## 2023-03-19 MED ORDER — LEVOTHYROXINE SODIUM 100 MCG PO TABS: 100.0000 ug | ORAL_TABLET | Freq: Every day | ORAL | 1 refills | Status: DC

## 2023-06-15 ENCOUNTER — Encounter: Payer: Self-pay | Admitting: Adult Health

## 2023-07-01 ENCOUNTER — Encounter: Payer: Self-pay | Admitting: Adult Health

## 2023-07-09 ENCOUNTER — Encounter: Payer: Self-pay | Admitting: Nurse Practitioner

## 2023-07-09 NOTE — Telephone Encounter (Signed)
 Message routed to Admin.

## 2023-07-15 ENCOUNTER — Ambulatory Visit: Payer: TRICARE For Life (TFL) | Admitting: Nurse Practitioner

## 2023-08-12 ENCOUNTER — Encounter: Payer: Self-pay | Admitting: Nurse Practitioner

## 2023-08-12 ENCOUNTER — Ambulatory Visit (INDEPENDENT_AMBULATORY_CARE_PROVIDER_SITE_OTHER): Admitting: Nurse Practitioner

## 2023-08-12 VITALS — BP 128/74 | HR 80 | Temp 97.4°F | Ht 62.0 in | Wt 128.2 lb

## 2023-08-12 DIAGNOSIS — M545 Low back pain, unspecified: Secondary | ICD-10-CM | POA: Insufficient documentation

## 2023-08-12 DIAGNOSIS — I25118 Atherosclerotic heart disease of native coronary artery with other forms of angina pectoris: Secondary | ICD-10-CM

## 2023-08-12 DIAGNOSIS — R413 Other amnesia: Secondary | ICD-10-CM

## 2023-08-12 DIAGNOSIS — G8929 Other chronic pain: Secondary | ICD-10-CM

## 2023-08-12 DIAGNOSIS — E039 Hypothyroidism, unspecified: Secondary | ICD-10-CM | POA: Diagnosis not present

## 2023-08-12 NOTE — Assessment & Plan Note (Signed)
 Ongoing, has friend that is also a caregiver. Continues supportive care.

## 2023-08-12 NOTE — Assessment & Plan Note (Signed)
 Continues on synthroid  Follow tsh yearly

## 2023-08-12 NOTE — Assessment & Plan Note (Signed)
 Stable, will occasionally have pain after certain activity but otherwise not bothersome at this time.

## 2023-08-12 NOTE — Progress Notes (Signed)
 Careteam: Patient Care Team: Verma Gobble, NP as PCP - General (Geriatric Medicine) Sonny Dust, MD (Inactive) as PCP - Cardiology (Cardiology) Arnie Lao, MD as Consulting Physician (Orthopedic Surgery) Magrinat, Rozella Cornfield, MD (Inactive) as Consulting Physician (Oncology)  PLACE OF SERVICE:  [facility]  Advanced Directive information Does Patient Have a Medical Advance Directive?: Yes, Type of Advance Directive: Out of facility DNR (pink MOST or yellow form), Does patient want to make changes to medical advance directive?: No - Patient declined  Allergies  Allergen Reactions   Amoxicillin  Rash   Diphenhydramine  Rash    It is the red dye in Benadryl  that I''m allergic to   Naproxen Sodium Rash    It is the blue dye in naproxen that I'm allergic to.   Lipitor [Atorvastatin]     Unknown reaction   Meclizine  Other (See Comments)    Worsening dizziness and blurred vision   Amlodipine  Other (See Comments)    Dizzy   Cefdinir Other (See Comments)    Vertigo and headaches    Codeine  Other (See Comments)    unknown   Ferrous Sulfate     tears belly up   Methocarbamol  Other (See Comments)    insomnia    Prednisone Other (See Comments)    vertigo and headaches   Skelaxin [Metaxalone] Other (See Comments)    Gastric upset.    Tramadol  Nausea Only   Hctz [Hydrochlorothiazide] Itching    Chief Complaint  Patient presents with   Medical Management of Chronic Issues    Medical Management of Chronic Issues. Here with Adell Hones (friend) and Dog. 6 Month follow up. To discuss need for Zoster, Covid Tdap and AWV    HPI:  Discussed the use of AI scribe software for clinical note transcription with the patient, who gave verbal consent to proceed.  History of Present Illness Alice [name] is a 89 year old female with coronary artery disease who presents for a six-month follow-up.  She experiences increased episodes of vertigo, which have been  ongoing. Typical treatment involves addressing ear wax buildup, which usually resolves the problem. No recent falls are reported.  She has concerns about her short-term memory, although it is not particularly bothersome.  The only medication she takes is her levothyroxine  medication.  She underwent a coronary artery bypass graft over a year ago following an episode of chest pain. Currently, she experiences no chest pain and has not been following up with a cardiologist. She is not taking any additional medications for her heart condition and prefers to avoid unnecessary medications.  There are no significant changes in her eating or drinking habits. No nasal congestion or runny nose.  She lives alone most of the time but is frequently visited by a friend named Orick. She has three sons who live in different states. She reports being mostly sedentary and does not engage in much physical activity.   Review of Systems:  Review of Systems  Constitutional:  Negative for chills, fever and weight loss.  HENT:  Negative for tinnitus.   Respiratory:  Negative for cough, sputum production and shortness of breath.   Cardiovascular:  Negative for chest pain, palpitations and leg swelling.  Gastrointestinal:  Negative for abdominal pain, constipation, diarrhea and heartburn.  Genitourinary:  Negative for dysuria, frequency and urgency.  Musculoskeletal:  Negative for back pain, falls, joint pain and myalgias.  Skin: Negative.   Neurological:  Positive for dizziness. Negative for headaches.  Psychiatric/Behavioral:  Negative for depression and memory loss. The patient does not have insomnia.     Past Medical History:  Diagnosis Date   Alopecia    Breast cancer (HCC)     Right , chemo, radiation   DDD (degenerative disc disease)    Heart palpitations    History of diverticulitis    History of diverticulitis of colon    Hypercholesteremia    Hypothyroidism    Insect bites    Internal and  external bleeding hemorrhoids 08/11/2015   Personal history of chemotherapy 2012   Personal history of radiation therapy 2012   Scoliosis    L4 L5   Seasonal allergies    SUI (stress urinary incontinence, female)    Past Surgical History:  Procedure Laterality Date   BLADDER SUSPENSION     bowel rescetion     BOWEL RESECTION     BREAST BIOPSY Right 03/14/2010   BREAST LUMPECTOMY  2012   right   CATARACT EXTRACTION     left   COLON SURGERY  20 YRS AGO   BOWEL RESECTION   CORONARY ARTERY BYPASS GRAFT N/A 03/19/2022   Procedure: CORONARY ARTERY BYPASS GRAFTING (CABG) X TWO, USING LEFT INTERNAL MAMMARY ARTERY AND RIGHT LEG GREATER SAPHENOUS VEIN HARVESTED ENDOSCOPICALLY;  Surgeon: Zelphia Higashi, MD;  Location: [facility] OR;  Service: Open Heart Surgery;  Laterality: N/A;   EYE SURGERY     HEMORRHOID BANDING     HERNIA REPAIR     RIH, Dr. Toniann Franklin   IABP INSERTION N/A 03/19/2022   Procedure: IABP Insertion;  Surgeon: Avanell Leigh, MD;  Location: [facility] INVASIVE CV LAB;  Service: Cardiovascular;  Laterality: N/A;   IR THORACENTESIS ASP PLEURAL SPACE W/IMG GUIDE  03/26/2022   JOINT REPLACEMENT     LEFT HEART CATH AND CORONARY ANGIOGRAPHY N/A 03/19/2022   Procedure: LEFT HEART CATH AND CORONARY ANGIOGRAPHY;  Surgeon: Avanell Leigh, MD;  Location: [facility] INVASIVE CV LAB;  Service: Cardiovascular;  Laterality: N/A;   PORT-A-CATH REMOVAL     TEE WITHOUT CARDIOVERSION N/A 03/19/2022   Procedure: TRANSESOPHAGEAL ECHOCARDIOGRAM;  Surgeon: Zelphia Higashi, MD;  Location: [facility] OR;  Service: Open Heart Surgery;  Laterality: N/A;   TONSILLECTOMY  2001   Diverticulitis   TOTAL HIP ARTHROPLASTY Right 09/10/2014   Procedure: RIGHT TOTAL HIP ARTHROPLASTY ANTERIOR APPROACH;  Surgeon: Arnie Lao, MD;  Location: [facility] ORS;  Service: Orthopedics;  Laterality: Right;   Social History:   reports that she quit smoking about 59 years ago. Her smoking use included cigarettes. She has never used  smokeless tobacco. She reports current alcohol use. She reports that she does not use drugs.  Family History  Problem Relation Age of Onset   Heart disease Mother    Heart disease Father    CVA Father 35   Cancer Sister 50       lymphoma   Asthma Son    Asthma Son    Asthma Son    Skin cancer Brother 75       melanoma   Colon cancer Other     Medications: Patient's Medications  New Prescriptions   No medications on file  Previous Medications   LEVOTHYROXINE  (SYNTHROID ) 100 MCG TABLET    Take 1 tablet (100 mcg total) by mouth daily.  Modified Medications   No medications on file  Discontinued Medications   No medications on file    Physical Exam:  Vitals:   08/12/23 1130  BP: 128/74  Pulse:  80  Temp: (!) 97.4 F (36.3 C)  SpO2: 94%  Weight: 128 lb 3.2 oz (58.2 kg)  Height: 5' 2 (1.575 m)   Body mass index is 23.45 kg/m. Wt Readings from Last 3 Encounters:  08/12/23 128 lb 3.2 oz (58.2 kg)  02/05/23 128 lb 6.4 oz (58.2 kg)  07/02/22 120 lb 9.6 oz (54.7 kg)    Physical Exam Constitutional:      General: She is not in acute distress.    Appearance: She is well-developed. She is not diaphoretic.  HENT:     Head: Normocephalic and atraumatic.     Right Ear: Tympanic membrane, ear canal and external ear normal.     Left Ear: Tympanic membrane, ear canal and external ear normal.     Mouth/Throat:     Pharynx: No oropharyngeal exudate.   Eyes:     Conjunctiva/sclera: Conjunctivae normal.     Pupils: Pupils are equal, round, and reactive to light.    Cardiovascular:     Rate and Rhythm: Normal rate and regular rhythm.     Heart sounds: Normal heart sounds.  Pulmonary:     Effort: Pulmonary effort is normal.     Breath sounds: Normal breath sounds.  Abdominal:     General: Bowel sounds are normal.     Palpations: Abdomen is soft.   Musculoskeletal:     Cervical back: Normal range of motion and neck supple.     Right lower leg: No edema.     Left  lower leg: No edema.   Skin:    General: Skin is warm and dry.   Neurological:     Mental Status: She is alert.   Psychiatric:        Mood and Affect: Mood normal.     Labs reviewed: Basic Metabolic Panel: Recent Labs    01/14/23 1058  NA 141  K 4.6  CL 102  CO2 32  GLUCOSE 95  BUN 25  CREATININE 0.88  CALCIUM  9.9  TSH 1.53   Liver Function Tests: Recent Labs    01/14/23 1058  AST 14  ALT 8  BILITOT 0.5  PROT 7.0   No results for input(s): LIPASE, AMYLASE in the last 8760 hours. No results for input(s): AMMONIA in the last 8760 hours. CBC: Recent Labs    01/14/23 1058  WBC 8.9  NEUTROABS 4,174  HGB 13.1  HCT 41.0  MCV 85.6  PLT 250   Lipid Panel: No results for input(s): CHOL, HDL, LDLCALC, TRIG, CHOLHDL, LDLDIRECT in the last 8760 hours. TSH: Recent Labs    01/14/23 1058  TSH 1.53   A1C: Lab Results  Component Value Date   HGBA1C 5.9 (H) 01/14/2023     Assessment/Plan  Coronary artery disease involving native coronary artery of native heart with other form of angina pectoris [facility]) Assessment & Plan: S/p CABG, she did not wish to take any medication recommended by cardiology. She is not have any symptoms of chest pains, shortness of breath or LE edema.   Acquired hypothyroidism Assessment & Plan: Continues on synthroid  Follow tsh yearly   Memory loss Assessment & Plan: Ongoing, has friend that is also a caregiver. Continues supportive care.    Chronic bilateral low back pain without sciatica Assessment & Plan: Stable, will occasionally have pain after certain activity but otherwise not bothersome at this time.       Return in about 6 months (around 02/11/2024) for routine follow up.  Cece Milhouse K. Denney Fisherman  [facility] [phone]

## 2023-08-12 NOTE — Patient Instructions (Addendum)
 Please schedule AWV- can be virtual q1

## 2023-08-12 NOTE — Assessment & Plan Note (Signed)
 S/p CABG, she did not wish to take any medication recommended by cardiology. She is not have any symptoms of chest pains, shortness of breath or LE edema.

## 2023-08-19 ENCOUNTER — Encounter: Admitting: Adult Health

## 2023-08-19 NOTE — Progress Notes (Signed)
 This encounter was created in error - please disregard.

## 2023-09-17 ENCOUNTER — Ambulatory Visit: Payer: Self-pay

## 2023-09-17 NOTE — Telephone Encounter (Signed)
 FYI Only or Action Required?: FYI only for provider.  Patient was last seen in primary care on 08/12/2023 by Caro Harlene POUR, NP.  Called Nurse Triage reporting Hip Pain.  Symptoms began it's always there.  Symptoms are: unchanged.  Triage Disposition: See PCP Within 2 Weeks  Patient/caregiver understands and will follow disposition?: Yes    Copied from CRM #[id number]. Topic: Clinical - Red Word Triage >> Sep 17, 2023  4:12 PM Alfonso ORN wrote: Red Word that prompted transfer to Nurse Triage: son Krystal Freer would like to talk with the nurse , patient is on the phone as well , recurring hip pain , rating 10  call back # [phone]    Reason for Disposition  Hip pain is a chronic symptom (recurrent or ongoing AND present > 4 weeks)  Answer Assessment - Initial Assessment Questions Patient only wanted to be scheduled with her PCP for her chronic hip pain. Earliest available appointment scheduled and added to wait list.     1. LOCATION and RADIATION: Where is the pain located? Does the pain spread (shoot) anywhere else?     Right hip 3. SEVERITY: How bad is the pain? What does it keep you from doing?   (Scale 1-10; or mild, moderate, severe)     Mild to moderate  4. ONSET: When did the pain start? Does it come and go, or is it there all the time?     A few days  5. WORK OR EXERCISE: Has there been any recent work or exercise that involved this part of the body?      No 6. CAUSE: What do you think is causing the hip pain?      Unsure  7. AGGRAVATING FACTORS: What makes the hip pain worse? (e.g., walking, climbing stairs, running)     Walking  8. OTHER SYMPTOMS: Do you have any other symptoms? (e.g., back pain, pain shooting down leg,  fever, rash)     No  Protocols used: Hip Pain-A-AH

## 2023-09-18 ENCOUNTER — Encounter: Payer: Self-pay | Admitting: Adult Health

## 2023-09-30 ENCOUNTER — Encounter: Admitting: Adult Health

## 2023-10-14 ENCOUNTER — Ambulatory Visit (INDEPENDENT_AMBULATORY_CARE_PROVIDER_SITE_OTHER): Admitting: Nurse Practitioner

## 2023-10-14 ENCOUNTER — Encounter: Payer: Self-pay | Admitting: Nurse Practitioner

## 2023-10-14 VITALS — BP 122/68 | HR 76 | Temp 97.3°F | Resp 18 | Ht 62.0 in

## 2023-10-14 DIAGNOSIS — M7062 Trochanteric bursitis, left hip: Secondary | ICD-10-CM | POA: Diagnosis not present

## 2023-10-14 NOTE — Patient Instructions (Signed)
 To use aleve 1 tablet twice daily with food for 5 days for inflammation- only use for short term due to side effects  To ice left hip 3 times daily

## 2023-10-14 NOTE — Progress Notes (Signed)
 Careteam: Patient Care Team: Caro Harlene POUR, NP as PCP - General (Geriatric Medicine) Hobart Powell BRAVO, MD (Inactive) as PCP - Cardiology (Cardiology) Vernetta Lonni GRADE, MD as Consulting Physician (Orthopedic Surgery)  PLACE OF SERVICE:  [facility]IN[facility]  Advanced Directive information    Allergies  Allergen Reactions   Amoxicillin  Rash   Diphenhydramine  Rash    It is the red dye in Benadryl  that I''m allergic to   Naproxen Sodium Rash    It is the blue dye in naproxen that I'm allergic to.   Lipitor [Atorvastatin]     Unknown reaction   Meclizine  Other (See Comments)    Worsening dizziness and blurred vision   Amlodipine  Other (See Comments)    Dizzy   Cefdinir Other (See Comments)    Vertigo and headaches    Codeine  Other (See Comments)    unknown   Ferrous Sulfate     tears belly up   Methocarbamol  Other (See Comments)    insomnia    Prednisone Other (See Comments)    vertigo and headaches   Skelaxin [Metaxalone] Other (See Comments)    Gastric upset.    Tramadol  Nausea Only   Hctz [Hydrochlorothiazide] Itching    Chief Complaint  Patient presents with   Hip Pain    Right hip pain.     HPI:  Discussed the use of AI scribe software for clinical note transcription with the patient, who gave verbal consent to proceed.  History of Present Illness Alice [name] is a 89 year old female who presents with left hip pain.  She experiences pain in her left hip, specifically located in the joint, which has gradually worsened over time. The pain is consistently present, particularly when walking, causing noticeable discomfort. There is no pain while sitting or lying down.  She has not sustained any recent injuries to the hip and attributes the pain to aging. She has not previously sought medical evaluation or treatment for this issue. She occasionally takes aspirin  for the pain, increasing the frequency to two or three times in succession if the pain  becomes severe.  During the review of symptoms, the pain does not radiate and is localized to the left hip. She experiences occasional buckling of the hip. No pain is reported when sitting or lying down.    Review of Systems:  Review of Systems  Constitutional:  Negative for chills, fever and weight loss.  Musculoskeletal:  Positive for joint pain and myalgias. Negative for back pain, falls and neck pain.  Psychiatric/Behavioral:  Positive for memory loss.     Past Medical History:  Diagnosis Date   Alopecia    Breast cancer (HCC)     Right , chemo, radiation   DDD (degenerative disc disease)    Heart palpitations    History of diverticulitis    History of diverticulitis of colon    Hypercholesteremia    Hypothyroidism    Insect bites    Internal and external bleeding hemorrhoids 08/11/2015   Personal history of chemotherapy 2012   Personal history of radiation therapy 2012   Scoliosis    L4 L5   Seasonal allergies    SUI (stress urinary incontinence, female)    Past Surgical History:  Procedure Laterality Date   BLADDER SUSPENSION     bowel rescetion     BOWEL RESECTION     BREAST BIOPSY Right 03/14/2010   BREAST LUMPECTOMY  2012   right   CATARACT EXTRACTION  left   COLON SURGERY  20 YRS AGO   BOWEL RESECTION   CORONARY ARTERY BYPASS GRAFT N/A 03/19/2022   Procedure: CORONARY ARTERY BYPASS GRAFTING (CABG) X TWO, USING LEFT INTERNAL MAMMARY ARTERY AND RIGHT LEG GREATER SAPHENOUS VEIN HARVESTED ENDOSCOPICALLY;  Surgeon: Kerrin Elspeth BROCKS, MD;  Location: [facility] OR;  Service: Open Heart Surgery;  Laterality: N/A;   EYE SURGERY     HEMORRHOID BANDING     HERNIA REPAIR     RIH, Dr. Lorriane   IABP INSERTION N/A 03/19/2022   Procedure: IABP Insertion;  Surgeon: Court Dorn PARAS, MD;  Location: [facility] INVASIVE CV LAB;  Service: Cardiovascular;  Laterality: N/A;   IR THORACENTESIS ASP PLEURAL SPACE W/IMG GUIDE  03/26/2022   JOINT REPLACEMENT     LEFT HEART CATH AND  CORONARY ANGIOGRAPHY N/A 03/19/2022   Procedure: LEFT HEART CATH AND CORONARY ANGIOGRAPHY;  Surgeon: Court Dorn PARAS, MD;  Location: [facility] INVASIVE CV LAB;  Service: Cardiovascular;  Laterality: N/A;   PORT-A-CATH REMOVAL     TEE WITHOUT CARDIOVERSION N/A 03/19/2022   Procedure: TRANSESOPHAGEAL ECHOCARDIOGRAM;  Surgeon: Kerrin Elspeth BROCKS, MD;  Location: [facility] OR;  Service: Open Heart Surgery;  Laterality: N/A;   TONSILLECTOMY  2001   Diverticulitis   TOTAL HIP ARTHROPLASTY Right 09/10/2014   Procedure: RIGHT TOTAL HIP ARTHROPLASTY ANTERIOR APPROACH;  Surgeon: Lonni CINDERELLA Poli, MD;  Location: [facility] ORS;  Service: Orthopedics;  Laterality: Right;   Social History:   reports that she quit smoking about 59 years ago. Her smoking use included cigarettes. She has never used smokeless tobacco. She reports current alcohol use. She reports that she does not use drugs.  Family History  Problem Relation Age of Onset   Heart disease Mother    Heart disease Father    CVA Father 70   Cancer Sister 69       lymphoma   Asthma Son    Asthma Son    Asthma Son    Skin cancer Brother 42       melanoma   Colon cancer Other     Medications: Patient's Medications  New Prescriptions   No medications on file  Previous Medications   LEVOTHYROXINE  (SYNTHROID ) 100 MCG TABLET    Take 1 tablet (100 mcg total) by mouth daily.  Modified Medications   No medications on file  Discontinued Medications   No medications on file    Physical Exam:  Vitals:   10/14/23 1316  BP: 122/68  Pulse: 76  Resp: 18  Temp: (!) 97.3 F (36.3 C)  SpO2: 94%  Height: 5' 2 (1.575 m)   Body mass index is 23.45 kg/m. Wt Readings from Last 3 Encounters:  08/12/23 128 lb 3.2 oz (58.2 kg)  02/05/23 128 lb 6.4 oz (58.2 kg)  07/02/22 120 lb 9.6 oz (54.7 kg)    Physical Exam Constitutional:      Appearance: Normal appearance.  Pulmonary:     Effort: Pulmonary effort is normal.  Musculoskeletal:     Right hip:  Normal range of motion.     Left hip: Tenderness (noted over bursa) present. Normal range of motion.       Legs:  Neurological:     Mental Status: She is alert. Mental status is at baseline.  Psychiatric:        Mood and Affect: Mood normal.     Labs reviewed: Basic Metabolic Panel: Recent Labs    01/14/23 1058  NA 141  K 4.6  CL 102  CO2 32  GLUCOSE 95  BUN 25  CREATININE 0.88  CALCIUM  9.9  TSH 1.53   Liver Function Tests: Recent Labs    01/14/23 1058  AST 14  ALT 8  BILITOT 0.5  PROT 7.0   No results for input(s): LIPASE, AMYLASE in the last 8760 hours. No results for input(s): AMMONIA in the last 8760 hours. CBC: Recent Labs    01/14/23 1058  WBC 8.9  NEUTROABS 4,174  HGB 13.1  HCT 41.0  MCV 85.6  PLT 250   Lipid Panel: No results for input(s): CHOL, HDL, LDLCALC, TRIG, CHOLHDL, LDLDIRECT in the last 8760 hours. TSH: Recent Labs    01/14/23 1058  TSH 1.53   A1C: Lab Results  Component Value Date   HGBA1C 5.9 (H) 01/14/2023     Assessment/Plan Assessment and Plan Assessment & Plan Left hip trochanteric bursitis Chronic left hip pain consistent with bursitis, exacerbated by walking, relieved by rest. Oral prednisone contraindicated due to intolerance. Discussed physical therapy and orthopedic evaluation - Recommend physical therapy for gait improvement and pain management. - Refer to orthopedic specialist for potential bursa injection. - Recommend ice packs or gel cold packs on affected area. - Prescribe Aleve (naproxen) for 5 days with caution for GI, heart, and kidney side effects. Take with food - Recommend Tylenol  325 mg by mouth every 6 hours as needed for pain management instead of aspirin .   Kahlen Boyde K. Caro BODILY [facility] [phone]

## 2023-10-16 ENCOUNTER — Encounter: Payer: Self-pay | Admitting: Nurse Practitioner

## 2023-11-09 ENCOUNTER — Encounter: Payer: Self-pay | Admitting: Nurse Practitioner

## 2023-11-11 ENCOUNTER — Encounter: Payer: Self-pay | Admitting: Physician Assistant

## 2023-11-11 ENCOUNTER — Other Ambulatory Visit (INDEPENDENT_AMBULATORY_CARE_PROVIDER_SITE_OTHER): Payer: Self-pay

## 2023-11-11 ENCOUNTER — Ambulatory Visit (INDEPENDENT_AMBULATORY_CARE_PROVIDER_SITE_OTHER): Admitting: Physician Assistant

## 2023-11-11 DIAGNOSIS — Z96641 Presence of right artificial hip joint: Secondary | ICD-10-CM | POA: Diagnosis not present

## 2023-11-11 DIAGNOSIS — M25551 Pain in right hip: Secondary | ICD-10-CM

## 2023-11-11 MED ORDER — LEVOTHYROXINE SODIUM 100 MCG PO TABS: 100.0000 ug | ORAL_TABLET | Freq: Every day | ORAL | 1 refills | Status: DC

## 2023-11-11 NOTE — Progress Notes (Signed)
 HPI: Alice [name] comes in today for bilateral hip pain.  She further states that she is unsure why she is here.  But then FaceTime and her son she states that she has had bilateral hip pain.  History of right total hip arthroplasty 09/10/2014 by Dr. Vernetta.  He has been complaining of hip pain over the past 2 to 3 months no known injury.  Pain with ambulation only.  Points to the lateral aspect of bilateral hips no numbness tingling.  No groin pain.  Review of systems: Negative fevers chills.  Physical exam: General Well-developed well-nourished female ambulates without any assistive device nonantalgic gait. Bilateral hips: Excellent range of motion of both hips.  No pain with internal or external rotation of either hip.  Tenderness to the trochanteric region both hips.  Radiographs: AP pelvis and lateral view of the right hip: Status post right total hip arthroplasty with well-seated components.  No acute fracture.  Both hips well located.  No bony abnormalities otherwise.  Impression: Bilateral hip trochanteric bursitis  Plan: Offered her cortisone injection she defers.  She does have physical therapy coming up for hip bursitis tomorrow.  She was shown IT band stretches today and I had her demonstrate these back to myself.  Questions were encouraged and answered patient and her son via FaceTime.  If her pain persist despite physical therapy recommend cortisone injection in future.

## 2023-11-11 NOTE — Therapy (Incomplete)
 OUTPATIENT PHYSICAL THERAPY EVALUATION   Patient Name: Alice [name] MRN: [id number] DOB:07-31-1930, 89 y.o., female Today's Date: 11/12/2023  END OF SESSION:  PT End of Session - 11/12/23 1007     Visit Number 1    Number of Visits 20    Date for PT Re-Evaluation 01/21/24    Authorization Type [employer]    Progress Note Due on Visit 10    PT Start Time 1015    PT Stop Time 1049    PT Time Calculation (min) 34 min    Activity Tolerance Patient tolerated treatment well    Behavior During Therapy WFL for tasks assessed/performed          Past Medical History:  Diagnosis Date   Alopecia    Breast cancer (HCC)     Right , chemo, radiation   DDD (degenerative disc disease)    Heart palpitations    History of diverticulitis    History of diverticulitis of colon    Hypercholesteremia    Hypothyroidism    Insect bites    Internal and external bleeding hemorrhoids 08/11/2015   Personal history of chemotherapy 2012   Personal history of radiation therapy 2012   Scoliosis    L4 L5   Seasonal allergies    SUI (stress urinary incontinence, female)    Past Surgical History:  Procedure Laterality Date   BLADDER SUSPENSION     bowel rescetion     BOWEL RESECTION     BREAST BIOPSY Right 03/14/2010   BREAST LUMPECTOMY  2012   right   CATARACT EXTRACTION     left   COLON SURGERY  20 YRS AGO   BOWEL RESECTION   CORONARY ARTERY BYPASS GRAFT N/A 03/19/2022   Procedure: CORONARY ARTERY BYPASS GRAFTING (CABG) X TWO, USING LEFT INTERNAL MAMMARY ARTERY AND RIGHT LEG GREATER SAPHENOUS VEIN HARVESTED ENDOSCOPICALLY;  Surgeon: Kerrin Elspeth BROCKS, MD;  Location: [facility] OR;  Service: Open Heart Surgery;  Laterality: N/A;   EYE SURGERY     HEMORRHOID BANDING     HERNIA REPAIR     RIH, Dr. Lorriane   IABP INSERTION N/A 03/19/2022   Procedure: IABP Insertion;  Surgeon: Court Dorn PARAS, MD;  Location: [facility] INVASIVE CV LAB;  Service: Cardiovascular;  Laterality: N/A;   IR  THORACENTESIS ASP PLEURAL SPACE W/IMG GUIDE  03/26/2022   JOINT REPLACEMENT     LEFT HEART CATH AND CORONARY ANGIOGRAPHY N/A 03/19/2022   Procedure: LEFT HEART CATH AND CORONARY ANGIOGRAPHY;  Surgeon: Court Dorn PARAS, MD;  Location: [facility] INVASIVE CV LAB;  Service: Cardiovascular;  Laterality: N/A;   PORT-A-CATH REMOVAL     TEE WITHOUT CARDIOVERSION N/A 03/19/2022   Procedure: TRANSESOPHAGEAL ECHOCARDIOGRAM;  Surgeon: Kerrin Elspeth BROCKS, MD;  Location: [facility] OR;  Service: Open Heart Surgery;  Laterality: N/A;   TONSILLECTOMY  2001   Diverticulitis   TOTAL HIP ARTHROPLASTY Right 09/10/2014   Procedure: RIGHT TOTAL HIP ARTHROPLASTY ANTERIOR APPROACH;  Surgeon: Lonni CINDERELLA Poli, MD;  Location: [facility] ORS;  Service: Orthopedics;  Laterality: Right;   Patient Active Problem List   Diagnosis Date Noted   Acquired hypothyroidism 08/12/2023   Memory loss 08/12/2023   Chronic bilateral low back pain without sciatica 08/12/2023   CAD (coronary artery disease) 04/17/2022   NSTEMI (non-ST elevated myocardial infarction) (HCC) 03/19/2022   Depression, recurrent (HCC) 02/01/2020   Atherosclerosis of aorta (HCC) 09/15/2018   Iron deficiency anemia due to chronic blood loss 08/12/2016   Mild anemia 07/06/2016  Left torticollis 08/11/2015   Status post total replacement of right hip 09/10/2014   Arthralgia 12/08/2012    PCP: Caro Harlene POUR, NP  REFERRING PROVIDER: Caro Harlene POUR, NP  REFERRING DIAG: M70.62 (ICD-10-CM) - Trochanteric bursitis of left hip  THERAPY DIAG:  Pain in left hip  Pain in right hip  Other low back pain  Muscle weakness (generalized)  Difficulty in walking, not elsewhere classified  Rationale for Evaluation and Treatment: Rehabilitation  ONSET DATE: Pt was unsure (insidous onset).   SUBJECTIVE:   SUBJECTIVE STATEMENT: Pt indicated having complaints of Lt hip pain that seems to hurt with garden/housework.  Reported mild /moderate pain symptoms.  Pt  indicated getting to sleep sometimes limited but able to sleep once asleep.   Reported living alone.   Pt was accompanied by friend and son was present by phone call during eval.   Son indicated patient has memory difficulties.   PERTINENT HISTORY: History of breast cancer, DDD, hypercholesteremia, hypothyroidism, scoliosis right total hip arthroplasty 09/10/2014   PAIN:  NPRS scale: reported mild to moderate / /10 Pain location: Lt hip, Rt hip  Pain description: No specific description given.  Aggravating factors: garden,  Relieving factors: OTC medicine at times  PRECAUTIONS: None  WEIGHT BEARING RESTRICTIONS: No  FALLS:  Has patient fallen in last 6 months? No  LIVING ENVIRONMENT: Lives with: lives alone Lives in: House/apartment Stairs: 1 small step on patio.    OCCUPATION: Retire  PLOF: Independent, garden, household activity  PATIENT GOALS: Unsure, was just told to come to therapy   OBJECTIVE:   PATIENT SURVEYS:  Patient-Specific Activity Scoring Scheme  0 represents "unable to perform." 10 represents "able to perform at prior level. 0 1 2 3 4 5 6 7 8 9  10 (Date and Score)   Activity Eval  11/12/2023    1. Daily activity 8.5     2.       3.     4.    5.    Score 8.5    Total score = sum of the activity scores/number of activities Minimum detectable change (90%CI) for average score = 2 points Minimum detectable change (90%CI) for single activity score = 3 points  COGNITION: 11/12/2023 Overall cognitive status: WFL    SENSATION: 11/12/2023 Pt denied numbness/tingling.   EDEMA:  11/12/2023 No specific testing today  MUSCLE LENGTH: 11/12/2023 No specific testing.   POSTURE:  11/12/2023 Reduced lumbar lordosis, increased thoracic kyphosis.   PALPATION: 11/12/2023 Mild tenderness lumbar, lateral hips.   LUMBAR ROM:   ROM AROM  Eval 11/12/2023  Flexion To toes without complaints.   Extension 25% with tightness in lumbar noted   Right lateral flexion   Left lateral flexion   Right rotation   Left rotation    (Blank rows = not tested)    LOWER EXTREMITY ROM:   ROM Right Eval 11/12/2023 Left Eval 11/12/2023  Hip flexion    Hip extension    Hip abduction    Hip adduction    Hip internal rotation    Hip external rotation    Knee flexion    Knee extension    Ankle dorsiflexion    Ankle plantarflexion    Ankle inversion    Ankle eversion     (Blank rows = not tested)  LOWER EXTREMITY MMT:  MMT Right Eval 11/12/2023 Left Eval 11/12/2023  Hip flexion 4/5 4/5  Hip extension    Hip abduction 4/5 testing in sitting 4/5 tested in  sitting  Hip adduction 5/5 testing in sitting.  5/5 testing in sitting  Hip internal rotation    Hip external rotation    Knee flexion 5/5 5/5  Knee extension 5/5 5/5  Ankle dorsiflexion 5/5 5/5  Ankle plantarflexion    Ankle inversion    Ankle eversion     (Blank rows = not tested)  LOWER EXTREMITY SPECIAL TESTS:  11/12/2023 No specific testing.   FUNCTIONAL TESTS:  11/12/2023 18 inch chair transfer: able to perform on 1st attempt without UE but poor control in lowering last 25 % of movement Lt SLS: < 3 seconds Rt SLS: < 3 seconds  GAIT: 11/12/2023 Independent ambulation.  Forward trunk lean with increased thoracic kyphosis noted.  Shortened step lengths with wider base of support noted.                                                                                                                                                                         TODAY'S TREATMENT                                                                          DATE: 11/12/2023 Therex:    HEP instruction/performance c cues for techniques, handout provided.  Trial set performed of each for comprehension and symptom assessment.  See below for exercise list  Self Care Advice given on positional changes during day to promote mobility.  Instruction for adapting activity as able to avoid  excessive pain increases.  Discussed encouragement for walking for exercise.  Discussed safety for setup on HEP (no rolling chairs for example)  PATIENT EDUCATION:  11/12/2023 Education details: HEP, POC Person educated: Patient, son on phone Education method: Explanation, Demonstration, Verbal cues, and Handouts Education comprehension: verbalized understanding, returned demonstration, and verbal cues required  HOME EXERCISE PROGRAM: Access Code: [email or web address] URL: [email or web address] Date: 11/12/2023 Prepared by: Ozell Silvan  Exercises - Supine Bridge  - 1-2 x daily - 7 x weekly - 1-2 sets - 10 reps - 2 hold - Clamshell with Resistance  - 1-2 x daily - 7 x weekly - 3 sets - 10 reps - Sit to Stand  - 1-2 x daily - 7 x weekly - 1 sets - 10 reps  ASSESSMENT:  CLINICAL IMPRESSION: Patient is a 89 y.o. who comes to clinic with complaints of back/bilateral hip pain with mobility, strength and movement coordination deficits that impair their ability to perform usual daily and recreational functional  activities without increase difficulty/symptoms at this time.  Patient to benefit from skilled PT services to address impairments and limitations to improve to previous level of function without restriction secondary to condition.   OBJECTIVE IMPAIRMENTS: Abnormal gait, decreased activity tolerance, decreased balance, decreased coordination, decreased endurance, decreased mobility, difficulty walking, decreased ROM, decreased strength, hypomobility, increased fascial restrictions, impaired perceived functional ability, impaired flexibility, improper body mechanics, postural dysfunction, and pain.   ACTIVITY LIMITATIONS: bending and locomotion level  PARTICIPATION LIMITATIONS: interpersonal relationship, community activity, and yard work  PERSONAL FACTORS: History of breast cancer, DDD, hypercholesteremia, hypothyroidism, scoliosis are also affecting patient's functional outcome.    REHAB POTENTIAL: Good  CLINICAL DECISION MAKING: Stable/uncomplicated  EVALUATION COMPLEXITY: Low   GOALS: Goals reviewed with patient? Yes  SHORT TERM GOALS: (target date for Short term goals are 3 weeks 12/03/2023)   1.  Patient will demonstrate independent use of home exercise program to maintain progress from in clinic treatments.  Goal status: New  LONG TERM GOALS: (target dates for all long term goals are 6 weeks  12/24/2023 )   1. Patient will demonstrate/report pain at worst less than or equal to 2/10 to facilitate minimal limitation in daily activity secondary to pain symptoms.  Goal status: New   2. Patient will demonstrate independent use of home exercise program to facilitate ability to maintain/progress functional gains from skilled physical therapy services.  Goal status: New   3. Patient will demonstrate Patient specific functional scale avg > or = 9/10 to indicate reduced disability due to condition.   Goal status: New   4.  Patient will demonstrate Lt LE MMT 5/5 throughout to faciltiate usual transfers, stairs, squatting at [facility] for daily life.   Goal status: New    PLAN:  PT FREQUENCY: up to 1x /week (starting with HEP period)  PT DURATION: 6 weeks  PLANNED INTERVENTIONS: Can include 02853- PT Re-evaluation, 97110-Therapeutic exercises, 97530- Therapeutic activity, [id number]- Neuromuscular re-education, 97535- Self Care, 97140- Manual therapy, [phone]- Gait training, [phone]- Orthotic Fit/training, [phone]- Canalith repositioning, [id number]- Aquatic Therapy, [phone]- Electrical stimulation (unattended), [id number] Physical performance testing, 97016- Vasopneumatic device, [id number]- Ultrasound, [id number]- Traction (mechanical), [id number]- Ionotophoresis 4mg /ml Dexamethasone,  20560 - Needle insertion w/o injection 1 or 2 muscles, 20561 - Needle insertion w/o injection 3 or more muscles.   Patient/Family education, Balance training, Stair training, Taping, Dry Needling, Joint  mobilization, Joint manipulation, Spinal manipulation, Spinal mobilization, Scar mobilization, Vestibular training, Visual/preceptual remediation/compensation, DME instructions, Cryotherapy, and Moist heat.  All performed as medically necessary.  All included unless contraindicated  PLAN FOR NEXT SESSION: Going to trial HEP and may return for reassessment as necessary.    Ozell Silvan, PT, DPT, OCS, ATC 11/12/23  10:56 AM

## 2023-11-12 ENCOUNTER — Encounter: Payer: Self-pay | Admitting: Rehabilitative and Restorative Service Providers"

## 2023-11-12 ENCOUNTER — Ambulatory Visit (INDEPENDENT_AMBULATORY_CARE_PROVIDER_SITE_OTHER): Admitting: Rehabilitative and Restorative Service Providers"

## 2023-11-12 DIAGNOSIS — M25551 Pain in right hip: Secondary | ICD-10-CM | POA: Diagnosis not present

## 2023-11-12 DIAGNOSIS — M25552 Pain in left hip: Secondary | ICD-10-CM | POA: Diagnosis not present

## 2023-11-12 DIAGNOSIS — M5459 Other low back pain: Secondary | ICD-10-CM

## 2023-11-12 DIAGNOSIS — M6281 Muscle weakness (generalized): Secondary | ICD-10-CM

## 2023-11-12 DIAGNOSIS — R262 Difficulty in walking, not elsewhere classified: Secondary | ICD-10-CM | POA: Diagnosis not present

## 2023-11-15 ENCOUNTER — Encounter: Admitting: Nurse Practitioner

## 2023-12-30 ENCOUNTER — Encounter: Payer: Self-pay | Admitting: Radiology

## 2024-02-05 ENCOUNTER — Encounter: Payer: Self-pay | Admitting: Nurse Practitioner

## 2024-02-10 ENCOUNTER — Ambulatory Visit: Payer: Self-pay

## 2024-02-10 ENCOUNTER — Ambulatory Visit: Payer: Self-pay | Admitting: Nurse Practitioner
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
## 2024-02-10 NOTE — Telephone Encounter (Signed)
 Okay to wait- would advise NO cooking or using stove or microwave
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
## 2024-02-10 NOTE — Telephone Encounter (Signed)
 She is scheduled to see Amy on the 19, however message sent to PCP to review and advise if ok to wait until the 19th

## 2024-02-10 NOTE — Telephone Encounter (Signed)
 Pt had appt today but was unable to keep due to transportation issue- recommend to reschedule.

## 2024-02-14 ENCOUNTER — Ambulatory Visit: Admitting: Orthopedic Surgery

## 2024-02-16 ENCOUNTER — Encounter: Payer: Self-pay | Admitting: Adult Health

## 2024-02-16 ENCOUNTER — Encounter: Payer: Self-pay | Admitting: Nurse Practitioner

## 2024-02-17 ENCOUNTER — Ambulatory Visit: Admitting: Nurse Practitioner

## 2024-02-18 ENCOUNTER — Ambulatory Visit: Admitting: Adult Health

## 2024-02-24 ENCOUNTER — Ambulatory Visit (INDEPENDENT_AMBULATORY_CARE_PROVIDER_SITE_OTHER): Admitting: Nurse Practitioner

## 2024-02-24 ENCOUNTER — Encounter: Payer: Self-pay | Admitting: Nurse Practitioner

## 2024-02-24 VITALS — BP 122/76 | HR 96 | Temp 97.3°F | Ht 61.0 in | Wt 133.0 lb

## 2024-02-24 DIAGNOSIS — E782 Mixed hyperlipidemia: Secondary | ICD-10-CM | POA: Diagnosis not present

## 2024-02-24 DIAGNOSIS — R413 Other amnesia: Secondary | ICD-10-CM

## 2024-02-24 DIAGNOSIS — E039 Hypothyroidism, unspecified: Secondary | ICD-10-CM | POA: Diagnosis not present

## 2024-02-24 DIAGNOSIS — Z23 Encounter for immunization: Secondary | ICD-10-CM | POA: Diagnosis not present

## 2024-02-24 DIAGNOSIS — R600 Localized edema: Secondary | ICD-10-CM

## 2024-02-24 DIAGNOSIS — R739 Hyperglycemia, unspecified: Secondary | ICD-10-CM

## 2024-02-24 DIAGNOSIS — M7062 Trochanteric bursitis, left hip: Secondary | ICD-10-CM | POA: Diagnosis not present

## 2024-02-24 DIAGNOSIS — I25118 Atherosclerotic heart disease of native coronary artery with other forms of angina pectoris: Secondary | ICD-10-CM | POA: Diagnosis not present

## 2024-02-24 NOTE — Patient Instructions (Signed)
 Schedule medicare annual wellness

## 2024-02-24 NOTE — Progress Notes (Signed)
 "   Careteam: Patient Care Team: Caro Harlene POUR, NP as PCP - General (Geriatric Medicine) Hobart Powell BRAVO, MD (Inactive) as PCP - Cardiology (Cardiology) Vernetta Lonni GRADE, MD as Consulting Physician (Orthopedic Surgery)  PLACE OF SERVICE:  [facility]  Advanced Directive information    Allergies[1] -* Chief Complaint  Patient presents with   medication management of chronic issues    Patient presents today for 6 month follow up. Care gaps have been addressed.     HPI:  Discussed the use of AI scribe software for clinical note transcription with the patient, who gave verbal consent to proceed.  History of Present Illness Alice [name] is a 89 year old female who presents for a six-month follow-up.  She has a history of hip bursitis, which previously caused pain but has since improved without specific treatment. She reports no current pain and states that once she is in motion, everything seems fine. She has not been performing any exercises for the bursitis.  There is a concern about her memory. She forgets items at the grocery store and has not driven since her CABG surgery two years ago. She is not currently on any heart medications, only taking thyroid  medication. Her son notes an incident where she left items in the oven, causing a strong plastic smell, which she did not notice. She also reports that food does not taste good, and she has not been taking any specific vitamins regularly, only a B50 complex recently. Her son reports she is not forming new memories. Her grandchildren will visit her and then an hour later she will call and ask them why they have not visited.   She has experienced swelling in her left leg, which was noted to be significant by her caregiver, [place]. The swelling was more pronounced on the left side but has since reduced. No pain or recent trauma to the area is reported. She spends most of her time in bed. She does walk her dog.   She  frequently feels cold, which has been a long-standing issue. She spends a lot of time in bed and has been less active. Her weight has increased slightly since her last visit, she attributed to holiday eating.  Denies pain at this time   Review of Systems:  Review of Systems  Constitutional:  Negative for chills, fever and weight loss.  HENT:  Negative for tinnitus.   Respiratory:  Negative for cough, sputum production and shortness of breath.   Cardiovascular:  Negative for chest pain, palpitations and leg swelling.  Gastrointestinal:  Negative for abdominal pain, constipation, diarrhea and heartburn.  Genitourinary:  Negative for dysuria, frequency and urgency.  Musculoskeletal:  Negative for back pain, falls, joint pain and myalgias.  Skin: Negative.   Neurological:  Negative for dizziness and headaches.  Psychiatric/Behavioral:  Positive for memory loss. Negative for depression. The patient does not have insomnia.     Past Medical History:  Diagnosis Date   Alopecia    Breast cancer (HCC)     Right , chemo, radiation   DDD (degenerative disc disease)    Heart palpitations    History of diverticulitis    History of diverticulitis of colon    Hypercholesteremia    Hypothyroidism    Insect bites    Internal and external bleeding hemorrhoids 08/11/2015   Personal history of chemotherapy 2012   Personal history of radiation therapy 2012   Scoliosis    L4 L5   Seasonal allergies  SUI (stress urinary incontinence, female)    Past Surgical History:  Procedure Laterality Date   BLADDER SUSPENSION     bowel rescetion     BOWEL RESECTION     BREAST BIOPSY Right 03/14/2010   BREAST LUMPECTOMY  2012   right   CATARACT EXTRACTION     left   COLON SURGERY  20 YRS AGO   BOWEL RESECTION   CORONARY ARTERY BYPASS GRAFT N/A 03/19/2022   Procedure: CORONARY ARTERY BYPASS GRAFTING (CABG) X TWO, USING LEFT INTERNAL MAMMARY ARTERY AND RIGHT LEG GREATER SAPHENOUS VEIN HARVESTED  ENDOSCOPICALLY;  Surgeon: Kerrin Elspeth BROCKS, MD;  Location: [facility] OR;  Service: Open Heart Surgery;  Laterality: N/A;   EYE SURGERY     HEMORRHOID BANDING     HERNIA REPAIR     RIH, Dr. Lorriane   IABP INSERTION N/A 03/19/2022   Procedure: IABP Insertion;  Surgeon: Court Dorn PARAS, MD;  Location: [facility] INVASIVE CV LAB;  Service: Cardiovascular;  Laterality: N/A;   IR THORACENTESIS ASP PLEURAL SPACE W/IMG GUIDE  03/26/2022   JOINT REPLACEMENT     LEFT HEART CATH AND CORONARY ANGIOGRAPHY N/A 03/19/2022   Procedure: LEFT HEART CATH AND CORONARY ANGIOGRAPHY;  Surgeon: Court Dorn PARAS, MD;  Location: [facility] INVASIVE CV LAB;  Service: Cardiovascular;  Laterality: N/A;   PORT-A-CATH REMOVAL     TEE WITHOUT CARDIOVERSION N/A 03/19/2022   Procedure: TRANSESOPHAGEAL ECHOCARDIOGRAM;  Surgeon: Kerrin Elspeth BROCKS, MD;  Location: [facility] OR;  Service: Open Heart Surgery;  Laterality: N/A;   TONSILLECTOMY  2001   Diverticulitis   TOTAL HIP ARTHROPLASTY Right 09/10/2014   Procedure: RIGHT TOTAL HIP ARTHROPLASTY ANTERIOR APPROACH;  Surgeon: Lonni CINDERELLA Poli, MD;  Location: [facility] ORS;  Service: Orthopedics;  Laterality: Right;   Social History:   reports that she quit smoking about 59 years ago. Her smoking use included cigarettes. She has never used smokeless tobacco. She reports current alcohol use. She reports that she does not use drugs.  Family History  Problem Relation Age of Onset   Heart disease Mother    Heart disease Father    CVA Father 70   Cancer Sister 41       lymphoma   Asthma Son    Asthma Son    Asthma Son    Skin cancer Brother 21       melanoma   Colon cancer Other     Medications: Patient's Medications  New Prescriptions   No medications on file  Previous Medications   LEVOTHYROXINE  (SYNTHROID ) 100 MCG TABLET    Take 1 tablet (100 mcg total) by mouth daily.  Modified Medications   No medications on file  Discontinued Medications   No medications on file    Physical  Exam:  Vitals:   02/24/24 0925  BP: 122/76  Pulse: 96  Temp: (!) 97.3 F (36.3 C)  SpO2: 97%  Weight: 133 lb (60.3 kg)  Height: 5' 1 (1.549 m)   Body mass index is 25.13 kg/m. Wt Readings from Last 3 Encounters:  02/24/24 133 lb (60.3 kg)  08/12/23 128 lb 3.2 oz (58.2 kg)  02/05/23 128 lb 6.4 oz (58.2 kg)    Physical Exam Constitutional:      General: She is not in acute distress.    Appearance: She is well-developed. She is not diaphoretic.  HENT:     Head: Normocephalic and atraumatic.     Mouth/Throat:     Pharynx: No oropharyngeal exudate.  Eyes:  Conjunctiva/sclera: Conjunctivae normal.     Pupils: Pupils are equal, round, and reactive to light.  Cardiovascular:     Rate and Rhythm: Normal rate and regular rhythm.     Heart sounds: Normal heart sounds.  Pulmonary:     Effort: Pulmonary effort is normal.     Breath sounds: Normal breath sounds.  Abdominal:     General: Bowel sounds are normal.     Palpations: Abdomen is soft.  Musculoskeletal:     Cervical back: Normal range of motion and neck supple.     Right lower leg: No edema.     Left lower leg: No edema.  Skin:    General: Skin is warm and dry.  Neurological:     Mental Status: She is alert.     Motor: No weakness.     Gait: Gait normal.  Psychiatric:        Mood and Affect: Mood normal.     Labs reviewed: Basic Metabolic Panel: No results for input(s): NA, K, CL, CO2, GLUCOSE, BUN, CREATININE, CALCIUM , MG, PHOS, TSH in the last 8760 hours. Liver Function Tests: No results for input(s): AST, ALT, ALKPHOS, BILITOT, PROT, ALBUMIN  in the last 8760 hours. No results for input(s): LIPASE, AMYLASE in the last 8760 hours. No results for input(s): AMMONIA in the last 8760 hours. CBC: No results for input(s): WBC, NEUTROABS, HGB, HCT, MCV, PLT in the last 8760 hours. Lipid Panel: No results for input(s): CHOL, HDL, LDLCALC, TRIG,  CHOLHDL, LDLDIRECT in the last 8760 hours. TSH: No results for input(s): TSH in the last 8760 hours. A1C: Lab Results  Component Value Date   HGBA1C 5.9 (H) 01/14/2023     Assessment/Plan  Need for vaccination -     Flu vaccine HIGH DOSE PF(Fluzone Trivalent)   Assessment and Plan Assessment & Plan Memory loss Concerns about memory loss with potential contributing factors including acquired hypothyroidism and possible B12 deficiency. Differential includes vascular disease affecting the brain. - Ordered CT of the head to evaluate for structural causes. - Ordered blood work including B12 level, electrolytes, kidney function, liver function, and blood counts. -increase supervision -unplug stove and microwave for safety.   Coronary artery disease with history of myocardial infarction s/p CABG Coronary artery disease with myocardial infarction history. No current chest pain. Blood pressure well-controlled. Discussed cholesterol management to prevent further vascular events.  - Ordered cholesterol level. - Encouraged taking cholesterol medication.  Acquired hypothyroidism Managed with thyroid  medication. Plan to check thyroid  levels to ensure adequate control, which may impact memory function. - Ordered TSH level to assess thyroid  function.  Mixed hyperlipidemia History of mixed hyperlipidemia. Discussed cholesterol management to prevent further vascular events.  - Ordered cholesterol level. - Encouraged taking cholesterol medication.  Trochanteric bursitis of left hip Condition improved significantly with no current pain or mobility issues.  Hyperglycemia -A1c today  General health maintenance Received flu shot. Plans for shingles and COVID vaccinations. Discussed regular dental care and potential need for dentures. - Follow up with shingles and COVID vaccinations. - Encouraged regular dental care and consideration of dentures.   Return in about 3 months (around  05/24/2024).  Alice [name] [facility] [phone]     [1]  Allergies Allergen Reactions   Amoxicillin  Rash   Diphenhydramine  Rash    It is the red dye in Benadryl  that I''m allergic to   Naproxen Sodium Rash    It is the blue dye in naproxen that I'm  allergic to.   Lipitor [Atorvastatin]     Unknown reaction   Meclizine  Other (See Comments)    Worsening dizziness and blurred vision   Amlodipine  Other (See Comments)    Dizzy   Cefdinir Other (See Comments)    Vertigo and headaches    Codeine  Other (See Comments)    unknown   Ferrous Sulfate     tears belly up   Methocarbamol  Other (See Comments)    insomnia    Prednisone Other (See Comments)    vertigo and headaches   Skelaxin [Metaxalone] Other (See Comments)    Gastric upset.    Tramadol  Nausea Only   Hctz [Hydrochlorothiazide] Itching   "

## 2024-02-25 ENCOUNTER — Ambulatory Visit: Payer: Self-pay | Admitting: Nurse Practitioner

## 2024-02-25 ENCOUNTER — Other Ambulatory Visit: Payer: Self-pay | Admitting: Nurse Practitioner

## 2024-02-25 ENCOUNTER — Ambulatory Visit (HOSPITAL_COMMUNITY)
Admission: RE | Admit: 2024-02-25 | Discharge: 2024-02-25 | Disposition: A | Discharge status: Home / Self Care | Source: Ambulatory Visit | Attending: Nurse Practitioner | Admitting: Nurse Practitioner

## 2024-02-25 DIAGNOSIS — R5381 Other malaise: Secondary | ICD-10-CM

## 2024-02-25 DIAGNOSIS — R600 Localized edema: Secondary | ICD-10-CM | POA: Insufficient documentation

## 2024-02-25 DIAGNOSIS — E039 Hypothyroidism, unspecified: Secondary | ICD-10-CM

## 2024-02-25 DIAGNOSIS — M7062 Trochanteric bursitis, left hip: Secondary | ICD-10-CM

## 2024-02-25 DIAGNOSIS — I8290 Acute embolism and thrombosis of unspecified vein: Secondary | ICD-10-CM

## 2024-02-25 LAB — HEMOGLOBIN A1C
Hgb A1c MFr Bld: 5.8 % — ABNORMAL HIGH
Mean Plasma Glucose: 120 mg/dL
eAG (mmol/L): 6.6 mmol/L

## 2024-02-25 LAB — COMPREHENSIVE METABOLIC PANEL WITH GFR
AG Ratio: 1.7 (calc) (ref 1.0–2.5)
ALT: 13 U/L (ref 6–29)
AST: 15 U/L (ref 10–35)
Albumin: 4.3 g/dL (ref 3.6–5.1)
Alkaline phosphatase (APISO): 56 U/L (ref 37–153)
BUN: 23 mg/dL (ref 7–25)
CO2: 30 mmol/L (ref 20–32)
Calcium: 9.5 mg/dL (ref 8.6–10.4)
Chloride: 102 mmol/L (ref 98–110)
Creat: 0.77 mg/dL (ref 0.60–0.95)
Globulin: 2.5 g/dL (ref 1.9–3.7)
Glucose, Bld: 102 mg/dL — ABNORMAL HIGH (ref 65–99)
Potassium: 4.4 mmol/L (ref 3.5–5.3)
Sodium: 142 mmol/L (ref 135–146)
Total Bilirubin: 0.5 mg/dL (ref 0.2–1.2)
Total Protein: 6.8 g/dL (ref 6.1–8.1)
eGFR: 73 mL/min/1.73m2

## 2024-02-25 LAB — CBC WITH DIFFERENTIAL/PLATELET
Absolute Lymphocytes: 3485 {cells}/uL (ref 850–3900)
Absolute Monocytes: 730 {cells}/uL (ref 200–950)
Basophils Absolute: 62 {cells}/uL (ref 0–200)
Basophils Relative: 0.7 %
Eosinophils Absolute: 220 {cells}/uL (ref 15–500)
Eosinophils Relative: 2.5 %
HCT: 43 % (ref 35.9–46.0)
Hemoglobin: 13.8 g/dL (ref 11.7–15.5)
MCH: 28.9 pg (ref 27.0–33.0)
MCHC: 32.1 g/dL (ref 31.6–35.4)
MCV: 90.1 fL (ref 81.4–101.7)
MPV: 10.8 fL (ref 7.5–12.5)
Monocytes Relative: 8.3 %
Neutro Abs: 4303 {cells}/uL (ref 1500–7800)
Neutrophils Relative %: 48.9 %
Platelets: 266 Thousand/uL (ref 140–400)
RBC: 4.77 Million/uL (ref 3.80–5.10)
RDW: 14 % (ref 11.0–15.0)
Total Lymphocyte: 39.6 %
WBC: 8.8 Thousand/uL (ref 3.8–10.8)

## 2024-02-25 LAB — VITAMIN B12: Vitamin B-12: 453 pg/mL (ref 200–1100)

## 2024-02-25 LAB — LIPID PANEL
Cholesterol: 231 mg/dL — ABNORMAL HIGH
HDL: 88 mg/dL
LDL Cholesterol (Calc): 127 mg/dL — ABNORMAL HIGH
Non-HDL Cholesterol (Calc): 143 mg/dL — ABNORMAL HIGH
Total CHOL/HDL Ratio: 2.6 (calc)
Triglycerides: 68 mg/dL

## 2024-02-25 LAB — TSH: TSH: 0.32 m[IU]/L — ABNORMAL LOW (ref 0.40–4.50)

## 2024-02-25 MED ORDER — LEVOTHYROXINE SODIUM 88 MCG PO TABS: 88.0000 ug | ORAL_TABLET | Freq: Every day | ORAL | 3 refills | Status: AC

## 2024-02-25 MED ORDER — RIVAROXABAN 20 MG PO TABS: 20.0000 mg | ORAL_TABLET | Freq: Every day | ORAL | 0 refills | Status: AC

## 2024-02-25 MED ORDER — RIVAROXABAN 15 MG PO TABS: 15.0000 mg | ORAL_TABLET | Freq: Two times a day (BID) | ORAL | 0 refills | Status: DC

## 2024-02-26 NOTE — Telephone Encounter (Signed)
 Please advise on started pack sig and directions.

## 2024-03-02 ENCOUNTER — Telehealth: Payer: Self-pay | Admitting: *Deleted

## 2024-03-02 ENCOUNTER — Ambulatory Visit (HOSPITAL_COMMUNITY)

## 2024-03-02 NOTE — Telephone Encounter (Signed)
 Copied from CRM [id number]. Topic: Referral - Question >> Mar 02, 2024 12:52 PM DeAngela L wrote: Reason for CRM: Son Krystal calling cause the Patients care giver is not available for the next 30 days and would like to ask if the provider can submit a referral for a home health company   the family has looked into a home health service to help her get in home help and her insurance states they can cover this short term and if its medically necessary and he states this is needed cause she needs help with medication and regular blood pressure checks    Cathlyn [phone] try this son 1st  Krystal [phone] this son 2nd

## 2024-03-02 NOTE — Telephone Encounter (Signed)
 Home health referral is for things like physical therapy or occupational therapy and do not help with routine care/medication. He will need to call insurance and have them send us  over paperwork for in home care with a personal care assistant if her insurance covers that. Again they will have to call the insurance to send us  over forms.  He can also look into local companies that provide care as well but most of the time this is out of pocket.

## 2024-03-02 NOTE — Telephone Encounter (Signed)
 Called and spoke with Cathlyn Blades, He stated that he will give the insurance a call.

## 2024-03-02 NOTE — Telephone Encounter (Signed)
 See below

## 2024-03-09 ENCOUNTER — Ambulatory Visit: Admitting: Nurse Practitioner

## 2024-03-17 ENCOUNTER — Encounter: Payer: Self-pay | Admitting: Adult Health

## 2024-03-20 ENCOUNTER — Ambulatory Visit: Admitting: Nurse Practitioner

## 2024-05-25 ENCOUNTER — Ambulatory Visit: Admitting: Nurse Practitioner
